# Patient Record
Sex: Female | Born: 1945 | Race: White | Hispanic: No | Marital: Married | Smoking: Former smoker
Health system: Southern US, Community
[De-identification: ages and names within clinical notes are randomized; demographics above are authoritative.]

## PROBLEM LIST (undated history)

## (undated) DIAGNOSIS — Z8711 Personal history of peptic ulcer disease: Secondary | ICD-10-CM

## (undated) DIAGNOSIS — I499 Cardiac arrhythmia, unspecified: Secondary | ICD-10-CM

## (undated) DIAGNOSIS — M1990 Unspecified osteoarthritis, unspecified site: Secondary | ICD-10-CM

## (undated) DIAGNOSIS — C210 Malignant neoplasm of anus, unspecified: Secondary | ICD-10-CM

## (undated) DIAGNOSIS — M8580 Other specified disorders of bone density and structure, unspecified site: Secondary | ICD-10-CM

## (undated) DIAGNOSIS — F419 Anxiety disorder, unspecified: Secondary | ICD-10-CM

## (undated) DIAGNOSIS — Z8719 Personal history of other diseases of the digestive system: Secondary | ICD-10-CM

## (undated) DIAGNOSIS — S82899A Other fracture of unspecified lower leg, initial encounter for closed fracture: Secondary | ICD-10-CM

## (undated) DIAGNOSIS — J449 Chronic obstructive pulmonary disease, unspecified: Secondary | ICD-10-CM

## (undated) DIAGNOSIS — W19XXXA Unspecified fall, initial encounter: Secondary | ICD-10-CM

## (undated) DIAGNOSIS — W5503XA Scratched by cat, initial encounter: Secondary | ICD-10-CM

## (undated) DIAGNOSIS — E7800 Pure hypercholesterolemia, unspecified: Secondary | ICD-10-CM

## (undated) DIAGNOSIS — R0600 Dyspnea, unspecified: Secondary | ICD-10-CM

## (undated) DIAGNOSIS — I4891 Unspecified atrial fibrillation: Secondary | ICD-10-CM

## (undated) DIAGNOSIS — S60511A Abrasion of right hand, initial encounter: Secondary | ICD-10-CM

## (undated) HISTORY — PX: VARICOSE VEIN SURGERY: SHX832

## (undated) HISTORY — DX: Malignant neoplasm of anus, unspecified: C21.0

## (undated) HISTORY — DX: Unspecified fall, initial encounter: W19.XXXA

## (undated) HISTORY — DX: Other specified disorders of bone density and structure, unspecified site: M85.80

## (undated) HISTORY — PX: DILATION AND CURETTAGE OF UTERUS: SHX78

---
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
## 1964-11-14 HISTORY — PX: BREAST CYST EXCISION: SHX579

## 1998-10-19 ENCOUNTER — Other Ambulatory Visit: Admission: RE | Admit: 1998-10-19 | Discharge: 1998-10-19 | Payer: Self-pay | Admitting: *Deleted

## 1999-10-06 ENCOUNTER — Encounter: Payer: Self-pay | Admitting: *Deleted

## 1999-10-06 ENCOUNTER — Encounter: Admission: RE | Admit: 1999-10-06 | Discharge: 1999-10-06 | Payer: Self-pay | Admitting: *Deleted

## 1999-11-24 ENCOUNTER — Other Ambulatory Visit: Admission: RE | Admit: 1999-11-24 | Discharge: 1999-11-24 | Payer: Self-pay | Admitting: *Deleted

## 2000-10-09 ENCOUNTER — Encounter: Payer: Self-pay | Admitting: *Deleted

## 2000-10-09 ENCOUNTER — Encounter: Admission: RE | Admit: 2000-10-09 | Discharge: 2000-10-09 | Payer: Self-pay | Admitting: *Deleted

## 2000-12-06 ENCOUNTER — Other Ambulatory Visit: Admission: RE | Admit: 2000-12-06 | Discharge: 2000-12-06 | Payer: Self-pay | Admitting: *Deleted

## 2001-08-08 ENCOUNTER — Ambulatory Visit (HOSPITAL_COMMUNITY): Admission: RE | Admit: 2001-08-08 | Discharge: 2001-08-08 | Payer: Self-pay | Admitting: Gastroenterology

## 2001-08-08 ENCOUNTER — Encounter (INDEPENDENT_AMBULATORY_CARE_PROVIDER_SITE_OTHER): Payer: Self-pay | Admitting: *Deleted

## 2001-11-21 ENCOUNTER — Encounter: Admission: RE | Admit: 2001-11-21 | Discharge: 2001-11-21 | Payer: Self-pay | Admitting: *Deleted

## 2001-11-21 ENCOUNTER — Encounter: Payer: Self-pay | Admitting: *Deleted

## 2001-12-26 ENCOUNTER — Other Ambulatory Visit: Admission: RE | Admit: 2001-12-26 | Discharge: 2001-12-26 | Payer: Self-pay | Admitting: *Deleted

## 2002-12-04 ENCOUNTER — Encounter: Payer: Self-pay | Admitting: *Deleted

## 2002-12-04 ENCOUNTER — Encounter: Admission: RE | Admit: 2002-12-04 | Discharge: 2002-12-04 | Payer: Self-pay | Admitting: *Deleted

## 2003-01-01 ENCOUNTER — Other Ambulatory Visit: Admission: RE | Admit: 2003-01-01 | Discharge: 2003-01-01 | Payer: Self-pay | Admitting: *Deleted

## 2003-11-15 DIAGNOSIS — C210 Malignant neoplasm of anus, unspecified: Secondary | ICD-10-CM

## 2003-11-15 HISTORY — DX: Malignant neoplasm of anus, unspecified: C21.0

## 2003-11-15 HISTORY — PX: ANUS SURGERY: SHX302

## 2004-02-07 ENCOUNTER — Ambulatory Visit (HOSPITAL_COMMUNITY): Admission: RE | Admit: 2004-02-07 | Discharge: 2004-02-07 | Payer: Self-pay | Admitting: Surgery

## 2004-02-08 ENCOUNTER — Encounter (INDEPENDENT_AMBULATORY_CARE_PROVIDER_SITE_OTHER): Payer: Self-pay | Admitting: Specialist

## 2004-02-09 ENCOUNTER — Ambulatory Visit: Admission: RE | Admit: 2004-02-09 | Discharge: 2004-04-16 | Payer: Self-pay | Admitting: Radiation Oncology

## 2004-02-09 ENCOUNTER — Encounter: Admission: RE | Admit: 2004-02-09 | Discharge: 2004-02-09 | Payer: Self-pay | Admitting: Surgery

## 2004-02-17 ENCOUNTER — Ambulatory Visit (HOSPITAL_COMMUNITY): Admission: RE | Admit: 2004-02-17 | Discharge: 2004-02-17 | Payer: Self-pay | Admitting: Oncology

## 2004-02-21 ENCOUNTER — Emergency Department (HOSPITAL_COMMUNITY): Admission: EM | Admit: 2004-02-21 | Discharge: 2004-02-21 | Payer: Self-pay | Admitting: Emergency Medicine

## 2004-02-23 ENCOUNTER — Ambulatory Visit (HOSPITAL_COMMUNITY): Admission: RE | Admit: 2004-02-23 | Discharge: 2004-02-23 | Payer: Self-pay | Admitting: Oncology

## 2004-03-15 ENCOUNTER — Ambulatory Visit (HOSPITAL_COMMUNITY): Admission: RE | Admit: 2004-03-15 | Discharge: 2004-03-15 | Payer: Self-pay | Admitting: Oncology

## 2004-03-16 ENCOUNTER — Inpatient Hospital Stay (HOSPITAL_COMMUNITY): Admission: EM | Admit: 2004-03-16 | Discharge: 2004-03-25 | Payer: Self-pay | Admitting: Oncology

## 2004-03-23 ENCOUNTER — Encounter (INDEPENDENT_AMBULATORY_CARE_PROVIDER_SITE_OTHER): Payer: Self-pay | Admitting: *Deleted

## 2004-05-11 ENCOUNTER — Ambulatory Visit: Admission: RE | Admit: 2004-05-11 | Discharge: 2004-05-11 | Payer: Self-pay | Admitting: Radiation Oncology

## 2004-05-21 ENCOUNTER — Encounter: Admission: RE | Admit: 2004-05-21 | Discharge: 2004-05-21 | Payer: Self-pay | Admitting: *Deleted

## 2004-06-22 ENCOUNTER — Ambulatory Visit (HOSPITAL_COMMUNITY): Admission: RE | Admit: 2004-06-22 | Discharge: 2004-06-22 | Payer: Self-pay | Admitting: Oncology

## 2004-06-25 ENCOUNTER — Ambulatory Visit (HOSPITAL_COMMUNITY): Admission: RE | Admit: 2004-06-25 | Discharge: 2004-06-25 | Payer: Self-pay | Admitting: Oncology

## 2004-07-07 ENCOUNTER — Other Ambulatory Visit: Admission: RE | Admit: 2004-07-07 | Discharge: 2004-07-07 | Payer: Self-pay | Admitting: *Deleted

## 2004-07-13 ENCOUNTER — Ambulatory Visit: Admission: RE | Admit: 2004-07-13 | Discharge: 2004-07-13 | Payer: Self-pay | Admitting: Radiation Oncology

## 2004-09-20 ENCOUNTER — Ambulatory Visit (HOSPITAL_COMMUNITY): Admission: RE | Admit: 2004-09-20 | Discharge: 2004-09-20 | Payer: Self-pay | Admitting: Oncology

## 2004-10-17 ENCOUNTER — Ambulatory Visit: Payer: Self-pay | Admitting: Oncology

## 2004-11-17 ENCOUNTER — Ambulatory Visit: Admission: RE | Admit: 2004-11-17 | Discharge: 2004-11-17 | Payer: Self-pay | Admitting: *Deleted

## 2005-01-17 ENCOUNTER — Ambulatory Visit: Payer: Self-pay | Admitting: Oncology

## 2005-03-23 ENCOUNTER — Ambulatory Visit: Admission: RE | Admit: 2005-03-23 | Discharge: 2005-03-23 | Payer: Self-pay | Admitting: *Deleted

## 2005-04-04 ENCOUNTER — Ambulatory Visit: Payer: Self-pay | Admitting: Oncology

## 2005-04-07 ENCOUNTER — Ambulatory Visit (HOSPITAL_COMMUNITY): Admission: RE | Admit: 2005-04-07 | Discharge: 2005-04-07 | Payer: Self-pay | Admitting: Oncology

## 2005-06-08 ENCOUNTER — Encounter: Admission: RE | Admit: 2005-06-08 | Discharge: 2005-06-08 | Payer: Self-pay | Admitting: Emergency Medicine

## 2005-07-05 ENCOUNTER — Ambulatory Visit: Payer: Self-pay | Admitting: Oncology

## 2005-07-08 ENCOUNTER — Other Ambulatory Visit: Admission: RE | Admit: 2005-07-08 | Discharge: 2005-07-08 | Payer: Self-pay | Admitting: *Deleted

## 2006-03-07 ENCOUNTER — Ambulatory Visit: Payer: Self-pay | Admitting: Oncology

## 2006-03-07 LAB — COMPREHENSIVE METABOLIC PANEL
ALT: 11 U/L (ref 0–40)
AST: 20 U/L (ref 0–37)
Albumin: 4.2 g/dL (ref 3.5–5.2)
Alkaline Phosphatase: 37 U/L — ABNORMAL LOW (ref 39–117)
BUN: 15 mg/dL (ref 6–23)
CO2: 25 mEq/L (ref 19–32)
Calcium: 8.9 mg/dL (ref 8.4–10.5)
Chloride: 106 mEq/L (ref 96–112)
Creatinine, Ser: 0.7 mg/dL (ref 0.4–1.2)
Glucose, Bld: 100 mg/dL — ABNORMAL HIGH (ref 70–99)
Potassium: 4.1 mEq/L (ref 3.5–5.3)
Sodium: 139 mEq/L (ref 135–145)
Total Bilirubin: 0.4 mg/dL (ref 0.3–1.2)
Total Protein: 6.6 g/dL (ref 6.0–8.3)

## 2006-03-07 LAB — CBC WITH DIFFERENTIAL/PLATELET
BASO%: 0.4 % (ref 0.0–2.0)
Basophils Absolute: 0 10*3/uL (ref 0.0–0.1)
EOS%: 2.3 % (ref 0.0–7.0)
Eosinophils Absolute: 0.2 10*3/uL (ref 0.0–0.5)
HCT: 39.3 % (ref 34.8–46.6)
HGB: 12.9 g/dL (ref 11.6–15.9)
LYMPH%: 11.5 % — ABNORMAL LOW (ref 14.0–48.0)
MCH: 27.7 pg (ref 26.0–34.0)
MCHC: 32.9 g/dL (ref 32.0–36.0)
MCV: 84.1 fL (ref 81.0–101.0)
MONO#: 0.4 10*3/uL (ref 0.1–0.9)
MONO%: 6.3 % (ref 0.0–13.0)
NEUT#: 5.3 10*3/uL (ref 1.5–6.5)
NEUT%: 79.5 % — ABNORMAL HIGH (ref 39.6–76.8)
Platelets: 366 10*3/uL (ref 145–400)
RBC: 4.68 10*6/uL (ref 3.70–5.32)
RDW: 15.4 % — ABNORMAL HIGH (ref 11.3–14.5)
WBC: 6.7 10*3/uL (ref 3.9–10.0)
lymph#: 0.8 10*3/uL — ABNORMAL LOW (ref 0.9–3.3)

## 2006-03-09 ENCOUNTER — Ambulatory Visit (HOSPITAL_COMMUNITY): Admission: RE | Admit: 2006-03-09 | Discharge: 2006-03-09 | Payer: Self-pay | Admitting: Oncology

## 2006-06-30 ENCOUNTER — Encounter: Admission: RE | Admit: 2006-06-30 | Discharge: 2006-06-30 | Payer: Self-pay | Admitting: Obstetrics & Gynecology

## 2006-07-12 ENCOUNTER — Other Ambulatory Visit: Admission: RE | Admit: 2006-07-12 | Discharge: 2006-07-12 | Payer: Self-pay | Admitting: Obstetrics & Gynecology

## 2006-09-22 ENCOUNTER — Ambulatory Visit: Payer: Self-pay | Admitting: Oncology

## 2006-09-26 LAB — CBC WITH DIFFERENTIAL/PLATELET
BASO%: 0.5 % (ref 0.0–2.0)
Basophils Absolute: 0 10*3/uL (ref 0.0–0.1)
EOS%: 0.8 % (ref 0.0–7.0)
Eosinophils Absolute: 0.1 10*3/uL (ref 0.0–0.5)
HCT: 37 % (ref 34.8–46.6)
HGB: 12.4 g/dL (ref 11.6–15.9)
LYMPH%: 16.2 % (ref 14.0–48.0)
MCH: 28.1 pg (ref 26.0–34.0)
MCHC: 33.4 g/dL (ref 32.0–36.0)
MCV: 84.1 fL (ref 81.0–101.0)
MONO#: 0.4 10*3/uL (ref 0.1–0.9)
MONO%: 5.8 % (ref 0.0–13.0)
NEUT#: 5.5 10*3/uL (ref 1.5–6.5)
NEUT%: 76.7 % (ref 39.6–76.8)
Platelets: 356 10*3/uL (ref 145–400)
RBC: 4.4 10*6/uL (ref 3.70–5.32)
RDW: 14.4 % (ref 11.3–14.5)
WBC: 7.2 10*3/uL (ref 3.9–10.0)
lymph#: 1.2 10*3/uL (ref 0.9–3.3)

## 2006-09-26 LAB — COMPREHENSIVE METABOLIC PANEL
ALT: 9 U/L (ref 0–35)
AST: 17 U/L (ref 0–37)
Albumin: 4.1 g/dL (ref 3.5–5.2)
Alkaline Phosphatase: 38 U/L — ABNORMAL LOW (ref 39–117)
BUN: 12 mg/dL (ref 6–23)
CO2: 26 mEq/L (ref 19–32)
Calcium: 9.2 mg/dL (ref 8.4–10.5)
Chloride: 107 mEq/L (ref 96–112)
Creatinine, Ser: 0.73 mg/dL (ref 0.40–1.20)
Glucose, Bld: 100 mg/dL — ABNORMAL HIGH (ref 70–99)
Potassium: 3.8 mEq/L (ref 3.5–5.3)
Sodium: 143 mEq/L (ref 135–145)
Total Bilirubin: 0.4 mg/dL (ref 0.3–1.2)
Total Protein: 6.7 g/dL (ref 6.0–8.3)

## 2007-03-23 ENCOUNTER — Ambulatory Visit: Payer: Self-pay | Admitting: Oncology

## 2007-03-27 LAB — CBC WITH DIFFERENTIAL/PLATELET
BASO%: 0.4 % (ref 0.0–2.0)
Basophils Absolute: 0 10*3/uL (ref 0.0–0.1)
EOS%: 1.9 % (ref 0.0–7.0)
Eosinophils Absolute: 0.1 10*3/uL (ref 0.0–0.5)
HCT: 40 % (ref 34.8–46.6)
HGB: 13.5 g/dL (ref 11.6–15.9)
LYMPH%: 16.1 % (ref 14.0–48.0)
MCH: 27.8 pg (ref 26.0–34.0)
MCHC: 33.8 g/dL (ref 32.0–36.0)
MCV: 82.1 fL (ref 81.0–101.0)
MONO#: 0.4 10*3/uL (ref 0.1–0.9)
MONO%: 6.4 % (ref 0.0–13.0)
NEUT#: 4.4 10*3/uL (ref 1.5–6.5)
NEUT%: 75.2 % (ref 39.6–76.8)
Platelets: 326 10*3/uL (ref 145–400)
RBC: 4.87 10*6/uL (ref 3.70–5.32)
RDW: 13.9 % (ref 11.3–14.5)
WBC: 5.8 10*3/uL (ref 3.9–10.0)
lymph#: 0.9 10*3/uL (ref 0.9–3.3)

## 2007-03-27 LAB — COMPREHENSIVE METABOLIC PANEL
ALT: 12 U/L (ref 0–35)
AST: 18 U/L (ref 0–37)
Albumin: 4.4 g/dL (ref 3.5–5.2)
Alkaline Phosphatase: 41 U/L (ref 39–117)
BUN: 13 mg/dL (ref 6–23)
CO2: 26 mEq/L (ref 19–32)
Calcium: 9.6 mg/dL (ref 8.4–10.5)
Chloride: 104 mEq/L (ref 96–112)
Creatinine, Ser: 0.78 mg/dL (ref 0.40–1.20)
Glucose, Bld: 76 mg/dL (ref 70–99)
Potassium: 4.8 mEq/L (ref 3.5–5.3)
Sodium: 140 mEq/L (ref 135–145)
Total Bilirubin: 0.5 mg/dL (ref 0.3–1.2)
Total Protein: 7 g/dL (ref 6.0–8.3)

## 2007-07-25 ENCOUNTER — Encounter: Admission: RE | Admit: 2007-07-25 | Discharge: 2007-07-25 | Payer: Self-pay | Admitting: Gastroenterology

## 2007-09-19 ENCOUNTER — Encounter: Admission: RE | Admit: 2007-09-19 | Discharge: 2007-09-19 | Payer: Self-pay | Admitting: Emergency Medicine

## 2007-09-21 ENCOUNTER — Ambulatory Visit: Payer: Self-pay | Admitting: Oncology

## 2007-09-25 LAB — COMPREHENSIVE METABOLIC PANEL
ALT: 15 U/L (ref 0–35)
AST: 24 U/L (ref 0–37)
Albumin: 3.9 g/dL (ref 3.5–5.2)
Alkaline Phosphatase: 46 U/L (ref 39–117)
BUN: 12 mg/dL (ref 6–23)
CO2: 27 mEq/L (ref 19–32)
Calcium: 9.3 mg/dL (ref 8.4–10.5)
Chloride: 105 mEq/L (ref 96–112)
Creatinine, Ser: 0.71 mg/dL (ref 0.40–1.20)
Glucose, Bld: 148 mg/dL — ABNORMAL HIGH (ref 70–99)
Potassium: 3.6 mEq/L (ref 3.5–5.3)
Sodium: 140 mEq/L (ref 135–145)
Total Bilirubin: 0.8 mg/dL (ref 0.3–1.2)
Total Protein: 6.8 g/dL (ref 6.0–8.3)

## 2007-09-25 LAB — CBC WITH DIFFERENTIAL/PLATELET
BASO%: 0.3 % (ref 0.0–2.0)
Basophils Absolute: 0 10*3/uL (ref 0.0–0.1)
EOS%: 0.7 % (ref 0.0–7.0)
Eosinophils Absolute: 0.1 10*3/uL (ref 0.0–0.5)
HCT: 37.4 % (ref 34.8–46.6)
HGB: 12.8 g/dL (ref 11.6–15.9)
LYMPH%: 16.6 % (ref 14.0–48.0)
MCH: 28.1 pg (ref 26.0–34.0)
MCHC: 34.3 g/dL (ref 32.0–36.0)
MCV: 81.8 fL (ref 81.0–101.0)
MONO#: 0.3 10*3/uL (ref 0.1–0.9)
MONO%: 4.1 % (ref 0.0–13.0)
NEUT#: 5.6 10*3/uL (ref 1.5–6.5)
NEUT%: 78.3 % — ABNORMAL HIGH (ref 39.6–76.8)
Platelets: 341 10*3/uL (ref 145–400)
RBC: 4.57 10*6/uL (ref 3.70–5.32)
RDW: 14.2 % (ref 11.3–14.5)
WBC: 7.1 10*3/uL (ref 3.9–10.0)
lymph#: 1.2 10*3/uL (ref 0.9–3.3)

## 2007-10-09 ENCOUNTER — Other Ambulatory Visit: Admission: RE | Admit: 2007-10-09 | Discharge: 2007-10-09 | Payer: Self-pay | Admitting: Obstetrics & Gynecology

## 2008-03-20 ENCOUNTER — Ambulatory Visit: Payer: Self-pay | Admitting: Oncology

## 2008-03-24 LAB — CBC WITH DIFFERENTIAL/PLATELET
BASO%: 0.1 % (ref 0.0–2.0)
Basophils Absolute: 0 10*3/uL (ref 0.0–0.1)
EOS%: 1.1 % (ref 0.0–7.0)
Eosinophils Absolute: 0.1 10*3/uL (ref 0.0–0.5)
HCT: 38.8 % (ref 34.8–46.6)
HGB: 13.1 g/dL (ref 11.6–15.9)
LYMPH%: 13.3 % — ABNORMAL LOW (ref 14.0–48.0)
MCH: 27.7 pg (ref 26.0–34.0)
MCHC: 33.8 g/dL (ref 32.0–36.0)
MCV: 82 fL (ref 81.0–101.0)
MONO#: 0.4 10*3/uL (ref 0.1–0.9)
MONO%: 6 % (ref 0.0–13.0)
NEUT#: 5.5 10*3/uL (ref 1.5–6.5)
NEUT%: 79.5 % — ABNORMAL HIGH (ref 39.6–76.8)
Platelets: 337 10*3/uL (ref 145–400)
RBC: 4.73 10*6/uL (ref 3.70–5.32)
RDW: 14 % (ref 11.3–14.5)
WBC: 6.9 10*3/uL (ref 3.9–10.0)
lymph#: 0.9 10*3/uL (ref 0.9–3.3)

## 2008-03-24 LAB — COMPREHENSIVE METABOLIC PANEL
ALT: 9 U/L (ref 0–35)
AST: 14 U/L (ref 0–37)
Albumin: 4.2 g/dL (ref 3.5–5.2)
Alkaline Phosphatase: 40 U/L (ref 39–117)
BUN: 18 mg/dL (ref 6–23)
CO2: 24 mEq/L (ref 19–32)
Calcium: 9.2 mg/dL (ref 8.4–10.5)
Chloride: 105 mEq/L (ref 96–112)
Creatinine, Ser: 0.91 mg/dL (ref 0.40–1.20)
Glucose, Bld: 86 mg/dL (ref 70–99)
Potassium: 4.2 mEq/L (ref 3.5–5.3)
Sodium: 140 mEq/L (ref 135–145)
Total Bilirubin: 0.5 mg/dL (ref 0.3–1.2)
Total Protein: 6.9 g/dL (ref 6.0–8.3)

## 2008-08-22 ENCOUNTER — Encounter: Admission: RE | Admit: 2008-08-22 | Discharge: 2008-08-22 | Payer: Self-pay | Admitting: Oncology

## 2008-09-22 ENCOUNTER — Ambulatory Visit: Payer: Self-pay | Admitting: Oncology

## 2008-09-24 LAB — COMPREHENSIVE METABOLIC PANEL
ALT: 13 U/L (ref 0–35)
AST: 20 U/L (ref 0–37)
Albumin: 3.9 g/dL (ref 3.5–5.2)
Alkaline Phosphatase: 41 U/L (ref 39–117)
BUN: 17 mg/dL (ref 6–23)
CO2: 27 mEq/L (ref 19–32)
Calcium: 9.4 mg/dL (ref 8.4–10.5)
Chloride: 103 mEq/L (ref 96–112)
Creatinine, Ser: 0.85 mg/dL (ref 0.40–1.20)
Glucose, Bld: 100 mg/dL — ABNORMAL HIGH (ref 70–99)
Potassium: 3.9 mEq/L (ref 3.5–5.3)
Sodium: 137 mEq/L (ref 135–145)
Total Bilirubin: 0.8 mg/dL (ref 0.3–1.2)
Total Protein: 6.9 g/dL (ref 6.0–8.3)

## 2008-09-24 LAB — CBC WITH DIFFERENTIAL/PLATELET
BASO%: 0.2 % (ref 0.0–2.0)
Basophils Absolute: 0 10*3/uL (ref 0.0–0.1)
EOS%: 0.8 % (ref 0.0–7.0)
Eosinophils Absolute: 0.1 10*3/uL (ref 0.0–0.5)
HCT: 40.3 % (ref 34.8–46.6)
HGB: 13.6 g/dL (ref 11.6–15.9)
LYMPH%: 12.9 % — ABNORMAL LOW (ref 14.0–48.0)
MCH: 27.9 pg (ref 26.0–34.0)
MCHC: 33.7 g/dL (ref 32.0–36.0)
MCV: 82.9 fL (ref 81.0–101.0)
MONO#: 0.4 10*3/uL (ref 0.1–0.9)
MONO%: 4.4 % (ref 0.0–13.0)
NEUT#: 6.6 10*3/uL — ABNORMAL HIGH (ref 1.5–6.5)
NEUT%: 81.7 % — ABNORMAL HIGH (ref 39.6–76.8)
Platelets: 381 10*3/uL (ref 145–400)
RBC: 4.86 10*6/uL (ref 3.70–5.32)
RDW: 14 % (ref 11.3–14.5)
WBC: 8.1 10*3/uL (ref 3.9–10.0)
lymph#: 1 10*3/uL (ref 0.9–3.3)

## 2008-10-15 ENCOUNTER — Other Ambulatory Visit: Admission: RE | Admit: 2008-10-15 | Discharge: 2008-10-15 | Payer: Self-pay | Admitting: Obstetrics & Gynecology

## 2009-03-20 ENCOUNTER — Ambulatory Visit: Payer: Self-pay | Admitting: Oncology

## 2009-03-24 LAB — CBC WITH DIFFERENTIAL/PLATELET
BASO%: 0.3 % (ref 0.0–2.0)
Basophils Absolute: 0 10*3/uL (ref 0.0–0.1)
EOS%: 1.1 % (ref 0.0–7.0)
Eosinophils Absolute: 0.1 10*3/uL (ref 0.0–0.5)
HCT: 38.8 % (ref 34.8–46.6)
HGB: 13 g/dL (ref 11.6–15.9)
LYMPH%: 17.3 % (ref 14.0–49.7)
MCH: 27.6 pg (ref 25.1–34.0)
MCHC: 33.5 g/dL (ref 31.5–36.0)
MCV: 82.4 fL (ref 79.5–101.0)
MONO#: 0.3 10*3/uL (ref 0.1–0.9)
MONO%: 5.1 % (ref 0.0–14.0)
NEUT#: 4.4 10*3/uL (ref 1.5–6.5)
NEUT%: 76.2 % (ref 38.4–76.8)
Platelets: 317 10*3/uL (ref 145–400)
RBC: 4.7 10*6/uL (ref 3.70–5.45)
RDW: 13.8 % (ref 11.2–14.5)
WBC: 5.8 10*3/uL (ref 3.9–10.3)
lymph#: 1 10*3/uL (ref 0.9–3.3)

## 2009-03-24 LAB — COMPREHENSIVE METABOLIC PANEL
ALT: 8 U/L (ref 0–35)
AST: 17 U/L (ref 0–37)
Albumin: 4.1 g/dL (ref 3.5–5.2)
Alkaline Phosphatase: 41 U/L (ref 39–117)
BUN: 16 mg/dL (ref 6–23)
CO2: 26 mEq/L (ref 19–32)
Calcium: 9.4 mg/dL (ref 8.4–10.5)
Chloride: 105 mEq/L (ref 96–112)
Creatinine, Ser: 0.89 mg/dL (ref 0.40–1.20)
Glucose, Bld: 77 mg/dL (ref 70–99)
Potassium: 4.1 mEq/L (ref 3.5–5.3)
Sodium: 140 mEq/L (ref 135–145)
Total Bilirubin: 0.5 mg/dL (ref 0.3–1.2)
Total Protein: 6.6 g/dL (ref 6.0–8.3)

## 2009-03-26 ENCOUNTER — Ambulatory Visit (HOSPITAL_COMMUNITY): Admission: RE | Admit: 2009-03-26 | Discharge: 2009-03-26 | Payer: Self-pay | Admitting: Oncology

## 2009-08-24 ENCOUNTER — Encounter: Admission: RE | Admit: 2009-08-24 | Discharge: 2009-08-24 | Payer: Self-pay | Admitting: Emergency Medicine

## 2010-09-17 ENCOUNTER — Encounter: Admission: RE | Admit: 2010-09-17 | Discharge: 2010-09-17 | Payer: Self-pay | Admitting: Oncology

## 2010-12-05 ENCOUNTER — Encounter: Payer: Self-pay | Admitting: Surgery

## 2011-01-03 ENCOUNTER — Other Ambulatory Visit: Payer: Self-pay | Admitting: Obstetrics and Gynecology

## 2011-01-03 ENCOUNTER — Other Ambulatory Visit: Payer: Self-pay | Admitting: Obstetrics & Gynecology

## 2011-01-03 DIAGNOSIS — N6459 Other signs and symptoms in breast: Secondary | ICD-10-CM

## 2011-01-10 ENCOUNTER — Ambulatory Visit
Admission: RE | Admit: 2011-01-10 | Discharge: 2011-01-10 | Disposition: A | Discharge status: Home / Self Care | Payer: Medicare Other | Source: Ambulatory Visit | Attending: Obstetrics & Gynecology | Admitting: Obstetrics & Gynecology

## 2011-01-10 ENCOUNTER — Other Ambulatory Visit: Payer: Self-pay | Admitting: Obstetrics & Gynecology

## 2011-01-10 DIAGNOSIS — N6459 Other signs and symptoms in breast: Secondary | ICD-10-CM

## 2011-04-01 NOTE — Procedures (Signed)
[place]  Patient:    Julie Mann, Julie Mann Visit Number: [id number] MRN: [id number]          Service Type: END Location: ENDO Attending Physician:  Rich Brave Dictated by:   Florencia Reasons, M.D. Proc. Date: 08/08/01 Admit Date:  08/08/2001   CC:         Oley Balm. Georgina Pillion, M.D.  Gloris Manchester. Lazarus Salines, M.D.  Andres Ege, M.D.   Procedure Report  PROCEDURE:  Upper endoscopy with biopsies.  INDICATIONS FOR PROCEDURE:  The patient is a 65 year old with long-standing reflux type symptoms which are somewhat nonspecific in character and have partially, but not fully, responding to the use of Prevacid after trying various other PPI agents.  The patient was taken off her Fosamax on a trial basis for several weeks to see if this would be associated with resolution of her residual GI tract symptomatology, but it did not seem to help at all.  The purpose of this procedure is to help exclude adverse sequela of reflux, and to look for any alternative sources of her dyspeptic symptoms.  FINDINGS:  A small amount of bile reflux.  DESCRIPTION OF PROCEDURE:  The nature, purpose, and risks of the procedure had been discussed with the patient who provided written consent.  Sedation was phentanyl 50 mcg and Versed 7.5 mg IV without arrhythmias or desaturations. The Olympus video endoscope was passed under direct vision.  The vocal cords and larynx looked normal.  At this time, I did not see any evidence of definite reflux laryngitis.  The esophagus was entered without difficulty and had entirely normal mucosa without evidence of reflux esophagitis, Barretts esophagus, varices, infection, or neoplasia.  No ring, stricture, or hiatal hernia was appreciated.  The stomach was entered.  It contained a small amount of bile. However, the gastric mucosa was not erythematous.  There was no evidence of gastritis, erosions, polyps, ulcers, or masses,  including retroflex view of the proximal stomach, which again failed to show any significant hiatal hernia from the inferior perspective.  The pylorus, duodenal bulb, and second duodenum looked normal.  The scope was removed from the patient, but then I decided to go back down and take antral biopsies to look for evidence of H. pylori infection and chronic gastritis, since the patient gives a remote history of a gastric ulcer, for which there was no evident scarring or other abnormality on todays exam. After obtaining several antral biopsies which were sent for routine pathology, the scope was removed from the patient again.  She tolerated the procedure well, and there were no apparent complications.  IMPRESSION:  Bile reflux, otherwise normal exam.  PLAN:  Await pathology on todays biopsies, in particular looking for history of H. pylori infection.  Initiate sucralfate 1 g b.i.d. to q.i.d. on an empty stomach on a trial basis to see if this helps her dyspeptic symptoms, which might be due to bile reflux gastritis. Dictated by:   Florencia Reasons, M.D. Attending Physician:  Rich Brave DD:  08/08/01 TD:  08/08/01 Job: [id number]

## 2011-04-01 NOTE — Discharge Summary (Signed)
NAME:  Julie Mann, CORLE.:  [id number]   MEDICAL RECORD NO.:  [id number]                   PATIENT TYPE:  INP   LOCATION:  0280                                 FACILITY:  [facility]   PHYSICIAN:  Valentino Hue. Magrinat, M.D.            DATE OF BIRTH:  10-22-1946   DATE OF ADMISSION:  03/16/2004  DATE OF DISCHARGE:  03/25/2004                                 DISCHARGE SUMMARY   DISCHARGE DIAGNOSES:  1. Uncontrolled diarrhea.  2. Fever and leukopenia.  3. Anal cancer.  4. Gastroesophageal reflux disease.  5. Osteoarthritis.  6. Edema secondary to low albumin.  7. A history of remote tobacco abuse with emphysema per film.  8. Status post varicose vein repair.  9. History of removal of basal cell skin cancer.  10.      History of removal of benign breast cyst.  11.      Multiple ALLERGIES and INTOLERANCES including ACYCLOVIR,     MACROLIDES, HORMONE REPLACEMENT THERAPY, IODINE, and SEPTRA.   PROCEDURES:  A flexible sigmoidoscopy, intravenous antibiotics, intravenous  hydration.   HOSPITAL COURSE:  The patient was admitted with uncontrolled diarrhea up to  15 to 20 times a day, all liquid.  She was initially dehydrated and she was  aggressively hydrated.  The diarrhea was treated maximally with octreotide,  Questran, Lomotil, and initially NPO diet and then clear liquid diet.  Nevertheless the diarrhea persisted until the day of discharge by which time  it is much diminished.   The origin of the diarrhea was felt to be most likely the patient's  radiation therapy and chemotherapy for anal cancer.  She had just completed  her second cycle of 5-FU and mitomycin C on March 12, 2004 and received her  final dose of radiation on March 11, 2004. However, the period of diarrhea  persisted beyond what was expected.  Multiple cultures were obtained  including Cryptosporidium and Giardia both of which are negative, C.  difficile x2 which was negative.  When the  diarrhea had not cleared by Mar 23, 2004 the patient was evaluated by GI and had an unprepped flexible  sigmoidoscopy under [facility].  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  Retroflexion was not performed.   The patient also had significant fevers in excess of 102 with absolute  leukopenia.  She was treated with Neupogen until the day before discharge.  At the time of discharge, the patient had been afebrile for approximately 36  hours and her neutrophil count had risen to 7.1.  Other labs at the time of  admission include a hemoglobin of 8.9, a platelet count of 98,000, a  potassium of 4.1 after a vigorous replacement the day before for  hypokalemia.  Creatinine was 0.7, calcium 7.7.   CONDITION ON DISCHARGE:  Greatly improved.   DISCHARGE MEDICATIONS:  1. She will be on Lomotil one to two  tablets after each diarrheal bowel     movement (she had no diarrhea overnight but she did have 2 small liquid     bowel movements this morning).  2. Potassium 10 mEq b.i.d. for 15 days (potassium level at discharge was     4.1).  3. She will take Ativan 0.5 mg at bedtime as needed.  4. She will take acidophilus either as a liquid, yogurt, or pills to     replenish her normal flora.  5. Pain management will be Tylenol or Aleve.   ACTIVITY:  Unrestricted.   DIET:  Unrestricted.   WOUND CARE:  Not applicable.   SPECIAL INSTRUCTIONS:  She will call for nausea, diarrhea, pain, or any  other problem.   FOLLOW UP:  She will call Dr. Dan Humphreys today for an appointment either May  13th or May 16th.  She will see me on the same day on an unscheduled basis  just to apprise me of the date when she will start her radiation treatment.  She will likely see me again a week from the time of this dictation.                                               Valentino Hue. Magrinat, M.D.    Ronna Polio  D:  03/25/2004  T:  03/25/2004  Job:  [id number]

## 2011-04-01 NOTE — Op Note (Signed)
NAME:  Julie Mann, Julie Mann NO.:  [id number]   MEDICAL RECORD NO.:  [id number]                   PATIENT TYPE:  INP   LOCATION:  0280                                 FACILITY:  [facility]   PHYSICIAN:  Petra Kuba, M.D.                 DATE OF BIRTH:  1946/08/25   DATE OF PROCEDURE:  03/23/2004  DATE OF DISCHARGE:                                 OPERATIVE REPORT   PROCEDURE:  Flexible sigmoidoscopy with biopsy.   INDICATION:  Diarrhea.  Consent was signed after risks, benefits, methods,  options thoroughly discussed.   MEDICINES USED:  1. Fentanyl 50 mcg.  2. Versed 6 mg.   DESCRIPTION OF PROCEDURE:  Rectal inspection is pertinent for the obvious  radiation changes.  Digital exam is slightly tender.  The upper video  endoscope was inserted by direct vision, easily advanced to 35 cm.  No  obvious sigmoid abnormality was seen.  The mucosa appeared normal.  We  elected not to advance any further due to looping at that junction.  No  stool was seen.  A few scattered sigmoid biopsies were obtained.  The scope  was withdrawn back to the rectum.  There was some very distal probable  radiation proctitis.  There was some very distal exudate which could be  washed and suctioned.  We elected not to retroflex.  Air was suctioned,  scope removed.  The patient tolerated the procedure well.  There was no  obvious immediate complication.   ENDOSCOPIC DIAGNOSES:  1. Very distal rectal radiation proctitis.  2. Otherwise, normal flexible sigmoidoscopy to 35 cm, status post sigmoid     biopsy.   PLAN:  1. Await pathology as well as cultures.  2. We will go ahead and try Carafate.  3. Consider Librax next possibly or maybe something like Rowasa     suppositories.  4. We will follow with you.                                               Petra Kuba, M.D.    MEM/MEDQ  D:  03/23/2004  T:  03/23/2004  Job:  [id number]   cc:   Valentino Hue. Magrinat, M.D.  [address] [facility]  [place]  [place] [address]  Fax: [phone]   Bernette Redbird, M.D.  [address] [place]  [place], [place] [address]  Fax: [phone]

## 2011-04-01 NOTE — Op Note (Signed)
NAME:  Julie Mann, Julie Mann.:  [id number]   MEDICAL RECORD NO.:  [id number]                   PATIENT TYPE:  AMB   LOCATION:  DAY                                  FACILITY:  [facility]   PHYSICIAN:  Sandria Bales. Ezzard Standing, M.D.               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   PROCEDURE:  Examination under anesthesia, rectal/anal ultrasound, biopsy of  ulcer.   SURGEON:  Sandria Bales. Ezzard Standing, M.D.   INDICATIONS FOR PROCEDURE:  Ms. Estelle is a 65 year old white female who  sees Dr. Lajean Manes as her primary care physician, Dr. Katherine Mantle  from a GYN standpoint, had a colonoscopy by Dr. Nadine Counts Buccini.  Dr. Matthias Hughs  removed some benign colonic polyps but on exiting the rectum/anus biopsied  an area which came back with poorly differentiated carcinoma ready by Dr.  Nicky Pugh.   In speaking with Dr. Laureen Ochs, the biopsies were small and he suggested further  biopsies or tissue for definitive diagnosis.   The patient now comes for examination under anesthesia with rectal/anal  biopsy.  The indications and potential complications were explained to the  patient.  The potential complications including bleeding and infection.   The patient placed in a lithotomy position after general LMA anesthesia.  I  first did a rectal ultrasound with the balloon tip and then I did an anal  ultrasound. I could see loss of planes between the internal and external  anal sphincters, but I really could not measure a depth of this tumor. There  was no real rectal component to the tumor.   On examination, the patient has an approximately 2-2 1/2 cm ulcerated nodule  at her left lateral rectum which runs from about the 3 to 4 o'clock position  with her in lithotomy position.  This by gross exam does appear to be more  based on the anal side than the rectal side.  She had a skin tag which I cut  off below this. She had actually mentioned this to me preoperatively and  said this had been bothering some.  I then did two biopsies of about 1 cm  each of the base of the rectal ulcer.   This was sent for permanent pathology.   The wound was then bovied. I washed for five minutes to make sure the  bleeding stopped.  I injected about 10 mL of 0.25% Marcaine plain around the  ulcer. I used a piece of Gelfoam covered with __________ ointment.  She will  be discharged home today. She has appointments with Dr. Dan Humphreys and Dr.  Magrinot to discuss further treatment.                                               Sandria Bales. Ezzard Standing, M.D.    DHN/MEDQ  D:  02/07/2004  T:  02/07/2004  Job:  [id number]   cc:   Oley Balm. Georgina Pillion, M.D.  [address]  [place]  [place] [address]  Fax: [phone]   Wynn Banker, M.D.  [address] [facility]  [place]  [place] [address]  Fax: [phone]   Valentino Hue. Magrinat, M.D.  [address] [facility]  [place]  [place] [address]  Fax: [phone]   Andres Ege, M.D.  [address]  [place]  [place] [address]  Fax: [phone]   Bernette Redbird, M.D.  [address] [place]  [place], [place] [address]  Fax: [phone]

## 2011-04-01 NOTE — H&P (Signed)
NAME:  CRISTELLA, STIVER.:  [id number]   MEDICAL RECORD NO.:  [id number]                   PATIENT TYPE:  AMB   LOCATION:  DAY                                  FACILITY:  [facility]   PHYSICIAN:  Sandria Bales. Ezzard Standing, M.D.               DATE OF BIRTH:  Feb 12, 1946   DATE OF ADMISSION:  DATE OF DISCHARGE:                                HISTORY & PHYSICAL   HISTORY OF PRESENT ILLNESS:  Ms. Julie Mann is a 65 year old white female who  has been in otherwise good health without really any specific complaints.  I  think she had a little bit of a tag in her rectum and maybe some blood in  her stool, and underwent a colonoscopy by Dr. Nadine Counts Buccini.   Dr. Matthias Hughs found several benign polyps in the colon.  However, on removing  the scope through the rectum/anus, biopsied an area.   The biopsies on this showed a poorly differentiated carcinoma  _________smear.  He felt the specimens were too small to characterize  whether this was a carcinoma of the rectum, i.e., adenocarcinoma or  carcinoma of the anus, i.e., squamous cell, but he had favored a squamous  cell carcinoma.   The patient had an anal pack down around the rectum.  There was occasional  blood in her stool, but she has had no change in bowel habits.  She denies  any history of peptic ulcer disease, liver disease, pancreatic disease, or  colon disease.   Neurologic:  She has occasional headaches, but no significant past medical  history.   ALLERGIES:  1. ERYTHROMYCIN which causes disorientation.  2. Z-PACK which causes disorientation.  3. IV CONTRAST.   MEDICATIONS:  1. Flomax.  2. Multivitamins.   PAST SURGICAL HISTORY:  1. Varicose vein surgery about 1998.  2. Right breast cyst removed some 30 years ago.   REVIEW OF SYSTEMS:  NEUROLOGIC:  No seizure or loss of consciousness,  occasional headache.  PULMONARY:  She smoked, I think has quit now.  She  smoked for about 30+ years.  CARDIAC:  She has no  chest pain or angina on  cardiac evaluation.  GASTROINTESTINAL:  See history of present illness.  She  does have occasional constipation and had a colonoscopy in March 2005, by  Dr. Matthias Hughs.  UROLOGIC:  There is no kidney stones or kidney infections.  GYNECOLOGIC:  She is post-menopausal.  She has one son.   PHYSICAL EXAMINATION:  VITAL SIGNS:  Her height is 66 inches, her weight is  120 pounds, her pulse is 78.  GENERAL:  She is a well-nourished, thin, white female, alert and cooperative  with physical examination.  HEENT:  Unremarkable.  NECK:  Supple, no mass or thyromegaly.  LUNGS:  Clear to auscultation with symmetric breath sounds.  HEART:  Regular rate and rhythm.  There is no murmur or rub.  ABDOMEN:  Benign.  She has no organomegaly, no hernia, no mass.  RECTAL:  Will be described in my operative note when I examine her under  anesthesia, please refer to this.  EXTREMITIES:  She has __________all four extremities.  NEUROLOGIC:  Grossly intact.   LABORATORY DATA:  Hemoglobin is 13, hematocrit 41, white blood cell count  7800.  Sodium 140, potassium 3.7, chloride 106, CO2 27, glucose of 97.  Her  liver functions were all within normal limits.  Chest x-ray showed some COPD  changes.   IMPRESSION AND PLAN:  1. 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  The plan is examination     under anesthesia with a rectal/anal ultrasound and repeat her biopsies of     the rectal mass.  2. History of smoking.  She quit about five years ago.                                                Sandria Bales. Ezzard Standing, M.D.    DHN/MEDQ  D:  02/07/2004  T:  02/07/2004  Job:  [id number]   cc:   Oley Balm. Georgina Pillion, M.D.  [address]  [place]  [place] [address]  Fax: [phone]   Bernette Redbird, M.D.  [address] [place]  [place], [place] [address]  Fax: [phone]   Andres Ege, M.D.  [address]  [place]  [place] [address]  Fax: [phone]   Valentino Hue. Magrinat,  M.D.  [address] [facility]  [place]  [place] [address]  Fax: [phone]   Wynn Banker, M.D.  [address] - [facility]  [place]  [place] [address]  Fax: [phone]

## 2011-04-01 NOTE — H&P (Signed)
NAME:  Julie Mann, Julie Mann.:  [id number]   MEDICAL RECORD NO.:  [id number]                   PATIENT TYPE:  INP   LOCATION:  0280                                 FACILITY:  [facility]   PHYSICIAN:  Valentino Hue. Magrinat, M.D.            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  Her first cycle of 5-FU and  mitomycin-C was started on February 17, 2004, and her second cycle was started  on March 08, 2004.  She completed that on March 12, 2004.   She has been having progressive problems with diarrhea, and finally for the  last 24 hours has had copious amounts of liquid stools, mixed with some  blood.  She has been unable to keep herself hydrated orally, and even though  we have given her some intravenous fluids here, she is going to require  admission for hydration, control of the diarrhea, and further evaluation.   PAST MEDICAL HISTORY:  1. GERD.  2. Osteoarthritis.  3. Osteopenia.  4. Remote history of tobacco abuse.  5. Status post removal of a basal cell skin cancer.  6. History of varicose vein repair.  7. Status post removal of a benign breast cyst.   ALLERGIES:  1. ACYCLOVIR.  2. MACROLIDE.  3. HORMONE REPLACEMENT THERAPY.  4. IODINE, gets hives.   MEDICATIONS:  1. Fosamax.  2. Cod liver oil one a day.  3. Multivitamins.  4. Nicorette.  5. Lorazepam.  6. Compazine.   REVIEW OF SYSTEMS:  In addition to the diarrhea which is described above,  she has been having some shaking chills.  She is not sure whether or not  these are accompanied by fever because she has not been taking her  temperature.  There have been no unusual headaches, visual changes, taste  alteration, nausea, or vomiting.  She has been tired, but not outside of the  range of what is expected given her treatment.  There has been no cough,  phlegm production, pleurisy, or shortness of breath.  No palpitations, chest  pain, or pressure.  No problems with her urine.   PHYSICAL EXAMINATION:  GENERAL:  She is a middle-aged white female.  VITAL SIGNS:  Temperature of 97.2, pulse 76, respiratory rate 20, blood  pressure 107/75.  HEENT:  The sclerae are nonicteric.  The oropharynx is clear.  NECK:  Supple.  I do not palpate any peripheral adenopathy.  LUNGS:  No crackles or wheezes.  HEART:  Regular rate and rhythm.  No murmur appreciated.  ABDOMEN:  Soft, nontender, positive bowel sounds.  MUSCULOSKELETAL:  No focal spinal tenderness, no peripheral edema.  NEUROLOGIC:  Nonfocal.   LABORATORY DATA:  Shows a white blood cell count of 2, absolute neutrophil  count of 1.5, hemoglobin 11.4, platelets 169,000.  Albumin 2.7, alkaline  phosphatase 31, total bilirubin 0.4, normal transaminases, normal creatinine  at 0.7.  Potassium is 3.6, sodium 133.   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   The plan is to hold the radiation until the current problems resolve.  She  will be admitted for hydration, diarrhea control, and further evaluation of  the rigors.                                               Valentino Hue. Magrinat, M.D.    Ronna Polio  D:  03/16/2004  T:  03/16/2004  Job:  [id number]   cc:   Sandria Bales. Ezzard Standing, M.D.  [address]  [place]  [place] [address]  Fax: [phone]   Oley Balm. Georgina Pillion, M.D.  [address]  [place]  [place] [address]  Fax: [phone]   Wynn Banker, M.D.  [address] - [facility]  [place]  [place] [address]   Fax: [phone]   Andres Ege, M.D.  [address]  [place]  [place] [address]  Fax: [phone]   Bernette Redbird, M.D.  [address] [place]  [place], [place] [address]  Fax: [phone]

## 2011-10-31 ENCOUNTER — Other Ambulatory Visit: Payer: Self-pay | Admitting: Obstetrics & Gynecology

## 2011-10-31 DIAGNOSIS — Z1231 Encounter for screening mammogram for malignant neoplasm of breast: Secondary | ICD-10-CM

## 2011-11-30 ENCOUNTER — Ambulatory Visit
Admission: RE | Admit: 2011-11-30 | Discharge: 2011-11-30 | Disposition: A | Discharge status: Home / Self Care | Payer: Medicare Other | Source: Ambulatory Visit | Attending: Obstetrics & Gynecology | Admitting: Obstetrics & Gynecology

## 2011-11-30 DIAGNOSIS — Z1231 Encounter for screening mammogram for malignant neoplasm of breast: Secondary | ICD-10-CM

## 2012-05-29 ENCOUNTER — Other Ambulatory Visit: Payer: Self-pay | Admitting: Rehabilitation

## 2012-05-29 DIAGNOSIS — M549 Dorsalgia, unspecified: Secondary | ICD-10-CM

## 2012-05-30 ENCOUNTER — Ambulatory Visit
Admission: RE | Admit: 2012-05-30 | Discharge: 2012-05-30 | Disposition: A | Discharge status: Home / Self Care | Payer: Medicare Other | Source: Ambulatory Visit | Attending: Rehabilitation | Admitting: Rehabilitation

## 2012-05-30 DIAGNOSIS — M549 Dorsalgia, unspecified: Secondary | ICD-10-CM

## 2012-06-04 ENCOUNTER — Other Ambulatory Visit: Payer: Medicare Other

## 2012-12-10 ENCOUNTER — Other Ambulatory Visit: Payer: Self-pay | Admitting: Geriatric Medicine

## 2012-12-10 DIAGNOSIS — Z1231 Encounter for screening mammogram for malignant neoplasm of breast: Secondary | ICD-10-CM

## 2012-12-12 ENCOUNTER — Other Ambulatory Visit: Payer: Self-pay | Admitting: Obstetrics & Gynecology

## 2012-12-12 ENCOUNTER — Other Ambulatory Visit: Payer: Self-pay | Admitting: Geriatric Medicine

## 2012-12-12 DIAGNOSIS — M8580 Other specified disorders of bone density and structure, unspecified site: Secondary | ICD-10-CM

## 2013-01-08 ENCOUNTER — Ambulatory Visit
Admission: RE | Admit: 2013-01-08 | Discharge: 2013-01-08 | Disposition: A | Discharge status: Home / Self Care | Payer: Medicare Other | Source: Ambulatory Visit | Attending: Geriatric Medicine | Admitting: Geriatric Medicine

## 2013-01-08 ENCOUNTER — Ambulatory Visit
Admission: RE | Admit: 2013-01-08 | Discharge: 2013-01-08 | Disposition: A | Discharge status: Home / Self Care | Payer: Medicare Other | Source: Ambulatory Visit | Attending: Obstetrics & Gynecology | Admitting: Obstetrics & Gynecology

## 2013-01-08 DIAGNOSIS — Z1231 Encounter for screening mammogram for malignant neoplasm of breast: Secondary | ICD-10-CM

## 2013-01-08 DIAGNOSIS — M8580 Other specified disorders of bone density and structure, unspecified site: Secondary | ICD-10-CM

## 2013-02-08 ENCOUNTER — Other Ambulatory Visit: Payer: Self-pay | Admitting: Gastroenterology

## 2013-02-19 ENCOUNTER — Encounter: Payer: Self-pay | Admitting: Obstetrics & Gynecology

## 2013-02-19 ENCOUNTER — Ambulatory Visit (INDEPENDENT_AMBULATORY_CARE_PROVIDER_SITE_OTHER): Payer: Medicare Other | Admitting: Obstetrics & Gynecology

## 2013-02-19 VITALS — BP 130/78 | Ht 67.75 in | Wt 136.4 lb

## 2013-02-19 DIAGNOSIS — Z01419 Encounter for gynecological examination (general) (routine) without abnormal findings: Secondary | ICD-10-CM

## 2013-02-19 MED ORDER — LORAZEPAM 0.5 MG PO TABS: 0.5000 mg | ORAL_TABLET | Freq: Three times a day (TID) | ORAL | Status: DC

## 2013-02-19 MED ORDER — IBUPROFEN 600 MG PO TABS: 600.0000 mg | ORAL_TABLET | Freq: Three times a day (TID) | ORAL | Status: DC | PRN

## 2013-02-19 NOTE — Progress Notes (Addendum)
67 y.o. [id number] MarriedCaucasianF here for annual exam.  No vaginal bleeding.  Doing well.    Patient's last menstrual period was 11/14/1992.          Sexually active: yes  The current method of family planning is post menopausal status.    Exercising: yes  walking, horse riding, yoga, and pilates Smoker:  no  Health Maintenance: Pap:  01/24/12 WNL MMG:  01/08/13 normal Colonoscopy:  3/14 repeat in 3 years (Dr. Blenda Nicely results, repeat 2 yrs. (03/14/13 [facility]) BMD:   01/08/13 worst t score -1.4 hip TDaP:  Up to date per patient Labs: with Dr. Pete Glatter 10/13.  Has follow up this year.   reports that she has quit smoking. She does not have any smokeless tobacco history on file. She reports that she does not drink alcohol or use illicit drugs.  Past Medical History  Diagnosis Date  . Osteopenia   . 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 Leg vein stripping      left leg    No current outpatient prescriptions on file.   No current facility-administered medications for this visit.    Family History  Problem Relation Age of Onset  . Breast cancer Maternal Aunt   . Breast cancer Maternal Grandmother     ROS:  Pertinent items are noted in HPI.  Otherwise, a comprehensive ROS was negative.  Exam:   LMP 11/14/1992  BP 130/78  Ht 5' 7 3/4" Ht Readings from Last 3 Encounters:  No data found for Ht    General appearance: alert, cooperative and appears stated age Head: Normocephalic, without obvious abnormality, atraumatic Neck: no adenopathy, supple, symmetrical, trachea midline and thyroid normal to inspection and palpation Lungs: clear to auscultation bilaterally Breasts: normal appearance, no masses or tenderness Heart: regular rate and rhythm Abdomen: soft, non-tender; bowel sounds normal; no masses,  no organomegaly Extremities: extremities normal, atraumatic, no cyanosis or edema Skin: Skin color,  texture, turgor normal. No rashes or lesions Lymph nodes: Cervical, supraclavicular, and axillary nodes normal. No abnormal inguinal nodes palpated Neurologic: Grossly normal   Pelvic: External genitalia:  no lesions              Urethra:  normal appearing urethra with no masses, tenderness or lesions              Bartholins and Skenes: normal                 Vagina: normal appearing vagina with normal color and discharge, no lesions, atrophic              Cervix: no lesions              Pap taken: yes Bimanual Exam:  Uterus:  normal size, contour, position, consistency, mobility, non-tender              Adnexa: normal adnexa               Rectovaginal: Confirms               Anus:  Skin with radiation changes, no change in last year, decreased tone  A:  Well Woman with normal exam H/O SCC of anus 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

## 2013-02-19 NOTE — Patient Instructions (Signed)

## 2013-02-21 LAB — IPS PAP SMEAR ONLY

## 2013-09-23 ENCOUNTER — Other Ambulatory Visit: Payer: Self-pay | Admitting: Obstetrics & Gynecology

## 2013-09-23 NOTE — Telephone Encounter (Signed)
eScribe request for refill on Ibuprofen 600mg #60 , Lorzepam 0.5mg #30 Last filled - 06-10-13 Last AEX - 02-19-13 Next AEX -  Patient requesting increase on strength of Lorazepam to 1mg 

## 2013-09-23 NOTE — Telephone Encounter (Signed)
Tresa Endo, Can you call in the Ativan for Mrs. Mcquilkin?  1mg  po q day prn.  #30/2 RF.  Thanks.  I signed the ibuprofen order.  Amanda sent this to me Monday and I don't want her to have to wait for this if possible.  Thanks.

## 2013-09-24 NOTE — Telephone Encounter (Signed)
RX for ativan with increased dose to 1mg  called to [facility] pharmacy.

## 2014-01-13 ENCOUNTER — Other Ambulatory Visit: Payer: Self-pay

## 2014-01-13 DIAGNOSIS — Z1231 Encounter for screening mammogram for malignant neoplasm of breast: Secondary | ICD-10-CM

## 2014-01-23 ENCOUNTER — Ambulatory Visit: Payer: Medicare Other

## 2014-01-30 ENCOUNTER — Ambulatory Visit
Admission: RE | Admit: 2014-01-30 | Discharge: 2014-01-30 | Disposition: A | Discharge status: Home / Self Care | Payer: Medicare Other | Source: Ambulatory Visit

## 2014-01-30 DIAGNOSIS — Z1231 Encounter for screening mammogram for malignant neoplasm of breast: Secondary | ICD-10-CM

## 2014-02-24 DIAGNOSIS — W5503XA Scratched by cat, initial encounter: Secondary | ICD-10-CM

## 2014-02-24 DIAGNOSIS — S60511A Abrasion of right hand, initial encounter: Secondary | ICD-10-CM

## 2014-02-24 HISTORY — DX: Abrasion of right hand, initial encounter: S60.511A

## 2014-02-24 HISTORY — DX: Scratched by cat, initial encounter: W55.03XA

## 2014-02-27 ENCOUNTER — Observation Stay (HOSPITAL_COMMUNITY)
Admission: EM | Admit: 2014-02-27 | Discharge: 2014-02-28 | Disposition: A | Discharge status: Home / Self Care | Payer: Medicare Other | Attending: Orthopedic Surgery | Admitting: Orthopedic Surgery

## 2014-02-27 ENCOUNTER — Encounter (HOSPITAL_COMMUNITY): Payer: Self-pay | Admitting: Emergency Medicine

## 2014-02-27 DIAGNOSIS — M949 Disorder of cartilage, unspecified: Secondary | ICD-10-CM

## 2014-02-27 DIAGNOSIS — W5501XA Bitten by cat, initial encounter: Secondary | ICD-10-CM

## 2014-02-27 DIAGNOSIS — L03113 Cellulitis of right upper limb: Secondary | ICD-10-CM

## 2014-02-27 DIAGNOSIS — M899 Disorder of bone, unspecified: Secondary | ICD-10-CM | POA: Insufficient documentation

## 2014-02-27 DIAGNOSIS — L089 Local infection of the skin and subcutaneous tissue, unspecified: Secondary | ICD-10-CM

## 2014-02-27 DIAGNOSIS — S61409A Unspecified open wound of unspecified hand, initial encounter: Secondary | ICD-10-CM | POA: Insufficient documentation

## 2014-02-27 DIAGNOSIS — Z9221 Personal history of antineoplastic chemotherapy: Secondary | ICD-10-CM | POA: Insufficient documentation

## 2014-02-27 DIAGNOSIS — Z923 Personal history of irradiation: Secondary | ICD-10-CM | POA: Insufficient documentation

## 2014-02-27 DIAGNOSIS — Z87891 Personal history of nicotine dependence: Secondary | ICD-10-CM | POA: Insufficient documentation

## 2014-02-27 DIAGNOSIS — IMO0001 Reserved for inherently not codable concepts without codable children: Secondary | ICD-10-CM | POA: Insufficient documentation

## 2014-02-27 DIAGNOSIS — L02519 Cutaneous abscess of unspecified hand: Principal | ICD-10-CM | POA: Insufficient documentation

## 2014-02-27 DIAGNOSIS — S61451A Open bite of right hand, initial encounter: Secondary | ICD-10-CM

## 2014-02-27 DIAGNOSIS — Z85048 Personal history of other malignant neoplasm of rectum, rectosigmoid junction, and anus: Secondary | ICD-10-CM | POA: Insufficient documentation

## 2014-02-27 DIAGNOSIS — S61459A Open bite of unspecified hand, initial encounter: Secondary | ICD-10-CM | POA: Diagnosis present

## 2014-02-27 DIAGNOSIS — L03119 Cellulitis of unspecified part of limb: Principal | ICD-10-CM

## 2014-02-27 HISTORY — DX: Personal history of peptic ulcer disease: Z87.11

## 2014-02-27 HISTORY — DX: Unspecified osteoarthritis, unspecified site: M19.90

## 2014-02-27 HISTORY — DX: Pure hypercholesterolemia, unspecified: E78.00

## 2014-02-27 HISTORY — DX: Abrasion of right hand, initial encounter: S60.511A

## 2014-02-27 HISTORY — DX: Personal history of other diseases of the digestive system: Z87.19

## 2014-02-27 HISTORY — DX: Scratched by cat, initial encounter: W55.03XA

## 2014-02-27 LAB — CBC WITH DIFFERENTIAL/PLATELET
Basophils Absolute: 0 10*3/uL (ref 0.0–0.1)
Basophils Relative: 0 % (ref 0–1)
Eosinophils Absolute: 0 10*3/uL (ref 0.0–0.7)
Eosinophils Relative: 0 % (ref 0–5)
HCT: 41.9 % (ref 36.0–46.0)
Hemoglobin: 14 g/dL (ref 12.0–15.0)
Lymphocytes Relative: 12 % (ref 12–46)
Lymphs Abs: 1.6 10*3/uL (ref 0.7–4.0)
MCH: 27.7 pg (ref 26.0–34.0)
MCHC: 33.4 g/dL (ref 30.0–36.0)
MCV: 82.8 fL (ref 78.0–100.0)
Monocytes Absolute: 0.7 10*3/uL (ref 0.1–1.0)
Monocytes Relative: 6 % (ref 3–12)
Neutro Abs: 10.5 10*3/uL — ABNORMAL HIGH (ref 1.7–7.7)
Neutrophils Relative %: 82 % — ABNORMAL HIGH (ref 43–77)
Platelets: 321 10*3/uL (ref 150–400)
RBC: 5.06 MIL/uL (ref 3.87–5.11)
RDW: 14.5 % (ref 11.5–15.5)
WBC: 12.9 10*3/uL — ABNORMAL HIGH (ref 4.0–10.5)

## 2014-02-27 MED ORDER — METHOCARBAMOL 500 MG PO TABS: 500.0000 mg | ORAL_TABLET | Freq: Four times a day (QID) | ORAL | Status: DC | PRN

## 2014-02-27 MED ORDER — VITAMIN C 500 MG PO TABS
1000.0000 mg | ORAL_TABLET | Freq: Every day | ORAL | Status: DC
  Administered 2014-02-28: 1000 mg via ORAL
  Filled 2014-02-27 (×2): qty 2

## 2014-02-27 MED ORDER — DOCUSATE SODIUM 100 MG PO CAPS
100.0000 mg | ORAL_CAPSULE | Freq: Two times a day (BID) | ORAL | Status: DC
  Administered 2014-02-28: 100 mg via ORAL
  Filled 2014-02-27 (×3): qty 1

## 2014-02-27 MED ORDER — MORPHINE SULFATE 2 MG/ML IJ SOLN: 1.0000 mg | INTRAMUSCULAR | Status: DC | PRN

## 2014-02-27 MED ORDER — KCL IN DEXTROSE-NACL 20-5-0.45 MEQ/L-%-% IV SOLN
INTRAVENOUS | Status: DC
  Administered 2014-02-27: 10 mL/h via INTRAVENOUS
  Filled 2014-02-27: qty 1000

## 2014-02-27 MED ORDER — ONDANSETRON HCL 4 MG/2ML IJ SOLN: 4.0000 mg | Freq: Four times a day (QID) | INTRAMUSCULAR | Status: DC | PRN

## 2014-02-27 MED ORDER — HYDROCODONE-ACETAMINOPHEN 5-325 MG PO TABS: 1.0000 | ORAL_TABLET | ORAL | Status: DC | PRN

## 2014-02-27 MED ORDER — LORAZEPAM 1 MG PO TABS
0.5000 mg | ORAL_TABLET | Freq: Three times a day (TID) | ORAL | Status: DC | PRN
  Administered 2014-02-27: 1 mg via ORAL
  Filled 2014-02-27: qty 1

## 2014-02-27 MED ORDER — ONDANSETRON HCL 4 MG PO TABS: 4.0000 mg | ORAL_TABLET | Freq: Four times a day (QID) | ORAL | Status: DC | PRN

## 2014-02-27 MED ORDER — OXYCODONE-ACETAMINOPHEN 5-325 MG PO TABS: 1.0000 | ORAL_TABLET | ORAL | Status: DC | PRN

## 2014-02-27 MED ORDER — SODIUM CHLORIDE 0.9 % IV SOLN
3.0000 g | Freq: Four times a day (QID) | INTRAVENOUS | Status: DC
  Administered 2014-02-27 – 2014-02-28 (×5): 3 g via INTRAVENOUS
  Filled 2014-02-27 (×7): qty 3

## 2014-02-27 MED ORDER — ADULT MULTIVITAMIN W/MINERALS CH
1.0000 | ORAL_TABLET | Freq: Every day | ORAL | Status: DC
  Administered 2014-02-28: 1 via ORAL
  Filled 2014-02-27: qty 1

## 2014-02-27 MED ORDER — DIPHENHYDRAMINE HCL 25 MG PO CAPS: 25.0000 mg | ORAL_CAPSULE | Freq: Four times a day (QID) | ORAL | Status: DC | PRN

## 2014-02-27 MED ORDER — METHOCARBAMOL 100 MG/ML IJ SOLN
500.0000 mg | Freq: Four times a day (QID) | INTRAVENOUS | Status: DC | PRN
  Filled 2014-02-27: qty 5

## 2014-02-27 NOTE — ED Notes (Signed)
Pharmacy called for Patient UNASYN not in pyxis down here.

## 2014-02-27 NOTE — ED Notes (Signed)
Pt sent from Dr. Felipa Eth for cellulitis of right hand from cat bite. He has spoken with Dr. Marga Hoots who has provided his phone number and is to be called upon placement in room. Pt is a x 4. In NAD

## 2014-02-27 NOTE — H&P (Signed)
Julie Mann is an 68 y.o. female.   Chief Complaint: right hand swelling and redness HPI: Pt sustained cat bite three days ago.  Pt has been followed by PCP. Pt with worsening redness and swelling and sent to ed for evaluation Pt without systemic signs of infection No other complaints Minimal pain. Only bite on right hand No other complaints  Past Medical History  Diagnosis Date  . Osteopenia   . 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 Leg vein stripping      left leg    Family History  Problem Relation Age of Onset  . Breast cancer Maternal Aunt   . Breast cancer Maternal Grandmother    Social History:  reports that she has quit smoking. Her smoking use included Cigarettes. She has a 30 pack-year smoking history. She quit smokeless tobacco use about 11 years ago. She reports that she does not drink alcohol or use illicit drugs.  Allergies:  Allergies  Allergen Reactions  . Doxycycline Other (See Comments)    Dizziness and tingling  . Erythromycin Other (See Comments)    hallucinations  . Ivp Dye [Iodinated Diagnostic Agents]   . Keflex [Cephalexin] Other (See Comments)    Hallucinations.  . Premarin [Conjugated Estrogens]   . Provera [Medroxyprogesterone Acetate]   . Zithromax [Azithromycin] Other (See Comments)    hallucinations  . Zoloft [Sertraline Hcl] Other (See Comments)    sedation     (Not in a hospital admission)  No results found for this or any previous visit (from the past 48 hour(s)). No results found.  ROS no recent illnesses or hospitalizations  Blood pressure 153/73, pulse 80, temperature 97.5 F (36.4 C), temperature source Oral, resp. rate 18, height 5\' 7"  (1.702 m), weight 59.421 kg (131 lb), last menstrual period 11/14/1992, SpO2 100.00%. Physical Exam  General Appearance:  Alert, cooperative, no distress, appears stated age  Head:  Normocephalic, without obvious  abnormality, atraumatic  Eyes:  Pupils equal, conjunctiva/corneas clear,         Throat: Lips, mucosa, and tongue normal; teeth and gums normal  Neck: No visible masses     Lungs:   respirations unlabored  Chest Wall:  No tenderness or deformity  Heart:  Regular rate and rhythm,  Abdomen:   Soft, non-tender,         Extremities: Right hand: small puncture wound over dorsum of hand. Mild redness over distal forearm and more redness over dorsum of hand. Good digital mobility but not full No streaking lymphyangitis. No epitrochlear nodes. Good radial pulse.  Pulses: 2+ and symmetric  Skin: Skin color, texture, turgor normal, no rashes or lesions     Neurologic: Normal   Assessment/Plan Right hand cellulitis and cat bite  Have discussed at length with patient about options. After going over them with her we decided to admit patient for iv antibiotics and observation. Immobilize hand.  Will check hand in 24 hours and if it worsens may require incision and drainage. Pt voiced understanding of plan and reason for admission   Linna Hoff 02/27/2014, 4:51 PM

## 2014-02-27 NOTE — ED Notes (Signed)
Spoke with Dr Caralyn Guile; to see pt to hold in POD C until comes; no orders at present

## 2014-02-27 NOTE — Progress Notes (Signed)
Orthopedic Tech Progress Note Patient Details:  Julie Mann [facility] Mar 14, 1946 [id number]  Ortho Devices Type of Ortho Device: Ace wrap;Volar splint Ortho Device/Splint Interventions: Ordered   Braulio Bosch 02/27/2014, 5:28 PM

## 2014-02-27 NOTE — Progress Notes (Signed)
Orthopedic Tech Progress Note Patient Details:  Julie Mann 1946-05-14 [id number] Verbal order from Dr. Caralyn Guile Patient ID: [name], female   DOB: 11-19-1945, 68 y.o.   MRN: [id number]   Braulio Bosch 02/27/2014, 5:28 PM

## 2014-02-27 NOTE — ED Provider Notes (Signed)
Patient sent to the ER to be seen by Dr. Apolonio Schneiders for hand cellulitis after a cat bite. Medical screening exam performed by me shows right hand cellulitis tracking up her forearm. Her vital signs are stable, no fevers, chills, or toxic appearance. Dr. Apolonio Schneiders will see the patient in the ED and assume care.  Ephraim Hamburger, MD 02/27/14 [phone]

## 2014-02-28 MED ORDER — AMOXICILLIN-POT CLAVULANATE 875-125 MG PO TABS: 1.0000 | ORAL_TABLET | Freq: Two times a day (BID) | ORAL | Status: DC

## 2014-02-28 NOTE — Discharge Summary (Signed)
Physician Discharge Summary  Patient ID: Julie Mann MRN: [id number] DOB/AGE: 1946-11-09 68 y.o.  Admit date: 02/27/2014 Discharge date: 02/28/2014  Admission Diagnoses: * No surgery found * Past Medical History  Diagnosis Date  . Osteopenia   . High cholesterol   . History of stomach ulcers ~ 1966  . Arthritis     "hands, fingers, back" (02/27/2014)  . 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 Cat scratch of right hand 02/24/2014    Discharge Diagnoses:  Active Problems:   Cat bite of hand   Surgeries:  on * No surgery found *    Consultants:    Discharged Condition: Improved  Hospital Course: Julie Mann is an 68 y.o. female who was admitted 02/27/2014 with a chief complaint of  Chief Complaint  Patient presents with  . Cellulitis  , and found to have a diagnosis of * No surgery found *.  They were brought to the operating room on * No surgery found * and underwent .    They were given perioperative antibiotics: Anti-infectives   Start     Dose/Rate Route Frequency Ordered Stop   02/28/14 0000  amoxicillin-clavulanate (AUGMENTIN) 875-125 MG per tablet     1 tablet Oral 2 times daily 02/28/14 1732     02/27/14 1700  Ampicillin-Sulbactam (UNASYN) 3 g in sodium chloride 0.9 % 100 mL IVPB     3 g 100 mL/hr over 60 Minutes Intravenous Every 6 hours 02/27/14 1658      .  They were given sequential compression devices, early ambulation, and Other (comment)ambulation for DVT prophylaxis.  Recent vital signs: Patient Vitals for the past 24 hrs:  BP Temp Temp src Pulse Resp SpO2  02/28/14 1410 124/62 mmHg 98 F (36.7 C) - 100 18 99 %  02/28/14 0539 133/65 mmHg 97.7 F (36.5 C) Oral 72 18 98 %  02/27/14 1955 137/61 mmHg 97.7 F (36.5 C) Oral 82 18 100 %  02/27/14 1755 157/75 mmHg 98.5 F (36.9 C) Oral 81 18 98 %  .  Recent laboratory studies: No results found.  Discharge Medications:     Medication List    STOP taking these medications      ciprofloxacin 500 MG tablet  Commonly known as:  CIPRO      TAKE these medications       amoxicillin-clavulanate 875-125 MG per tablet  Commonly known as:  AUGMENTIN  Take 1 tablet by mouth 2 (two) times daily.     Cod Liver Oil 10 MINIM Caps  Take 1 capsule by mouth daily.     ibuprofen 600 MG tablet  Commonly known as:  ADVIL,MOTRIN  Take 1 tablet (600 mg total) by mouth as needed for moderate pain.     LORazepam 1 MG tablet  Commonly known as:  ATIVAN  Take 0.5-1 mg by mouth every 8 (eight) hours as needed for anxiety.      ASK your doctor about these medications       multivitamin with minerals Tabs tablet  Take 1 tablet by mouth daily.        Diagnostic Studies: Mm Digital Screening Bilateral  01/31/2014   CLINICAL DATA:  Screening.  EXAM: DIGITAL SCREENING BILATERAL MAMMOGRAM WITH CAD  COMPARISON:  Previous exam(s).  ACR Breast Density Category b: There are scattered areas of fibroglandular density.  FINDINGS: There are no findings suspicious for malignancy. Images were processed with CAD.  IMPRESSION: No mammographic evidence of  malignancy. A result letter of this screening mammogram will be mailed directly to the patient.  RECOMMENDATION: Screening mammogram in one year. (Code:SM-B-01Y)  BI-RADS CATEGORY  1: Negative.   Electronically Signed   By: Skipper Cliche M.D.   On: 01/31/2014 10:29    They benefited maximally from their hospital stay and there were no complications.     Disposition:       Future Appointments Provider Department Dept Phone   06/06/2014 9:15 AM Lyman Speller, MD [facility] [phone]     Follow-up Information   Follow up with Linna Hoff, MD. Schedule an appointment as soon as possible for a visit in 5 days.   Specialty:  Orthopedic Surgery   Contact information:   [address] [place] [address] [phone]      Pt looks good, hand much better ok to go home Wound redressed  today   Signed: Linna Hoff 02/28/2014, 5:33 PM

## 2014-02-28 NOTE — Progress Notes (Signed)
Patient discharged to home accompanied by husband. Discharge instructions given explained and patient stated understanding. IV was removed and patient left unit in a stable condition with all personal belongings.

## 2014-02-28 NOTE — Discharge Instructions (Signed)
[facility]EEP [facility] CALL OFFICE FOR F/U APPT [phone] on Tuesday 4/21 Dr Caralyn Guile [phone] KEEP HAND ELEVATED ABOVE HEART OK TO APPLY ICE TO OPERATIVE AREA CONTACT OFFICE IF ANY WORSENING PAIN OR CONCERNS.

## 2014-03-19 ENCOUNTER — Other Ambulatory Visit: Payer: Self-pay | Admitting: Obstetrics & Gynecology

## 2014-03-19 NOTE — Telephone Encounter (Signed)
Last AEX 02/19/13 Last refill 09/23/13 and 03/29/14 Next appt 06/11/2014   Please approve or deny Rx.

## 2014-03-21 NOTE — Telephone Encounter (Signed)
RX faxed

## 2014-03-26 ENCOUNTER — Encounter: Payer: Self-pay | Admitting: Obstetrics & Gynecology

## 2014-06-06 ENCOUNTER — Ambulatory Visit: Payer: Medicare Other | Admitting: Obstetrics & Gynecology

## 2014-06-26 ENCOUNTER — Encounter: Payer: Self-pay | Admitting: Obstetrics & Gynecology

## 2014-06-26 ENCOUNTER — Ambulatory Visit (INDEPENDENT_AMBULATORY_CARE_PROVIDER_SITE_OTHER): Payer: Medicare Other | Admitting: Obstetrics & Gynecology

## 2014-06-26 VITALS — BP 122/66 | HR 72 | Resp 18 | Ht 66.5 in | Wt 126.0 lb

## 2014-06-26 DIAGNOSIS — Z124 Encounter for screening for malignant neoplasm of cervix: Secondary | ICD-10-CM

## 2014-06-26 DIAGNOSIS — C20 Malignant neoplasm of rectum: Secondary | ICD-10-CM

## 2014-06-26 DIAGNOSIS — Z0000 Encounter for general adult medical examination without abnormal findings: Secondary | ICD-10-CM

## 2014-06-26 DIAGNOSIS — Z01419 Encounter for gynecological examination (general) (routine) without abnormal findings: Secondary | ICD-10-CM

## 2014-06-26 LAB — POCT URINALYSIS DIPSTICK
Bilirubin, UA: NEGATIVE
Blood, UA: NEGATIVE
Glucose, UA: NEGATIVE
Ketones, UA: NEGATIVE
Nitrite, UA: NEGATIVE
Protein, UA: NEGATIVE
Urobilinogen, UA: NEGATIVE
pH, UA: 5

## 2014-06-26 MED ORDER — LORAZEPAM 1 MG PO TABS: ORAL_TABLET | ORAL | Status: DC

## 2014-06-26 MED ORDER — ESTROGENS, CONJUGATED 0.625 MG/GM VA CREA: 1.0000 | TOPICAL_CREAM | Freq: Every day | VAGINAL | Status: DC

## 2014-06-26 MED ORDER — IBUPROFEN 600 MG PO TABS: 600.0000 mg | ORAL_TABLET | ORAL | Status: DC | PRN

## 2014-06-26 NOTE — Progress Notes (Signed)
68 y.o. [id number] MarriedCaucasianF here for annual exam.  Having a lot of issues with her GI infections.  Had a skin neck infections.  Was on doxycycline but this caused her a lot of GI issues.  Later had cat scratch fever and was on Augmentin IV.  Since, she has been having GI issues, especially diarrhea.  Dr. Cristina Gong put her on Align.  Been on it about 8 days.  This has helped.   Sees Dr. Felipa Eth for yearly labs.  Has seen him more this year due to other issues.   Patient's last menstrual period was 11/14/1992.          Sexually active: Yes.    The current method of family planning is post menopausal status.    Exercising: Yes.    Walking, fit for [age], horseback riding  Smoker:  no  Health Maintenance: Pap: 02/2013 Neg History of abnormal Pap:  no MMG: 01/2014 BIRADS1 Colonoscopy:  01/2013 - Polyps BMD:   12/2012, -1.0/-1.4 TDaP: up to date - Per pt  Screening Labs: PCP, Hb today: PCP, Urine today: [id number]   reports that she has quit smoking. Her smoking use included Cigarettes. She has a 30 pack-year smoking history. She has never used smokeless tobacco. She reports that she does not drink alcohol or use illicit drugs.  Past Medical History  Diagnosis Date  . Osteopenia   . High cholesterol   . History of stomach ulcers ~ 1966  . Arthritis     "hands, fingers, back" (02/27/2014)  . 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 Cat scratch of right hand 02/24/2014    Past Surgical History  Procedure Laterality Date  . Varicose vein surgery Left     left leg  . Dilation and curettage of uterus  1980's    S/P miscarriage  . Breast cyst excision  1966  . Anus surgery  2005    "biopsy"    Current Outpatient Prescriptions  Medication Sig Dispense Refill  . bifidobacterium infantis (ALIGN) capsule Take 1 capsule by mouth daily.      Marland Kitchen Cod Liver Oil 10 MINIM CAPS Take 1 capsule by mouth daily.      Marland Kitchen ibuprofen (ADVIL,MOTRIN) 600 MG tablet Take 1 tablet (600 mg  total) by mouth as needed for moderate pain.  60 tablet  3  . LORazepam (ATIVAN) 1 MG tablet TAKE ONE-HALF (1/2) TO ONE (1) TABLET BYMOUTH A DAY AS NEEDED FOR ANXIETY  30 tablet  0  . Multiple Vitamin (MULTIVITAMIN WITH MINERALS) TABS tablet Take 1 tablet by mouth daily.       No current facility-administered medications for this visit.    Family History  Problem Relation Age of Onset  . Breast cancer Maternal Aunt   . Breast cancer Maternal Grandmother     ROS:  Pertinent items are noted in HPI.  Otherwise, a comprehensive ROS was negative.  Exam:   BP 122/66  Pulse 72  Resp 18  Ht 5' 6.5" (1.689 m)  Wt 126 lb (57.153 kg)  BMI 20.03 kg/m2  LMP 11/14/1992  Weight change: -10#  Height: 5' 6.5" (168.9 cm)  Ht Readings from Last 3 Encounters:  06/26/14 5' 6.5" (1.689 m)  02/27/14 5\' 7"  (1.702 m)  02/19/13 5' 7.75" (1.721 m)    General appearance: alert, cooperative and appears stated age Head: Normocephalic, without obvious abnormality, atraumatic Neck: no adenopathy, supple, symmetrical, trachea midline and thyroid normal to inspection and palpation  Lungs: clear to auscultation bilaterally Breasts: normal appearance, no masses or tenderness Heart: regular rate and rhythm Abdomen: soft, non-tender; bowel sounds normal; no masses,  no organomegaly Extremities: extremities normal, atraumatic, no cyanosis or edema Skin: Skin color, texture, turgor normal. No rashes or lesions Lymph nodes: Cervical, supraclavicular, and axillary nodes normal. No abnormal inguinal nodes palpated Neurologic: Grossly normal   Pelvic: External genitalia:  no lesions              Urethra:  normal appearing urethra with no masses, tenderness or lesions              Bartholins and Skenes: normal                 Vagina: normal appearing vagina with normal color and discharge, no lesions              Cervix: no lesions              Pap taken: Yes.   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  Followed by Dr. Cristina Gong  P: Mammogram yearly  pap smear yearly  Ibuprofen and Lorazepam rxs given  Rx for Premarin cream to be used externally.  Recommended Boudreax's Butt paste for skin changes due to radiation and recent diarrhea. return annually or prn  An After Visit Summary was printed and given to the patient.

## 2014-06-26 NOTE — Patient Instructions (Signed)
Boudreaux's butt paste

## 2014-06-26 NOTE — Addendum Note (Signed)
Addended by: Megan Salon on: 06/26/2014 10:46 AM   Modules accepted: Orders

## 2014-06-30 LAB — IPS PAP SMEAR ONLY

## 2014-09-15 ENCOUNTER — Encounter: Payer: Self-pay | Admitting: Obstetrics & Gynecology

## 2015-02-09 ENCOUNTER — Other Ambulatory Visit: Payer: Self-pay

## 2015-02-09 DIAGNOSIS — Z1231 Encounter for screening mammogram for malignant neoplasm of breast: Secondary | ICD-10-CM

## 2015-02-16 ENCOUNTER — Ambulatory Visit
Admission: RE | Admit: 2015-02-16 | Discharge: 2015-02-16 | Disposition: A | Discharge status: Home / Self Care | Payer: Medicare Other | Source: Ambulatory Visit

## 2015-02-16 DIAGNOSIS — Z1231 Encounter for screening mammogram for malignant neoplasm of breast: Secondary | ICD-10-CM

## 2015-04-16 ENCOUNTER — Other Ambulatory Visit: Payer: Self-pay | Admitting: Obstetrics & Gynecology

## 2015-04-16 NOTE — Telephone Encounter (Signed)
RX faxed to [facility].

## 2015-04-16 NOTE — Telephone Encounter (Signed)
Medication refill request: Ativan 1 mg  Last AEX:  02/19/13 with SM  Next AEX: 07/30/15 with SM Last MMG (if hormonal medication request): n/a Refill authorized: #30/2 rfs, please advise.

## 2015-06-22 ENCOUNTER — Other Ambulatory Visit: Payer: Self-pay | Admitting: Gastroenterology

## 2015-07-30 ENCOUNTER — Encounter: Payer: Self-pay | Admitting: Obstetrics & Gynecology

## 2015-07-30 ENCOUNTER — Ambulatory Visit (INDEPENDENT_AMBULATORY_CARE_PROVIDER_SITE_OTHER): Payer: Medicare Other | Admitting: Obstetrics & Gynecology

## 2015-07-30 VITALS — BP 126/68 | HR 72 | Resp 16 | Ht 67.0 in | Wt 132.0 lb

## 2015-07-30 DIAGNOSIS — Z01419 Encounter for gynecological examination (general) (routine) without abnormal findings: Secondary | ICD-10-CM

## 2015-07-30 DIAGNOSIS — Z124 Encounter for screening for malignant neoplasm of cervix: Secondary | ICD-10-CM | POA: Diagnosis not present

## 2015-07-30 DIAGNOSIS — C20 Malignant neoplasm of rectum: Secondary | ICD-10-CM | POA: Diagnosis not present

## 2015-07-30 NOTE — Progress Notes (Signed)
69 y.o. [id number] MarriedCaucasianF here for annual exam.  Doing well.  Pt reports new diagnosis of cataracts.  Reports has been seeing an optometrist  for years.  Had glasses redone.  Saw ophthalmologist and has cataracts.  Planning removal of one cataract in early October.  She will have the second one done possibly in October or January.  This fall is really busy for her.   Denies vaginal bleeding.     Doing back exercises and she is doing specific ones daily.  Using her motrin much less frequently.  She used just about one RF of this last year.  PCP:  Dr. Felipa Eth  Patient's last menstrual period was 11/14/1992.          Sexually active: Yes.    The current method of family planning is post menopausal status.    Exercising: Yes.    walking, horseback riding, gardening, and fit for [age] Smoker:  Former smoker  Health Maintenance: Pap:  06/26/14 WNL  H/O rectal cancer History of abnormal Pap:  no MMG:  02/16/15 BiRads 1-negative Colonoscopy:  8/16-repeat in 5 years, h/o rectal cancer BMD:   2/14 TDaP:  UTD with PCP Screening Labs: PCP, Hb today: PCP, Urine today: PCP   reports that she has quit smoking. Her smoking use included Cigarettes. She has a 30 pack-year smoking history. She has never used smokeless tobacco. She reports that she does not drink alcohol or use illicit drugs.  Past Medical History  Diagnosis Date  . Osteopenia   . High cholesterol   . History of stomach ulcers ~ 1966  . Arthritis     "hands, fingers, back" (02/27/2014)  . 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 Cat scratch of right hand 02/24/2014    Past Surgical History  Procedure Laterality Date  . Varicose vein surgery Left     left leg  . Dilation and curettage of uterus  1980's    S/P miscarriage  . Breast cyst excision  1966  . Anus surgery  2005    "biopsy"    Current Outpatient Prescriptions  Medication Sig Dispense Refill  . Cod Liver Oil 10 MINIM CAPS Take 1 capsule by  mouth daily.    Marland Kitchen conjugated estrogens (PREMARIN) vaginal cream Place 1 Applicatorful vaginally daily. 30 g 1  . ibuprofen (ADVIL,MOTRIN) 600 MG tablet Take 1 tablet (600 mg total) by mouth as needed for moderate pain. 60 tablet 3  . LORazepam (ATIVAN) 1 MG tablet TAKE ONE-HALF (1/2) TO ONE (1) TABLET BYMOUTH ONCE A DAY AS NEEDED FOR ANXIETY 30 tablet 2   No current facility-administered medications for this visit.    Family History  Problem Relation Age of Onset  . Breast cancer Maternal Aunt   . Breast cancer Maternal Grandmother     ROS:  Pertinent items are noted in HPI.  Otherwise, a comprehensive ROS was negative.  Exam:   BP 126/68 mmHg  Pulse 72  Resp 16  Ht 5\' 7"  (1.702 m)  Wt 132 lb (59.875 kg)  BMI 20.67 kg/m2  LMP 11/14/1992  Weight change: +6#   Height: 5\' 7"  (170.2 cm) (hair in a bun)  Ht Readings from Last 3 Encounters:  07/30/15 5\' 7"  (1.702 m)  06/26/14 5' 6.5" (1.689 m)  02/27/14 5\' 7"  (5.035 m)    General appearance: alert, cooperative and appears stated age Head: Normocephalic, without obvious abnormality, atraumatic Neck: no adenopathy, supple, symmetrical, trachea midline and thyroid normal  to inspection and palpation Lungs: clear to auscultation bilaterally Breasts: normal appearance, no masses or tenderness Heart: regular rate and rhythm Abdomen: soft, non-tender; bowel sounds normal; no masses,  no organomegaly Extremities: extremities normal, atraumatic, no cyanosis or edema Skin: Skin color, texture, turgor normal. No rashes or lesions Lymph nodes: Cervical, supraclavicular, and axillary nodes normal. No abnormal inguinal nodes palpated Neurologic: Grossly normal   Pelvic: External genitalia:  no lesions              Urethra:  normal appearing urethra with no masses, tenderness or lesions              Bartholins and Skenes: normal                 Vagina: normal appearing vagina with normal color and discharge, no lesions              Cervix:  no lesions              Pap taken: Yes.   Bimanual Exam:  Uterus:  normal size, contour, position, consistency, mobility, non-tender              Adnexa: normal adnexa and no mass, fullness, tenderness               Rectovaginal: Confirms               Anus:  normal sphincter tone, no lesions, radiation changes  Chaperone was present for exam.  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  Uses Lorazepam infrequently. Chronic diarrhea. Followed by Dr. Cristina Gong  P: Mammogram yearly  pap smear yearly  Ibuprofen and Lorazepam not needed right now.  She will call when this is needed as well.   Doesn't need rx for Premarin cream at this time.  She will call if she does need it.   return annually or prn

## 2015-07-31 LAB — IPS PAP SMEAR ONLY

## 2015-11-02 ENCOUNTER — Other Ambulatory Visit: Payer: Self-pay | Admitting: Obstetrics & Gynecology

## 2015-11-02 NOTE — Telephone Encounter (Signed)
Medication refill request: Ibuprofen 800mg  Last AEX:  07-30-15 Next AEX: 11-25-16  Last MMG (if hormonal medication request): 02-16-15 WNL  Refill authorized: please advise

## 2016-01-18 ENCOUNTER — Other Ambulatory Visit: Payer: Self-pay | Admitting: Obstetrics & Gynecology

## 2016-01-18 NOTE — Telephone Encounter (Signed)
Medication refill request: ativan  Last AEX:  07/30/15 SM Next AEX: 11/25/16 SM Last MMG (if hormonal medication request): 02/16/15 BIRADS1:Neg Refill authorized: 04/16/15 #30tabs/2R. Today please advise.   Routed to Dr. Quincy Simmonds

## 2016-01-19 NOTE — Telephone Encounter (Signed)
Rx faxed today to [facility] pharmacy

## 2016-02-02 ENCOUNTER — Other Ambulatory Visit: Payer: Self-pay

## 2016-02-02 DIAGNOSIS — Z1231 Encounter for screening mammogram for malignant neoplasm of breast: Secondary | ICD-10-CM

## 2016-02-18 ENCOUNTER — Ambulatory Visit
Admission: RE | Admit: 2016-02-18 | Discharge: 2016-02-18 | Disposition: A | Discharge status: Home / Self Care | Payer: Medicare Other | Source: Ambulatory Visit

## 2016-02-18 DIAGNOSIS — Z1231 Encounter for screening mammogram for malignant neoplasm of breast: Secondary | ICD-10-CM

## 2016-09-16 ENCOUNTER — Telehealth: Payer: Self-pay | Admitting: Obstetrics & Gynecology

## 2016-09-16 MED ORDER — LORAZEPAM 1 MG PO TABS: ORAL_TABLET | ORAL | 0 refills | Status: DC

## 2016-09-16 NOTE — Telephone Encounter (Signed)
Prescription faxed to [phone]. Patient notified prescription sent and verbalizes understanding.

## 2016-09-16 NOTE — Telephone Encounter (Signed)
Patient called and request a refill on her Lorazapam.

## 2016-09-16 NOTE — Telephone Encounter (Signed)
Med refill request: Lorazepam 1mg  Last AEX: 07/30/15 Next AEX: 11/25/16 Last MMG (if hormonal med) Refill authorized: Please Advise? Last filled 01/18/16 #30/0RF

## 2016-09-16 NOTE — Telephone Encounter (Signed)
RF done for #30/0RF.  Ok to fax to pharmacy on file and close encounter.

## 2016-10-24 ENCOUNTER — Emergency Department (HOSPITAL_COMMUNITY)
Admission: EM | Admit: 2016-10-24 | Discharge: 2016-10-24 | Disposition: A | Discharge status: Home / Self Care | Payer: Medicare Other | Attending: Emergency Medicine | Admitting: Emergency Medicine

## 2016-10-24 ENCOUNTER — Encounter (HOSPITAL_COMMUNITY): Payer: Self-pay

## 2016-10-24 DIAGNOSIS — Z87891 Personal history of nicotine dependence: Secondary | ICD-10-CM | POA: Insufficient documentation

## 2016-10-24 DIAGNOSIS — R0602 Shortness of breath: Secondary | ICD-10-CM | POA: Diagnosis present

## 2016-10-24 DIAGNOSIS — I480 Paroxysmal atrial fibrillation: Secondary | ICD-10-CM | POA: Diagnosis not present

## 2016-10-24 DIAGNOSIS — E039 Hypothyroidism, unspecified: Secondary | ICD-10-CM

## 2016-10-24 DIAGNOSIS — Z85048 Personal history of other malignant neoplasm of rectum, rectosigmoid junction, and anus: Secondary | ICD-10-CM | POA: Insufficient documentation

## 2016-10-24 LAB — CBC WITH DIFFERENTIAL/PLATELET
Basophils Absolute: 0 10*3/uL (ref 0.0–0.1)
Basophils Relative: 0 %
Eosinophils Absolute: 0.1 10*3/uL (ref 0.0–0.7)
Eosinophils Relative: 1 %
HCT: 42.4 % (ref 36.0–46.0)
Hemoglobin: 14 g/dL (ref 12.0–15.0)
Lymphocytes Relative: 14 %
Lymphs Abs: 1.2 10*3/uL (ref 0.7–4.0)
MCH: 26.8 pg (ref 26.0–34.0)
MCHC: 33 g/dL (ref 30.0–36.0)
MCV: 81.1 fL (ref 78.0–100.0)
Monocytes Absolute: 0.7 10*3/uL (ref 0.1–1.0)
Monocytes Relative: 8 %
Neutro Abs: 6.9 10*3/uL (ref 1.7–7.7)
Neutrophils Relative %: 77 %
Platelets: 322 10*3/uL (ref 150–400)
RBC: 5.23 MIL/uL — ABNORMAL HIGH (ref 3.87–5.11)
RDW: 14.1 % (ref 11.5–15.5)
WBC: 8.9 10*3/uL (ref 4.0–10.5)

## 2016-10-24 LAB — BASIC METABOLIC PANEL
Anion gap: 10 (ref 5–15)
BUN: 17 mg/dL (ref 6–20)
CO2: 20 mmol/L — ABNORMAL LOW (ref 22–32)
Calcium: 9.1 mg/dL (ref 8.9–10.3)
Chloride: 107 mmol/L (ref 101–111)
Creatinine, Ser: 0.87 mg/dL (ref 0.44–1.00)
GFR calc Af Amer: >60 mL/min (ref >60)
GFR calc non Af Amer: >60 mL/min (ref >60)
Glucose, Bld: 122 mg/dL — ABNORMAL HIGH (ref 65–99)
Potassium: 3.9 mmol/L (ref 3.5–5.1)
Sodium: 137 mmol/L (ref 135–145)

## 2016-10-24 LAB — TSH: TSH: 7.475 u[IU]/mL — ABNORMAL HIGH (ref 0.350–4.500)

## 2016-10-24 LAB — T4, FREE: Free T4: 0.83 ng/dL (ref 0.61–1.12)

## 2016-10-24 LAB — TROPONIN I: Troponin I: <0.03 ng/mL (ref <0.03)

## 2016-10-24 MED ORDER — LEVOTHYROXINE SODIUM 50 MCG PO TABS: 50.0000 ug | ORAL_TABLET | Freq: Every day | ORAL | 0 refills | Status: DC

## 2016-10-24 MED ORDER — APIXABAN 5 MG PO TABS: 5.0000 mg | ORAL_TABLET | Freq: Two times a day (BID) | ORAL | 0 refills | Status: DC

## 2016-10-24 MED ORDER — FLECAINIDE ACETATE 100 MG PO TABS
200.0000 mg | ORAL_TABLET | Freq: Once | ORAL | Status: AC
  Administered 2016-10-24: 200 mg via ORAL
  Filled 2016-10-24: qty 2

## 2016-10-24 MED ORDER — APIXABAN 5 MG PO TABS
5.0000 mg | ORAL_TABLET | Freq: Once | ORAL | Status: AC
  Administered 2016-10-24: 5 mg via ORAL
  Filled 2016-10-24: qty 1

## 2016-10-24 MED ORDER — DILTIAZEM LOAD VIA INFUSION
15.0000 mg | Freq: Once | INTRAVENOUS | Status: AC
  Administered 2016-10-24: 15 mg via INTRAVENOUS
  Filled 2016-10-24: qty 15

## 2016-10-24 MED ORDER — DILTIAZEM HCL 100 MG IV SOLR
5.0000 mg/h | INTRAVENOUS | Status: DC
  Administered 2016-10-24: 5 mg/h via INTRAVENOUS
  Filled 2016-10-24: qty 100

## 2016-10-24 NOTE — ED Notes (Signed)
Pt understood dc material. NAD noted. Scripts given at dc 

## 2016-10-24 NOTE — ED Triage Notes (Signed)
Pt states took Flonase ~ 7pm this evening. Pt states new onset SOB and tongue swelling. Pt with no obvious rash or hives at triage. Pt states hx of allergy to "many meds." Pt with clear lung fields throughout.

## 2016-10-24 NOTE — ED Provider Notes (Signed)
[facility] DEPT Provider Note   CSN: [id number] Arrival date & time: 10/24/16  0018     History   Chief Complaint Chief Complaint  Patient presents with  . Shortness of Breath  . Allergic Reaction    HPI Julie Mann is a 70 y.o. female.  She used Flonase this evening and thinks she developed an allergic reaction. She felt generally bad and felt her heart racing. She felt like she was having some difficulty breathing and thought her tongue might be swelling. There is no nausea or vomiting. There is no actual chest pressure or heaviness or tightness. She denies fever or chills. She took a dose of children's Benadryl without any benefit. She is allergic to multiple medications and thinks that she reacted to the Flonase.   The history is provided by the patient.    Past Medical History:  Diagnosis Date  . 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 Arthritis    "hands, fingers, back" (02/27/2014)  . Cat scratch of right hand 02/24/2014  . High cholesterol   . History of stomach ulcers ~ 1966  . Osteopenia     Patient Active Problem List   Diagnosis Date Noted  . Rectal cancer ([place]) 07/30/2015    Past Surgical History:  Procedure Laterality Date  . ANUS SURGERY  2005   "biopsy"  . BREAST CYST EXCISION  1966  . DILATION AND CURETTAGE OF UTERUS  1980's   S/P miscarriage  . VARICOSE VEIN SURGERY Left    left leg    OB History    Gravida Para Term Preterm AB Living   5 1     4 1    SAB TAB Ectopic Multiple Live Births                   Home Medications    Prior to Admission medications   Medication Sig Start Date End Date Taking? Authorizing Provider  Cod Liver Oil 10 MINIM CAPS Take 1 capsule by mouth daily.    Historical Provider, MD  conjugated estrogens (PREMARIN) vaginal cream Place 1 Applicatorful vaginally daily. 06/26/14   Megan Salon, MD  ibuprofen (ADVIL,MOTRIN) 600 MG tablet TAKE ONE TABLET BY MOUTH AS NEEDED FOR MODERATE  PAIN 11/02/15   Megan Salon, MD  LORazepam (ATIVAN) 1 MG tablet TAKE ONE-HALF (1/2) TO ONE (1) TABLET BYMOUTH ONCE A DAY AS NEEDED FOR ANXIETY 09/16/16   Megan Salon, MD    Family History Family History  Problem Relation Age of Onset  . Breast cancer Maternal Aunt   . Breast cancer Maternal Grandmother     Social History Social History  Substance Use Topics  . Smoking status: Former Smoker    Packs/day: 1.00    Years: 30.00    Types: Cigarettes  . Smokeless tobacco: Never Used  . Alcohol use No     Allergies   Doxycycline; Erythromycin; Ivp dye [iodinated diagnostic agents]; Keflex [cephalexin]; Premarin [conjugated estrogens]; Provera [medroxyprogesterone acetate]; Zithromax [azithromycin]; and Zoloft [sertraline hcl]   Review of Systems Review of Systems  All other systems reviewed and are negative.    Physical Exam Updated Vital Signs BP 116/72   Pulse 72   Resp 19   LMP 11/14/1992   SpO2 97%   Physical Exam  Nursing note and vitals reviewed.  70 year old female, resting comfortably and in no acute distress. Vital signs are significant for tachycardia. Oxygen saturation is 97%, which  is normal. Head is normocephalic and atraumatic. PERRLA, EOMI. Oropharynx is clear.No swelling of tongue or sublingual tissues. No swelling of lips. No swelling of uvula. No difficulty with secretions. Phonation is normal. Neck is nontender and supple without adenopathy or JVD. Back is nontender and there is no CVA tenderness. Lungs are clear without rales, wheezes, or rhonchi. Chest is nontender. Heart Is tachycardic and irregular without murmur. Abdomen is soft, flat, nontender without masses or hepatosplenomegaly and peristalsis is normoactive. Extremities have no cyanosis or edema, full range of motion is present. Skin is warm and dry without rash. Neurologic: Mental status is normal, cranial nerves are intact, there are no motor or sensory deficits.  ED Treatments /  Results  Labs (all labs ordered are listed, but only abnormal results are displayed) Labs Reviewed  BASIC METABOLIC PANEL - Abnormal; Notable for the following:       Result Value   CO2 20 (*)    Glucose, Bld 122 (*)    All other components within normal limits  CBC WITH DIFFERENTIAL/PLATELET - Abnormal; Notable for the following:    RBC 5.23 (*)    All other components within normal limits  TSH - Abnormal; Notable for the following:    TSH 7.475 (*)    All other components within normal limits  TROPONIN I  T4, FREE    EKG  EKG Interpretation  Date/Time:  Monday October 24 2016 00:22:49 EST Ventricular Rate:  135 PR Interval:  116 QRS Duration: 72 QT Interval:  300 QTC Calculation: 450 R Axis:   91 Text Interpretation:  ** Suspect arm lead reversal, interpretation assumes no reversal Atrial flutter Rightward axis Cannot rule out Anterior infarct , age undetermined ST & T wave abnormality, consider inferolateral ischemia Abnormal ECG When compared with ECG of 02/07/2004, Atrial flutter has replaced Sinus rhythm ST-t abnormality is now Present , porobably rate-related Confirmed by [facility]  MD, Raniah Karan ([id number]) on 10/24/2016 1:18:07 AM       Procedures Procedures (including critical care time) CRITICAL CARE Performed by: [id number] Total critical care time: 50 minutes Critical care time was exclusive of separately billable procedures and treating other patients. Critical care was necessary to treat or prevent imminent or life-threatening deterioration. Critical care was time spent personally by me on the following activities: development of treatment plan with patient and/or surrogate as well as nursing, discussions with consultants, evaluation of patient's response to treatment, examination of patient, obtaining history from patient or surrogate, ordering and performing treatments and interventions, ordering and review of laboratory studies, ordering and review of radiographic  studies, pulse oximetry and re-evaluation of patient's condition.  Medications Ordered in ED Medications  diltiazem (CARDIZEM) 1 mg/mL load via infusion 15 mg (15 mg Intravenous Bolus from Bag 10/24/16 0154)    And  diltiazem (CARDIZEM) 100 mg in dextrose 5 % 100 mL (1 mg/mL) infusion (0 mg/hr Intravenous Stopped 10/24/16 0258)  flecainide (TAMBOCOR) tablet 200 mg (200 mg Oral Given 10/24/16 0216)  apixaban (ELIQUIS) tablet 5 mg (5 mg Oral Given 10/24/16 0216)     Initial Impression / Assessment and Plan / ED Course  I have reviewed the triage vital signs and the nursing notes.  Pertinent lab results that were available during my care of the patient were reviewed by me and considered in my medical decision making (see chart for details).  Clinical Course    New-onset atrial fib/atrial flutter. ECG seems most consistent with atrial flutter, but monitor shows atrial fibrillation. CHADS-VASC Score =  2, correlating to a stroke risk of 2.2% per year, and 2.9% risk of stroke or TIA or systemic embolization per year. She is started on apixaban and is given diltiazem for rate control and is given a dose of flecainide to attempt chemical cardioversion. Old records are reviewed, and she has no relevant past visits.  She had successful conversion to sinus rhythm with above noted treatment. Laboratory workup does show significant hypothyroidism. She is discharged with prescription for apixaban and levothyroxin. She is referred to cardiology for outpatient evaluation, and is referred back to PCP for titration of her levothyroxine dose.  Final Clinical Impressions(s) / ED Diagnoses   Final diagnoses:  Paroxysmal atrial fibrillation (HCC)  Acquired hypothyroidism    New Prescriptions New Prescriptions   APIXABAN (ELIQUIS) 5 MG TABS TABLET    Take 1 tablet (5 mg total) by mouth 2 (two) times daily.   LEVOTHYROXINE (SYNTHROID, LEVOTHROID) 50 MCG TABLET    Take 1 tablet (50 mcg total) by mouth daily  before breakfast.     Delora Fuel, MD [id number] [phone]

## 2016-10-24 NOTE — Discharge Instructions (Signed)
Do not take nonsteroidal anti-inflammatory drugs (ibuprofen, naproxen) while taking apixaban.

## 2016-10-25 ENCOUNTER — Emergency Department (HOSPITAL_COMMUNITY): Payer: Medicare Other

## 2016-10-25 ENCOUNTER — Encounter (HOSPITAL_COMMUNITY): Payer: Self-pay | Admitting: *Deleted

## 2016-10-25 ENCOUNTER — Emergency Department (HOSPITAL_COMMUNITY)
Admission: EM | Admit: 2016-10-25 | Discharge: 2016-10-25 | Disposition: A | Discharge status: Home / Self Care | Payer: Medicare Other | Attending: Emergency Medicine | Admitting: Emergency Medicine

## 2016-10-25 DIAGNOSIS — R519 Headache, unspecified: Secondary | ICD-10-CM

## 2016-10-25 DIAGNOSIS — Z87891 Personal history of nicotine dependence: Secondary | ICD-10-CM | POA: Insufficient documentation

## 2016-10-25 DIAGNOSIS — Z7901 Long term (current) use of anticoagulants: Secondary | ICD-10-CM | POA: Insufficient documentation

## 2016-10-25 DIAGNOSIS — R0789 Other chest pain: Secondary | ICD-10-CM | POA: Insufficient documentation

## 2016-10-25 DIAGNOSIS — R51 Headache: Secondary | ICD-10-CM | POA: Insufficient documentation

## 2016-10-25 DIAGNOSIS — Z85048 Personal history of other malignant neoplasm of rectum, rectosigmoid junction, and anus: Secondary | ICD-10-CM | POA: Insufficient documentation

## 2016-10-25 HISTORY — DX: Unspecified atrial fibrillation: I48.91

## 2016-10-25 LAB — CBC
HCT: 42.7 % (ref 36.0–46.0)
Hemoglobin: 14 g/dL (ref 12.0–15.0)
MCH: 26.9 pg (ref 26.0–34.0)
MCHC: 32.8 g/dL (ref 30.0–36.0)
MCV: 82.1 fL (ref 78.0–100.0)
Platelets: 311 10*3/uL (ref 150–400)
RBC: 5.2 MIL/uL — ABNORMAL HIGH (ref 3.87–5.11)
RDW: 14.2 % (ref 11.5–15.5)
WBC: 6.4 10*3/uL (ref 4.0–10.5)

## 2016-10-25 LAB — BASIC METABOLIC PANEL
Anion gap: 10 (ref 5–15)
BUN: 10 mg/dL (ref 6–20)
CO2: 23 mmol/L (ref 22–32)
Calcium: 9.4 mg/dL (ref 8.9–10.3)
Chloride: 105 mmol/L (ref 101–111)
Creatinine, Ser: 0.93 mg/dL (ref 0.44–1.00)
GFR calc Af Amer: >60 mL/min (ref >60)
GFR calc non Af Amer: 60 mL/min — ABNORMAL LOW (ref >60)
Glucose, Bld: 139 mg/dL — ABNORMAL HIGH (ref 65–99)
Potassium: 3.9 mmol/L (ref 3.5–5.1)
Sodium: 138 mmol/L (ref 135–145)

## 2016-10-25 LAB — I-STAT TROPONIN, ED: Troponin i, poc: 0 ng/mL (ref 0.00–0.08)

## 2016-10-25 MED ORDER — AMOXICILLIN 500 MG PO CAPS: 500.0000 mg | ORAL_CAPSULE | Freq: Three times a day (TID) | ORAL | 0 refills | Status: DC

## 2016-10-25 MED ORDER — GUAIFENESIN 100 MG/5ML PO LIQD: 100.0000 mg | Freq: Three times a day (TID) | ORAL | 0 refills | Status: DC | PRN

## 2016-10-25 MED ORDER — LORAZEPAM 2 MG/ML IJ SOLN
1.0000 mg | Freq: Once | INTRAMUSCULAR | Status: AC
  Administered 2016-10-25: 1 mg via INTRAMUSCULAR
  Filled 2016-10-25: qty 1

## 2016-10-25 MED ORDER — AMOXICILLIN 500 MG PO CAPS
500.0000 mg | ORAL_CAPSULE | Freq: Once | ORAL | Status: AC
  Administered 2016-10-25: 500 mg via ORAL
  Filled 2016-10-25: qty 1

## 2016-10-25 NOTE — ED Notes (Signed)
Pt back from MRI appears in no distress

## 2016-10-25 NOTE — ED Notes (Signed)
Patient transported to MRI 

## 2016-10-25 NOTE — ED Notes (Signed)
Spoke with MRI pt is anxious in MRI holding regarding exam PA made aware orders received and carried out

## 2016-10-25 NOTE — ED Provider Notes (Signed)
[facility] DEPT Provider Note   CSN: [id number] Arrival date & time: 10/25/16  [id number]     History   Chief Complaint Chief Complaint  Patient presents with  . Atrial Fibrillation  . Weakness    HPI Julie Mann is a 70 y.o. female.   Headache   This is a new problem. The current episode started more than 1 week ago. The problem occurs constantly. The problem has not changed since onset.The headache is associated with nothing. The quality of the pain is described as dull. The pain is mild. The pain does not radiate. Pertinent negatives include no anorexia, no fever, no malaise/fatigue, no orthopnea and no palpitations. She has tried NSAIDs for the symptoms. The treatment provided mild relief.    Past Medical History:  Diagnosis Date  . 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 Arthritis    "hands, fingers, back" (02/27/2014)  . Atrial fibrillation ([place])   . Cat scratch of right hand 02/24/2014  . High cholesterol   . History of stomach ulcers ~ 1966  . Osteopenia     Patient Active Problem List   Diagnosis Date Noted  . Rectal cancer ([place]) 07/30/2015    Past Surgical History:  Procedure Laterality Date  . ANUS SURGERY  2005   "biopsy"  . BREAST CYST EXCISION  1966  . DILATION AND CURETTAGE OF UTERUS  1980's   S/P miscarriage  . VARICOSE VEIN SURGERY Left    left leg    OB History    Gravida Para Term Preterm AB Living   5 1     4 1    SAB TAB Ectopic Multiple Live Births                   Home Medications    Prior to Admission medications   Medication Sig Start Date End Date Taking? Authorizing Provider  amoxicillin (AMOXIL) 500 MG capsule Take 1 capsule (500 mg total) by mouth 3 (three) times daily. 10/25/16   Merrily Pew, MD  apixaban (ELIQUIS) 5 MG TABS tablet Take 1 tablet (5 mg total) by mouth 2 (two) times daily. [id number]   Delora Fuel, MD  [facility] Liver Oil 10 MINIM CAPS Take 1 capsule by mouth daily.    Historical  Provider, MD  conjugated estrogens (PREMARIN) vaginal cream Place 1 Applicatorful vaginally daily. 06/26/14   Megan Salon, MD  guaiFENesin (ROBITUSSIN) 100 MG/5ML liquid Take 5-10 mLs (100-200 mg total) by mouth 3 (three) times daily as needed for congestion. 10/25/16   Merrily Pew, MD  levothyroxine (SYNTHROID, LEVOTHROID) 50 MCG tablet Take 1 tablet (50 mcg total) by mouth daily before breakfast. [id number]   Delora Fuel, MD  LORazepam (ATIVAN) 1 MG tablet TAKE ONE-HALF (1/2) TO ONE (1) TABLET BYMOUTH ONCE A DAY AS NEEDED FOR ANXIETY 09/16/16   Megan Salon, MD    Family History Family History  Problem Relation Age of Onset  . Breast cancer Maternal Aunt   . Breast cancer Maternal Grandmother     Social History Social History  Substance Use Topics  . Smoking status: Former Smoker    Packs/day: 1.00    Years: 30.00    Types: Cigarettes  . Smokeless tobacco: Never Used  . Alcohol use No     Allergies   Doxycycline; Erythromycin; Ivp dye [iodinated diagnostic agents]; Keflex [cephalexin]; Premarin [conjugated estrogens]; Provera [medroxyprogesterone acetate]; Zithromax [azithromycin]; and Zoloft [sertraline hcl]   Review of Systems Review  of Systems  Constitutional: Negative for fever and malaise/fatigue.  HENT: Positive for postnasal drip, sinus pain and sinus pressure.   Cardiovascular: Negative for palpitations and orthopnea.  Gastrointestinal: Negative for anorexia.  Neurological: Positive for headaches.  All other systems reviewed and are negative.    Physical Exam Updated Vital Signs BP 154/75 (BP Location: Right Arm)   Pulse 69   Temp 97.5 F (36.4 C) (Oral)   Resp 18   LMP 11/14/1992   SpO2 100%   Physical Exam  Constitutional: She is oriented to person, place, and time. She appears well-developed and well-nourished.  HENT:  Head: Normocephalic and atraumatic.  Eyes: Conjunctivae and EOM are normal.  Neck: Normal range of motion.  Cardiovascular: Normal  rate and regular rhythm.   Pulmonary/Chest: Effort normal. No stridor. No respiratory distress. She has no wheezes. She has no rales.  Abdominal: Soft. She exhibits no distension.  Neurological: She is alert and oriented to person, place, and time.  No altered mental status, able to give full seemingly accurate history.  Face is assymmetric with slight facial droop (pt and husband think it is not abnormal), EOM's intact, pupils equal and reactive, vision intact, tongue and uvula midline without deviation Upper and Lower extremity motor 5/5, intact pain perception in distal extremities, 2+ reflexes in biceps, patella and achilles tendons. Finger to nose normal, heel to shin normal. Walks without assistance or evident ataxia.    Skin: Skin is warm and dry.  Nursing note and vitals reviewed.    ED Treatments / Results  Labs (all labs ordered are listed, but only abnormal results are displayed) Labs Reviewed  BASIC METABOLIC PANEL - Abnormal; Notable for the following:       Result Value   Glucose, Bld 139 (*)    GFR calc non Af Amer 60 (*)    All other components within normal limits  CBC - Abnormal; Notable for the following:    RBC 5.20 (*)    All other components within normal limits  I-STAT TROPOININ, ED    EKG  EKG Interpretation  Date/Time:  Tuesday October 25 2016 09:37:30 EST Ventricular Rate:  79 PR Interval:  166 QRS Duration: 84 QT Interval:  404 QTC Calculation: 463 R Axis:   95 Text Interpretation:  Normal sinus rhythm Rightward axis Borderline ECG No significant change since last tracing Confirmed by [facility] MD, Epifanio Labrador [phone]) on 10/25/2016 11:06:18 AM       Radiology Dg Chest 2 View  Result Date: 10/25/2016 CLINICAL DATA:  Dizziness and tachycardia EXAM: CHEST  2 VIEW COMPARISON:  January 19, 2012 FINDINGS: There is mild scarring in the right upper lobe. There is no edema or consolidation. The heart size and pulmonary vascularity are normal. No adenopathy. There  is atherosclerotic calcification in the aorta. No bone lesions. IMPRESSION: Mild scarring right upper lobe. No edema or consolidation. Aortic atherosclerosis. Electronically Signed   By: Lowella Grip III M.D.   On: 10/25/2016 10:31   Ct Head Wo Contrast  Result Date: 10/25/2016 CLINICAL DATA:  Headache, lightheadedness and dizziness for 3 days. No known injury. EXAM: CT HEAD WITHOUT CONTRAST TECHNIQUE: Contiguous axial images were obtained from the base of the skull through the vertex without intravenous contrast. COMPARISON:  None. FINDINGS: Brain: No acute abnormality including hemorrhage, infarct, mass lesion, mass effect, midline shift or abnormal extra-axial fluid collection. No hydrocephalus or pneumocephalus. Vascular: Mild atherosclerosis noted. Skull: Intact and normal appearance. Sinuses/Orbits: Negative. Other: None. IMPRESSION: No acute abnormality.  Mild atherosclerosis. Electronically Signed   By: Inge Rise M.D.   On: 10/25/2016 12:54   Mr Brain Wo Contrast  Result Date: 10/25/2016 CLINICAL DATA:  Initial evaluation for acute headache, slurred speech. EXAM: MRI HEAD WITHOUT CONTRAST TECHNIQUE: Multiplanar, multiecho pulse sequences of the brain and surrounding structures were obtained without intravenous contrast. COMPARISON:  Prior CT from earlier the same day. FINDINGS: Brain: Age appropriate cerebral atrophy present. Mild patchy T2/FLAIR hyperintensity within the periventricular and deep white matter both cerebral hemispheres most compatible chronic small vessel ischemic disease, felt to be within normal limits for age. No abnormal foci of restricted diffusion to suggest acute or subacute ischemia. Gray-white matter differentiation maintained. No evidence for acute or chronic intracranial hemorrhage. No encephalomalacia to suggest chronic infarction. No mass lesion, midline shift, or mass effect. Ventricles normal in size without evidence for hydrocephalus. No extra-axial fluid  collection. Major dural sinuses are grossly patent. Pituitary gland within normal limits. Vascular: Major intracranial vascular flow voids are maintained. Skull and upper cervical spine: Craniocervical junction normal. Bone marrow signal intensity within normal limits. No scalp soft tissue abnormality. Sinuses/Orbits: Globes and orbital soft tissues within normal limits. Patient is status post cataract extraction bilaterally. Paranasal sinuses are largely clear. No mastoid effusion. Inner ear structures grossly normal. IMPRESSION: Normal brain MRI for patient age. No acute intracranial infarct or other process identified. Electronically Signed   By: Jeannine Boga M.D.   On: 10/25/2016 15:32    Procedures Procedures (including critical care time)  Medications Ordered in ED Medications  LORazepam (ATIVAN) injection 1 mg (1 mg Intramuscular Given 10/25/16 1428)  amoxicillin (AMOXIL) capsule 500 mg (500 mg Oral Given 10/25/16 1618)     Initial Impression / Assessment and Plan / ED Course  I have reviewed the triage vital signs and the nursing notes.  Pertinent labs & imaging results that were available during my care of the patient were reviewed by me and considered in my medical decision making (see chart for details).  Clinical Course    70 year old female here with headache secondary to a sinus infection. No evidence of stroke or metastatic cancer on the MRI. Has some chest discomfort with this to but this is more likely related to postnasal drip and coughing. We'll treat with antibiotics as it has been going on for 3 weeks and is more likely to be bacterial at this point. Heart has appointment with her primary doctor in 2 days. We'll defer to them for further workup if still symptomatic at that time.  Final Clinical Impressions(s) / ED Diagnoses   Final diagnoses:  Nonintractable headache, unspecified chronicity pattern, unspecified headache type    New Prescriptions Discharge  Medication List as of 10/25/2016  4:38 PM    START taking these medications   Details  amoxicillin (AMOXIL) 500 MG capsule Take 1 capsule (500 mg total) by mouth 3 (three) times daily., Starting Tue 10/25/2016, Print    guaiFENesin (ROBITUSSIN) 100 MG/5ML liquid Take 5-10 mLs (100-200 mg total) by mouth 3 (three) times daily as needed for congestion., Starting Tue 10/25/2016, Print         Merrily Pew, MD 10/25/16 [phone]

## 2016-10-25 NOTE — ED Triage Notes (Signed)
Pt reports being seen on 12/11 and diagnosed with new onset atrial fib. Pt was started on eliquis. Pt reports having headache, dizziness, fatigue and "feels foggy headed". ekg done at triage. No resp distress noted.

## 2016-10-28 ENCOUNTER — Other Ambulatory Visit: Payer: Self-pay | Admitting: Geriatric Medicine

## 2016-10-28 DIAGNOSIS — I480 Paroxysmal atrial fibrillation: Secondary | ICD-10-CM

## 2016-11-01 ENCOUNTER — Telehealth (HOSPITAL_COMMUNITY): Payer: Self-pay | Admitting: *Deleted

## 2016-11-01 NOTE — Telephone Encounter (Signed)
Pt cld back and stated that she is not taking the blood thinner because she feels she was misdiagnosed and has decided that she is only going to take her lorazepam.  I explained that the eliquis is a blood thinner. Pt will still schedule.

## 2016-11-01 NOTE — Telephone Encounter (Signed)
LMOM for pt to [facility] and sched f/u appt with afib clinic

## 2016-11-04 ENCOUNTER — Encounter (HOSPITAL_COMMUNITY): Payer: Self-pay | Admitting: Nurse Practitioner

## 2016-11-04 ENCOUNTER — Ambulatory Visit (HOSPITAL_COMMUNITY)
Admission: RE | Admit: 2016-11-04 | Discharge: 2016-11-04 | Disposition: A | Discharge status: Home / Self Care | Payer: Medicare Other | Source: Ambulatory Visit | Attending: Nurse Practitioner | Admitting: Nurse Practitioner

## 2016-11-04 VITALS — BP 132/78 | HR 74 | Ht 67.0 in | Wt 132.4 lb

## 2016-11-04 DIAGNOSIS — Z87891 Personal history of nicotine dependence: Secondary | ICD-10-CM | POA: Diagnosis not present

## 2016-11-04 DIAGNOSIS — E7800 Pure hypercholesterolemia, unspecified: Secondary | ICD-10-CM | POA: Insufficient documentation

## 2016-11-04 DIAGNOSIS — I480 Paroxysmal atrial fibrillation: Secondary | ICD-10-CM | POA: Insufficient documentation

## 2016-11-04 DIAGNOSIS — Z85048 Personal history of other malignant neoplasm of rectum, rectosigmoid junction, and anus: Secondary | ICD-10-CM | POA: Insufficient documentation

## 2016-11-04 DIAGNOSIS — M8580 Other specified disorders of bone density and structure, unspecified site: Secondary | ICD-10-CM | POA: Insufficient documentation

## 2016-11-04 NOTE — Progress Notes (Signed)
Primary Care Physician: Mathews Argyle, MD Referring Physician: [facility] ER f/u    Julie Mann is a 70 y.o. female in the afib clinic for f/u of ER visit for new onset atrial fibrillation which pt believes was caused by use of flonase. 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 She was also given eliquis and has not started for CHA2DS2VASc score of at least 2. She states she has reactions to many drugs. Emphasized her risk of stroke/ need for drug and she indicated that she would start. She also has had H/A's recently and was seen in the ER with normal studies.   Today, she denies symptoms of palpitations, chest pain, shortness of breath, orthopnea, PND, lower extremity edema, dizziness, presyncope, syncope, or neurologic sequela. The patient is tolerating medications without difficulties and is otherwise without complaint today.   Past Medical History:  Diagnosis Date  . 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 Arthritis    "hands, fingers, back" (02/27/2014)  . Atrial fibrillation ([place])   . Cat scratch of right hand 02/24/2014  . High cholesterol   . History of stomach ulcers ~ 1966  . Osteopenia    Past Surgical History:  Procedure Laterality Date  . ANUS SURGERY  2005   "biopsy"  . BREAST CYST EXCISION  1966  . DILATION AND CURETTAGE OF UTERUS  1980's   S/P miscarriage  . VARICOSE VEIN SURGERY Left    left leg    Current Outpatient Prescriptions  Medication Sig Dispense Refill  . conjugated estrogens (PREMARIN) vaginal cream Place 1 Applicatorful vaginally daily. 30 g 1  . LORazepam (ATIVAN) 1 MG tablet TAKE ONE-HALF (1/2) TO ONE (1) TABLET BYMOUTH ONCE A DAY AS NEEDED FOR ANXIETY 30 tablet 0  . apixaban (ELIQUIS) 5 MG TABS tablet Take 1 tablet (5 mg total) by mouth 2 (two) times daily. (Patient not taking:  Reported on 11/04/2016) 60 tablet 0  . levothyroxine (SYNTHROID, LEVOTHROID) 50 MCG tablet Take 1 tablet (50 mcg total) by mouth daily before breakfast. (Patient not taking: Reported on 11/04/2016) 30 tablet 0   No current facility-administered medications for this encounter.     Allergies  Allergen Reactions  . Doxycycline Other (See Comments)    Dizziness and tingling  . Erythromycin Other (See Comments)    hallucinations  . Flonase [Fluticasone Propionate]   . Ivp Dye [Iodinated Diagnostic Agents]   . Keflex [Cephalexin] Other (See Comments)    Hallucinations.  . Premarin [Conjugated Estrogens]     Felt weird  . Provera [Medroxyprogesterone Acetate]   . Zithromax [Azithromycin] Other (See Comments)    hallucinations  . Zoloft [Sertraline Hcl] Other (See Comments)    sedation    Social History   Social History  . Marital status: Married    Spouse name: N/A  . Number of children: N/A  . Years of education: N/A   Occupational History  . Not on file.   Social History Main Topics  . Smoking status: Former Smoker    Packs/day: 1.00    Years: 30.00    Types: Cigarettes  . Smokeless tobacco: Never Used  . Alcohol use No  . Drug use: No  . Sexual activity: Yes    Partners: Male    Birth control/ protection: Post-menopausal   Other Topics  Concern  . Not on file   Social History Narrative  . No narrative on file    Family History  Problem Relation Age of Onset  . Breast cancer Maternal Aunt   . Breast cancer Maternal Grandmother     ROS- All systems are reviewed and negative except as per the HPI above  Physical Exam: Vitals:   11/04/16 1018  BP: 132/78  Pulse: 74  Weight: 132 lb 6.4 oz (60.1 kg)  Height: 5\' 7"  (1.702 m)   Wt Readings from Last 3 Encounters:  11/04/16 132 lb 6.4 oz (60.1 kg)  07/30/15 132 lb (59.9 kg)  06/26/14 126 lb (57.2 kg)    Labs: Lab Results  Component Value Date   NA 138 10/25/2016   K 3.9 10/25/2016   CL 105  10/25/2016   CO2 23 10/25/2016   GLUCOSE 139 (H) 10/25/2016   BUN 10 10/25/2016   CREATININE 0.93 10/25/2016   CALCIUM 9.4 10/25/2016   No results found for: INR No results found for: CHOL, HDL, LDLCALC, TRIG   GEN- The patient is well appearing, alert and oriented x 3 today.   Head- normocephalic, atraumatic Eyes-  Sclera clear, conjunctiva pink Ears- hearing intact Oropharynx- clear Neck- supple, no JVP Lymph- no cervical lymphadenopathy Lungs- Clear to ausculation bilaterally, normal work of breathing Heart- Regular rate and rhythm, no murmurs, rubs or gallops, PMI not laterally displaced GI- soft, NT, ND, + BS Extremities- no clubbing, cyanosis, or edema MS- no significant deformity or atrophy Skin- no rash or lesion Psych- euthymic mood, full affect Neuro- strength and sensation are intact  EKG- NSR, 74 bpm, pr int 152 ms, qrs int 82 ms, qtc 441 ms Epic records reviewed   Assessment and Plan: 1. Paroxysmal afib General education re afib discussed Encouraged to start eliquis for risk reduction for stroke Bleeding precautions discussed Echo pending Denies any alcohol, tobacco, caffeine, snoring Admits  increased stress  Will f/u in 3-4 weeks  Butch Penny C. Ulysee Fyock, [facility] [address] [place], [place] [address] [phone]

## 2016-11-17 ENCOUNTER — Other Ambulatory Visit: Payer: Self-pay

## 2016-11-17 ENCOUNTER — Ambulatory Visit (HOSPITAL_COMMUNITY): Payer: Medicare Other | Attending: Cardiovascular Disease

## 2016-11-17 DIAGNOSIS — Z923 Personal history of irradiation: Secondary | ICD-10-CM | POA: Insufficient documentation

## 2016-11-17 DIAGNOSIS — Z9221 Personal history of antineoplastic chemotherapy: Secondary | ICD-10-CM | POA: Insufficient documentation

## 2016-11-17 DIAGNOSIS — Z87891 Personal history of nicotine dependence: Secondary | ICD-10-CM | POA: Insufficient documentation

## 2016-11-17 DIAGNOSIS — I480 Paroxysmal atrial fibrillation: Secondary | ICD-10-CM

## 2016-11-17 DIAGNOSIS — C210 Malignant neoplasm of anus, unspecified: Secondary | ICD-10-CM | POA: Diagnosis not present

## 2016-11-17 DIAGNOSIS — E785 Hyperlipidemia, unspecified: Secondary | ICD-10-CM | POA: Diagnosis not present

## 2016-11-22 ENCOUNTER — Encounter (HOSPITAL_COMMUNITY): Payer: Self-pay | Admitting: Nurse Practitioner

## 2016-11-22 ENCOUNTER — Other Ambulatory Visit: Payer: Self-pay

## 2016-11-22 ENCOUNTER — Ambulatory Visit (HOSPITAL_COMMUNITY)
Admission: RE | Admit: 2016-11-22 | Discharge: 2016-11-22 | Disposition: A | Discharge status: Home / Self Care | Payer: Medicare Other | Source: Ambulatory Visit | Attending: Nurse Practitioner | Admitting: Nurse Practitioner

## 2016-11-22 VITALS — BP 146/78 | HR 94 | Ht 67.0 in | Wt 134.0 lb

## 2016-11-22 DIAGNOSIS — Z888 Allergy status to other drugs, medicaments and biological substances status: Secondary | ICD-10-CM | POA: Diagnosis not present

## 2016-11-22 DIAGNOSIS — Z87891 Personal history of nicotine dependence: Secondary | ICD-10-CM | POA: Insufficient documentation

## 2016-11-22 DIAGNOSIS — Z91041 Radiographic dye allergy status: Secondary | ICD-10-CM | POA: Diagnosis not present

## 2016-11-22 DIAGNOSIS — Z85048 Personal history of other malignant neoplasm of rectum, rectosigmoid junction, and anus: Secondary | ICD-10-CM | POA: Diagnosis not present

## 2016-11-22 DIAGNOSIS — I480 Paroxysmal atrial fibrillation: Secondary | ICD-10-CM | POA: Insufficient documentation

## 2016-11-22 DIAGNOSIS — Z881 Allergy status to other antibiotic agents status: Secondary | ICD-10-CM | POA: Diagnosis not present

## 2016-11-22 DIAGNOSIS — M1990 Unspecified osteoarthritis, unspecified site: Secondary | ICD-10-CM | POA: Diagnosis not present

## 2016-11-22 NOTE — Progress Notes (Signed)
Primary Care Physician: Mathews Argyle, MD Referring Physician: [facility] ER f/u    Julie Mann is a 71 y.o. female in the afib clinic for f/u of ER visit for new onset atrial fibrillation which pt believes was caused by use of flonase. 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 She was also given eliquis and has not started for CHA2DS2VASc score of at least 2. She states she has reactions to many drugs. Emphasized her risk of stroke/ need for drug and she indicated that she would start. She also has had H/A's recently and was seen in the ER with normal studies.   She returns to afib clinic 1/9 and had an echo which was unremarkable. She has not had any further afib. She decided not to start anticoagulation despite being informed of risk of stroke. She states that she has had an H/A x 6 weeks and did not want to start a new drug in the setting of chronic h/a which is unusual for her.. She has had a MRI and CT which were unremarkable. The Flonase that pt claims gave her afib was prescribed initially for sinus h/a. She also took amoxicillin x 5 days until she started itching and stopped drugs. She is allergic to many meds. She thinks  is going to see an ENT as next step as she thinks the H/a's are sinus related.  Today, she denies symptoms of palpitations, chest pain, shortness of breath, orthopnea, PND, lower extremity edema, dizziness, presyncope, syncope, or neurologic sequela. The patient is tolerating medications without difficulties and is otherwise without complaint today.   Past Medical History:  Diagnosis Date  . 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 Arthritis    "hands, fingers, back" (02/27/2014)  . Atrial fibrillation ([place])   . Cat scratch of right hand 02/24/2014  . High cholesterol   . History of stomach ulcers ~ 1966   . Osteopenia    Past Surgical History:  Procedure Laterality Date  . ANUS SURGERY  2005   "biopsy"  . BREAST CYST EXCISION  1966  . DILATION AND CURETTAGE OF UTERUS  1980's   S/P miscarriage  . VARICOSE VEIN SURGERY Left    left leg    Current Outpatient Prescriptions  Medication Sig Dispense Refill  . conjugated estrogens (PREMARIN) vaginal cream Place 1 Applicatorful vaginally daily. 30 g 1  . LORazepam (ATIVAN) 1 MG tablet TAKE ONE-HALF (1/2) TO ONE (1) TABLET BYMOUTH ONCE A DAY AS NEEDED FOR ANXIETY 30 tablet 0   No current facility-administered medications for this encounter.     Allergies  Allergen Reactions  . Doxycycline Other (See Comments)    Dizziness and tingling  . Erythromycin Other (See Comments)    hallucinations  . Flonase [Fluticasone Propionate]   . Ivp Dye [Iodinated Diagnostic Agents]   . Keflex [Cephalexin] Other (See Comments)    Hallucinations.  . Premarin [Conjugated Estrogens]     Felt weird  . Provera [Medroxyprogesterone Acetate]   . Zithromax [Azithromycin] Other (See Comments)    hallucinations  . Zoloft [Sertraline Hcl] Other (See Comments)    sedation    Social History   Social History  . Marital status: Married    Spouse name: N/A  . Number of children: N/A  . Years of education: N/A  Occupational History  . Not on file.   Social History Main Topics  . Smoking status: Former Smoker    Packs/day: 1.00    Years: 30.00    Types: Cigarettes  . Smokeless tobacco: Never Used  . Alcohol use No  . Drug use: No  . Sexual activity: Yes    Partners: Male    Birth control/ protection: Post-menopausal   Other Topics Concern  . Not on file   Social History Narrative  . No narrative on file    Family History  Problem Relation Age of Onset  . Breast cancer Maternal Aunt   . Breast cancer Maternal Grandmother     ROS- All systems are reviewed and negative except as per the HPI above  Physical Exam: Vitals:   11/22/16  0916  BP: (!) 146/78  Pulse: 94  Weight: 134 lb (60.8 kg)  Height: 5\' 7"  (1.702 m)   Wt Readings from Last 3 Encounters:  11/22/16 134 lb (60.8 kg)  11/04/16 132 lb 6.4 oz (60.1 kg)  07/30/15 132 lb (59.9 kg)    Labs: Lab Results  Component Value Date   NA 138 10/25/2016   K 3.9 10/25/2016   CL 105 10/25/2016   CO2 23 10/25/2016   GLUCOSE 139 (H) 10/25/2016   BUN 10 10/25/2016   CREATININE 0.93 10/25/2016   CALCIUM 9.4 10/25/2016   No results found for: INR No results found for: CHOL, HDL, LDLCALC, TRIG   GEN- The patient is well appearing, alert and oriented x 3 today.   Head- normocephalic, atraumatic Eyes-  Sclera clear, conjunctiva pink Ears- hearing intact Oropharynx- clear Neck- supple, no JVP Lymph- no cervical lymphadenopathy Lungs- Clear to ausculation bilaterally, normal work of breathing Heart- Regular rate and rhythm, soft S1 systolic murmur no  rubs or gallops, PMI not laterally displaced GI- soft, NT, ND, + BS Extremities- no clubbing, cyanosis, or edema MS- no significant deformity or atrophy Skin- no rash or lesion Psych- euthymic mood, full affect Neuro- strength and sensation are intact  EKG- NSR, 94 bpm, pr int 156 ms, qrs int 82 ms, qtc 445 ms Epic records reviewed Echo- Study Conclusions  - Left ventricle: The cavity size was normal. Systolic function was   vigorous. The estimated ejection fraction was in the range of 65%   to 70%. Wall motion was normal; there were no regional wall   motion abnormalities. Features are consistent with a pseudonormal   left ventricular filling pattern, with concomitant abnormal   relaxation and increased filling pressure (grade 2 diastolic   dysfunction). Doppler parameters are consistent with   indeterminate ventricular filling pressure. - Aortic valve: Transvalvular velocity was within the normal range.   There was no stenosis. There was no regurgitation. - Mitral valve: Transvalvular velocity was  within the normal range.   There was no evidence for stenosis. There was trivial   regurgitation. - Right ventricle: The cavity size was normal. Wall thickness was   normal. Systolic function was normal. - Tricuspid valve: There was trivial regurgitation. - Pulmonary arteries: Systolic pressure was within the normal   range. PA peak pressure: 27 mm Hg (S).     Assessment and Plan: 1. Paroxysmal afib No further reoccurrence Pt thinks triggered by flonase General education re afib discussed Encouraged to start eliquis for risk reduction for stroke, pt declines, but has drug and will start if further episodes of afib  afib clinic as needed  Butch Penny C. Kushal Saunders, [place]  [facility] [address] [place], [place] [address] [phone]

## 2016-11-25 ENCOUNTER — Encounter: Payer: Self-pay | Admitting: Obstetrics & Gynecology

## 2016-11-25 ENCOUNTER — Ambulatory Visit (INDEPENDENT_AMBULATORY_CARE_PROVIDER_SITE_OTHER): Payer: Medicare Other | Admitting: Obstetrics & Gynecology

## 2016-11-25 VITALS — BP 126/68 | HR 80 | Resp 16 | Ht 67.0 in | Wt 132.0 lb

## 2016-11-25 DIAGNOSIS — C210 Malignant neoplasm of anus, unspecified: Secondary | ICD-10-CM

## 2016-11-25 DIAGNOSIS — Z01419 Encounter for gynecological examination (general) (routine) without abnormal findings: Secondary | ICD-10-CM | POA: Diagnosis not present

## 2016-11-25 DIAGNOSIS — Z205 Contact with and (suspected) exposure to viral hepatitis: Secondary | ICD-10-CM | POA: Diagnosis not present

## 2016-11-25 MED ORDER — DIAZEPAM 2 MG PO TABS: 2.0000 mg | ORAL_TABLET | Freq: Two times a day (BID) | ORAL | 0 refills | Status: DC | PRN

## 2016-11-25 NOTE — Progress Notes (Signed)
71 y.o. [id number] Married Caucasian F here for annual exam.  Pt reports she had a headache in the beginning of December.  She was given claritin.  This didn't work and was given flonase.  This put her into afib.  She went to the ER.  She was treated and she came out of afib in about 20 minutes.  She was started on Eliquis.  She felt "terrible" on this and took it only for two days.    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  she still continues to have a headache.  Reports this is like a band around her head.  She is also feeling like her ears are "stopped up" and she does have a runny nose now.  Reports she does have an appt with Dr. Radene Journey this afternoon.    Now having some abdominal pain that she feels is stress related.     Patient's last menstrual period was 11/14/1992.          Sexually active: Yes.    The current method of family planning is post menopausal status.    Exercising: Yes.    walking, exercise routine, horseback riding Smoker:  Former smoker  Health Maintenance: Pap:  07/30/15 negative  History of abnormal Pap:  no MMG:  02/18/16 BIRADS 1 negative  Colonoscopy:  8/16- repeat 5 years BMD:   01/08/13 osteopenia  TDaP:  2016 Pneumovax:  2011 Prevnar 13 : 2015 Zostavax:   2007 Hep C testing: today Screening Labs: PCP, Hb today: PCP   reports that she has quit smoking. Her smoking use included Cigarettes. She has a 30.00 pack-year smoking history. She has never used smokeless tobacco. She reports that she does not drink alcohol or use drugs.  Past Medical History:  Diagnosis Date  . 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 Arthritis    "hands, fingers, back" (02/27/2014)  . Atrial fibrillation ([place])   . Cat scratch of right hand 02/24/2014  . High cholesterol   . History of stomach ulcers ~ 1966  . Osteopenia     Past Surgical History:  Procedure Laterality Date  . ANUS SURGERY  2005   "biopsy"  . BREAST CYST EXCISION  1966   . DILATION AND CURETTAGE OF UTERUS  1980's   S/P miscarriage  . VARICOSE VEIN SURGERY Left    left leg    Current Outpatient Prescriptions  Medication Sig Dispense Refill  . conjugated estrogens (PREMARIN) vaginal cream Place 1 Applicatorful vaginally daily. 30 g 1  . LORazepam (ATIVAN) 1 MG tablet TAKE ONE-HALF (1/2) TO ONE (1) TABLET BYMOUTH ONCE A DAY AS NEEDED FOR ANXIETY 30 tablet 0   No current facility-administered medications for this visit.     Family History  Problem Relation Age of Onset  . Breast cancer Maternal Aunt   . Breast cancer Maternal Grandmother     ROS:  Pertinent items are noted in HPI.  Otherwise, a comprehensive ROS was negative.  Exam:   Vitals:   11/25/16 1027  BP: 126/68  Pulse: 80  Resp: 16   General appearance: alert, cooperative and appears stated age Head: Normocephalic, without obvious abnormality, atraumatic Neck: no adenopathy, supple, symmetrical, trachea midline and thyroid normal to inspection and palpation Lungs: clear to auscultation bilaterally Breasts: normal appearance, no masses or tenderness Heart: regular rate and rhythm Abdomen: soft, non-tender; bowel sounds normal; no masses,  no organomegaly Extremities: extremities normal, atraumatic, no  cyanosis or edema Skin: Skin color, texture, turgor normal. No rashes or lesions Lymph nodes: Cervical, supraclavicular, and axillary nodes normal. No abnormal inguinal nodes palpated Neurologic: Grossly normal   Pelvic: External genitalia:  no lesions              Urethra:  normal appearing urethra with no masses, tenderness or lesions              Bartholins and Skenes: normal                 Vagina: normal appearing vagina with normal color and discharge, no lesions              Cervix: no lesions              Pap taken: Yes.   Bimanual Exam:  Uterus:  normal size, contour, position, consistency, mobility, non-tender              Adnexa: normal adnexa and no mass, fullness,  tenderness               Rectovaginal: Confirms               Anus:  normal sphincter tone, no lesions  Chaperone was present for exam.  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  Uses Lorazepam infrequently. Chronic diarrhea. Followed by Dr. Cristina Gong Headache, sinus vs stressor related  P:  Mammogram yearly  pap smear yearly  Seeing Dr. Lucia Gaskins this afternoon.  Will start Valium 2mg  bid.  #60/0RF.  She may need to see headache specialists Hep C antibody Return annually or prn

## 2016-11-26 LAB — HEPATITIS C ANTIBODY: HCV Ab: NEGATIVE

## 2016-11-28 ENCOUNTER — Telehealth: Payer: Self-pay | Admitting: *Deleted

## 2016-11-28 NOTE — Telephone Encounter (Signed)
-----   Message from Megan Salon, MD sent at 11/26/2016  8:25 AM EST ----- Inform pt her Hep C testing was negative

## 2016-11-28 NOTE — Telephone Encounter (Signed)
Call to patient. Results reviewed with patient and she verbalized understanding. Patient states she was going to call our office today, but didn't know if we were open. Patient states that she was seen in our office on Friday, and Friday night she took a dose of the valium Dr. Sabra Heck prescribed. Patient states she feels it had the opposite effect on her. Patient states it kept her up all night. She states she just feels "like a lump, and I don't even know how to describe. I just feel lethargic." Patient states she doesn't plan on taking anymore of the valium. RN advised this message would be sent to Dr. Sabra Heck for review and our office would call with any additional recommendations.    Routing to provider for review.

## 2016-11-29 NOTE — Telephone Encounter (Signed)
She was going to see Dr Lucia Gaskins.  Did he give her any new recommendations.  Does she want me to refer her to a neurologist regarding her persistent headache.  That was our plan at her visit if Dr. Lucia Gaskins did not make any new recommendations to her.  Thanks.

## 2016-11-30 LAB — IPS PAP SMEAR ONLY

## 2016-12-02 ENCOUNTER — Other Ambulatory Visit: Payer: Self-pay | Admitting: Obstetrics & Gynecology

## 2016-12-02 MED ORDER — LORAZEPAM 1 MG PO TABS: ORAL_TABLET | ORAL | 0 refills | Status: DC

## 2016-12-02 NOTE — Telephone Encounter (Signed)
Rx completed and Kaitlyn faxed to pharmacy and notified pt.

## 2016-12-02 NOTE — Telephone Encounter (Signed)
Patient reports only having a few tablets of Lorazepam left in her rx. Okay to refill #30 0RF?

## 2016-12-02 NOTE — Telephone Encounter (Signed)
Spoke with patient. Patient states that she was seen with Dr.Newman and no new recommendations were made. Patient states since talking with Raquel Sarna she was seen with her PCP and started on Paxil and melatonin. States she had diarrhea with Paxil and was still unable to sleep with melatonin. Has discontinued both of these medications. States she took Lorazepam .5 mg on Wednesday night and slept well through the night. Took Lorazepam .5 mg today and feels well without anxiety. States her headaches have gotten better and does not feel she needs a referral to a neurologist. "I feel this is all due to anxiety and not getting enough sleep." Again declines referral for headaches. Asking for recommendations from [place]. Advised I will review with Dr.Miller and return call. Patient is agreeable.

## 2016-12-02 NOTE — Telephone Encounter (Signed)
OK to continue the lorazepam as she is taking it.  BID as needed.  I do not have any additional recommendations at this time.

## 2016-12-02 NOTE — Telephone Encounter (Signed)
Incoming fax requesting lorazepam 1mg  tab  Medication refill request: ativan  Last AEX:  11/25/16  Next AEX: 02/23/18  Last MMG (if hormonal medication request): 02/18/16 BIRADS1:Neg  Refill authorized: 09/16/16 #[id number]. Today please advise.

## 2016-12-02 NOTE — Telephone Encounter (Signed)
Routed to triage 

## 2016-12-02 NOTE — Telephone Encounter (Signed)
Patient would like to speak to you about the message she left earlier this week regarding the medication.

## 2016-12-02 NOTE — Telephone Encounter (Signed)
Spoke with patient. Advised of message as seen below from [place]. Patient is agreeable. Rx for Lorazepam 1 mg #0 0RF faxed to [facility] on file with cover sheet and confirmation.  Routing to provider for final review. Patient agreeable to disposition. Will close encounter.

## 2016-12-14 ENCOUNTER — Telehealth: Payer: Self-pay | Admitting: Obstetrics & Gynecology

## 2016-12-14 NOTE — Telephone Encounter (Signed)
Spoke with patient. Patient states that she is doing well with taking Lorazepam and feels much better. Patient has medication questions that she would like to discuss with Dr.Miller personally. Declines any active problems or side effects at this time. Appointment scheduled for tomorrow 12/15/2016 at 4:15 pm with Dr.Miller. Patient is agreeable to date and time.  Routing to provider for final review. Patient agreeable to disposition. Will close encounter.

## 2016-12-14 NOTE — Telephone Encounter (Signed)
Patient is calling to discuss how her medication is working. She thinks she may need an appointment.

## 2016-12-14 NOTE — Telephone Encounter (Signed)
Left message to call Cephus Tupy at [phone]. 

## 2016-12-15 ENCOUNTER — Ambulatory Visit (INDEPENDENT_AMBULATORY_CARE_PROVIDER_SITE_OTHER): Payer: Medicare Other | Admitting: Obstetrics & Gynecology

## 2016-12-15 VITALS — BP 140/84 | HR 80 | Resp 20 | Ht 67.0 in | Wt 132.2 lb

## 2016-12-15 DIAGNOSIS — F4322 Adjustment disorder with anxiety: Secondary | ICD-10-CM | POA: Diagnosis not present

## 2016-12-15 DIAGNOSIS — G44229 Chronic tension-type headache, not intractable: Secondary | ICD-10-CM | POA: Diagnosis not present

## 2016-12-15 MED ORDER — LORAZEPAM 1 MG PO TABS: ORAL_TABLET | ORAL | 0 refills | Status: DC

## 2016-12-15 MED ORDER — IBUPROFEN 600 MG PO TABS: 600.0000 mg | ORAL_TABLET | Freq: Three times a day (TID) | ORAL | 1 refills | Status: DC | PRN

## 2016-12-15 NOTE — Progress Notes (Signed)
GYNECOLOGY  VISIT   HPI: 71 y.o. [id number] Married Caucasian female with anxiety, recent sinus issues, and headache here for follow up.  Pt was seen on 11/25/16 and reported issues with headache since December.  She was treated with claritin and then flonase.  She went into paroxysmal afib.  Ended up being treated in the ER and came out of a fib after being in this for 20 minutes.  She has followed up with Roderic Palau, NP, in cardiology.  Eliquis was recommended but pt is not going to take it at this time.  Is very sensitive to medication and just does not feel she needs it.  She is clearly aware if she went back into afib she has risk for stroke.  States she carries the eliquis in her purse with her at all times and would start it if she went back into afib.  Reports seeing Dr. Wende Bushy 11/29/16 and he was displeased that she had not seen an MD.  She will be seeing her sister's cardiologist, Dr. Daneen Schick, on 12/21/16.  States she thinks this is completely overkill but she will go.  When I saw her 11/25/16, she was having a lot of anxiety about her persistent headache and recent cardiac issues as well as not feeling like she was "being heard" by her PCP.  She was started on Valium but did not feel this helped and so stopped it.  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  She thinks some of her headache was attributed to that because she is feeling much, much better.  She is now taking 1/2 tab of 1mg  Lorazepam at night and 1/4 of a tablet in the day if needed.  For the past week or so, she is only taking it at night.  Feels her anxiety has improved as well.  She thinks just starting to feel better from a sinus and headache standpoint has really helped.  She is sleeping and this has helped as well.  Wants to discuss changing PCPs.  Has an idea who she would like to see but this person is in same practice.  Advised pt to call office administrator and just have a conversation with this person to see  if changing is possible.  Advised I will help with referral if she needs.  She knows to call.  Declines referral to psychiatrist for anxiety.  Feels it is improving and she does not need another specialist.  GYNECOLOGIC HISTORY: Patient's last menstrual period was 11/14/1992. Contraception: PMP  Patient Active Problem List   Diagnosis Date Noted  . Rectal cancer ([place]) 07/30/2015    Past Medical History:  Diagnosis Date  . 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 Arthritis    "hands, fingers, back" (02/27/2014)  . Atrial fibrillation ([place])   . Cat scratch of right hand 02/24/2014  . High cholesterol   . History of stomach ulcers ~ 1966  . Osteopenia     Past Surgical History:  Procedure Laterality Date  . ANUS SURGERY  2005   "biopsy"  . BREAST CYST EXCISION  1966  . DILATION AND CURETTAGE OF UTERUS  1980's   S/P miscarriage  . VARICOSE VEIN SURGERY Left    left leg    MEDS:  Reviewed in EPIC and UTD  ALLERGIES: Doxycycline; Erythromycin; Flonase [fluticasone propionate]; Ivp dye [iodinated diagnostic agents]; Keflex [cephalexin]; Paxil [paroxetine hcl]; Premarin [conjugated estrogens]; Provera [medroxyprogesterone acetate]; Zithromax [azithromycin]; and Zoloft [  sertraline hcl]  Family History  Problem Relation Age of Onset  . Breast cancer Maternal Aunt   . Breast cancer Maternal Grandmother     SH:  Married, non smoker  Review of Systems  Constitutional: Negative.   Respiratory: Negative.   Cardiovascular: Negative.   Neurological: Positive for headaches (but improving). Negative for dizziness and seizures.  Psychiatric/Behavioral: The patient is nervous/anxious.    PHYSICAL EXAMINATION:    BP 140/84 (BP Location: Right Arm, Patient Position: Sitting, Cuff Size: Normal)   Pulse 80   Resp 20   Ht 5\' 7"  (1.702 m)   Wt 132 lb 3.2 oz (60 kg)   LMP 11/14/1992   BMI 20.71 kg/m     Physical Exam  Constitutional: She is oriented to  person, place, and time. She appears well-developed and well-nourished.  Cardiovascular: Normal rate and regular rhythm.   Pulmonary/Chest: Effort normal.  Neurological: She is alert and oriented to person, place, and time.  Skin: Skin is warm and dry.  Psychiatric: She has a normal mood and affect. Her behavior is normal. Judgment and thought content normal.    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 #30/0RF.  Pt will need appt for RF if within six months as this would be increased use for her compared to past years.  Pt comfortable with this plan. Information for new PCP given.   ~30 minutes spent with patient >50% of time was in face to face discussion of above.

## 2016-12-17 ENCOUNTER — Encounter: Payer: Self-pay | Admitting: Obstetrics & Gynecology

## 2016-12-17 DIAGNOSIS — F419 Anxiety disorder, unspecified: Secondary | ICD-10-CM | POA: Insufficient documentation

## 2016-12-17 DIAGNOSIS — F325 Major depressive disorder, single episode, in full remission: Secondary | ICD-10-CM | POA: Insufficient documentation

## 2016-12-17 DIAGNOSIS — E7800 Pure hypercholesterolemia, unspecified: Secondary | ICD-10-CM | POA: Insufficient documentation

## 2016-12-17 DIAGNOSIS — M8580 Other specified disorders of bone density and structure, unspecified site: Secondary | ICD-10-CM | POA: Insufficient documentation

## 2016-12-17 DIAGNOSIS — I480 Paroxysmal atrial fibrillation: Secondary | ICD-10-CM | POA: Insufficient documentation

## 2016-12-17 DIAGNOSIS — K219 Gastro-esophageal reflux disease without esophagitis: Secondary | ICD-10-CM | POA: Insufficient documentation

## 2016-12-21 ENCOUNTER — Encounter: Payer: Self-pay | Admitting: Interventional Cardiology

## 2016-12-21 ENCOUNTER — Ambulatory Visit (INDEPENDENT_AMBULATORY_CARE_PROVIDER_SITE_OTHER): Payer: Medicare Other | Admitting: Interventional Cardiology

## 2016-12-21 VITALS — BP 132/70 | HR 82 | Ht 67.0 in | Wt 131.4 lb

## 2016-12-21 DIAGNOSIS — I480 Paroxysmal atrial fibrillation: Secondary | ICD-10-CM | POA: Diagnosis not present

## 2016-12-21 DIAGNOSIS — C20 Malignant neoplasm of rectum: Secondary | ICD-10-CM | POA: Diagnosis not present

## 2016-12-21 MED ORDER — ASPIRIN EC 81 MG PO TBEC: 81.0000 mg | DELAYED_RELEASE_TABLET | Freq: Every day | ORAL | 3 refills | Status: DC

## 2016-12-21 NOTE — Progress Notes (Signed)
Cardiology Office Note    Date:  12/21/2016   ID:  Julie Mann [birth year], MRN [id number]  PCP:  Julie Argyle, MD  Cardiologist: Julie Grooms, MD   Chief Complaint  Patient presents with  . Atrial Fibrillation    History of Present Illness:  Julie Mann is a 71 y.o. female paroxysmal atrial fibrillation that required diltiazem and flecainide for conversion during an emergency room visit in late 2017. CHADS VASC score is 2  The patient developed atrial fibrillation on December 10 and was treated in an ambulatory fashion in the emergency room with diltiazem and flecainide. She had conversion and was discharged home. Total duration of atrial fibrillation was approximately 2 hours. She has been seen in the atrial fibrillation clinic and also by her primary physician, Dr. Lajean Mann. She has refused anticoagulation therapy with Eliquis.  She has no cardiac complaints. Atrial fibrillation cause her to feel extremely weak, she noted palpitations, and there was chest pressure. She has never before or since had similar complaints.  She is very anxious.  We discussed untreated atrial fibrillation and the risk of stroke. I quoted a 4% yearly risk of stroke given her CHADS VASC score of 2.  Past Medical History:  Diagnosis Date  . 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 Arthritis    "hands, fingers, back" (02/27/2014)  . Atrial fibrillation ([place])   . Cat scratch of right hand 02/24/2014  . High cholesterol   . History of stomach ulcers ~ 1966  . Osteopenia     Past Surgical History:  Procedure Laterality Date  . ANUS SURGERY  2005   "biopsy"  . BREAST CYST EXCISION  1966  . DILATION AND CURETTAGE OF UTERUS  1980's   S/P miscarriage  . VARICOSE VEIN SURGERY Left    left leg    Current Medications: Outpatient Medications Prior to Visit  Medication Sig Dispense Refill  . ibuprofen (ADVIL,MOTRIN) 600 MG tablet Take 600 mg by mouth  every 8 (eight) hours as needed for headache or mild pain.     Marland Kitchen LORazepam (ATIVAN) 1 MG tablet TAKE ONE-HALF (1/2) TO ONE (1) TABLET BYMOUTH ONCE A DAY AS NEEDED FOR ANXIETY 30 tablet 0  . conjugated estrogens (PREMARIN) vaginal cream Place 1 Applicatorful vaginally daily. (Patient not taking: Reported on 12/21/2016) 30 g 1  . ibuprofen (ADVIL,MOTRIN) 600 MG tablet Take 1 tablet (600 mg total) by mouth every 8 (eight) hours as needed. (Patient not taking: Reported on 12/21/2016) 60 tablet 1   No facility-administered medications prior to visit.      Allergies:   Doxycycline; Erythromycin; Flonase [fluticasone propionate]; Ivp dye [iodinated diagnostic agents]; Keflex [cephalexin]; Paxil [paroxetine hcl]; Premarin [conjugated estrogens]; Provera [medroxyprogesterone acetate]; Zithromax [azithromycin]; and Zoloft [sertraline hcl]   Social History   Social History  . Marital status: Married    Spouse name: N/A  . Number of children: N/A  . Years of education: N/A   Social History Main Topics  . Smoking status: Former Smoker    Packs/day: 1.00    Years: 30.00    Types: Cigarettes  . Smokeless tobacco: Never Used  . Alcohol use No  . Drug use: No  . Sexual activity: Yes    Partners: Male    Birth control/ protection: Post-menopausal   Other Topics Concern  . None   Social History Narrative  . None     Family History:  The patient's  family history includes Breast cancer in her maternal aunt and maternal grandmother; Heart disease in her mother; Rectal cancer in her paternal grandmother; Stroke in her father.   ROS:   Please see the history of present illness.    Anxiety, headaches, depression, vision disturbance.  All other systems reviewed and are negative.   PHYSICAL EXAM:   VS:  BP 132/70 (BP Location: Right Arm)   Pulse 82   Ht 5\' 7"  (1.702 m)   Wt 131 lb 6.4 oz (59.6 kg)   LMP 11/14/1992   BMI 20.58 kg/m    GEN: Well nourished, well developed, in no acute distress    HEENT: normal  Neck: no JVD, carotid bruits, or masses Cardiac: RRR; no murmurs, rubs, or gallops,no edema  Respiratory:  clear to auscultation bilaterally, normal work of breathing GI: soft, nontender, nondistended, + BS MS: no deformity or atrophy  Skin: warm and dry, no rash Neuro:  Alert and Oriented x 3, Strength and sensation are intact Psych: euthymic mood, full affect  Wt Readings from Last 3 Encounters:  12/21/16 131 lb 6.4 oz (59.6 kg)  12/15/16 132 lb 3.2 oz (60 kg)  11/25/16 132 lb (59.9 kg)      Studies/Labs Reviewed:   EKG:  EKG  Nonspecific ST depression diffusely on the EKG done recently at the atrial fibrillation clinic.  Recent Labs: 10/24/2016: TSH 7.475 10/25/2016: BUN 10; Creatinine, Ser 0.93; Hemoglobin 14.0; Platelets 311; Potassium 3.9; Sodium 138   Lipid Panel No results found for: CHOL, TRIG, HDL, CHOLHDL, VLDL, LDLCALC, LDLDIRECT  Additional studies/ records that were reviewed today include:  Echocardiogram done 11/17/2016: Study Conclusions  - Left ventricle: The cavity size was normal. Systolic function was   vigorous. The estimated ejection fraction was in the range of 65%   to 70%. Wall motion was normal; there were no regional wall   motion abnormalities. Features are consistent with a pseudonormal   left ventricular filling pattern, with concomitant abnormal   relaxation and increased filling pressure (grade 2 diastolic   dysfunction). Doppler parameters are consistent with   indeterminate ventricular filling pressure. - Aortic valve: Transvalvular velocity was within the normal range.   There was no stenosis. There was no regurgitation. - Mitral valve: Transvalvular velocity was within the normal range.   There was no evidence for stenosis. There was trivial   regurgitation. - Right ventricle: The cavity size was normal. Wall thickness was   normal. Systolic function was normal. - Tricuspid valve: There was trivial regurgitation. -  Pulmonary arteries: Systolic pressure was within the normal   range. PA peak pressure: 27 mm Hg (S).   ASSESSMENT:    1. Paroxysmal atrial fibrillation (HCC)   2. Rectal cancer ([place])      PLAN:  In order of problems listed above:  1. One episode of atrial fibrillation. Structurally normal heart on echo. Relatively low CHADS VASC score of 2 based on female sex and age. She refuses anticoagulation therapy. We will perform a thirty-day continuous monitor to determine if she is having asymptomatic episodes. If there is a high burden of asymptomatic atrial fibrillation we will rediscuss anticoagulation. For the time being I have advised her to take aspirin 81 mg per day.  Local follow-up in 4-6 weeks after completion of 30 day monitor.    Medication Adjustments/Labs and Tests Ordered: Current medicines are reviewed at length with the patient today.  Concerns regarding medicines are outlined above.  Medication changes, Labs and Tests ordered  today are listed in the Patient Instructions below. There are no Patient Instructions on file for this visit.   Signed, Julie Grooms, MD  12/21/2016 9:41 AM    [facility] [place], [place],   [address] Phone: [phone]; Fax: [phone]

## 2016-12-21 NOTE — Patient Instructions (Addendum)
Medication Instructions:  1) START Aspirin 81mg  once daily  Labwork: None  Testing/Procedures: Your physician has recommended that you wear an event monitor. Event monitors are medical devices that record the heart's electrical activity. Doctors most often [place] these monitors to diagnose arrhythmias. Arrhythmias are problems with the speed or rhythm of the heartbeat. The monitor is a small, portable device. You can wear one while you do your normal daily activities. This is usually used to diagnose what is causing palpitations/syncope (passing out).   Follow-Up: Your physician recommends that you schedule a follow-up appointment in: 6-8 weeks with Dr. Tamala Julian. (Can have 3/26 at 10:45A or 3/27 at 10A)   Any Other Special Instructions Will Be Listed Below (If Applicable).     If you need a refill on your cardiac medications before your next appointment, please call your pharmacy.

## 2016-12-22 ENCOUNTER — Ambulatory Visit (INDEPENDENT_AMBULATORY_CARE_PROVIDER_SITE_OTHER): Payer: Medicare Other

## 2016-12-22 DIAGNOSIS — I480 Paroxysmal atrial fibrillation: Secondary | ICD-10-CM | POA: Diagnosis not present

## 2017-01-16 ENCOUNTER — Telehealth: Payer: Self-pay | Admitting: Obstetrics & Gynecology

## 2017-01-16 NOTE — Telephone Encounter (Signed)
Patient called and requested a referral to a neurologist for worsening continued headaches.  She said she has spoken with Dr. Sabra Heck about this before.

## 2017-01-18 ENCOUNTER — Other Ambulatory Visit: Payer: Self-pay | Admitting: Obstetrics & Gynecology

## 2017-01-18 DIAGNOSIS — R51 Headache: Principal | ICD-10-CM

## 2017-01-18 DIAGNOSIS — R519 Headache, unspecified: Secondary | ICD-10-CM

## 2017-01-18 NOTE — Telephone Encounter (Signed)
Referral has been placed to Dr. Carles Collet.  Please let pt know if she has not heard about referral by end of the week, to please call us back.  Thanks.

## 2017-01-18 NOTE — Telephone Encounter (Signed)
I called and spoke with Julie Mann this morning.  She is aware to call our office by the end of the week if she does not hear from Dr. Doristine Devoid office.  Patient wants Dr. Sabra Heck to know she has also scheduled an appointment with Dr. Ike Bene at the [facility] next week. 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  She said she probably will see both doctors to get to the bottom of the headache she's "had for three months."

## 2017-01-26 ENCOUNTER — Encounter: Payer: Self-pay | Admitting: Interventional Cardiology

## 2017-01-27 ENCOUNTER — Other Ambulatory Visit: Payer: Self-pay | Admitting: Rehabilitation

## 2017-01-27 DIAGNOSIS — M5412 Radiculopathy, cervical region: Secondary | ICD-10-CM

## 2017-02-01 ENCOUNTER — Other Ambulatory Visit: Payer: Self-pay | Admitting: Obstetrics & Gynecology

## 2017-02-01 NOTE — Telephone Encounter (Signed)
Medication refill request: LORazepam 1mg  Last AEX:  11/25/16 SM Next AEX: 02/23/18 Last MMG (if hormonal medication request): 02/18/16 BIRADS 1 negative Refill authorized: 12/15/16 #30 w/0 refills; today please advise

## 2017-02-01 NOTE — Telephone Encounter (Signed)
Refill request to Dr. Sabra Heck for review.  Cc- Dr. Sabra Heck

## 2017-02-05 NOTE — Progress Notes (Signed)
Cardiology Office Note    Date:  02/06/2017   ID:  Julie, Mann October 13, 1946, MRN [id number]  PCP:  Mathews Argyle, MD  Cardiologist: Sinclair Grooms, MD   Chief Complaint  Patient presents with  . Atrial Fibrillation    Follow-up clinical evaluation.    History of Present Illness:  Julie Mann is a 71 y.o. female paroxysmal atrial fibrillation that required diltiazem and flecainide for conversion during an emergency room visit in late 2017. CHADS VASC score is 2 .  This patients CHA2DS2-VASc Score and unadjusted Ischemic Stroke Rate (% per year) is equal to 2.2 % stroke rate/year from a score of 2  Anticoagulation was recommended both at the atrial fibrillation clinic and also by those caring for her during the emergency room visit in December when she presented with atrial fib, dyspnea, palpitations, and dizziness. I saw her in February. Echo demonstrated a structurally normal heart without evidence of left ventricular hypertrophy but signs of diastolic dysfunction. Thirty-day continuous monitor demonstrated greater than 4 hours of atrial fibrillation. No specific symptoms were noted.  She has multiple questions and concerns. She is concerned about bleeding risk on anticoagulation. She still enjoys horseback riding. She worries about the possibility of unforeseen head trauma if she were to lose her balance or be thrown. She is currently wearing the bleeding risk against the anticipated benefit of stroke prevention with an estimated yearly risk of 2.2% versus less than 1%.  Past Medical History:  Diagnosis Date  . 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 Arthritis    "hands, fingers, back" (02/27/2014)  . Atrial fibrillation ([place])   . Cat scratch of right hand 02/24/2014  . High cholesterol   . History of stomach ulcers ~ 1966  . Osteopenia     Past Surgical History:  Procedure Laterality Date  . ANUS SURGERY  2005   "biopsy"  . BREAST  CYST EXCISION  1966  . DILATION AND CURETTAGE OF UTERUS  1980's   S/P miscarriage  . VARICOSE VEIN SURGERY Left    left leg    Current Medications: Outpatient Medications Prior to Visit  Medication Sig Dispense Refill  . aspirin EC 81 MG tablet Take 1 tablet (81 mg total) by mouth daily. 90 tablet 3  . Estrogens, Conjugated (PREMARIN VA) Place 1 application vaginally daily as needed (irritation).    Marland Kitchen ibuprofen (ADVIL,MOTRIN) 600 MG tablet Take 600 mg by mouth every 8 (eight) hours as needed for headache or mild pain.     Marland Kitchen LORazepam (ATIVAN) 1 MG tablet TAKE 1/2 TO 1 TABLET BY MOUTH ONCE A DAYAS NEEDED FOR ANXIETY 30 tablet 0  . Multiple Vitamins-Minerals (MULTIVITAMIN PO) Take 1 tablet by mouth daily.     No facility-administered medications prior to visit.      Allergies:   Doxycycline; Erythromycin; Flonase [fluticasone propionate]; Ivp dye [iodinated diagnostic agents]; Keflex [cephalexin]; Paxil [paroxetine hcl]; Premarin [conjugated estrogens]; Provera [medroxyprogesterone acetate]; Zithromax [azithromycin]; and Zoloft [sertraline hcl]   Social History   Social History  . Marital status: Married    Spouse name: N/A  . Number of children: N/A  . Years of education: N/A   Social History Main Topics  . Smoking status: Former Smoker    Packs/day: 1.00    Years: 30.00    Types: Cigarettes  . Smokeless tobacco: Never Used  . Alcohol use No  . Drug use: No  . Sexual activity: Yes  Partners: Male    Birth control/ protection: Post-menopausal   Other Topics Concern  . None   Social History Narrative  . None     Family History:  The patient's family history includes Breast cancer in her maternal aunt and maternal grandmother; Heart disease in her mother; Rectal cancer in her paternal grandmother; Stroke in her father.   ROS:   Please see the history of present illness.    Multiple medication intolerances. Occasional small amounts of blood in her stool related to  prior diagnosis of rectal cancer. She has never had frank GI bleeding that required transfusion. No history of hypertension, CVA, or known vascular disease. All other systems reviewed and are negative.   PHYSICAL EXAM:   VS:  BP (!) 146/72 (BP Location: Left Arm)   Pulse 72   Ht 5\' 7"  (1.702 m)   Wt 133 lb 9.6 oz (60.6 kg)   LMP 11/14/1992   BMI 20.92 kg/m    GEN: Well nourished, well developed, in no acute distress  HEENT: normal  Neck: no JVD, carotid bruits, or masses Cardiac: RRR; no murmurs, rubs, or gallops,no edema  Respiratory:  clear to auscultation bilaterally, normal work of breathing GI: soft, nontender, nondistended, + BS MS: no deformity or atrophy  Skin: warm and dry, no rash Neuro:  Alert and Oriented x 3, Strength and sensation are intact Psych: euthymic mood, full affect  Wt Readings from Last 3 Encounters:  02/06/17 133 lb 9.6 oz (60.6 kg)  12/21/16 131 lb 6.4 oz (59.6 kg)  12/15/16 132 lb 3.2 oz (60 kg)      Studies/Labs Reviewed:   EKG:  EKG  Not repeated  Recent Labs: 10/24/2016: TSH 7.475 10/25/2016: BUN 10; Creatinine, Ser 0.93; Hemoglobin 14.0; Platelets 311; Potassium 3.9; Sodium 138   Lipid Panel No results found for: CHOL, TRIG, HDL, CHOLHDL, VLDL, LDLCALC, LDLDIRECT  Additional studies/ records that were reviewed today include:  Thirty-day continuous monitor: Completed 12/22/16  Basic underlying rhythm is NSR  Occasional AF with RVR is noted.   NSR and one episode of AF lasting > 4 hours with some RVR as well as controlled rate.  Echocardiogram 11/17/2016: Study Conclusions  - Left ventricle: The cavity size was normal. Systolic function was   vigorous. The estimated ejection fraction was in the range of 65%   to 70%. Wall motion was normal; there were no regional wall   motion abnormalities. Features are consistent with a pseudonormal   left ventricular filling pattern, with concomitant abnormal   relaxation and increased filling  pressure (grade 2 diastolic   dysfunction). Doppler parameters are consistent with   indeterminate ventricular filling pressure. - Aortic valve: Transvalvular velocity was within the normal range.   There was no stenosis. There was no regurgitation. - Mitral valve: Transvalvular velocity was within the normal range.   There was no evidence for stenosis. There was trivial   regurgitation. - Right ventricle: The cavity size was normal. Wall thickness was   normal. Systolic function was normal. - Tricuspid valve: There was trivial regurgitation. - Pulmonary arteries: Systolic pressure was within the normal   range. PA peak pressure: 27 mm Hg (S).   ASSESSMENT:    1. Paroxysmal atrial fibrillation (HCC)   2. Pure hypercholesterolemia   3. Rectal cancer ([place])      PLAN:  In order of problems listed above:  1. Paroxysmal atrial fibrillation now documented more than once. She had the emergency room visit in December when  flecainide and diltiazem were used to convert the patient to normal sinus rhythm. Additionally, the 30 day monitor has demonstrated up to 4 hours of atrial fibrillation. Minimal symptoms were recognized by the patient. The CHADS VASC is 2. She has Eliquis but has decided for the time being not to use the medication. She will let us know if she has a change of heart. 2. Not addressed 3. Prior history of rectal cancer with successful surgery but occasional blood-tinged bowel movements. She is concerned that anticoagulation will cause excessive bleeding. She will let us know if she decides to start  We had a long discussion concerning the pros and cons of anticoagulation. A 20% ten year risk of stroke if no anticoagulation.  Today's visit was prolonged with greater than 50% of the time spent in counseling. She has decided to hold on using anticoagulation for the time being. We will plan to see her back in 6 months.    Medication Adjustments/Labs and Tests Ordered: Current  medicines are reviewed at length with the patient today.  Concerns regarding medicines are outlined above.  Medication changes, Labs and Tests ordered today are listed in the Patient Instructions below. Patient Instructions  Medication Instructions:  Your physician has recommended you make the following change in your medication Start taking Eliquis 5 mg twice. Please call [phone] to inform Dr. Tamala Julian when started.  Labwork: None ordered  Testing/Procedures: None ordered  Follow-Up: Your physician wants you to follow-up in 6 months with Dr. Tamala Julian.You will receive a reminder letter in the mail two months in advance. If you don't receive a letter, please call our office to schedule the follow-up appointment.   Any Other Special Instructions Will Be Listed Below (If Applicable).     If you need a refill on your cardiac medications before your next appointment, please call your pharmacy.      Signed, Sinclair Grooms, MD  02/06/2017 1:10 PM    [facility] [place], [place], [place]  [address] Phone: [phone]; Fax: [phone]

## 2017-02-06 ENCOUNTER — Encounter: Payer: Self-pay | Admitting: Interventional Cardiology

## 2017-02-06 ENCOUNTER — Ambulatory Visit (INDEPENDENT_AMBULATORY_CARE_PROVIDER_SITE_OTHER): Payer: Medicare Other | Admitting: Interventional Cardiology

## 2017-02-06 ENCOUNTER — Encounter (INDEPENDENT_AMBULATORY_CARE_PROVIDER_SITE_OTHER): Payer: Self-pay

## 2017-02-06 VITALS — BP 146/72 | HR 72 | Ht 67.0 in | Wt 133.6 lb

## 2017-02-06 DIAGNOSIS — E7800 Pure hypercholesterolemia, unspecified: Secondary | ICD-10-CM | POA: Diagnosis not present

## 2017-02-06 DIAGNOSIS — I480 Paroxysmal atrial fibrillation: Secondary | ICD-10-CM | POA: Diagnosis not present

## 2017-02-06 DIAGNOSIS — C20 Malignant neoplasm of rectum: Secondary | ICD-10-CM | POA: Diagnosis not present

## 2017-02-06 NOTE — Telephone Encounter (Signed)
Prescription faxed to pharmacy on file today.

## 2017-02-06 NOTE — Patient Instructions (Signed)
Medication Instructions:  Your physician has recommended you make the following change in your medication Start taking Eliquis 5 mg twice. Please call [phone] to inform Dr. Tamala Julian when started.  Labwork: None ordered  Testing/Procedures: None ordered  Follow-Up: Your physician wants you to follow-up in 6 months with Dr. Tamala Julian.You will receive a reminder letter in the mail two months in advance. If you don't receive a letter, please call our office to schedule the follow-up appointment.   Any Other Special Instructions Will Be Listed Below (If Applicable).     If you need a refill on your cardiac medications before your next appointment, please call your pharmacy.

## 2017-02-09 ENCOUNTER — Ambulatory Visit
Admission: RE | Admit: 2017-02-09 | Discharge: 2017-02-09 | Disposition: A | Discharge status: Home / Self Care | Payer: Medicare Other | Source: Ambulatory Visit | Attending: Rehabilitation | Admitting: Rehabilitation

## 2017-02-09 DIAGNOSIS — M5412 Radiculopathy, cervical region: Secondary | ICD-10-CM

## 2017-02-20 ENCOUNTER — Telehealth: Payer: Self-pay | Admitting: Interventional Cardiology

## 2017-02-20 MED ORDER — APIXABAN 5 MG PO TABS
5.0000 mg | ORAL_TABLET | Freq: Two times a day (BID) | ORAL | 3 refills | Status: DC
Start: 2017-02-20 — End: 2017-02-21

## 2017-02-20 NOTE — Telephone Encounter (Signed)
Made Dr. Tamala Julian aware.  Spoke with pt and reminded her to stop ASA. Pt verbalized understanding and was in agreement with this plan.

## 2017-02-20 NOTE — Telephone Encounter (Signed)
Julie Mann is calling to let Dr. Tamala Julian know that she has started the medication Eliquis on today. Please call if you have any questions . Thanks

## 2017-02-21 ENCOUNTER — Telehealth: Payer: Self-pay | Admitting: Interventional Cardiology

## 2017-02-21 MED ORDER — RIVAROXABAN 20 MG PO TABS: 20.0000 mg | ORAL_TABLET | Freq: Every day | ORAL | 3 refills | Status: DC

## 2017-02-21 NOTE — Addendum Note (Signed)
Addended by: Loren Racer on: 02/21/2017 02:31 PM   Modules accepted: Orders

## 2017-02-21 NOTE — Telephone Encounter (Signed)
Pt took Eliquis yesterday at 9:30A and by 12pm pt's stomach was hurting, fatigued, dizzy, HA and chest tightness.  Pt drank lots of water trying to flush her system.  States sx worsened throughout the day and finally around 5pm started to feel a little better and went to work last night.  Pt woke up this morning feeling bad today, feels foggy and weak. Had pt check HR and it was 80 and regular as far as she can tell.  Pt states HR yesterday during sx was aound 80 as well.  Pt refuses to take Eliquis anymore.  Advised I would send message to Dr. Tamala Julian for review and advisement.

## 2017-02-21 NOTE — Telephone Encounter (Signed)
With an abundance of respect, it is difficult to determine if this is a medication effect or anxiety. Find out if she is comfortable with trying other agents. If so, switch to Xarelto 20 mg per day versus coming to Coumadin clinic and starting warfarin.  If unwilling to be treated, resume aspirin daily.

## 2017-02-21 NOTE — Telephone Encounter (Signed)
New Message    Pt c/o medication issue:  1. Name of Medication:  apixaban (ELIQUIS) 5 MG TABS tablet 2. How are you currently taking this medication (dosage and times per day)? Took one yesterday  3. Are you having a reaction (difficulty breathing--STAT)? Started off sleepy, then she got dizzy, and then chest got tight  4. What is your medication issue? Per pt had reaction to medication. Per pt still feels bad, and felt like she was having a afib attack

## 2017-02-21 NOTE — Telephone Encounter (Signed)
Spoke with pt and went over recommendations per Dr. Tamala Julian.  Pt is agreeable to try Xarelto.  Advised I would place samples at the front desk for pt to pick up.  Pt appreciative for assistance.

## 2017-02-22 ENCOUNTER — Telehealth: Payer: Self-pay | Admitting: Interventional Cardiology

## 2017-02-22 NOTE — Telephone Encounter (Signed)
New Message  Pt c/o medication issue:  1. Name of Medication: Xarelto 20 mg tab once with daily supper  2. How are you currently taking this medication (dosage and times per day)? See above  3. Are you having a reaction (difficulty breathing--STAT)? N/A  4. What is your medication issue? 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

## 2017-02-22 NOTE — Telephone Encounter (Signed)
Spoke with pt and advised her to take medication daily around the same time with food.  Pt verbalized understanding and was in agreement with this plan.

## 2017-02-24 ENCOUNTER — Telehealth: Payer: Self-pay | Admitting: Interventional Cardiology

## 2017-02-24 NOTE — Telephone Encounter (Signed)
Pt started Xarelto yesterday and had slight nausea, HA, fatigue and slight chest tightness.  States sx were not nearly as bad as when she took the Eliquis.  Pt states she had an "Afib attack" Wednesday night that lasted about 2 hrs.  Advised pt fatigue could have came from this vs the medication.  Pt states sx were not bad enough to stop her from trying Xarelto another day.  Advised her if sx occur again and she does not wish to continue Xarelto to switch back to ASA and call me on Monday and let me know.  Pt will think about Coumadin over weekend in the event she can not tolerate the Xarelto.  Pt verbalized understanding and was appreciative for call.

## 2017-02-24 NOTE — Telephone Encounter (Signed)
New Message:    Please call,concerning her Atrial Fib medicine.

## 2017-02-27 ENCOUNTER — Telehealth: Payer: Self-pay | Admitting: Interventional Cardiology

## 2017-02-27 NOTE — Telephone Encounter (Signed)
Patient calling, states that she would like to discuss her medications again. Thanks.

## 2017-02-27 NOTE — Telephone Encounter (Signed)
Pt unable to tolerate Xarelto.  Caused chest discomfort, not as bad as Eliquis.  Pt states both medications causing stomach issues.  Pt states she becomes bloated and then has pain right under her breasts.  She feels like her stomach issues are preventing her from being able to handle these medications.  She wants to speak with her GI doctor about something she can take to help her stomach and then possibly retry the anticoags.  Pt does not wish to try Pradaxa or Warfarin at this time.  Advised pt to continue ASA.  Pt also inquired about possibly using a pillin a pocket?  She has been reading online and this was mentioned often.  Pt also wanted to know about trying the lower dose of Eliquis if she is able to try the medication again after seeing GI?  Will route to Dr. Tamala Julian for review and advisement.

## 2017-02-27 NOTE — Telephone Encounter (Signed)
Ask her to stick with aspirin. Lower dose therapy has not been proven to have any efficacy.

## 2017-02-27 NOTE — Telephone Encounter (Signed)
Spoke with pt and advised her of recommendations per Dr. Tamala Julian.  Pt verbalized understanding and was in agreement with this plan.

## 2017-03-13 ENCOUNTER — Other Ambulatory Visit: Payer: Self-pay | Admitting: Obstetrics & Gynecology

## 2017-03-13 DIAGNOSIS — Z1231 Encounter for screening mammogram for malignant neoplasm of breast: Secondary | ICD-10-CM

## 2017-03-21 ENCOUNTER — Ambulatory Visit: Payer: Medicare Other | Admitting: Neurology

## 2017-03-31 ENCOUNTER — Ambulatory Visit
Admission: RE | Admit: 2017-03-31 | Discharge: 2017-03-31 | Disposition: A | Discharge status: Home / Self Care | Payer: Medicare Other | Source: Ambulatory Visit | Attending: Obstetrics & Gynecology | Admitting: Obstetrics & Gynecology

## 2017-03-31 DIAGNOSIS — Z1231 Encounter for screening mammogram for malignant neoplasm of breast: Secondary | ICD-10-CM

## 2017-04-13 ENCOUNTER — Other Ambulatory Visit: Payer: Self-pay | Admitting: Obstetrics & Gynecology

## 2017-04-13 NOTE — Telephone Encounter (Signed)
Medication refill request: Ativan  Last AEX:  11-25-16  Next AEX: 02-23-18  Last MMG (if hormonal medication request): 03-31-17 WNL  Refill authorized: please advise

## 2017-04-14 NOTE — Telephone Encounter (Signed)
Prescription faxed to [facility] on file.

## 2017-08-01 ENCOUNTER — Other Ambulatory Visit: Payer: Self-pay | Admitting: Obstetrics & Gynecology

## 2017-08-01 NOTE — Telephone Encounter (Signed)
Medication refill request: lorazepam  Last AEX:  11-25-16  Next AEX: 02-23-18  Last MMG (if hormonal medication request): 04-03-17 WNL  Refill authorized: please advise

## 2017-08-03 ENCOUNTER — Encounter (INDEPENDENT_AMBULATORY_CARE_PROVIDER_SITE_OTHER): Payer: Self-pay

## 2017-08-03 ENCOUNTER — Ambulatory Visit (INDEPENDENT_AMBULATORY_CARE_PROVIDER_SITE_OTHER): Payer: Medicare Other | Admitting: Interventional Cardiology

## 2017-08-03 VITALS — BP 134/68 | HR 73 | Ht 67.0 in | Wt 129.0 lb

## 2017-08-03 DIAGNOSIS — F419 Anxiety disorder, unspecified: Secondary | ICD-10-CM

## 2017-08-03 DIAGNOSIS — E7800 Pure hypercholesterolemia, unspecified: Secondary | ICD-10-CM

## 2017-08-03 DIAGNOSIS — C20 Malignant neoplasm of rectum: Secondary | ICD-10-CM | POA: Diagnosis not present

## 2017-08-03 DIAGNOSIS — I480 Paroxysmal atrial fibrillation: Secondary | ICD-10-CM

## 2017-08-03 MED ORDER — ASPIRIN EC 81 MG PO TBEC: 81.0000 mg | DELAYED_RELEASE_TABLET | Freq: Every day | ORAL | 3 refills | Status: DC

## 2017-08-03 NOTE — Patient Instructions (Addendum)
Medication Instructions:  1) Make sure you are taking Aspirin 81mg  once daily  Labwork: None  Testing/Procedures: None  Follow-Up: Your physician wants you to follow-up in: 1 year with Dr. Tamala Julian.  You will receive a reminder letter in the mail two months in advance. If you don't receive a letter, please call our office to schedule the follow-up appointment.   Any Other Special Instructions Will Be Listed Below (If Applicable).  Please report to ER if you have atrial fibrillation, chest pain, shortness of breath or dizziness.     If you need a refill on your cardiac medications before your next appointment, please call your pharmacy.

## 2017-08-03 NOTE — Progress Notes (Signed)
Cardiology Office Note    Date:  08/03/2017   ID:  Julie, Mann 1946-03-29, MRN [id number]  PCP:  Lajean Manes, MD  Cardiologist: Sinclair Grooms, MD   Chief Complaint  Patient presents with  . Atrial Fibrillation    History of Present Illness:  Julie Mann is a 71 y.o. female paroxysmal atrial fibrillation that required diltiazem and flecainide for conversion during an emergency room visit in late 2017. CHADS VASC score is 2 .  We have attempted to initiate chronic anticoagulation therapy but medication intolerance, mostly gastrointestinal, has caused her to discontinue Xarelto and Eliquis. She has documented 3 episodes of prolonged atrial fibrillation since last being seen. These occurred in April, August, in September. She has otherwise done well. She denies neurological complaints. She has resumed taking aspirin 81 mg per day. She is not interested in trying additional anti-coagulant therapy. She doesn't also lot of electronic research and is instituting her on Methasone for rhythm control including taking magnesium. We she has atrial fibrillation she lies on the floor and elevates her legs.   Past Medical History:  Diagnosis Date  . 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 Arthritis    "hands, fingers, back" (02/27/2014)  . Atrial fibrillation ([place])   . Cat scratch of right hand 02/24/2014  . High cholesterol   . History of stomach ulcers ~ 1966  . Osteopenia     Past Surgical History:  Procedure Laterality Date  . ANUS SURGERY  2005   "biopsy"  . BREAST CYST EXCISION  1966  . DILATION AND CURETTAGE OF UTERUS  1980's   S/P miscarriage  . VARICOSE VEIN SURGERY Left    left leg    Current Medications: Outpatient Medications Prior to Visit  Medication Sig Dispense Refill  . Estrogens, Conjugated (PREMARIN VA) Place 1 application vaginally daily as needed (irritation).    Marland Kitchen ibuprofen (ADVIL,MOTRIN) 600 MG tablet Take 600 mg by  mouth every 8 (eight) hours as needed for headache or mild pain.     Marland Kitchen LORazepam (ATIVAN) 1 MG tablet TAKE 1/2 TO 1 TABLET BY MOUTH ONCE A DAYAS NEEDED FOR ANXIETY 30 tablet 1  . Multiple Vitamins-Minerals (MULTIVITAMIN PO) Take 1 tablet by mouth daily.    . rivaroxaban (XARELTO) 20 MG TABS tablet Take 1 tablet (20 mg total) by mouth daily with supper. (Patient not taking: Reported on 08/03/2017) 90 tablet 3   No facility-administered medications prior to visit.      Allergies:   Doxycycline; Erythromycin; Flonase [fluticasone propionate]; Ivp dye [iodinated diagnostic agents]; Keflex [cephalexin]; Paxil [paroxetine hcl]; Premarin [conjugated estrogens]; Provera [medroxyprogesterone acetate]; Zithromax [azithromycin]; and Zoloft [sertraline hcl]   Social History   Social History  . Marital status: Married    Spouse name: N/A  . Number of children: N/A  . Years of education: N/A   Social History Main Topics  . Smoking status: Former Smoker    Packs/day: 1.00    Years: 30.00    Types: Cigarettes  . Smokeless tobacco: Never Used  . Alcohol use No  . Drug use: No  . Sexual activity: Yes    Partners: Male    Birth control/ protection: Post-menopausal   Other Topics Concern  . Not on file   Social History Narrative  . No narrative on file     Family History:  The patient's family history includes Breast cancer in her maternal aunt and maternal grandmother; Breast  cancer (age of onset: 49) in her sister; Breast cancer (age of onset: 43) in her sister; Heart disease in her mother; Rectal cancer in her paternal grandmother; Stroke in her father.   ROS:   Please see the history of present illness.    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  All other systems reviewed and are negative.   PHYSICAL EXAM:   VS:  BP 134/68   Pulse 73   Ht 5\' 7"  (1.702 m)   Wt 129 lb (58.5 kg)    LMP 11/14/1992   BMI 20.20 kg/m    GEN: Well nourished, well developed, in no acute distress  HEENT: normal  Neck: no JVD, carotid bruits, or masses Cardiac: RRR; no murmurs, rubs, or gallops,no edema  Respiratory:  clear to auscultation bilaterally, normal work of breathing GI: soft, nontender, nondistended, + BS MS: no deformity or atrophy  Skin: warm and dry, no rash Neuro:  Alert and Oriented x 3, Strength and sensation are intact Psych: euthymic mood, full affect  Wt Readings from Last 3 Encounters:  08/03/17 129 lb (58.5 kg)  02/06/17 133 lb 9.6 oz (60.6 kg)  12/21/16 131 lb 6.4 oz (59.6 kg)      Studies/Labs Reviewed:   EKG:  EKG  Not repeated  Recent Labs: 10/24/2016: TSH 7.475 10/25/2016: BUN 10; Creatinine, Ser 0.93; Hemoglobin 14.0; Platelets 311; Potassium 3.9; Sodium 138   Lipid Panel No results found for: CHOL, TRIG, HDL, CHOLHDL, VLDL, LDLCALC, LDLDIRECT  Additional studies/ records that were reviewed today include:  No new data    ASSESSMENT:    1. Paroxysmal atrial fibrillation (HCC)   2. Pure hypercholesterolemia   3. Anxiety   4. Rectal cancer ([place])      PLAN:  In order of problems listed above:  1. Continues to have at times prolonged episodes of atrial fibrillation that she is aware of. Not sure how much could be going on that the patient is not aware of. Her stroke risk is greater than enough to require long-term anticoagulation but she is unable to tolerate medications and frankly does not want to be on anticoagulant therapy. 2. Not discussed. 3. I encouraged her to be careful with accelerating benzodiazepine therapy.  Encouraged aspirin 81 mg per day. Clinical follow-up in one year. Prolonged episodes of palpitation that are associated with shortness of breath, dizziness, or chest pain should prompt medical attention.  Medication Adjustments/Labs and Tests Ordered: Current medicines are reviewed at length with the patient today.   Concerns regarding medicines are outlined above.  Medication changes, Labs and Tests ordered today are listed in the Patient Instructions below. Patient Instructions  Medication Instructions:  1) Make sure you are taking Aspirin 81mg  once daily  Labwork: None  Testing/Procedures: None  Follow-Up: Your physician wants you to follow-up in: 1 year with Dr. Tamala Julian.  You will receive a reminder letter in the mail two months in advance. If you don't receive a letter, please call our office to schedule the follow-up appointment.   Any Other Special Instructions Will Be Listed Below (If Applicable).  Please report to ER if you have atrial fibrillation, chest pain, shortness of breath or dizziness.     If you need a refill on your cardiac medications before your next appointment, please call your pharmacy.      Signed, Sinclair Grooms, MD  08/03/2017 9:10 AM    [facility]  [address], [place], [place]  [address] Phone: [phone]; Fax: [phone]

## 2017-08-07 NOTE — Telephone Encounter (Signed)
Patient checking the status of refill request.

## 2017-08-07 NOTE — Telephone Encounter (Signed)
This has been done. Thanks.

## 2017-10-10 ENCOUNTER — Other Ambulatory Visit: Payer: Self-pay | Admitting: Obstetrics & Gynecology

## 2017-10-10 NOTE — Telephone Encounter (Signed)
Medication refill request: ativan  Last AEX:  11/25/16 SM Next AEX: 02/23/18 SM Last MMG (if hormonal medication request): 04/03/17 [id number]  Refill authorized: 08/07/17 [email or web address]. Today please advise.

## 2017-12-06 ENCOUNTER — Other Ambulatory Visit: Payer: Self-pay | Admitting: Obstetrics & Gynecology

## 2017-12-06 NOTE — Telephone Encounter (Signed)
Medication refill request: Ativan  Last AEX:  11-25-16  Next AEX: 02-23-18  Last MMG (if hormonal medication request): 04-03-17 WNL Refill authorized: please advise

## 2018-01-12 DIAGNOSIS — W19XXXA Unspecified fall, initial encounter: Secondary | ICD-10-CM

## 2018-01-12 HISTORY — DX: Unspecified fall, initial encounter: W19.XXXA

## 2018-02-05 ENCOUNTER — Emergency Department (HOSPITAL_COMMUNITY): Payer: Medicare Other

## 2018-02-05 ENCOUNTER — Emergency Department (HOSPITAL_COMMUNITY)
Admission: EM | Admit: 2018-02-05 | Discharge: 2018-02-05 | Disposition: A | Discharge status: Home / Self Care | Payer: Medicare Other | Attending: Emergency Medicine | Admitting: Emergency Medicine

## 2018-02-05 ENCOUNTER — Encounter (HOSPITAL_COMMUNITY): Payer: Self-pay | Admitting: Emergency Medicine

## 2018-02-05 ENCOUNTER — Other Ambulatory Visit: Payer: Self-pay

## 2018-02-05 DIAGNOSIS — W19XXXA Unspecified fall, initial encounter: Secondary | ICD-10-CM

## 2018-02-05 DIAGNOSIS — R1010 Upper abdominal pain, unspecified: Secondary | ICD-10-CM | POA: Insufficient documentation

## 2018-02-05 DIAGNOSIS — I480 Paroxysmal atrial fibrillation: Secondary | ICD-10-CM

## 2018-02-05 DIAGNOSIS — Y939 Activity, unspecified: Secondary | ICD-10-CM | POA: Diagnosis not present

## 2018-02-05 DIAGNOSIS — Y929 Unspecified place or not applicable: Secondary | ICD-10-CM | POA: Insufficient documentation

## 2018-02-05 DIAGNOSIS — R0789 Other chest pain: Secondary | ICD-10-CM | POA: Insufficient documentation

## 2018-02-05 DIAGNOSIS — Z87891 Personal history of nicotine dependence: Secondary | ICD-10-CM | POA: Insufficient documentation

## 2018-02-05 DIAGNOSIS — Y999 Unspecified external cause status: Secondary | ICD-10-CM | POA: Diagnosis not present

## 2018-02-05 DIAGNOSIS — Z85048 Personal history of other malignant neoplasm of rectum, rectosigmoid junction, and anus: Secondary | ICD-10-CM | POA: Diagnosis not present

## 2018-02-05 DIAGNOSIS — S0990XA Unspecified injury of head, initial encounter: Secondary | ICD-10-CM

## 2018-02-05 DIAGNOSIS — R55 Syncope and collapse: Secondary | ICD-10-CM | POA: Insufficient documentation

## 2018-02-05 LAB — BASIC METABOLIC PANEL
Anion gap: 12 (ref 5–15)
BUN: 11 mg/dL (ref 6–20)
CO2: 22 mmol/L (ref 22–32)
Calcium: 9.3 mg/dL (ref 8.9–10.3)
Chloride: 102 mmol/L (ref 101–111)
Creatinine, Ser: 0.84 mg/dL (ref 0.44–1.00)
GFR calc Af Amer: >60 mL/min (ref >60)
GFR calc non Af Amer: >60 mL/min (ref >60)
Glucose, Bld: 106 mg/dL — ABNORMAL HIGH (ref 65–99)
Potassium: 3.9 mmol/L (ref 3.5–5.1)
Sodium: 136 mmol/L (ref 135–145)

## 2018-02-05 LAB — CBC WITH DIFFERENTIAL/PLATELET
Basophils Absolute: 0 10*3/uL (ref 0.0–0.1)
Basophils Relative: 0 %
Eosinophils Absolute: 0.1 10*3/uL (ref 0.0–0.7)
Eosinophils Relative: 1 %
HCT: 43.5 % (ref 36.0–46.0)
Hemoglobin: 14.2 g/dL (ref 12.0–15.0)
Lymphocytes Relative: 6 %
Lymphs Abs: 1 10*3/uL (ref 0.7–4.0)
MCH: 27.5 pg (ref 26.0–34.0)
MCHC: 32.6 g/dL (ref 30.0–36.0)
MCV: 84.1 fL (ref 78.0–100.0)
Monocytes Absolute: 0.7 10*3/uL (ref 0.1–1.0)
Monocytes Relative: 4 %
Neutro Abs: 13.9 10*3/uL — ABNORMAL HIGH (ref 1.7–7.7)
Neutrophils Relative %: 89 %
Platelets: 372 10*3/uL (ref 150–400)
RBC: 5.17 MIL/uL — ABNORMAL HIGH (ref 3.87–5.11)
RDW: 14.2 % (ref 11.5–15.5)
WBC: 15.7 10*3/uL — ABNORMAL HIGH (ref 4.0–10.5)

## 2018-02-05 LAB — PROTIME-INR
INR: 0.91
Prothrombin Time: 12.1 seconds (ref 11.4–15.2)

## 2018-02-05 MED ORDER — LORAZEPAM 2 MG/ML IJ SOLN
1.0000 mg | Freq: Once | INTRAMUSCULAR | Status: AC
  Administered 2018-02-05: 1 mg via INTRAVENOUS
  Filled 2018-02-05: qty 1

## 2018-02-05 MED ORDER — METOPROLOL TARTRATE 5 MG/5ML IV SOLN
5.0000 mg | Freq: Once | INTRAVENOUS | Status: AC
  Administered 2018-02-05: 5 mg via INTRAVENOUS
  Filled 2018-02-05: qty 5

## 2018-02-05 MED ORDER — ACETAMINOPHEN 500 MG PO TABS
1000.0000 mg | ORAL_TABLET | Freq: Once | ORAL | Status: AC
  Administered 2018-02-05: 1000 mg via ORAL
  Filled 2018-02-05: qty 2

## 2018-02-05 NOTE — ED Notes (Signed)
Patient transported to CT 

## 2018-02-05 NOTE — ED Provider Notes (Signed)
[facility] EMERGENCY DEPARTMENT Provider Note   CSN: [id number] Arrival date & time: 02/05/18  1703     History   Chief Complaint No chief complaint on file.   HPI Julie Mann is a 72 y.o. female.  Patient arrives via [facility] after a fall from a horse. Patient suffered two falls today. Initial fall after horse bucked, but patient did not suffer any injury. Was able to get up, and was riding the horse with the assistance of an instructor, when the horse turned one way and the patient fell off again, landing on her back and right shoulder. She did not lose consciousness, but was dazed. Patient does not remember the second fall. No mid-line c-spine tenderness. Normal neurologic exam except for loss of recall of last fall. Patient with history of atrial fibrillation. Treats with homeopathic medication for rate control and anti-coagulation.  The history is provided by the patient, a friend and the EMS personnel. No language interpreter was used.  Fall  This is a new problem. The current episode started less than 1 hour ago. Pertinent negatives include no abdominal pain, no headaches and no shortness of breath.    Past Medical History:  Diagnosis Date  . 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 Arthritis    "hands, fingers, back" (02/27/2014)  . Atrial fibrillation ([place])   . Cat scratch of right hand 02/24/2014  . High cholesterol   . History of stomach ulcers ~ 1966  . Osteopenia     Patient Active Problem List   Diagnosis Date Noted  . Anxiety 12/17/2016  . 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 Osteopenia 12/17/2016  . Paroxysmal atrial fibrillation ([place]) 12/17/2016  . Pure hypercholesterolemia 12/17/2016  . Rectal cancer ([place]) 07/30/2015    Past Surgical History:  Procedure Laterality Date  . ANUS SURGERY  2005   "biopsy"  . BREAST CYST EXCISION  1966  . DILATION AND CURETTAGE OF UTERUS  1980's   S/P miscarriage   . VARICOSE VEIN SURGERY Left    left leg     OB History    Gravida  5   Para  1   Term      Preterm      AB  4   Living  1     SAB      TAB      Ectopic      Multiple      Live Births               Home Medications    Prior to Admission medications   Medication Sig Start Date End Date Taking? Authorizing Provider  aspirin EC 81 MG tablet Take 1 tablet (81 mg total) by mouth daily. 08/03/17   Belva Crome, MD  Estrogens, Conjugated [facility]) Place 1 application vaginally daily as needed (irritation).    [provider]  ibuprofen (ADVIL,MOTRIN) 600 MG tablet Take 600 mg by mouth every 8 (eight) hours as needed for headache or mild pain.     [provider]  LORazepam (ATIVAN) 1 MG tablet TAKE ONE-HALF TO ONE TABLET BY MOUTH ONCE DAILY AS NEEDED FOR ANXIETY 12/06/17   Megan Salon, MD  Multiple Vitamins-Minerals (MULTIVITAMIN PO) Take 1 tablet by mouth daily.    [provider]    Family History Family History  Problem Relation Age of Onset  . Breast cancer Maternal Aunt   . Breast  cancer Maternal Grandmother   . Heart disease Mother   . Stroke Father   . Rectal cancer Paternal Grandmother   . Breast cancer Sister 72  . Breast cancer Sister 61    Social History Social History   Tobacco Use  . Smoking status: Former Smoker    Packs/day: 1.00    Years: 30.00    Pack years: 30.00    Types: Cigarettes  . Smokeless tobacco: Never Used  Substance Use Topics  . Alcohol use: No  . Drug use: No     Allergies   Doxycycline; Erythromycin; Flonase [fluticasone propionate]; Ivp dye [iodinated diagnostic agents]; Keflex [cephalexin]; Paxil [paroxetine hcl]; Premarin [conjugated estrogens]; Provera [medroxyprogesterone acetate]; Zithromax [azithromycin]; and Zoloft [sertraline hcl]   Review of Systems Review of Systems  Respiratory: Negative for shortness of breath.   Gastrointestinal: Negative for abdominal pain.   Musculoskeletal: Positive for arthralgias.  Neurological: Negative for headaches.  All other systems reviewed and are negative.    Physical Exam Updated Vital Signs LMP 11/14/1992   Physical Exam  Constitutional: She appears well-developed and well-nourished.  HENT:  Head: Normocephalic.  Eyes: Pupils are equal, round, and reactive to light. EOM are normal.  Neck: Neck supple.  Cardiovascular: An irregularly irregular rhythm present.  Pulmonary/Chest: Effort normal and breath sounds normal. She exhibits tenderness.  Right lateral rib margin tenderness without crepitus  Abdominal: Soft. Bowel sounds are normal. She exhibits no distension. There is no tenderness.  Musculoskeletal: Normal range of motion. She exhibits tenderness. She exhibits no edema or deformity.  Neurological: She is alert. No cranial nerve deficit or sensory deficit.  Skin: Skin is warm and dry.  Psychiatric: She has a normal mood and affect.  Nursing note and vitals reviewed.    ED Treatments / Results  Labs (all labs ordered are listed, but only abnormal results are displayed) Labs Reviewed  CBC WITH DIFFERENTIAL/PLATELET - Abnormal; Notable for the following components:      Result Value   WBC 15.7 (*)    RBC 5.17 (*)    Neutro Abs 13.9 (*)    All other components within normal limits  BASIC METABOLIC PANEL - Abnormal; Notable for the following components:   Glucose, Bld 106 (*)    All other components within normal limits  PROTIME-INR    EKG EKG Interpretation  Date/Time:  Monday February 05 2018 17:46:56 EDT Ventricular Rate:  152 PR Interval:    QRS Duration: 69 QT Interval:  306 QTC Calculation: 487 R Axis:   87 Text Interpretation:  Atrial fibrillation with rapid V-rate Borderline right axis deviation Repolarization abnormality, prob rate related agree. afib new as compared to previous Confirmed by Charlesetta Shanks [phone]) on 02/05/2018 6:18:35 PM   Radiology Ct Abdomen Pelvis Wo  Contrast  Result Date: 02/05/2018 CLINICAL DATA:  Ct c/a/p wo, pt allergic to iv dye, pt fell from horse x2, 15 minutes apart. Pt ws wearing a helmet that was still intact. Pt's first fall was a soft landing, pt got back on, was thrown off a second time and does not remember the circumstances of the second fall. EXAM: CT CHEST, ABDOMEN AND PELVIS WITHOUT CONTRAST TECHNIQUE: Multidetector CT imaging of the chest, abdomen and pelvis was performed following the standard protocol without IV contrast. COMPARISON:  Current chest and rib radiographs. Prior chest, abdomen and pelvis CT, 03/26/2009. FINDINGS: CT CHEST FINDINGS Cardiovascular: Heart is normal in size and configuration. There are three-vessel coronary artery calcifications. No pericardial effusion. Great vessels are  normal in caliber. Aortic atherosclerotic calcifications are noted. No evidence of a vascular injury. Mediastinum/Nodes: No mediastinal hematoma. No neck base, axillary, mediastinal or hilar masses or enlarged lymph nodes. Trachea is normal in caliber and patent. Esophagus is unremarkable. Lungs/Pleura: No lung contusion or laceration. Spiculated nodule in the right upper lobe on image 44, series 5 measuring 5 mm. 6 mm nodule in the left lower lobe, smooth and round, image 133, series 5. Both of these nodules are stable from the 2010 exam. No new lung nodules. No evidence of pneumonia or pulmonary edema. No pleural effusion or pneumothorax. Mild centrilobular emphysema. Musculoskeletal: No fracture or acute finding. No osteoblastic or osteolytic lesions. CT ABDOMEN PELVIS FINDINGS Hepatobiliary: Small calcified lesion in the lateral segment of the left lobe. Calcification has increased from the prior study. No change in overall size. No other liver lesion. No contusion or laceration. Normal gallbladder. No bile duct dilation. Pancreas: No pancreatic contusion or laceration. No mass or inflammation. Spleen: Normal in size. No contusion or  laceration. No mass or focal lesion. Adrenals/Urinary Tract: No adrenal mass or hemorrhage. No evidence of a renal contusion or laceration. No renal masses, stones or hydronephrosis. Normal ureters. Normal bladder. Stomach/Bowel: No evidence of a bowel injury. No mesenteric hematoma. Stomach is unremarkable. Small bowel and colon are normal in caliber. No wall thickening or inflammatory changes. Normal appendix visualized. Vascular/Lymphatic: No evidence of a vascular injury. Atherosclerotic changes noted throughout the abdominal aorta extending into the iliac vessels. No enlarged lymph nodes. Reproductive: Uterus and bilateral adnexa are unremarkable. Other: No abdominal wall contusion or hernia. No ascites or hemoperitoneum. Musculoskeletal: No fracture or acute finding. No osteoblastic or osteolytic lesions. IMPRESSION: CHEST CT 1. No acute injury to the chest. 2. Coronary artery calcifications and aortic atherosclerosis. 3. Two stable pulmonary nodules, stable for almost 9 years, benign. 4. Emphysema. ABDOMEN AND PELVIS CT 1. No acute injury to the abdomen or pelvis. 2. Aortic atherosclerosis. Electronically Signed   By: Lajean Manes M.D.   On: 02/05/2018 22:06   Dg Ribs Unilateral W/chest Right  Result Date: 02/05/2018 CLINICAL DATA:  Fall from horse.  Right rib pain. EXAM: RIGHT RIBS AND CHEST - 3+ VIEW COMPARISON:  10/25/2016 FINDINGS: Heart and mediastinal contours are within normal limits. No focal opacities or effusions. No acute bony abnormality. No visible rib fracture. No pneumothorax. IMPRESSION: No active cardiopulmonary disease. Electronically Signed   By: Rolm Baptise M.D.   On: 02/05/2018 19:20   Ct Head Wo Contrast  Result Date: 02/05/2018 CLINICAL DATA:  72 year old female status post fall, thrown from horse x2 today. Wearing helmet. Brief loss of consciousness and disoriented. EXAM: CT HEAD WITHOUT CONTRAST TECHNIQUE: Contiguous axial images were obtained from the base of the skull  through the vertex without intravenous contrast. COMPARISON:  Cervical spine MRI 02/09/2017. Brain MRI and noncontrast head CT 10/25/2016 FINDINGS: Brain: Cerebral volume remains normal for age. Small perivascular space along the inferior right lentiform is unchanged (normal variant). No midline shift, ventriculomegaly, mass effect, evidence of mass lesion, intracranial hemorrhage or evidence of cortically based acute infarction. Gray-white matter differentiation is within normal limits throughout the brain. Vascular: Calcified atherosclerosis at the skull base. No suspicious intracranial vascular hyperdensity. Skull: Stable and intact. Sinuses/Orbits: Clear, along with bilateral tympanic cavities and mastoid air cells. Other: No acute scalp or orbits soft tissue findings. Negative visible noncontrast deep soft tissue spaces of the face. IMPRESSION: Stable and normal for age non contrast appearance of the brain. No  acute traumatic injury identified. Electronically Signed   By: Genevie Ann M.D.   On: 02/05/2018 18:56   Ct Chest Wo Contrast  Result Date: 02/05/2018 CLINICAL DATA:  Ct c/a/p wo, pt allergic to iv dye, pt fell from horse x2, 15 minutes apart. Pt ws wearing a helmet that was still intact. Pt's first fall was a soft landing, pt got back on, was thrown off a second time and does not remember the circumstances of the second fall. EXAM: CT CHEST, ABDOMEN AND PELVIS WITHOUT CONTRAST TECHNIQUE: Multidetector CT imaging of the chest, abdomen and pelvis was performed following the standard protocol without IV contrast. COMPARISON:  Current chest and rib radiographs. Prior chest, abdomen and pelvis CT, 03/26/2009. FINDINGS: CT CHEST FINDINGS Cardiovascular: Heart is normal in size and configuration. There are three-vessel coronary artery calcifications. No pericardial effusion. Great vessels are normal in caliber. Aortic atherosclerotic calcifications are noted. No evidence of a vascular injury. Mediastinum/Nodes:  No mediastinal hematoma. No neck base, axillary, mediastinal or hilar masses or enlarged lymph nodes. Trachea is normal in caliber and patent. Esophagus is unremarkable. Lungs/Pleura: No lung contusion or laceration. Spiculated nodule in the right upper lobe on image 44, series 5 measuring 5 mm. 6 mm nodule in the left lower lobe, smooth and round, image 133, series 5. Both of these nodules are stable from the 2010 exam. No new lung nodules. No evidence of pneumonia or pulmonary edema. No pleural effusion or pneumothorax. Mild centrilobular emphysema. Musculoskeletal: No fracture or acute finding. No osteoblastic or osteolytic lesions. CT ABDOMEN PELVIS FINDINGS Hepatobiliary: Small calcified lesion in the lateral segment of the left lobe. Calcification has increased from the prior study. No change in overall size. No other liver lesion. No contusion or laceration. Normal gallbladder. No bile duct dilation. Pancreas: No pancreatic contusion or laceration. No mass or inflammation. Spleen: Normal in size. No contusion or laceration. No mass or focal lesion. Adrenals/Urinary Tract: No adrenal mass or hemorrhage. No evidence of a renal contusion or laceration. No renal masses, stones or hydronephrosis. Normal ureters. Normal bladder. Stomach/Bowel: No evidence of a bowel injury. No mesenteric hematoma. Stomach is unremarkable. Small bowel and colon are normal in caliber. No wall thickening or inflammatory changes. Normal appendix visualized. Vascular/Lymphatic: No evidence of a vascular injury. Atherosclerotic changes noted throughout the abdominal aorta extending into the iliac vessels. No enlarged lymph nodes. Reproductive: Uterus and bilateral adnexa are unremarkable. Other: No abdominal wall contusion or hernia. No ascites or hemoperitoneum. Musculoskeletal: No fracture or acute finding. No osteoblastic or osteolytic lesions. IMPRESSION: CHEST CT 1. No acute injury to the chest. 2. Coronary artery calcifications and  aortic atherosclerosis. 3. Two stable pulmonary nodules, stable for almost 9 years, benign. 4. Emphysema. ABDOMEN AND PELVIS CT 1. No acute injury to the abdomen or pelvis. 2. Aortic atherosclerosis. Electronically Signed   By: Lajean Manes M.D.   On: 02/05/2018 22:06    Procedures Procedures (including critical care time)  Medications Ordered in ED Medications - No data to display   Initial Impression / Assessment and Plan / ED Course  I have reviewed the triage vital signs and the nursing notes.  Pertinent labs & imaging results that were available during my care of the patient were reviewed by me and considered in my medical decision making (see chart for details).     Patient discussed with and seen by Dr. Johnney Killian.  Patient symptoms consistent with concussion after falling from a horse. No vomiting. No focal neurological deficits  on physical exam, but patient is unable to recall the fall. Patient also with right rib pain. Radiology results reviewed (CXR,CT head, chest, abdomen), no acute findings. Patient with atrial fib with RVR (history of same). Not currently on rate control medication. Takes a daily aspirin. Responded to metoprolol with conversion to NSR.  Pt observed in the ED.   Discussed symptoms of post concussive syndrome and reasons to return to the emergency department including any new  severe headaches, disequilibrium, vomiting, double vision, extremity weakness, difficulty ambulating, or any other concerning symptoms. Patient will be discharged with information pertaining to diagnosis. Patient to follow up with her PCP and cardiologist. Pt is safe for discharge at this time. Final Clinical Impressions(s) / ED Diagnoses   Final diagnoses:  Fall, initial encounter  Minor head injury, initial encounter  Paroxysmal atrial fibrillation [facility])    ED Discharge Orders    None       Etta Quill, NP 02/06/18 [place]    Charlesetta Shanks, MD 02/18/18 1535

## 2018-02-05 NOTE — ED Triage Notes (Signed)
Per EMS: pt fell from horse x2, 15 minutes apart.  Pt ws wearing a helmet that was still intact.  Pt's first fall was a soft landing, pt got back on, was thrown off a second time and does not remember the circumstances of the second fall.  Pt able to move all extremities equally.  Pt alert and oriented.

## 2018-02-05 NOTE — Discharge Instructions (Signed)
Please continue with your daily aspirin. Follow-up with your PCP and cardiologist.

## 2018-02-05 NOTE — ED Notes (Signed)
ED Provider at bedside. 

## 2018-02-20 ENCOUNTER — Other Ambulatory Visit: Payer: Self-pay | Admitting: Obstetrics & Gynecology

## 2018-02-20 NOTE — Telephone Encounter (Signed)
Medication refill request: Ativan  Last AEX:  11-25-16  Next AEX: 02-23-18  Last MMG (if hormonal medication request): 04-03-17 WNl  Refill authorized: please advise

## 2018-02-23 ENCOUNTER — Other Ambulatory Visit: Payer: Self-pay

## 2018-02-23 ENCOUNTER — Encounter: Payer: Self-pay | Admitting: Obstetrics & Gynecology

## 2018-02-23 ENCOUNTER — Ambulatory Visit (INDEPENDENT_AMBULATORY_CARE_PROVIDER_SITE_OTHER): Payer: Medicare Other | Admitting: Obstetrics & Gynecology

## 2018-02-23 VITALS — BP 136/60 | HR 88 | Resp 16 | Ht 66.5 in | Wt 132.8 lb

## 2018-02-23 DIAGNOSIS — Z01419 Encounter for gynecological examination (general) (routine) without abnormal findings: Secondary | ICD-10-CM

## 2018-02-23 NOTE — Progress Notes (Addendum)
72 y.o. [id number] MarriedCaucasianF here for annual exam.  Has a fib.  Could not tolerate anticoagulation.  Is taking a baby ASA.  Has seen Dr. Tamala Julian.  Feels these appts are useless.  Using some OTC (holistic) medication.  Still seeing Dr. Felipa Eth.  She saw him last week.  This was follow-up after falling off horse and having head injury with helmet.  Still sore but better.    Finally figured out her headaches were related to how she was playing her violin.    Patient's last menstrual period was 11/14/1992.          Sexually active: No.  The current method of family planning is post menopausal status.    Exercising: Yes.    walking, exercise routine  Smoker:  no  Health Maintenance: Pap:  11/25/16 Neg   07/30/15 Neg  History of abnormal Pap:  no MMG:  04/03/17 BIRADS1:neg   Colonoscopy: 2016 f/u 5 years  BMD:   2014 Osteopenia.  Did BMD with Dr. Felipa Eth this year.  Pt reports this was stable.  (addendum--report was received with lowest T score of -1.4 in right femoral neck) TDaP:  2016 Pneumonia vaccine(s):  2015 Shingrix: Zostavax done  Hep C testing: 11/25/16 neg  Screening Labs: PCP   reports that she has quit smoking. Her smoking use included cigarettes. She has a 30.00 pack-year smoking history. She has never used smokeless tobacco. She reports that she does not drink alcohol or use drugs.  Past Medical History:  Diagnosis Date  . 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 Arthritis    "hands, fingers, back" (02/27/2014)  . Atrial fibrillation ([place])   . Cat scratch of right hand 02/24/2014  . Fall 01/2018  . Fall from horse 01/2018  . High cholesterol   . History of stomach ulcers ~ 1966  . Osteopenia     Past Surgical History:  Procedure Laterality Date  . ANUS SURGERY  2005   "biopsy"  . BREAST CYST EXCISION  1966  . DILATION AND CURETTAGE OF UTERUS  1980's   S/P miscarriage  . VARICOSE VEIN SURGERY Left    left leg    Current Outpatient  Medications  Medication Sig Dispense Refill  . aspirin EC 81 MG tablet Take 1 tablet (81 mg total) by mouth daily. 90 tablet 3  . ibuprofen (ADVIL,MOTRIN) 600 MG tablet Take 600 mg by mouth every 8 (eight) hours as needed for headache or mild pain.     Marland Kitchen LORazepam (ATIVAN) 1 MG tablet TAKE ONE-HALF TO ONE TABLET BY MOUTH ONCE DAILY AS NEEDED FOR ANXIETY 30 tablet 0  . Multiple Vitamins-Minerals (MULTIVITAMIN PO) Take 1 tablet by mouth daily.    Marland Kitchen OVER THE COUNTER MEDICATION Take 5 mLs by mouth daily. 1 teaspoon of vinegar    . OVER THE COUNTER MEDICATION daily. Magnesium oil  Rub on top of the body    . TURMERIC PO Take 5 mLs by mouth daily.    . metoprolol tartrate (LOPRESSOR) 25 MG tablet Take 1 tablet by mouth daily as needed.      No current facility-administered medications for this visit.     Family History  Problem Relation Age of Onset  . Breast cancer Maternal Aunt   . Breast cancer Maternal Grandmother   . Heart disease Mother   . Stroke Father   . Rectal cancer Paternal Grandmother   . Breast cancer Sister 66  . Breast cancer Sister  73    Review of Systems  Genitourinary:       Night urination Loss of sexual interest  Pain with intercourse   All other systems reviewed and are negative.   Exam:   BP 136/60 (BP Location: Right Arm, Patient Position: Sitting, Cuff Size: Normal)   Pulse 88   Resp 16   Ht 5' 6.5" (1.689 m)   Wt 132 lb 12.8 oz (60.2 kg)   LMP 11/14/1992   BMI 21.11 kg/m    Height: 5' 6.5" (168.9 cm)  Ht Readings from Last 3 Encounters:  02/23/18 5' 6.5" (1.689 m)  02/05/18 5' 6.5" (1.689 m)  08/03/17 5\' 7"  (1.702 m)    General appearance: alert, cooperative and appears stated age Head: Normocephalic, without obvious abnormality, atraumatic Neck: no adenopathy, supple, symmetrical, trachea midline and thyroid normal to inspection and palpation Lungs: clear to auscultation bilaterally Breasts: normal appearance, no masses or tenderness Heart:  regular rate and rhythm Abdomen: soft, non-tender; bowel sounds normal; no masses,  no organomegaly Extremities: extremities normal, atraumatic, no cyanosis or edema Skin: Skin color, texture, turgor normal. No rashes or lesions Lymph nodes: Cervical, supraclavicular, and axillary nodes normal. No abnormal inguinal nodes palpated Neurologic: Grossly normal   Pelvic: External genitalia:  no lesions, radiation changes--stable              Urethra:  normal appearing urethra with no masses, tenderness or lesions              Bartholins and Skenes: normal                 Vagina: normal appearing vagina with normal color and discharge, no lesions              Cervix: no lesions              Pap taken: No. Bimanual Exam:  Uterus:  normal size, contour, position, consistency, mobility, non-tender              Adnexa: normal adnexa and no mass, fullness, tenderness               Rectovaginal: Confirms               Anus:  normal sphincter tone, no lesions  Chaperone was present for exam.  A:  Well Woman with normal exam H/O SCC of anus, 2005 Joint and neck pain from long term violin playing Insomnia Elevated lipids  P:   Mammogram guidelines reviewed.  Doing yearly. Release of BMD will be signed today pap smear not indicated today Doesn't need RF of Lorazepam today Lab work done with Dr. Felipa Eth return annually or prn

## 2018-04-10 ENCOUNTER — Other Ambulatory Visit: Payer: Self-pay | Admitting: Obstetrics & Gynecology

## 2018-04-10 DIAGNOSIS — Z1231 Encounter for screening mammogram for malignant neoplasm of breast: Secondary | ICD-10-CM

## 2018-04-30 ENCOUNTER — Ambulatory Visit
Admission: RE | Admit: 2018-04-30 | Discharge: 2018-04-30 | Disposition: A | Discharge status: Home / Self Care | Payer: Medicare Other | Source: Ambulatory Visit | Attending: Obstetrics & Gynecology | Admitting: Obstetrics & Gynecology

## 2018-04-30 DIAGNOSIS — Z1231 Encounter for screening mammogram for malignant neoplasm of breast: Secondary | ICD-10-CM

## 2018-05-11 ENCOUNTER — Other Ambulatory Visit: Payer: Self-pay | Admitting: Obstetrics & Gynecology

## 2018-05-11 NOTE — Telephone Encounter (Signed)
Medication refill request: Ibuprofen 600mg   Last AEX:  02/23/18 Next AEX: 04/05/19 Last MMG: 05/01/18  Bi-Rads Category 1: Neg Refill authorized: Please refill if appropriate.

## 2018-05-30 ENCOUNTER — Other Ambulatory Visit: Payer: Self-pay | Admitting: Obstetrics & Gynecology

## 2018-05-30 NOTE — Telephone Encounter (Signed)
Medication refill request: ativan 1mg   Last AEX:  02/23/18 Next AEX: 04/05/19 Last MMG (if hormonal medication request): 05/01/18 Bi rads category 1 neg.  Refill authorized: please advise

## 2018-08-23 ENCOUNTER — Encounter: Payer: Self-pay | Admitting: Interventional Cardiology

## 2018-08-23 ENCOUNTER — Ambulatory Visit: Payer: Medicare Other | Admitting: Interventional Cardiology

## 2018-08-23 VITALS — BP 146/76 | HR 76 | Ht 67.5 in | Wt 129.1 lb

## 2018-08-23 DIAGNOSIS — Z7901 Long term (current) use of anticoagulants: Secondary | ICD-10-CM

## 2018-08-23 DIAGNOSIS — E7800 Pure hypercholesterolemia, unspecified: Secondary | ICD-10-CM

## 2018-08-23 DIAGNOSIS — I480 Paroxysmal atrial fibrillation: Secondary | ICD-10-CM | POA: Diagnosis not present

## 2018-08-23 DIAGNOSIS — F419 Anxiety disorder, unspecified: Secondary | ICD-10-CM

## 2018-08-23 NOTE — Progress Notes (Signed)
Cardiology Office Note:    Date:  08/23/2018   ID:  Julie, Mann [birth year], MRN [id number]  PCP:  Lajean Manes, MD  Cardiologist:  No primary care provider on file.   Referring MD: Lajean Manes, MD   Chief Complaint  Patient presents with  . Atrial Fibrillation    History of Present Illness:    Julie Mann is a 72 y.o. female with a hx of paroxysmal atrial fibrillation that required diltiazem and flecainide for conversion during an emergency room visit in late 2017. CHADS VASC score is 2 .  She is doing well currently.  She rides horses.  She was thrown from her horse in March, had a concussion, and also developed atrial fibrillation with rapid ventricular response.  IV metoprolol resolve the arrhythmia.  She was given Prelone-metoprolol to use if she develops atrial fibrillation.  She feels that she is aware of every episode that she has.  We have previously discussed anticoagulation therapy which she refuses after we attempted to use both Xarelto and Eliquis.  She had side effects on both medications that was somewhat questionable and occurred after 1 dose of each therapy.  She did not have any particular bleeding episodes.  She denies chest pain.  She has had no neurological events.   Past Medical History:  Diagnosis Date  . 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 Arthritis    "hands, fingers, back" (02/27/2014)  . Atrial fibrillation ([place])   . Cat scratch of right hand 02/24/2014  . Fall 01/2018  . Fall from horse 01/2018  . High cholesterol   . History of stomach ulcers ~ 1966  . Osteopenia     Past Surgical History:  Procedure Laterality Date  . ANUS SURGERY  2005   "biopsy"  . BREAST CYST EXCISION  1966  . DILATION AND CURETTAGE OF UTERUS  1980's   S/P miscarriage  . VARICOSE VEIN SURGERY Left    left leg    Current Medications: Current Meds  Medication Sig  . aspirin EC 81 MG tablet Take 1 tablet (81 mg total) by mouth  daily.  Marland Kitchen atorvastatin (LIPITOR) 10 MG tablet Take 10 mg by mouth 2 (two) times a week.  Marland Kitchen ibuprofen (ADVIL,MOTRIN) 600 MG tablet Take 600 mg by mouth every 8 (eight) hours as needed for headache or mild pain.   Marland Kitchen LORazepam (ATIVAN) 1 MG tablet TAKE 1/2-1 TABLET BY MOUTH ONCE DAILY ASNEEDED FOR ANXIETY  . metoprolol tartrate (LOPRESSOR) 25 MG tablet Take 1 tablet by mouth daily as needed (palpitations).   . Multiple Vitamins-Minerals (MULTIVITAMIN PO) Take 1 tablet by mouth daily.  Marland Kitchen OVER THE COUNTER MEDICATION Take 5 mLs by mouth daily. 1 teaspoon of vinegar  . OVER THE COUNTER MEDICATION daily. Magnesium oil  Rub on top of the body  . OVER THE COUNTER MEDICATION Liquid Acidophilus - Take one (1) tablespoon by mouth two (2) to three (3) times daily after meals.     Allergies:   Eliquis [apixaban]; Ivp dye [iodinated diagnostic agents]; Doxycycline; Erythromycin; Flonase [fluticasone propionate]; Keflex [cephalexin]; Paxil [paroxetine hcl]; Premarin [conjugated estrogens]; Provera [medroxyprogesterone acetate]; Xarelto [rivaroxaban]; Zithromax [azithromycin]; and Zoloft [sertraline hcl]   Social History   Socioeconomic History  . Marital status: Married    Spouse name: Not on file  . Number of children: Not on file  . Years of education: Not on file  . Highest education level: Not on file  Occupational  History  . Not on file  Social Needs  . Financial resource strain: Not on file  . Food insecurity:    Worry: Not on file    Inability: Not on file  . Transportation needs:    Medical: Not on file    Non-medical: Not on file  Tobacco Use  . Smoking status: Former Smoker    Packs/day: 1.00    Years: 30.00    Pack years: 30.00    Types: Cigarettes  . Smokeless tobacco: Never Used  Substance and Sexual Activity  . Alcohol use: No  . Drug use: No  . Sexual activity: Yes    Partners: Male    Birth control/protection: Post-menopausal  Lifestyle  . Physical activity:    Days per  week: Not on file    Minutes per session: Not on file  . Stress: Not on file  Relationships  . Social connections:    Talks on phone: Not on file    Gets together: Not on file    Attends religious service: Not on file    Active member of club or organization: Not on file    Attends meetings of clubs or organizations: Not on file    Relationship status: Not on file  Other Topics Concern  . Not on file  Social History Narrative  . Not on file     Family History: The patient's family history includes Breast cancer in her maternal aunt and maternal grandmother; Breast cancer (age of onset: 55) in her sister; Breast cancer (age of onset: 71) in her sister; Heart disease in her mother; Rectal cancer in her paternal grandmother; Stroke in her father.  ROS:   Please see the history of present illness.    Horseback riding accident with concussion.  She believes she has had at least 5 episodes of atrial fibrillation over the past year.  She does not believe an episode is lasting longer than 30 minutes.  All other systems reviewed and are negative.  EKGs/Labs/Other Studies Reviewed:    The following studies were reviewed today: Reviewed EKGs from the hospital stay which revealed A. fib with RVR and ST-T wave abnormality.  EKG:  EKG is not ordered today.   Recent Labs: 02/05/2018: BUN 11; Creatinine, Ser 0.84; Hemoglobin 14.2; Platelets 372; Potassium 3.9; Sodium 136  Recent Lipid Panel No results found for: CHOL, TRIG, HDL, CHOLHDL, VLDL, LDLCALC, LDLDIRECT  Physical Exam:    VS:  BP (!) 146/76   Pulse 76   Ht 5' 7.5" (1.715 m)   Wt 129 lb 1.9 oz (58.6 kg)   LMP 11/14/1992   BMI 19.92 kg/m     Wt Readings from Last 3 Encounters:  08/23/18 129 lb 1.9 oz (58.6 kg)  02/23/18 132 lb 12.8 oz (60.2 kg)  02/05/18 130 lb (59 kg)     GEN:  Well nourished, well developed in no acute distress HEENT: Normal NECK: No JVD. LYMPHATICS: No lymphadenopathy CARDIAC: RRR, no murmur, S4  gallop, no edema. VASCULAR: 2+ pulses.  No bruits. RESPIRATORY:  Clear to auscultation without rales, wheezing or rhonchi  ABDOMEN: Soft, non-tender, non-distended, No pulsatile mass, MUSCULOSKELETAL: No deformity  SKIN: Warm and dry NEUROLOGIC:  Alert and oriented x 3 PSYCHIATRIC:  Normal affect   ASSESSMENT:    1. Paroxysmal atrial fibrillation (HCC)   2. Pure hypercholesterolemia   3. Anxiety   4. Chronic anticoagulation    PLAN:    In order of problems listed above:  1. Continues  to have episodes.  Chads VASC is greater than 2.  She is not willing to take anticoagulation therapy although it is indicated.  She remains on aspirin.  She continues to ride horses and is worried about head trauma and bleeding if on anticoagulation therapy.  She did try Eliquis and Xarelto but had funny side effects which led her to discontinue the medications after 1 dose. 2. She is now on low-dose statin therapy treating high risk hyperlipidemia with LDL cholesterol greater than 170.  This is being managed by Dr. Mertha Finders.  Total cholesterol was 280. 3. Not addressed 4. Not on anticoagulation therapy though PAF continues and her risk profile also suggests that anticoagulation would be best for her to prevent stroke.  Clinical follow-up in 1 year.  Call us if she changes her mind about anticoagulation.  Call us if persistent atrial fibrillation.  Call if any neurological complaints.   Medication Adjustments/Labs and Tests Ordered: Current medicines are reviewed at length with the patient today.  Concerns regarding medicines are outlined above.  No orders of the defined types were placed in this encounter.  No orders of the defined types were placed in this encounter.   There are no Patient Instructions on file for this visit.   Signed, Sinclair Grooms, MD  08/23/2018 1:59 PM    [facility]

## 2018-08-23 NOTE — Patient Instructions (Signed)

## 2018-09-25 ENCOUNTER — Other Ambulatory Visit: Payer: Self-pay

## 2018-09-25 NOTE — Telephone Encounter (Signed)
Medication refill request: Ativan 1mg  Last AEX:  02/23/18 Next AEX: 04/05/19 Last MMG (if hormonal medication request):05/01/18    Bi-rads 1 Neg  Refill authorized: #30 with 0RF

## 2018-09-26 MED ORDER — LORAZEPAM 1 MG PO TABS: ORAL_TABLET | ORAL | 0 refills | Status: DC

## 2018-12-05 ENCOUNTER — Other Ambulatory Visit: Payer: Self-pay | Admitting: Obstetrics & Gynecology

## 2018-12-05 NOTE — Telephone Encounter (Signed)
Medication refill request: LORazepam Last AEX:  02/23/18 SM Next AEX: 04/05/19 Last MMG (if hormonal medication request): 04/30/18 BIRADS 1 negative/density b Refill authorized: 09/26/18 #30 w/0 refills; today please advise order has been pended for #30 w/0 refills

## 2018-12-24 ENCOUNTER — Other Ambulatory Visit: Payer: Self-pay | Admitting: Obstetrics & Gynecology

## 2018-12-24 NOTE — Telephone Encounter (Signed)
Patient is calling for a refill on her ibuprofen 600MG . Patient confirmed pharmacy as [facility] [address].

## 2018-12-24 NOTE — Telephone Encounter (Signed)
Medication refill request: Ibuprofen 600mg  Last AEX:  02-23-18 SM  Next AEX: 04-05-2019 Last MMG (if hormonal medication request): n/a Refill authorized: Please advise.   Medication pended for #30, 0RF. Please refill if appropriate.

## 2018-12-25 MED ORDER — IBUPROFEN 600 MG PO TABS: 600.0000 mg | ORAL_TABLET | Freq: Three times a day (TID) | ORAL | 0 refills | Status: DC | PRN

## 2019-02-13 DIAGNOSIS — S82899A Other fracture of unspecified lower leg, initial encounter for closed fracture: Secondary | ICD-10-CM

## 2019-02-13 HISTORY — DX: Other fracture of unspecified lower leg, initial encounter for closed fracture: S82.899A

## 2019-02-19 ENCOUNTER — Other Ambulatory Visit: Payer: Self-pay

## 2019-02-19 ENCOUNTER — Ambulatory Visit (INDEPENDENT_AMBULATORY_CARE_PROVIDER_SITE_OTHER): Payer: Medicare Other | Admitting: Orthopaedic Surgery

## 2019-02-19 ENCOUNTER — Ambulatory Visit (INDEPENDENT_AMBULATORY_CARE_PROVIDER_SITE_OTHER): Payer: Medicare Other

## 2019-02-19 ENCOUNTER — Encounter (INDEPENDENT_AMBULATORY_CARE_PROVIDER_SITE_OTHER): Payer: Self-pay | Admitting: Orthopaedic Surgery

## 2019-02-19 VITALS — BP 149/65 | HR 71 | Ht 66.5 in | Wt 130.0 lb

## 2019-02-19 DIAGNOSIS — G8929 Other chronic pain: Secondary | ICD-10-CM | POA: Insufficient documentation

## 2019-02-19 DIAGNOSIS — M25512 Pain in left shoulder: Secondary | ICD-10-CM

## 2019-02-19 MED ORDER — BUPIVACAINE HCL 0.5 % IJ SOLN
2.0000 mL | INTRAMUSCULAR | Status: AC | PRN
  Administered 2019-02-19: 2 mL via INTRA_ARTICULAR

## 2019-02-19 MED ORDER — LIDOCAINE HCL 1 % IJ SOLN
2.0000 mL | INTRAMUSCULAR | Status: AC | PRN
  Administered 2019-02-19: 2 mL

## 2019-02-19 MED ORDER — METHYLPREDNISOLONE ACETATE 40 MG/ML IJ SUSP
80.0000 mg | INTRAMUSCULAR | Status: AC | PRN
  Administered 2019-02-19: 80 mg via INTRA_ARTICULAR
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
## 2019-03-08 ENCOUNTER — Ambulatory Visit
Admission: RE | Admit: 2019-03-08 | Discharge: 2019-03-08 | Disposition: A | Discharge status: Home / Self Care | Payer: Medicare Other | Source: Ambulatory Visit | Attending: Sports Medicine | Admitting: Sports Medicine

## 2019-03-08 ENCOUNTER — Other Ambulatory Visit: Payer: Self-pay | Admitting: Sports Medicine

## 2019-03-08 ENCOUNTER — Other Ambulatory Visit: Payer: Self-pay

## 2019-03-08 DIAGNOSIS — M25571 Pain in right ankle and joints of right foot: Secondary | ICD-10-CM

## 2019-03-09 ENCOUNTER — Encounter (INDEPENDENT_AMBULATORY_CARE_PROVIDER_SITE_OTHER): Payer: Self-pay | Admitting: Orthopaedic Surgery

## 2019-03-10 ENCOUNTER — Other Ambulatory Visit: Payer: Self-pay

## 2019-03-10 ENCOUNTER — Encounter (HOSPITAL_COMMUNITY)
Admission: RE | Disposition: A | Discharge status: Home Health | Payer: Self-pay | Source: Home / Self Care | Attending: Orthopedic Surgery

## 2019-03-10 ENCOUNTER — Encounter (HOSPITAL_COMMUNITY): Payer: Self-pay | Admitting: Surgery

## 2019-03-10 ENCOUNTER — Inpatient Hospital Stay (HOSPITAL_COMMUNITY): Payer: Medicare Other | Admitting: Certified Registered Nurse Anesthetist

## 2019-03-10 ENCOUNTER — Inpatient Hospital Stay (HOSPITAL_COMMUNITY)
Admission: RE | Admit: 2019-03-10 | Discharge: 2019-03-12 | DRG: 494 | Disposition: A | Discharge status: Home Health | Payer: Medicare Other | Attending: Orthopedic Surgery | Admitting: Orthopedic Surgery

## 2019-03-10 DIAGNOSIS — X58XXXA Exposure to other specified factors, initial encounter: Secondary | ICD-10-CM | POA: Diagnosis present

## 2019-03-10 DIAGNOSIS — Z888 Allergy status to other drugs, medicaments and biological substances status: Secondary | ICD-10-CM

## 2019-03-10 DIAGNOSIS — Z881 Allergy status to other antibiotic agents status: Secondary | ICD-10-CM | POA: Diagnosis not present

## 2019-03-10 DIAGNOSIS — S9304XA Dislocation of right ankle joint, initial encounter: Secondary | ICD-10-CM | POA: Diagnosis present

## 2019-03-10 DIAGNOSIS — Z7982 Long term (current) use of aspirin: Secondary | ICD-10-CM | POA: Diagnosis not present

## 2019-03-10 DIAGNOSIS — Z85048 Personal history of other malignant neoplasm of rectum, rectosigmoid junction, and anus: Secondary | ICD-10-CM | POA: Diagnosis not present

## 2019-03-10 DIAGNOSIS — Z79899 Other long term (current) drug therapy: Secondary | ICD-10-CM | POA: Diagnosis not present

## 2019-03-10 DIAGNOSIS — F17210 Nicotine dependence, cigarettes, uncomplicated: Secondary | ICD-10-CM | POA: Diagnosis present

## 2019-03-10 DIAGNOSIS — S82851A Displaced trimalleolar fracture of right lower leg, initial encounter for closed fracture: Principal | ICD-10-CM | POA: Diagnosis present

## 2019-03-10 DIAGNOSIS — Z79891 Long term (current) use of opiate analgesic: Secondary | ICD-10-CM

## 2019-03-10 DIAGNOSIS — M810 Age-related osteoporosis without current pathological fracture: Secondary | ICD-10-CM | POA: Diagnosis present

## 2019-03-10 DIAGNOSIS — Z91041 Radiographic dye allergy status: Secondary | ICD-10-CM | POA: Diagnosis not present

## 2019-03-10 DIAGNOSIS — Z9221 Personal history of antineoplastic chemotherapy: Secondary | ICD-10-CM

## 2019-03-10 DIAGNOSIS — Z923 Personal history of irradiation: Secondary | ICD-10-CM | POA: Diagnosis not present

## 2019-03-10 HISTORY — DX: Cardiac arrhythmia, unspecified: I49.9

## 2019-03-10 HISTORY — PX: EXTERNAL FIXATION LEG: SHX1549

## 2019-03-10 LAB — BASIC METABOLIC PANEL
Anion gap: 11 (ref 5–15)
BUN: 17 mg/dL (ref 8–23)
CO2: 22 mmol/L (ref 22–32)
Calcium: 9.1 mg/dL (ref 8.9–10.3)
Chloride: 106 mmol/L (ref 98–111)
Creatinine, Ser: 0.87 mg/dL (ref 0.44–1.00)
GFR calc Af Amer: >60 mL/min (ref >60)
GFR calc non Af Amer: >60 mL/min (ref >60)
Glucose, Bld: 117 mg/dL — ABNORMAL HIGH (ref 70–99)
Potassium: 3.8 mmol/L (ref 3.5–5.1)
Sodium: 139 mmol/L (ref 135–145)

## 2019-03-10 LAB — CBC
HCT: 40.5 % (ref 36.0–46.0)
Hemoglobin: 12.8 g/dL (ref 12.0–15.0)
MCH: 27.2 pg (ref 26.0–34.0)
MCHC: 31.6 g/dL (ref 30.0–36.0)
MCV: 86.2 fL (ref 80.0–100.0)
Platelets: 287 10*3/uL (ref 150–400)
RBC: 4.7 MIL/uL (ref 3.87–5.11)
RDW: 14.1 % (ref 11.5–15.5)
WBC: 13.1 10*3/uL — ABNORMAL HIGH (ref 4.0–10.5)
nRBC: 0 % (ref 0.0–0.2)

## 2019-03-10 SURGERY — EXTERNAL FIXATION, LOWER EXTREMITY
Anesthesia: General | Site: Ankle | Laterality: Right

## 2019-03-10 MED ORDER — EPHEDRINE 5 MG/ML INJ
INTRAVENOUS | Status: AC
  Filled 2019-03-10: qty 10

## 2019-03-10 MED ORDER — PROPOFOL 10 MG/ML IV BOLUS
INTRAVENOUS | Status: AC
  Filled 2019-03-10: qty 20

## 2019-03-10 MED ORDER — METOPROLOL TARTRATE 25 MG PO TABS
25.0000 mg | ORAL_TABLET | Freq: Every day | ORAL | Status: DC | PRN
  Administered 2019-03-11: 25 mg via ORAL
  Filled 2019-03-10: qty 1

## 2019-03-10 MED ORDER — KETOROLAC TROMETHAMINE 15 MG/ML IJ SOLN
15.0000 mg | Freq: Four times a day (QID) | INTRAMUSCULAR | Status: AC
  Administered 2019-03-10 – 2019-03-11 (×5): 15 mg via INTRAVENOUS
  Filled 2019-03-10 (×5): qty 1

## 2019-03-10 MED ORDER — LIDOCAINE-EPINEPHRINE (PF) 1.5 %-1:200000 IJ SOLN
INTRAMUSCULAR | Status: DC | PRN
  Administered 2019-03-10: 15 mL via PERINEURAL

## 2019-03-10 MED ORDER — CLINDAMYCIN PHOSPHATE 900 MG/50ML IV SOLN
900.0000 mg | INTRAVENOUS | Status: DC
  Filled 2019-03-10: qty 50

## 2019-03-10 MED ORDER — CEFAZOLIN SODIUM-DEXTROSE 2-4 GM/100ML-% IV SOLN
2.0000 g | INTRAVENOUS | Status: AC
  Administered 2019-03-10: 2 g via INTRAVENOUS

## 2019-03-10 MED ORDER — ASPIRIN EC 81 MG PO TBEC
81.0000 mg | DELAYED_RELEASE_TABLET | Freq: Every day | ORAL | Status: DC
  Administered 2019-03-11: 81 mg via ORAL
  Filled 2019-03-10 (×2): qty 1

## 2019-03-10 MED ORDER — LIDOCAINE 2% (20 MG/ML) 5 ML SYRINGE
INTRAMUSCULAR | Status: DC | PRN
  Administered 2019-03-10: 80 mg via INTRAVENOUS

## 2019-03-10 MED ORDER — POTASSIUM CHLORIDE IN NACL 20-0.9 MEQ/L-% IV SOLN
INTRAVENOUS | Status: DC
  Administered 2019-03-10 – 2019-03-12 (×3): via INTRAVENOUS
  Filled 2019-03-10 (×4): qty 1000

## 2019-03-10 MED ORDER — FLEET ENEMA 7-19 GM/118ML RE ENEM: 1.0000 | ENEMA | Freq: Once | RECTAL | Status: DC | PRN

## 2019-03-10 MED ORDER — SODIUM CHLORIDE 0.9 % IV SOLN
INTRAVENOUS | Status: DC | PRN
  Administered 2019-03-10: 25 ug/min via INTRAVENOUS

## 2019-03-10 MED ORDER — METHOCARBAMOL 500 MG PO TABS: 500.0000 mg | ORAL_TABLET | Freq: Four times a day (QID) | ORAL | Status: DC | PRN

## 2019-03-10 MED ORDER — MORPHINE SULFATE (PF) 2 MG/ML IV SOLN: 1.0000 mg | INTRAVENOUS | Status: DC | PRN

## 2019-03-10 MED ORDER — ATORVASTATIN CALCIUM 10 MG PO TABS
10.0000 mg | ORAL_TABLET | ORAL | Status: DC
  Administered 2019-03-11: 10 mg via ORAL
  Filled 2019-03-10: qty 1

## 2019-03-10 MED ORDER — ACETAMINOPHEN 500 MG PO TABS
1000.0000 mg | ORAL_TABLET | Freq: Once | ORAL | Status: AC
  Administered 2019-03-10: 1000 mg via ORAL
  Filled 2019-03-10: qty 2

## 2019-03-10 MED ORDER — PROPOFOL 10 MG/ML IV BOLUS
INTRAVENOUS | Status: DC | PRN
  Administered 2019-03-10: 60 mg via INTRAVENOUS

## 2019-03-10 MED ORDER — SENNA 8.6 MG PO TABS
1.0000 | ORAL_TABLET | Freq: Two times a day (BID) | ORAL | Status: DC
  Administered 2019-03-10 – 2019-03-11 (×2): 8.6 mg via ORAL
  Filled 2019-03-10 (×3): qty 1

## 2019-03-10 MED ORDER — SUCCINYLCHOLINE CHLORIDE 200 MG/10ML IV SOSY
PREFILLED_SYRINGE | INTRAVENOUS | Status: DC | PRN
  Administered 2019-03-10: 80 mg via INTRAVENOUS

## 2019-03-10 MED ORDER — ONDANSETRON HCL 4 MG/2ML IJ SOLN: 4.0000 mg | Freq: Four times a day (QID) | INTRAMUSCULAR | Status: DC | PRN

## 2019-03-10 MED ORDER — ZOLPIDEM TARTRATE 5 MG PO TABS: 5.0000 mg | ORAL_TABLET | Freq: Every evening | ORAL | Status: DC | PRN

## 2019-03-10 MED ORDER — SUGAMMADEX SODIUM 200 MG/2ML IV SOLN
INTRAVENOUS | Status: DC | PRN
  Administered 2019-03-10: 126 mg via INTRAVENOUS

## 2019-03-10 MED ORDER — DIPHENHYDRAMINE HCL 12.5 MG/5ML PO ELIX: 12.5000 mg | ORAL_SOLUTION | ORAL | Status: DC | PRN

## 2019-03-10 MED ORDER — DOCUSATE SODIUM 100 MG PO CAPS
100.0000 mg | ORAL_CAPSULE | Freq: Two times a day (BID) | ORAL | Status: DC
  Administered 2019-03-10 – 2019-03-11 (×2): 100 mg via ORAL
  Filled 2019-03-10 (×3): qty 1

## 2019-03-10 MED ORDER — MIDAZOLAM HCL 2 MG/2ML IJ SOLN
INTRAMUSCULAR | Status: AC
  Filled 2019-03-10: qty 2

## 2019-03-10 MED ORDER — MIDAZOLAM HCL 2 MG/2ML IJ SOLN
INTRAMUSCULAR | Status: AC
  Administered 2019-03-10: 1 mg
  Filled 2019-03-10: qty 2

## 2019-03-10 MED ORDER — ONDANSETRON HCL 4 MG/2ML IJ SOLN
INTRAMUSCULAR | Status: DC | PRN
  Administered 2019-03-10: 4 mg via INTRAVENOUS

## 2019-03-10 MED ORDER — ROCURONIUM BROMIDE 50 MG/5ML IV SOSY
PREFILLED_SYRINGE | INTRAVENOUS | Status: AC
  Filled 2019-03-10: qty 5

## 2019-03-10 MED ORDER — GLYCOPYRROLATE 0.2 MG/ML IJ SOLN
INTRAMUSCULAR | Status: DC | PRN
  Administered 2019-03-10: 0.1 mg via INTRAVENOUS

## 2019-03-10 MED ORDER — ACETAMINOPHEN 500 MG PO TABS
500.0000 mg | ORAL_TABLET | Freq: Four times a day (QID) | ORAL | Status: AC
  Administered 2019-03-10 – 2019-03-11 (×4): 500 mg via ORAL
  Filled 2019-03-10 (×4): qty 1

## 2019-03-10 MED ORDER — METHOCARBAMOL 1000 MG/10ML IJ SOLN
500.0000 mg | Freq: Four times a day (QID) | INTRAVENOUS | Status: DC | PRN
  Filled 2019-03-10: qty 5

## 2019-03-10 MED ORDER — MIDAZOLAM HCL 2 MG/2ML IJ SOLN
INTRAMUSCULAR | Status: DC | PRN
  Administered 2019-03-10: 2 mg via INTRAVENOUS

## 2019-03-10 MED ORDER — FENTANYL CITRATE (PF) 100 MCG/2ML IJ SOLN
INTRAMUSCULAR | Status: AC
  Administered 2019-03-10: 50 ug
  Filled 2019-03-10: qty 2

## 2019-03-10 MED ORDER — HYDROCODONE-ACETAMINOPHEN 5-325 MG PO TABS
1.0000 | ORAL_TABLET | ORAL | Status: DC | PRN
  Administered 2019-03-11: 2 via ORAL
  Filled 2019-03-10: qty 2

## 2019-03-10 MED ORDER — EPHEDRINE SULFATE 50 MG/ML IJ SOLN
INTRAMUSCULAR | Status: DC | PRN
  Administered 2019-03-10: 10 mg via INTRAVENOUS

## 2019-03-10 MED ORDER — LACTATED RINGERS IV SOLN
INTRAVENOUS | Status: DC | PRN
  Administered 2019-03-10 (×2): via INTRAVENOUS

## 2019-03-10 MED ORDER — LACTATED RINGERS IV SOLN: INTRAVENOUS | Status: DC

## 2019-03-10 MED ORDER — BUPIVACAINE HCL (PF) 0.25 % IJ SOLN
INTRAMUSCULAR | Status: AC
  Filled 2019-03-10: qty 30

## 2019-03-10 MED ORDER — FENTANYL CITRATE (PF) 250 MCG/5ML IJ SOLN
INTRAMUSCULAR | Status: AC
  Filled 2019-03-10: qty 5

## 2019-03-10 MED ORDER — CHLORHEXIDINE GLUCONATE 4 % EX LIQD: 60.0000 mL | Freq: Once | CUTANEOUS | Status: DC

## 2019-03-10 MED ORDER — ONDANSETRON HCL 4 MG PO TABS: 4.0000 mg | ORAL_TABLET | Freq: Four times a day (QID) | ORAL | Status: DC | PRN

## 2019-03-10 MED ORDER — GLYCOPYRROLATE PF 0.2 MG/ML IJ SOSY
PREFILLED_SYRINGE | INTRAMUSCULAR | Status: AC
  Filled 2019-03-10: qty 1

## 2019-03-10 MED ORDER — 0.9 % SODIUM CHLORIDE (POUR BTL) OPTIME
TOPICAL | Status: DC | PRN
  Administered 2019-03-10: 1000 mL

## 2019-03-10 MED ORDER — PHENYLEPHRINE 40 MCG/ML (10ML) SYRINGE FOR IV PUSH (FOR BLOOD PRESSURE SUPPORT)
PREFILLED_SYRINGE | INTRAVENOUS | Status: AC
  Filled 2019-03-10: qty 10

## 2019-03-10 MED ORDER — MORPHINE SULFATE (PF) 2 MG/ML IV SOLN: 0.5000 mg | INTRAVENOUS | Status: DC | PRN

## 2019-03-10 MED ORDER — ADULT MULTIVITAMIN W/MINERALS CH
ORAL_TABLET | Freq: Every day | ORAL | Status: DC
  Administered 2019-03-10 – 2019-03-11 (×2): 1 via ORAL
  Filled 2019-03-10 (×2): qty 1

## 2019-03-10 MED ORDER — ACETAMINOPHEN 325 MG PO TABS: 325.0000 mg | ORAL_TABLET | Freq: Four times a day (QID) | ORAL | Status: DC | PRN

## 2019-03-10 MED ORDER — BUPIVACAINE HCL (PF) 0.25 % IJ SOLN
INTRAMUSCULAR | Status: DC | PRN
  Administered 2019-03-10: 1 mL

## 2019-03-10 MED ORDER — PROMETHAZINE HCL 25 MG/ML IJ SOLN: 6.2500 mg | INTRAMUSCULAR | Status: DC | PRN

## 2019-03-10 MED ORDER — PHENYLEPHRINE HCL (PRESSORS) 10 MG/ML IV SOLN
INTRAVENOUS | Status: DC | PRN
  Administered 2019-03-10 (×2): 120 ug via INTRAVENOUS

## 2019-03-10 MED ORDER — SUCCINYLCHOLINE CHLORIDE 200 MG/10ML IV SOSY
PREFILLED_SYRINGE | INTRAVENOUS | Status: AC
  Filled 2019-03-10: qty 10

## 2019-03-10 MED ORDER — SENNOSIDES-DOCUSATE SODIUM 8.6-50 MG PO TABS: 1.0000 | ORAL_TABLET | Freq: Every evening | ORAL | Status: DC | PRN

## 2019-03-10 MED ORDER — HYDROMORPHONE HCL 1 MG/ML IJ SOLN: 0.2500 mg | INTRAMUSCULAR | Status: DC | PRN

## 2019-03-10 MED ORDER — CEFAZOLIN SODIUM-DEXTROSE 2-4 GM/100ML-% IV SOLN
INTRAVENOUS | Status: AC
  Filled 2019-03-10: qty 100

## 2019-03-10 MED ORDER — ARTIFICIAL TEARS OPHTHALMIC OINT
TOPICAL_OINTMENT | OPHTHALMIC | Status: AC
  Filled 2019-03-10: qty 3.5

## 2019-03-10 MED ORDER — ROCURONIUM BROMIDE 50 MG/5ML IV SOSY
PREFILLED_SYRINGE | INTRAVENOUS | Status: DC | PRN
  Administered 2019-03-10: 50 mg via INTRAVENOUS

## 2019-03-10 MED ORDER — FENTANYL CITRATE (PF) 250 MCG/5ML IJ SOLN
INTRAMUSCULAR | Status: DC | PRN
  Administered 2019-03-10: 50 ug via INTRAVENOUS

## 2019-03-10 MED ORDER — LORAZEPAM 0.5 MG PO TABS
0.5000 mg | ORAL_TABLET | Freq: Two times a day (BID) | ORAL | Status: DC | PRN
  Administered 2019-03-11: 0.5 mg via ORAL
  Filled 2019-03-10: qty 1

## 2019-03-10 MED ORDER — ONDANSETRON HCL 4 MG/2ML IJ SOLN
INTRAMUSCULAR | Status: AC
  Filled 2019-03-10: qty 2

## 2019-03-10 MED ORDER — BISACODYL 10 MG RE SUPP: 10.0000 mg | Freq: Every day | RECTAL | Status: DC | PRN

## 2019-03-10 MED ORDER — ROPIVACAINE HCL 5 MG/ML IJ SOLN
INTRAMUSCULAR | Status: DC | PRN
  Administered 2019-03-10: 25 mL via PERINEURAL

## 2019-03-10 SURGICAL SUPPLY — 64 items
BANDAGE ESMARK 6X9 LF (GAUZE/BANDAGES/DRESSINGS) IMPLANT
BIT DRILL 110X2.5XQCK CNCT (BIT) ×1 IMPLANT
BIT DRILL 2.5 (BIT) ×2
BIT DRILL 2.7 (BIT) ×1
BIT DRILL 2.7MM (BIT) ×1 IMPLANT
BIT DRL 110X2.5XQCK CNCT (BIT) ×1
BNDG CMPR 9X6 STRL LF SNTH (GAUZE/BANDAGES/DRESSINGS) ×1
BNDG COHESIVE 4X5 TAN STRL (GAUZE/BANDAGES/DRESSINGS) ×1 IMPLANT
BNDG ESMARK 6X9 LF (GAUZE/BANDAGES/DRESSINGS) ×2
BOOTCOVER CLEANROOM LRG (PROTECTIVE WEAR) ×4 IMPLANT
CANISTER SUCT 3000ML PPV (MISCELLANEOUS) ×2 IMPLANT
CANISTER WOUND CARE 500ML ATS (WOUND CARE) ×1 IMPLANT
CLSR STERI-STRIP ANTIMIC 1/2X4 (GAUZE/BANDAGES/DRESSINGS) ×2 IMPLANT
COVER SURGICAL LIGHT HANDLE (MISCELLANEOUS) ×2 IMPLANT
COVER WAND RF STERILE (DRAPES) ×2 IMPLANT
DRAPE C-ARM 42X72 X-RAY (DRAPES) ×2 IMPLANT
DRAPE C-ARMOR (DRAPES) ×1 IMPLANT
DRAPE INCISE IOBAN 66X45 STRL (DRAPES) ×2 IMPLANT
DRAPE U-SHAPE 47X51 STRL (DRAPES) ×1 IMPLANT
DRESSING PREVENA PLUS CUSTOM (GAUZE/BANDAGES/DRESSINGS) IMPLANT
DRILL BIT 2.7MM (BIT) ×2
DRSG PREVENA PLUS CUSTOM (GAUZE/BANDAGES/DRESSINGS) ×2
ELECT CAUTERY BLADE 6.4 (BLADE) ×1 IMPLANT
ELECT REM PT RETURN 9FT ADLT (ELECTROSURGICAL) ×2
ELECTRODE REM PT RTRN 9FT ADLT (ELECTROSURGICAL) ×1 IMPLANT
GLOVE BIOGEL PI ORTHO PRO SZ8 (GLOVE) ×2
GLOVE ORTHO TXT STRL SZ7.5 (GLOVE) ×2 IMPLANT
GLOVE PI ORTHO PRO STRL SZ8 (GLOVE) ×2 IMPLANT
GOWN STRL REUS W/ TWL LRG LVL3 (GOWN DISPOSABLE) ×1 IMPLANT
GOWN STRL REUS W/ TWL XL LVL3 (GOWN DISPOSABLE) ×2 IMPLANT
GOWN STRL REUS W/TWL LRG LVL3 (GOWN DISPOSABLE) ×2
GOWN STRL REUS W/TWL XL LVL3 (GOWN DISPOSABLE) ×4
K-WIRE ACE 1.6X6 (WIRE) ×4
KIT BASIN OR (CUSTOM PROCEDURE TRAY) ×2 IMPLANT
KIT DRSG PREVENA PLUS 7DAY 125 (MISCELLANEOUS) ×1 IMPLANT
KIT TURNOVER KIT B (KITS) ×2 IMPLANT
KWIRE ACE 1.6X6 (WIRE) IMPLANT
MANIFOLD NEPTUNE II (INSTRUMENTS) ×2 IMPLANT
NS IRRIG 1000ML POUR BTL (IV SOLUTION) ×2 IMPLANT
PACK ORTHO EXTREMITY (CUSTOM PROCEDURE TRAY) ×2 IMPLANT
PAD ARMBOARD 7.5X6 YLW CONV (MISCELLANEOUS) ×4 IMPLANT
PLATE FIBULA DISTAL 6H RIGHT (Plate) ×1 IMPLANT
SCREW 3.5X12 (Screw) ×6 IMPLANT
SCREW BN 2.7X12X3.5XST NS (Screw) IMPLANT
SCREW CANN 1/3 THRD RVRS CT (Screw) IMPLANT
SCREW CANN 1/3THD 46X4.0 (Screw) ×1 IMPLANT
SCREW CANN THRD 5/4X36 (Screw) ×1 IMPLANT
SCREW CANNULATED 4.0X40 (Screw) ×4 IMPLANT
SCREW CORTICAL 3.5 18MM (Screw) ×1 IMPLANT
SCREW LOCK 16X3.5X2.7X NS (Screw) IMPLANT
SCREW LOCKING 3.5X16MM (Screw) ×4 IMPLANT
SCREW PERI 3.5X2.7X14 (Screw) ×2 IMPLANT
SUCTION FRAZIER HANDLE 10FR (MISCELLANEOUS) ×1
SUCTION TUBE FRAZIER 10FR DISP (MISCELLANEOUS) ×1 IMPLANT
SUT ETHILON 2 0 PSLX (SUTURE) ×1 IMPLANT
SUT VIC AB 0 CT1 27 (SUTURE) ×4
SUT VIC AB 0 CT1 27XBRD ANBCTR (SUTURE) ×1 IMPLANT
SUT VIC AB 2-0 CT1 27 (SUTURE) ×4
SUT VIC AB 2-0 CT1 TAPERPNT 27 (SUTURE) ×2 IMPLANT
SYR CONTROL 10ML LL (SYRINGE) ×1 IMPLANT
TUBE CONNECTING 12X1/4 (SUCTIONS) ×3 IMPLANT
UNDERPAD 30X30 (UNDERPADS AND DIAPERS) ×2 IMPLANT
WATER STERILE IRR 1000ML POUR (IV SOLUTION) ×2 IMPLANT
YANKAUER SUCT BULB TIP NO VENT (SUCTIONS) ×1 IMPLANT

## 2019-03-10 NOTE — H&P (Signed)
PREOPERATIVE H&P  Chief Complaint: Right ankle pain  HPI: Julie Mann is a 73 y.o. female who presents for preoperative history and physical with a diagnosis of right ankle fracture dislocation.  She was seen in the clinic approximately 2 days ago by Dr. French Ana, attempted closed reduction, postreduction CT scan demonstrated persistent posterior subluxation of the talus with significant displacement.  She has had moderate to severe pain, worsening particularly over the anterior aspect, pain medicines have been unable to control her pain, she does not tolerate anything more than Tylenol, movement makes it worse, rest and elevation is made it better.   Past Medical History:  Diagnosis Date  . 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 Arthritis    "hands, fingers, back" (02/27/2014)  . Atrial fibrillation ([place])   . Cat scratch of right hand 02/24/2014  . Dysrhythmia    a fib  . Fall 01/2018  . Fall from horse 01/2018  . High cholesterol   . History of stomach ulcers ~ 1966  . Osteopenia    Past Surgical History:  Procedure Laterality Date  . ANUS SURGERY  2005   "biopsy"  . BREAST CYST EXCISION  1966  . DILATION AND CURETTAGE OF UTERUS  1980's   S/P miscarriage  . VARICOSE VEIN SURGERY Left    left leg   Social History   Socioeconomic History  . Marital status: Married    Spouse name: Not on file  . Number of children: Not on file  . Years of education: Not on file  . Highest education level: Not on file  Occupational History  . Not on file  Social Needs  . Financial resource strain: Not on file  . Food insecurity:    Worry: Not on file    Inability: Not on file  . Transportation needs:    Medical: Not on file    Non-medical: Not on file  Tobacco Use  . Smoking status: Former Smoker    Packs/day: 1.00    Years: 30.00    Pack years: 30.00    Types: Cigarettes  . Smokeless tobacco: Never Used  Substance and Sexual Activity  . Alcohol  use: No  . Drug use: No  . Sexual activity: Yes    Partners: Male    Birth control/protection: Post-menopausal  Lifestyle  . Physical activity:    Days per week: Not on file    Minutes per session: Not on file  . Stress: Not on file  Relationships  . Social connections:    Talks on phone: Not on file    Gets together: Not on file    Attends religious service: Not on file    Active member of club or organization: Not on file    Attends meetings of clubs or organizations: Not on file    Relationship status: Not on file  Other Topics Concern  . Not on file  Social History Narrative  . Not on file   Family History  Problem Relation Age of Onset  . Breast cancer Maternal Aunt   . Breast cancer Maternal Grandmother   . Heart disease Mother   . Stroke Father   . Rectal cancer Paternal Grandmother   . Breast cancer Sister 35  . Breast cancer Sister 35   Allergies  Allergen Reactions  . Eliquis [Apixaban] Anaphylaxis    Made her feel sick  . Ivp Dye [Iodinated Diagnostic Agents] Swelling  . Doxycycline Other (See Comments)  Dizziness and tingling  . Erythromycin Other (See Comments)    Hallucinations   . Flonase [Fluticasone Propionate]   . Keflex [Cephalexin] Other (See Comments)    Hallucinations.  . Paxil [Paroxetine Hcl] Diarrhea  . Premarin [Conjugated Estrogens]     Felt weird  . Provera [Medroxyprogesterone Acetate]     Patient do not remember reaction  . Xarelto [Rivaroxaban]     Made her feel sick  . Zithromax [Azithromycin] Other (See Comments)    hallucinations  . Zoloft [Sertraline Hcl] Other (See Comments)    sedation   Prior to Admission medications   Medication Sig Start Date End Date Taking? Authorizing Provider  aspirin EC 81 MG tablet Take 1 tablet (81 mg total) by mouth daily. 08/03/17   Belva Crome, MD  atorvastatin (LIPITOR) 10 MG tablet Take 10 mg by mouth 2 (two) times a week. 07/24/18   [provider]  ibuprofen (ADVIL,MOTRIN)  600 MG tablet Take 1 tablet (600 mg total) by mouth every 8 (eight) hours as needed for headache or mild pain. 12/25/18   Megan Salon, MD  LORazepam (ATIVAN) 1 MG tablet TAKE 1/2-1 TABLET BY MOUTH ONCE DAILY AS NEEDED FOR ANXIETY 12/05/18   Megan Salon, MD  metoprolol tartrate (LOPRESSOR) 25 MG tablet Take 1 tablet by mouth daily as needed (palpitations).  02/14/18   [provider]  Multiple Vitamins-Minerals (MULTIVITAMIN PO) Take 1 tablet by mouth daily.    [provider]  OVER THE COUNTER MEDICATION Take 5 mLs by mouth daily. 1 teaspoon of vinegar    [provider]  OVER THE COUNTER MEDICATION daily. Magnesium oil  Rub on top of the body    [provider]  OVER THE COUNTER MEDICATION Liquid Acidophilus - Take one (1) tablespoon by mouth two (2) to three (3) times daily after meals.    [provider]     Positive ROS: All other systems have been reviewed and were otherwise negative with the exception of those mentioned in the HPI and as above.  Physical Exam: General: Alert, no acute distress Cardiovascular: No pedal edema Respiratory: No cyanosis, no use of accessory musculature GI: No organomegaly, abdomen is soft and non-tender Skin: No lesions in the area of chief complaint Neurologic: Sensation intact distally Psychiatric: Patient is competent for consent with normal mood and affect Lymphatic: No axillary or cervical lymphadenopathy  MUSCULOSKELETAL: Right ankle has significant soft tissue swelling although it does not look like there is any skin tenting.  Sensation is intact throughout the foot.  EHL is intact.  She has good capillary refill.  Assessment: Right ankle distal tibia plafond fracture involving about 50% of the distal tibia, with medial and lateral fractures coexisting.   Plan: Plan for Procedure(s): EXTERNAL FIXATION RIGHT ANKLE VS. OPEN REDUCTION INTERNAL FIXATION RIGHT ANKLE  This is an acute severe injury, and  we will plan for ORIF as long as the soft tissues are amenable intraoperatively.  Dr. Meridee Score is also going to be involved in her care, her primary orthopedist is Dr. Durward Fortes, and additionally she requires foot and ankle subspecialty care.  This is a limb threatening injury, and we are planning to move urgently with surgical intervention.  The risks benefits and alternatives were discussed with the patient including but not limited to the risks of nonoperative treatment, versus surgical intervention including infection, bleeding, nerve injury, malunion, nonunion, the need for revision surgery, hardware prominence, hardware failure, the need for hardware removal,  blood clots, cardiopulmonary complications, morbidity, mortality, among others, and they were willing to proceed.       Julie Bridge, MD Cell [phone]   03/10/2019 9:01 AM

## 2019-03-10 NOTE — Anesthesia Postprocedure Evaluation (Signed)
Anesthesia Post Note  Patient: Jamiah Homeyer [facility]  Procedure(s) Performed: OPEN REDUCTION INTERNAL FIXATION RIGHT ANKLE (Right Ankle)     Patient location during evaluation: PACU Anesthesia Type: General Level of consciousness: awake and alert Pain management: pain level controlled Vital Signs Assessment: post-procedure vital signs reviewed and stable Respiratory status: spontaneous breathing, nonlabored ventilation, respiratory function stable and patient connected to nasal cannula oxygen Cardiovascular status: blood pressure returned to baseline and stable Postop Assessment: no apparent nausea or vomiting Anesthetic complications: no    Last Vitals:  Vitals:   03/10/19 1042 03/10/19 1100  BP: 122/60 135/61  Pulse: 100 90  Resp: 16 15  Temp: (!) 36.1 C   SpO2: 100% 96%    Last Pain:  Vitals:   03/10/19 1100  PainSc: 0-No pain                 Alashia Brownfield S

## 2019-03-10 NOTE — Anesthesia Procedure Notes (Signed)
Anesthesia Procedure Image    

## 2019-03-10 NOTE — Anesthesia Procedure Notes (Signed)
Procedure Name: Intubation Date/Time: 03/10/2019 9:27 AM Performed by: Kathryne Hitch, CRNA Pre-anesthesia Checklist: Patient identified, Emergency Drugs available, Suction available, Patient being monitored and Timeout performed Patient Re-evaluated:Patient Re-evaluated prior to induction Oxygen Delivery Method: Circle system utilized Preoxygenation: Pre-oxygenation with 100% oxygen Induction Type: IV induction Laryngoscope Size: Miller and 2 Grade View: Grade I Tube type: Oral Number of attempts: 1 Airway Equipment and Method: Stylet Placement Confirmation: ETT inserted through vocal cords under direct vision,  positive ETCO2 and breath sounds checked- equal and bilateral Secured at: 22 cm Tube secured with: Tape Dental Injury: Teeth and Oropharynx as per pre-operative assessment

## 2019-03-10 NOTE — Anesthesia Preprocedure Evaluation (Addendum)
Anesthesia Evaluation  Patient identified by MRN, date of birth, ID band Patient awake    Reviewed: Allergy & Precautions, NPO status , Patient's Chart, lab work & pertinent test results  Airway Mallampati: II  TM Distance: >3 FB Neck ROM: Full    Dental no notable dental hx.    Pulmonary neg pulmonary ROS, former smoker,    Pulmonary exam normal breath sounds clear to auscultation       Cardiovascular negative cardio ROS Normal cardiovascular exam Rhythm:Regular Rate:Normal     Neuro/Psych negative neurological ROS  negative psych ROS   GI/Hepatic negative GI ROS, Neg liver ROS,   Endo/Other  negative endocrine ROS  Renal/GU negative Renal ROS  negative genitourinary   Musculoskeletal negative musculoskeletal ROS (+)   Abdominal   Peds negative pediatric ROS (+)  Hematology negative hematology ROS (+)   Anesthesia Other Findings   Reproductive/Obstetrics negative OB ROS                             Anesthesia Physical Anesthesia Plan  ASA: II and emergent  Anesthesia Plan: General   Post-op Pain Management:  Regional for Post-op pain   Induction: Intravenous  PONV Risk Score and Plan: Ondansetron, Dexamethasone and Treatment may vary due to age or medical condition  Airway Management Planned: LMA and Oral ETT  Additional Equipment:   Intra-op Plan:   Post-operative Plan: Extubation in OR  Informed Consent: I have reviewed the patients History and Physical, chart, labs and discussed the procedure including the risks, benefits and alternatives for the proposed anesthesia with the patient or authorized representative who has indicated his/her understanding and acceptance.     Dental advisory given  Plan Discussed with: CRNA and Surgeon  Anesthesia Plan Comments:         Anesthesia Quick Evaluation

## 2019-03-10 NOTE — Op Note (Signed)
03/10/2019  10:40 AM  PATIENT:  Julie Mann    PRE-OPERATIVE DIAGNOSIS: Trimalleolar right ankle fracture  POST-OPERATIVE DIAGNOSIS:  Same  PROCEDURE:  OPEN REDUCTION INTERNAL FIXATION RIGHT ANKLE With internal fixation of the fibula posterior malleolus and medial malleolus. C arm fluoroscopy  Application of Praveena customizable wound VAC.  SURGEON:  Newt Minion, MD  PHYSICIAN ASSISTANT: Dr. Marchia Bond ANESTHESIA:   General  PREOPERATIVE INDICATIONS:  Julie Mann is a  73 y.o. female with a diagnosis of right ankle fracture who failed conservative measures and elected for surgical management.    The risks benefits and alternatives were discussed with the patient preoperatively including but not limited to the risks of infection, bleeding, nerve injury, cardiopulmonary complications, the need for revision surgery, among others, and the patient was willing to proceed.  OPERATIVE IMPLANTS: Closed  @ENCIMAGES @  OPERATIVE FINDINGS: Patient had very thin osteoporotic bone.  Postreduction radiographs shows reconstruction of the mortise with a congruent tibial plafond  OPERATIVE PROCEDURE: Patient was brought the operating room underwent a general anesthetic.  After adequate levels anesthesia were obtained patient's right lower extremity was prepped using ChloraPrep draped into a sterile field a timeout was called.  Patient had already started developing fracture blisters medially and had bruising and early skin ischemic changes dorsally over the ankle as well as over the fibula.  Due to the ischemic changes and blistering medially was elected to proceed with percutaneous screw fixation both medially and posteriorly.  A incision was first made over the fibula this was carried sharply down to bone.  The fracture edges were freshened irrigated with normal saline.  The fibula was pulled out to length and a lag screw was placed 18 mm.  The contoured anatomic fibular plate was then  placed with 4 locking screws distally and the proximal screws were filled with compression screws x3.  C-arm fluoroscopy verified restoration of alignment of the fibula.  Attention was then focused on the medial malleolus.  2 percutaneous incisions were made and a cannulated 4.0 partially-threaded screw was used x2 to stabilize the medial malleolus.  Attention was then focused on the posterior malleolus.  Under visualization with a C arm 2 guidewires were placed posterior to anteriorly just off the medial aspect of the Achilles.  The tissue protector was used and this was held adjacent to bone to prevent prevent any injury to tendon or neurovascular bundle.  2 screws were then placed posterior to anteriorly and this showed good stable alignment.  All wounds were irrigated with normal saline.  Patient's skin was extremely thin and tenuous.  Using a far near near far suture technique with 2-0 nylon the incision was closed.  The Praveena customizable wound VAC was then cut into strips and was used to circumferentially cover the ankle from the medial to lateral aspect to cover all of the injured skin.  This had a good suction fit this was overwrapped with Covan.  Patient was extubated taken the PACU in stable condition.   DISCHARGE PLANNING:  Antibiotic duration: Antibiotics for 24 hours postoperatively.  Weightbearing: Nonweightbearing on the right using a fracture boot for ambulation.  Pain medication: Low-dose pain medication  Dressing care/ Wound VAC: Continue wound VAC for 1 week at discharge with a Praveena portable pump  Ambulatory devices: Walker  Discharge to: Anticipate discharge to home when stable with therapy.  Follow-up: In the office 1 week post operative.

## 2019-03-10 NOTE — Transfer of Care (Signed)
Immediate Anesthesia Transfer of Care Note  Patient: Julie Mann [facility]  Procedure(s) Performed: OPEN REDUCTION INTERNAL FIXATION RIGHT ANKLE (Right Ankle)  Patient Location: PACU  Anesthesia Type:General  Level of Consciousness: awake, alert  and patient cooperative  Airway & Oxygen Therapy: Patient Spontanous Breathing and Patient connected to face mask oxygen  Post-op Assessment: Report given to RN and Post -op Vital signs reviewed and stable  Post vital signs: Reviewed and stable  Last Vitals:  Vitals Value Taken Time  BP 84/73 03/10/2019 10:42 AM  Temp    Pulse 106 03/10/2019 10:43 AM  Resp 22 03/10/2019 10:43 AM  SpO2 100 % 03/10/2019 10:43 AM  Vitals shown include unvalidated device data.  Last Pain: There were no vitals filed for this visit.       Complications: No apparent anesthesia complications

## 2019-03-10 NOTE — Anesthesia Procedure Notes (Signed)
Anesthesia Regional Block: Popliteal block   Pre-Anesthetic Checklist: ,, timeout performed, Correct Patient, Correct Site, Correct Laterality, Correct Procedure, Correct Position, site marked, Risks and benefits discussed,  Surgical consent,  Pre-op evaluation,  At surgeon's request and post-op pain management  Laterality: Right  Prep: chloraprep       Needles:  Injection technique: Single-shot  Needle Type: Echogenic Needle     Needle Length: 9cm      Additional Needles:   Procedures:,,,, ultrasound used (permanent image in chart),,,,  Narrative:  Start time: 03/10/2019 8:30 AM End time: 03/10/2019 8:39 AM Injection made incrementally with aspirations every 5 mL.  Performed by: Personally  Anesthesiologist: Myrtie Soman, MD  Additional Notes: Patient tolerated the procedure well without complications

## 2019-03-10 NOTE — Progress Notes (Signed)
Orthopedic Tech Progress Note Patient Details:  Julie Mann [facility] January 03, 1946 [id number]  Ortho Devices Type of Ortho Device: CAM walker Ortho Device/Splint Location: LRE Ortho Device/Splint Interventions: Adjustment, Application, Ordered   Post Interventions Patient Tolerated: Well Instructions Provided: Care of device, Adjustment of device   Janit Pagan 03/10/2019, 12:44 PM

## 2019-03-11 ENCOUNTER — Encounter (HOSPITAL_COMMUNITY): Payer: Self-pay | Admitting: Orthopedic Surgery

## 2019-03-11 MED ORDER — JUVEN PO PACK
1.0000 | PACK | Freq: Two times a day (BID) | ORAL | Status: DC
  Administered 2019-03-11: 16:00:00 1 via ORAL
  Filled 2019-03-11 (×3): qty 1

## 2019-03-11 MED ORDER — METOPROLOL TARTRATE 25 MG PO TABS: 25.0000 mg | ORAL_TABLET | Freq: Every day | ORAL | Status: DC | PRN

## 2019-03-11 NOTE — Progress Notes (Signed)
Patient ID: Julie Mann, female   DOB: July 27, 1946, 73 y.o.   MRN: [id number] Patient is postoperative day 1 status post open reduction internal fixation right ankle for posterior malleolar fracture medial malleolar fracture and fibular fracture.  Patient has no complaints this morning she can wiggle her toes.  There is 40 cc in the wound VAC canister there is 1 check.  Patient does have a kneeling scooter at home.  She is to be nonweightbearing for the right lower extremity she may discharge when she is safe with therapy.  She is to wear the fracture boot at all times when she is up ambulating nonweightbearing.

## 2019-03-11 NOTE — Progress Notes (Signed)
PT Cancellation Note  Patient Details Name: Julie Mann MRN: [id number] DOB: 03/16/1946   Cancelled Treatment:    Reason Eval/Treat Not Completed: Patient declined, no reason specified - Pt reports sudden symptoms of trembling, feeling hot. Pt states she does not feel well, would like PT to check back.  [name], PT Acute Rehabilitation Services Pager [phone]  Office [phone]    [name] 03/11/2019, 11:44 AM

## 2019-03-11 NOTE — Progress Notes (Signed)
Initial Nutrition Assessment  RD working remotely.  DOCUMENTATION CODES:   Not applicable  INTERVENTION:   -Continue MVI with minerals daily -1 packet Juven BID, each packet provides 90 calories, 2.5 grams of protein, 8 grams of carbohydrate, and 14 grams of amino acids; supplement contains CaHMB, glutamine, and arginine, to promote wound healing  NUTRITION DIAGNOSIS:   Increased nutrient needs related to post-op healing as evidenced by estimated needs.  GOAL:   Patient will meet greater than or equal to 90% of their needs  MONITOR:   PO intake, Supplement acceptance, Labs, Weight trends, Skin, I & O's  REASON FOR ASSESSMENT:   Other (Comment)    ASSESSMENT:   Julie Mann is a 73 y.o. female who presents for preoperative history and physical with a diagnosis of right ankle fracture dislocation.  She was seen in the clinic approximately 2 days ago by Dr. French Ana, attempted closed reduction, postreduction CT scan demonstrated persistent posterior subluxation of the talus with significant displacement.  Pt admitted with rt ankle fracture dislocation.   426- s/p ORIF rt ankle with internal fixation of the fibula posterior malleolus and medical malleolus, C arm fluoroscopy, and wound vac placement  Reviewed I/O's: +1.4 L x 24 hours and +1.4 L since admission  UOP: 1.1 L x 24 hours  Per RN notes, pt complaining of feeling hot with dry mouth and shakiness. RN continuing to monitor BP.  Pt with good appetite; noted meal completion 100%.   Reviewed wt hx; wt has been stable over the past several years. UBW ranges from 128-132#.   Labs reviewed.   NUTRITION - FOCUSED PHYSICAL EXAM:    Most Recent Value  Orbital Region  Unable to assess  Upper Arm Region  Unable to assess  Thoracic and Lumbar Region  Unable to assess  Buccal Region  Unable to assess  Temple Region  Unable to assess  Clavicle Bone Region  Unable to assess  Clavicle and Acromion Bone Region  Unable to  assess  Scapular Bone Region  Unable to assess  Dorsal Hand  Unable to assess  Patellar Region  Unable to assess  Anterior Thigh Region  Unable to assess  Posterior Calf Region  Unable to assess  Edema (RD Assessment)  Unable to assess  Hair  Unable to assess  Eyes  Unable to assess  Mouth  Unable to assess  Skin  Unable to assess  Nails  Unable to assess       Diet Order:   Diet Order            Diet regular Room service appropriate? Yes; Fluid consistency: Thin  Diet effective now              EDUCATION NEEDS:   No education needs have been identified at this time  Skin:  Skin Assessment: Skin Integrity Issues: Skin Integrity Issues:: Wound VAC Wound Vac: rt ankle  Last BM:  03/09/19  Height:   Ht Readings from Last 1 Encounters:  02/19/19 5' 6.5" (1.689 m)    Weight:   Wt Readings from Last 1 Encounters:  03/10/19 63 kg    Ideal Body Weight:  60.2 kg  BMI:  Body mass index is 22.08 kg/m.  Estimated Nutritional Needs:   Kcal:  1800-2000  Protein:  95-110 grams  Fluid:  > 1.8 L    Custer Pimenta A. Jimmye Norman, RD, LDN, [place] Registered Dietitian II Certified Diabetes Care and Education Specialist Pager: [phone] After hours Pager: [phone]

## 2019-03-11 NOTE — Evaluation (Signed)
Physical Therapy Evaluation Patient Details Name: Julie Mann MRN: [id number] DOB: 1946-10-14 Today's Date: 03/11/2019   History of Present Illness  73 yo female admitted for R ankle fracture suffered at home, pt describes torquing her ankle on her bottom step. Closed reduction attempted 4/24, without success. Pt is s/p ORIF R ankle with internal fixation of the fibula posterior malleolus and medial malleolus on 03/10/19. PMH includes anal cancer, OA, afib, osteopenia.   Clinical Impression   Pt presents with mild to moderate R ankle pain, difficulty performing mobility tasks s/p ORIF, decreased knowledge of precautions, and decreased activity tolerance post-surgically. Pt to benefit from acute PT to address deficits. Pt ambulated 5 ft with RW with hop-to gait, and pt maintained NWB status with cam walker boot well. Pt also used knee scooter proficiently, as pt has one at home that she will be using in her house. Pt mainly limited by pain and fatigue at this time. PT recommending HHPT to continue to address pt deficits in the home environment, suspect pt will progress well with PT. PT to progress mobility as tolerated, and will continue to follow acutely.      Follow Up Recommendations Home health PT;Supervision for mobility/OOB    Equipment Recommendations  3in1 (PT);Rolling walker with 5" wheels    Recommendations for Other Services       Precautions / Restrictions Precautions Precautions: Fall Required Braces or Orthoses: Other Brace Other Brace: Cam walker boot on when up and ambulating Restrictions Weight Bearing Restrictions: Yes RLE Weight Bearing: Non weight bearing      Mobility  Bed Mobility Overal bed mobility: Needs Assistance Bed Mobility: Supine to Sit     Supine to sit: Min assist;HOB elevated     General bed mobility comments: Min assist for RLE lifting and translation to EOB, increased time and use of bed rails to come to EOB.   Transfers Overall transfer  level: Needs assistance Equipment used: Rolling walker (2 wheeled) Transfers: Sit to/from Stand Sit to Stand: Min assist;From elevated surface         General transfer comment: Min assist for steadying upon standing, VC to reinforce NWB on RLE and hand placement upon rising.   Ambulation/Gait Ambulation/Gait assistance: Min guard Gait Distance (Feet): 5 Feet Assistive device: Rolling walker (2 wheeled) Gait Pattern/deviations: Step-to pattern;Decreased step length - left;Trunk flexed Gait velocity: decr    General Gait Details: Min guard for safety. Pt performed hop-to gait, maintained NWB well with cam walker boot applied. Pt also used knee scooter for 75 ft with supervision for safety.   Stairs            Wheelchair Mobility    Modified Rankin (Stroke Patients Only)       Balance Overall balance assessment: Needs assistance;History of Falls Sitting-balance support: No upper extremity supported;Feet supported Sitting balance-Leahy Scale: Good     Standing balance support: Bilateral upper extremity supported Standing balance-Leahy Scale: Poor Standing balance comment: reliant on UE support in standing                              Pertinent Vitals/Pain Pain Assessment: 0-10 Pain Score: 4  Pain Location: R ankle Pain Descriptors / Indicators: Sore Pain Intervention(s): Monitored during session;Repositioned;Limited activity within patient's tolerance    Home Living Family/patient expects to be discharged to:: Private residence Living Arrangements: Spouse/significant other Available Help at Discharge: Family Type of Home: House Home Access: Stairs to  enter Entrance Stairs-Rails: None Entrance Stairs-Number of Steps: 1+1 Home Layout: Two level;Able to live on main level with bedroom/bathroom Home Equipment: Other (comment) Additional Comments: knee scooter     Prior Function Level of Independence: Independent;Independent with assistive device(s)          Comments: Prior to ankle injury, pt was completely independent and enjoyed walking daily, horseback riding. After ankle injury, pt has been using knee scooter for mobility.      Hand Dominance   Dominant Hand: Right    Extremity/Trunk Assessment   Upper Extremity Assessment Upper Extremity Assessment: Overall WFL for tasks assessed    Lower Extremity Assessment Lower Extremity Assessment: Overall WFL for tasks assessed;RLE deficits/detail RLE Deficits / Details: able to wiggle toes, perform quad set, and perform assisted SLR during bed mobility.  RLE: Unable to fully assess due to immobilization    Cervical / Trunk Assessment Cervical / Trunk Assessment: Normal  Communication   Communication: No difficulties  Cognition Arousal/Alertness: Awake/alert Behavior During Therapy: WFL for tasks assessed/performed Overall Cognitive Status: Within Functional Limits for tasks assessed                                 General Comments: pt very energetic, eager to mobilize.       General Comments      Exercises     Assessment/Plan    PT Assessment Patient needs continued PT services  PT Problem List Decreased strength;Decreased mobility;Decreased safety awareness;Decreased knowledge of precautions;Decreased activity tolerance;Decreased balance;Decreased knowledge of use of DME;Pain       PT Treatment Interventions DME instruction;Functional mobility training;Balance training;Patient/family education;Gait training;Therapeutic activities;Therapeutic exercise;Stair training    PT Goals (Current goals can be found in the Care Plan section)  Acute Rehab PT Goals Patient Stated Goal: go home with husband PT Goal Formulation: With patient Time For Goal Achievement: 03/25/19 Potential to Achieve Goals: Good    Frequency Min 5X/week   Barriers to discharge        Co-evaluation               AM-PAC PT "6 Clicks" Mobility  Outcome Measure Help  needed turning from your back to your side while in a flat bed without using bedrails?: A Little Help needed moving from lying on your back to sitting on the side of a flat bed without using bedrails?: A Little Help needed moving to and from a bed to a chair (including a wheelchair)?: A Little Help needed standing up from a chair using your arms (e.g., wheelchair or bedside chair)?: A Little Help needed to walk in hospital room?: A Little Help needed climbing 3-5 steps with a railing? : A Lot 6 Click Score: 17    End of Session Equipment Utilized During Treatment: Gait belt;Other (comment)(cam walker boot ) Activity Tolerance: Patient tolerated treatment well;Patient limited by fatigue Patient left: in chair;with chair alarm set;with call bell/phone within reach Nurse Communication: Mobility status PT Visit Diagnosis: Other abnormalities of gait and mobility (R26.89);Unsteadiness on feet (R26.81);Difficulty in walking, not elsewhere classified (R26.2)    Time: 4196-2229 PT Time Calculation (min) (ACUTE ONLY): 40 min   Charges:   PT Evaluation $PT Eval Low Complexity: 1 Low PT Treatments $Gait Training: 23-37 mins       [name], PT Acute Rehabilitation Services Pager [phone]  Office [phone]  Clora Ohmer D Elonda Husky 03/11/2019, 4:27 PM

## 2019-03-11 NOTE — Plan of Care (Signed)
  Problem: Clinical Measurements: Goal: Postoperative complications will be avoided or minimized Outcome: Progressing   Problem: Skin Integrity: Goal: Demonstration of wound healing without infection will improve Outcome: Progressing   Problem: Education: Goal: Knowledge of General Education information will improve Description Including pain rating scale, medication(s)/side effects and non-pharmacologic comfort measures Outcome: Progressing   Problem: Health Behavior/Discharge Planning: Goal: Ability to manage health-related needs will improve Outcome: Progressing   Problem: Clinical Measurements: Goal: Ability to maintain clinical measurements within normal limits will improve Outcome: Progressing Goal: Will remain free from infection Outcome: Progressing   Problem: Activity: Goal: Risk for activity intolerance will decrease Outcome: Progressing   Problem: Nutrition: Goal: Adequate nutrition will be maintained Outcome: Progressing   Problem: Coping: Goal: Level of anxiety will decrease Outcome: Progressing   Problem: Elimination: Goal: Will not experience complications related to urinary retention Outcome: Progressing   Problem: Pain Managment: Goal: General experience of comfort will improve Outcome: Progressing   Problem: Safety: Goal: Ability to remain free from injury will improve Outcome: Progressing   Problem: Skin Integrity: Goal: Risk for impaired skin integrity will decrease Outcome: Progressing

## 2019-03-11 NOTE — Progress Notes (Signed)
At approximately 11:25, pt called this RN into room stating she was very hot, had a dry mouth and was shaky.  Pt also stated her foot was beginning to hurt.  VS stable. Pain meds given.  MD notified and ordered to give pt's PRN dose of Metoprolol and PRN for SBP > 160.  Metoprolol given and will continue to monitor.  Julie Mann

## 2019-03-12 MED ORDER — HYDROCODONE-ACETAMINOPHEN 5-325 MG PO TABS: 1.0000 | ORAL_TABLET | Freq: Four times a day (QID) | ORAL | 0 refills | Status: DC | PRN

## 2019-03-12 NOTE — Discharge Instructions (Signed)
Non weight bearing on the right leg. Elevate the right leg higher than the level of your heart as much as possible Keep the [facility] machine plugged into a wall outlet to keep the machine charged.  Follow-up in the office next week.

## 2019-03-12 NOTE — TOC Transition Note (Signed)
Transition of Care [facility]) - CM/SW Discharge Note   Patient Details  Name: Julie Mann MRN: [id number] Date of Birth: 1946/06/29  [facility] CM/SW Contact:  Marilu Favre, RN Phone Number: 03/12/2019, 8:43 AM   Clinical Narrative:     Patient from home with family, Has a knee scooter. PT recommended 3 in 1 and rolling walker patient agreed to both . Malachy Mood with [facility] accepted referral. Ordered walker and 3 in 1 with Zack with [place]   Final next level of care: [place] Barriers to Discharge: No Barriers Identified   Patient Goals and CMS Choice Patient states their goals for this hospitalization and ongoing recovery are:: to go home  CMS Medicare.gov Compare Post Acute Care list provided to:: Patient Choice offered to / list presented to : Patient  Discharge Placement                       Discharge Plan and Services   Discharge Planning Services: CM Consult Post Acute Care Choice: Home Health, Durable Medical Equipment          DME Arranged: 3-N-1, Walker rolling DME Agency: [employer] Date DME Agency Contacted: 03/12/19 Time DME Agency Contacted: [phone] Representative spoke with at DME Agency: [place]: PT [place]: [place] Date [place]: 03/12/19 Time [place]: [phone] Representative spoke with at [place]: [place] ([place]) Interventions     Readmission Risk Interventions No flowsheet data found.

## 2019-03-12 NOTE — Progress Notes (Signed)
Discharge instruction packet was provided and verbal discharge instructions provided.  The patient verbalizes understanding those instructions.  The patient's wound vac was replaced with portable prevenna.  The instructions were given to the patient along with information booklet.  The patient had her needed equipment delivered to the room.  The patient was discharged to home via wheel chair with her husband.

## 2019-03-12 NOTE — Plan of Care (Signed)
  Problem: Education: Goal: Required Educational Video(s) Outcome: Progressing   Problem: Clinical Measurements: Goal: Postoperative complications will be avoided or minimized Outcome: Progressing   Problem: Skin Integrity: Goal: Demonstration of wound healing without infection will improve Outcome: Progressing   Problem: Education: Goal: Knowledge of General Education information will improve Description Including pain rating scale, medication(s)/side effects and non-pharmacologic comfort measures Outcome: Progressing   Problem: Health Behavior/Discharge Planning: Goal: Ability to manage health-related needs will improve Outcome: Progressing   Problem: Clinical Measurements: Goal: Ability to maintain clinical measurements within normal limits will improve Outcome: Progressing Goal: Will remain free from infection Outcome: Progressing Goal: Diagnostic test results will improve Outcome: Progressing   Problem: Activity: Goal: Risk for activity intolerance will decrease Outcome: Progressing   Problem: Nutrition: Goal: Adequate nutrition will be maintained Outcome: Progressing   Problem: Coping: Goal: Level of anxiety will decrease Outcome: Progressing   Problem: Elimination: Goal: Will not experience complications related to bowel motility Outcome: Progressing Goal: Will not experience complications related to urinary retention Outcome: Progressing   Problem: Pain Managment: Goal: General experience of comfort will improve Outcome: Progressing   Problem: Safety: Goal: Ability to remain free from injury will improve Outcome: Progressing   Problem: Skin Integrity: Goal: Risk for impaired skin integrity will decrease Outcome: Progressing

## 2019-03-12 NOTE — Progress Notes (Signed)
Subjective: 2 Days Post-Op Procedure(s) (LRB): OPEN REDUCTION INTERNAL FIXATION RIGHT ANKLE (Right) Patient reports pain as 0 on 0-10 scale.    Objective: Vital signs in last 24 hours: Temp:  [98 F (36.7 C)-98.6 F (37 C)] 98.2 F (36.8 C) (04/28 0355) Pulse Rate:  [66-71] 67 (04/28 0355) Resp:  [16-18] 16 (04/28 0355) BP: (130-173)/(60-78) 152/75 (04/28 0355) SpO2:  [99 %-100 %] 99 % (04/28 0355)  Intake/Output from previous day: 04/27 0701 - 04/28 0700 In: 720 [P.O.:720] Out: 1900 [Urine:1800; Drains:100] Intake/Output this shift: No intake/output data recorded.  Recent Labs    03/10/19 0823  HGB 12.8   Recent Labs    03/10/19 0823  WBC 13.1*  RBC 4.70  HCT 40.5  PLT 287   Recent Labs    03/10/19 0823  NA 139  K 3.8  CL 106  CO2 22  BUN 17  CREATININE 0.87  GLUCOSE 117*  CALCIUM 9.1   No results for input(s): LABPT, INR in the last 72 hours.  Right lower leg with VAC dressing in place and functioning well. VAC drainage ~ 50 cc total.   Assessment/Plan: 2 Days Post-Op Procedure(s) (LRB): OPEN REDUCTION INTERNAL FIXATION RIGHT ANKLE (Right) Plan DC home today with [place] PT/OT Plan DC on Prevena VAC. Follow up in the office next week.    Erlinda Hong, PA-C 03/12/2019, 7:10 AM  [facility] [phone]

## 2019-03-12 NOTE — Discharge Summary (Signed)
Discharge Diagnoses:  Active Problems:   Ankle dislocation, right, initial encounter   Trimalleolar fracture of ankle, closed, right, initial encounter   Surgeries: Procedure(s): OPEN REDUCTION INTERNAL FIXATION RIGHT ANKLE on 03/10/2019    Consultants:   Discharged Condition: Improved  Hospital Course: Julie Mann is an 73 y.o. female who was admitted 03/10/2019 with a chief complaint of right ankle fracture, with a final diagnosis of right ankle fracture.  Patient was brought to the operating room on 03/10/2019 and underwent Procedure(s): OPEN REDUCTION INTERNAL FIXATION RIGHT ANKLE.    Patient was given perioperative antibiotics:  Anti-infectives (From admission, onward)   Start     Dose/Rate Route Frequency Ordered Stop   03/10/19 0857  ceFAZolin (ANCEF) IVPB 2g/100 mL premix     2 g 200 mL/hr over 30 Minutes Intravenous 30 min pre-op 03/10/19 0859 03/10/19 0943   03/10/19 0851  ceFAZolin (ANCEF) 2-4 GM/100ML-% IVPB    Note to Pharmacy:  Elmer Sow   : cabinet override      03/10/19 0851 03/10/19 0913   03/10/19 0815  clindamycin (CLEOCIN) IVPB 900 mg  Status:  Discontinued     900 mg 100 mL/hr over 30 Minutes Intravenous On call to O.R. 03/10/19 7782 03/10/19 0901    .  Patient was given sequential compression devices, early ambulation, and aspirin for DVT prophylaxis.  Recent vital signs:  Patient Vitals for the past 24 hrs:  BP Temp Temp src Pulse Resp SpO2  03/12/19 0355 (!) 152/75 98.2 F (36.8 C) Oral 67 16 99 %  03/11/19 1927 (!) 146/65 98.6 F (37 C) Oral 67 16 100 %  03/11/19 1354 130/60 98.4 F (36.9 C) Oral 66 18 100 %  03/11/19 1145 (!) 169/78 - - 71 - 100 %  03/11/19 1133 (!) 173/63 98 F (36.7 C) Oral 71 16 100 %  .  Recent laboratory studies: No results found.  Discharge Medications:   Allergies as of 03/12/2019      Reactions   Eliquis [apixaban] Anaphylaxis   Made her feel sick   Ivp Dye [iodinated Diagnostic Agents] Swelling   Doxycycline  Other (See Comments)   Dizziness and tingling   Erythromycin Other (See Comments)   Hallucinations   Flonase [fluticasone Propionate]    Keflex [cephalexin] Other (See Comments)   Hallucinations.   Paxil [paroxetine Hcl] Diarrhea   Premarin [conjugated Estrogens]    Felt weird   Provera [medroxyprogesterone Acetate]    Patient do not remember reaction   Xarelto [rivaroxaban]    Made her feel sick   Zithromax [azithromycin] Other (See Comments)   hallucinations   Zoloft [sertraline Hcl] Other (See Comments)   sedation      Medication List    TAKE these medications   aspirin EC 81 MG tablet Take 1 tablet (81 mg total) by mouth daily.   atorvastatin 10 MG tablet Commonly known as:  LIPITOR Take 10 mg by mouth 2 (two) times a week. Tuesday and Friday   HYDROcodone-acetaminophen 5-325 MG tablet Commonly known as:  NORCO/VICODIN Take 1 tablet by mouth every 6 (six) hours as needed for moderate pain or severe pain (pain score 4-6).   ibuprofen 600 MG tablet Commonly known as:  ADVIL Take 1 tablet (600 mg total) by mouth every 8 (eight) hours as needed for headache or mild pain.   LORazepam 1 MG tablet Commonly known as:  ATIVAN TAKE 1/2-1 TABLET BY MOUTH ONCE DAILY AS NEEDED FOR ANXIETY What changed:  how much to take  how to take this  when to take this  reasons to take this  additional instructions   metoprolol tartrate 25 MG tablet Commonly known as:  LOPRESSOR Take 25 mg by mouth daily as needed (palpitations).   MULTIVITAMIN PO Take 1 tablet by mouth daily.   OVER THE COUNTER MEDICATION Take 5 mLs by mouth daily. 1 teaspoon of vinegar   OVER THE COUNTER MEDICATION Apply 1 application topically daily. Magnesium oil   OVER THE COUNTER MEDICATION Take 15 mLs by mouth 3 (three) times daily with meals. Liquid Acidophilus   Soothe 0.6-0.6 % Soln Generic drug:  Propylene Glycol-Glycerin Place 1 drop into both eyes daily as needed (pollen allergies).    traMADol 50 MG tablet Commonly known as:  ULTRAM Take 50 mg by mouth every 6 (six) hours as needed for pain.       Diagnostic Studies: Ct Ankle Right Wo Contrast  Result Date: 03/11/2019 CLINICAL DATA:  Right ankle pain and swelling after fall. EXAM: CT OF THE RIGHT ANKLE WITHOUT CONTRAST TECHNIQUE: Multidetector CT imaging of the right ankle was performed according to the standard protocol. Multiplanar CT image reconstructions were also generated. 3-dimensional CT images were rendered by post-processing of the original CT data on an acquisition workstation. The 3-dimensional CT images were interpreted and findings were reported in the accompanying complete CT report for this study. COMPARISON:  None. FINDINGS: Bones/Joint/Cartilage Acute, comminuted fracture of the medial malleolus with slight posterior displacement. Comminuted, distracted longitudinal fracture of the posterior malleolus with posterior dislocation of the talus with respect to the anterior two thirds of the tibial plafond. The distracted articular surface measures up to 1.5 cm. Acute, comminuted, oblique fracture of the distal fibular metaphysis with 1.6 cm posterior displacement and 1.3 cm of overriding fragments. The talar dome is intact. Comminuted, essentially nondisplaced, intra-articular fracture involving the base of the second metatarsal. Tiny avulsion fracture of the dorsal base of the third metatarsal. Nondisplaced fracture involving the medial base of the fourth metatarsal. Lisfranc alignment appears maintained. Joint spaces are preserved.  Osteopenia. Ligaments Suboptimally assessed by CT. Muscles and Tendons Intact. Soft tissues Prominent soft tissue swelling of the hindfoot. No soft tissue mass or fluid collection. IMPRESSION: 1. Acute, displaced trimalleolar fracture as described above. Posterior dislocation of the talus with respect to the anterior two thirds of the tibial plafond. 2. Essentially nondisplaced fractures  involving the bases of the second, third, and fourth metatarsals. Lisfranc alignment appears maintained. Consider further evaluation of the Lisfranc ligament with MRI as clinically indicated. These results will be called to the ordering clinician or representative by the Radiologist Assistant, and communication documented in the PACS or zVision Dashboard. Electronically Signed   By: Titus Dubin M.D.   On: 03/11/2019 08:32   Ct 3d Independent Darreld Mclean  Result Date: 03/11/2019 CLINICAL DATA:  Right ankle pain and swelling after fall. EXAM: CT OF THE RIGHT ANKLE WITHOUT CONTRAST TECHNIQUE: Multidetector CT imaging of the right ankle was performed according to the standard protocol. Multiplanar CT image reconstructions were also generated. 3-dimensional CT images were rendered by post-processing of the original CT data on an acquisition workstation. The 3-dimensional CT images were interpreted and findings were reported in the accompanying complete CT report for this study. COMPARISON:  None. FINDINGS: Bones/Joint/Cartilage Acute, comminuted fracture of the medial malleolus with slight posterior displacement. Comminuted, distracted longitudinal fracture of the posterior malleolus with posterior dislocation of the talus with respect to the anterior two thirds of  the tibial plafond. The distracted articular surface measures up to 1.5 cm. Acute, comminuted, oblique fracture of the distal fibular metaphysis with 1.6 cm posterior displacement and 1.3 cm of overriding fragments. The talar dome is intact. Comminuted, essentially nondisplaced, intra-articular fracture involving the base of the second metatarsal. Tiny avulsion fracture of the dorsal base of the third metatarsal. Nondisplaced fracture involving the medial base of the fourth metatarsal. Lisfranc alignment appears maintained. Joint spaces are preserved.  Osteopenia. Ligaments Suboptimally assessed by CT. Muscles and Tendons Intact. Soft tissues Prominent soft  tissue swelling of the hindfoot. No soft tissue mass or fluid collection. IMPRESSION: 1. Acute, displaced trimalleolar fracture as described above. Posterior dislocation of the talus with respect to the anterior two thirds of the tibial plafond. 2. Essentially nondisplaced fractures involving the bases of the second, third, and fourth metatarsals. Lisfranc alignment appears maintained. Consider further evaluation of the Lisfranc ligament with MRI as clinically indicated. These results will be called to the ordering clinician or representative by the Radiologist Assistant, and communication documented in the PACS or zVision Dashboard. Electronically Signed   By: Titus Dubin M.D.   On: 03/11/2019 08:32   Xr Shoulder Left  Result Date: 02/19/2019 Films of the left shoulder were obtained in several projections.  There is a very small inferior humeral head spur.  Joint space is well-maintained.  Humeral head is centered about the glenoid.  Normal space between the humeral head and the acromion.  No ectopic calcification.  Moderate degenerative changes at the chromic clavicular joint.  No acute changes   Patient benefited maximally from their hospital stay and there were no complications.     Disposition: Discharge disposition: 01-Home or Self Care      Discharge Instructions    Call MD / Call 911   Complete by:  As directed    If you experience chest pain or shortness of breath, CALL 911 and be transported to the hospital emergency room.  If you develope a fever above 101 F, pus (white drainage) or increased drainage or redness at the wound, or calf pain, call your surgeon's office.   Constipation Prevention   Complete by:  As directed    Drink plenty of fluids.  Prune juice may be helpful.  You may use a stool softener, such as Colace (over the counter) 100 mg twice a day.  Use MiraLax (over the counter) for constipation as needed.   Diet - low sodium heart healthy   Complete by:  As directed     Discharge instructions   Complete by:  As directed    Non weight bearing on right foot. Elevate the right foot higher than the level of your heart several times daily.  Keep Prevena VAC machine plugged into wall outlet as much as possible to keep charged.   Increase activity slowly as tolerated   Complete by:  As directed      Follow-up Information    Newt Minion, MD. Schedule an appointment as soon as possible for a visit on 03/19/2019.   Specialty:  Orthopedic Surgery Why:  For 1 week from the day of discharge Contact information: [address] [place] [address] [phone]            Signed: Erlinda Hong, PA-C 03/12/2019, 7:21 AM  [facility] orthopedic [phone]

## 2019-03-14 ENCOUNTER — Telehealth (INDEPENDENT_AMBULATORY_CARE_PROVIDER_SITE_OTHER): Payer: Self-pay

## 2019-03-14 ENCOUNTER — Telehealth (INDEPENDENT_AMBULATORY_CARE_PROVIDER_SITE_OTHER): Payer: Self-pay | Admitting: Orthopedic Surgery

## 2019-03-14 ENCOUNTER — Other Ambulatory Visit (INDEPENDENT_AMBULATORY_CARE_PROVIDER_SITE_OTHER): Payer: Self-pay | Admitting: Orthopedic Surgery

## 2019-03-14 MED ORDER — TRAMADOL HCL 50 MG PO TABS: 50.0000 mg | ORAL_TABLET | Freq: Four times a day (QID) | ORAL | 0 refills | Status: DC | PRN

## 2019-03-14 MED ORDER — OXYCODONE-ACETAMINOPHEN 5-325 MG PO TABS: 1.0000 | ORAL_TABLET | Freq: Three times a day (TID) | ORAL | 0 refills | Status: DC | PRN

## 2019-03-14 NOTE — Telephone Encounter (Signed)
Please see below and advise.

## 2019-03-14 NOTE — Telephone Encounter (Signed)
I sent rx for ultram and percocet

## 2019-03-14 NOTE — Telephone Encounter (Signed)
Hoyle Sauer (physical therapist) calling for a verbal order   1x a week for 1 wk   2x a week for 8wks    cb [phone]

## 2019-03-14 NOTE — Telephone Encounter (Signed)
Patient had SU 03/10/2019- ORIF RIGHT ANKLE. States she was given hydrocodone  For pain And it made her sick. She would like to know if there is anything else she can get prescribed. She is currently taking Tylenol/ Ibuprofen.     Uses [facility]  [address]  [phone]

## 2019-03-15 ENCOUNTER — Other Ambulatory Visit: Payer: Self-pay

## 2019-03-15 ENCOUNTER — Encounter (INDEPENDENT_AMBULATORY_CARE_PROVIDER_SITE_OTHER): Payer: Self-pay | Admitting: Physician Assistant

## 2019-03-15 ENCOUNTER — Ambulatory Visit (INDEPENDENT_AMBULATORY_CARE_PROVIDER_SITE_OTHER): Payer: Medicare Other | Admitting: Physician Assistant

## 2019-03-15 VITALS — Ht 66.5 in | Wt 138.9 lb

## 2019-03-15 DIAGNOSIS — S82851D Displaced trimalleolar fracture of right lower leg, subsequent encounter for closed fracture with routine healing: Secondary | ICD-10-CM

## 2019-03-15 NOTE — Telephone Encounter (Signed)
PT was called and lvm for okay for verbal orders per Dr Sharol Given.

## 2019-03-15 NOTE — Progress Notes (Signed)
Office Visit Note   Patient: Julie Mann           Date of Birth: October 13, 1946           MRN: [id number] Visit Date: 03/15/2019              Requested by: Lajean Manes, MD [address] [place], [place] [address] PCP: Lajean Manes, MD  Chief Complaint  Patient presents with  . Right Ankle - Routine Post Op    03/10/2019 ORIF right ankle      HPI: The patient is a 73 yo woman who is seen for post operative follow up following ORIF of a right ankle trimalleolar fracture on 03/10/2019. She is non weight bearing in a fracture boot and uses her knee scooter at home. She has had the Prevena incisional vac on for the past week.  She reports confusion and feeling funny with the hydrocodone and tramadol and Percocet were prescribed yesterday, but she has not picked up the new medications as yet.   Assessment & Plan: Visit Diagnoses:  1. Closed trimalleolar fracture of right ankle with routine healing, subsequent encounter     Plan: Prevena VAC was removed. Blisters over the ankle are much improved. Will have the patient start using a medical compression sock and she was given a prescription for a Vive large sock to wear around the clock except for showering. She may shower and get the right leg wet and use Dial soap and water to the area and then pat dry and use the medical compression sock. Written directions were given to the patient for the above.  Follow up in 1 week.   Follow-Up Instructions: Return in about 1 week (around 03/22/2019).   Ortho Exam  Patient is alert, oriented, no adenopathy, well-dressed, normal affect, normal respiratory effort. Prevena VAC was removed. There is maturing ecchymosis over the ankle. The fracture blisters which was present initially are much improved/resolved. There is scant serous drainage. Incisions with sutures intact and without signs of infection or cellulitis. Good pedal pulses.   Imaging: No results found.    Labs: No  results found for: HGBA1C, ESRSEDRATE, CRP, LABURIC, REPTSTATUS, GRAMSTAIN, CULT, LABORGA   Lab Results  Component Value Date   ALBUMIN 4.1 03/24/2009   ALBUMIN 3.9 09/24/2008   ALBUMIN 4.2 03/24/2008    Body mass index is 22.08 kg/m.  Orders:  No orders of the defined types were placed in this encounter.  No orders of the defined types were placed in this encounter.    Procedures: No procedures performed  Clinical Data: No additional findings.  ROS:  All other systems negative, except as noted in the HPI. Review of Systems  Objective: Vital Signs: Ht 5' 6.5" (1.689 m)   Wt 138 lb 14.2 oz (63 kg)   LMP 11/14/1992   BMI 22.08 kg/m   Specialty Comments:  No specialty comments available.  PMFS History: Patient Active Problem List   Diagnosis Date Noted  . Ankle dislocation, right, initial encounter 03/10/2019  . Trimalleolar fracture of ankle, closed, right, initial encounter   . Chronic left shoulder pain 02/19/2019  . Anxiety 12/17/2016  . 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 Osteopenia 12/17/2016  . Paroxysmal atrial fibrillation ([place]) 12/17/2016  . Pure hypercholesterolemia 12/17/2016  . Rectal cancer ([place]) 07/30/2015   Past Medical History:  Diagnosis Date  . 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 Arthritis    "  hands, fingers, back" (02/27/2014)  . Atrial fibrillation ([place])   . Cat scratch of right hand 02/24/2014  . Dysrhythmia    a fib  . Fall 01/2018  . Fall from horse 01/2018  . High cholesterol   . History of stomach ulcers ~ 1966  . Osteopenia     Family History  Problem Relation Age of Onset  . Breast cancer Maternal Aunt   . Breast cancer Maternal Grandmother   . Heart disease Mother   . Stroke Father   . Rectal cancer Paternal Grandmother   . Breast cancer Sister 14  . Breast cancer Sister 108    Past Surgical History:  Procedure Laterality Date  . ANUS SURGERY  2005   "biopsy"  . BREAST  CYST EXCISION  1966  . DILATION AND CURETTAGE OF UTERUS  1980's   S/P miscarriage  . EXTERNAL FIXATION LEG Right 03/10/2019   Procedure: OPEN REDUCTION INTERNAL FIXATION RIGHT ANKLE;  Surgeon: Marchia Bond, MD;  Location: [facility];  Service: Orthopedics;  Laterality: Right;  Marland Kitchen VARICOSE VEIN SURGERY Left    left leg   Social History   Occupational History  . Not on file  Tobacco Use  . Smoking status: Former Smoker    Packs/day: 1.00    Years: 30.00    Pack years: 30.00    Types: Cigarettes  . Smokeless tobacco: Never Used  Substance and Sexual Activity  . Alcohol use: No  . Drug use: No  . Sexual activity: Yes    Partners: Male    Birth control/protection: Post-menopausal

## 2019-03-15 NOTE — Telephone Encounter (Signed)
Patient was called and lvm to inform her of Rx refill.

## 2019-03-18 ENCOUNTER — Other Ambulatory Visit: Payer: Self-pay

## 2019-03-18 NOTE — Telephone Encounter (Signed)
Medication refill request: ibuprofen 600mg  Last AEX:  02-23-18 Next AEX: 04-05-2019 Last MMG (if hormonal medication request): n/a Refill authorized: please approve if appropriate.   Medication refill request: lorazepam 1mg  Last AEX:  02-23-18 Next AEX: 04-05-2019 Last MMG (if hormonal medication request): n/a Refill authorized: please approve if appropriate

## 2019-03-19 MED ORDER — LORAZEPAM 1 MG PO TABS: ORAL_TABLET | ORAL | 0 refills | Status: DC

## 2019-03-19 MED ORDER — IBUPROFEN 600 MG PO TABS: 600.0000 mg | ORAL_TABLET | Freq: Three times a day (TID) | ORAL | 0 refills | Status: DC | PRN

## 2019-03-19 NOTE — Telephone Encounter (Signed)
Rx's did go through.  Thanks for letting her know that I was having trouble earlier.

## 2019-03-19 NOTE — Telephone Encounter (Signed)
Patient is calling regarding refill request.

## 2019-03-19 NOTE — Telephone Encounter (Signed)
Patient is aware that if they aren't able to go thru later today that someone will call her on Thursday when the rxs are done. Routing to Dr Sabra Heck.

## 2019-03-19 NOTE — Telephone Encounter (Signed)
Please let pt know this is not going through electronically and will have to be done via hand signed rx.  I will be in the office on Thursday and take care of it them.  I'm sorry.  I will try again later today.

## 2019-03-20 ENCOUNTER — Ambulatory Visit (INDEPENDENT_AMBULATORY_CARE_PROVIDER_SITE_OTHER): Payer: Medicare Other | Admitting: Family

## 2019-03-20 ENCOUNTER — Ambulatory Visit: Payer: Self-pay

## 2019-03-20 ENCOUNTER — Other Ambulatory Visit: Payer: Self-pay

## 2019-03-20 ENCOUNTER — Encounter: Payer: Self-pay | Admitting: Family

## 2019-03-20 VITALS — Ht 66.5 in | Wt 138.9 lb

## 2019-03-20 DIAGNOSIS — S82851A Displaced trimalleolar fracture of right lower leg, initial encounter for closed fracture: Secondary | ICD-10-CM

## 2019-03-20 NOTE — Progress Notes (Signed)
Post-Op Visit Note   Patient: Julie Mann           Date of Birth: 1946/04/12           MRN: [id number] Visit Date: 03/20/2019 PCP: Lajean Manes, MD  Chief Complaint:  Chief Complaint  Patient presents with  . Right Ankle - Routine Post Op    03/10/2019 ORIF right ankle    HPI:  HPI Patient is a 73 year old woman seen today 2 weeks status post right trimalleolar fracture repair. Has been nonweight bearing in CAM walker with vive stocking, direct skin contact. Complaining about the compression tightness and pain donning and doffing. Ortho Exam On exam incisions well approximated with sutures. Scant serous drainage medially from portal and laterally. Moderate edema. No erythema. No odor. No sign of infection.  Visit Diagnoses:  1. Trimalleolar fracture of ankle, closed, right, initial encounter     Plan: medial sutures harvested. Continue daily dial soap cleansing. Apply dry dressing and ace. Elevate. Cam walker. nonweight bearing. Follow up in office in 2 weeks with radiographs of ankle with dr. Sharol Given.  Follow-Up Instructions: Return in about 2 weeks (around 04/03/2019).   Imaging: No results found.  Orders:  Orders Placed This Encounter  Procedures  . XR Ankle Complete Right   No orders of the defined types were placed in this encounter.    PMFS History: Patient Active Problem List   Diagnosis Date Noted  . Ankle dislocation, right, initial encounter 03/10/2019  . Trimalleolar fracture of ankle, closed, right, initial encounter   . Chronic left shoulder pain 02/19/2019  . Anxiety 12/17/2016  . 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 Osteopenia 12/17/2016  . Paroxysmal atrial fibrillation ([place]) 12/17/2016  . Pure hypercholesterolemia 12/17/2016  . Rectal cancer ([place]) 07/30/2015   Past Medical History:  Diagnosis Date  . 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 Arthritis    "hands, fingers, back" (02/27/2014)  . Atrial  fibrillation ([place])   . Cat scratch of right hand 02/24/2014  . Dysrhythmia    a fib  . Fall 01/2018  . Fall from horse 01/2018  . High cholesterol   . History of stomach ulcers ~ 1966  . Osteopenia     Family History  Problem Relation Age of Onset  . Breast cancer Maternal Aunt   . Breast cancer Maternal Grandmother   . Heart disease Mother   . Stroke Father   . Rectal cancer Paternal Grandmother   . Breast cancer Sister 40  . Breast cancer Sister 34    Past Surgical History:  Procedure Laterality Date  . ANUS SURGERY  2005   "biopsy"  . BREAST CYST EXCISION  1966  . DILATION AND CURETTAGE OF UTERUS  1980's   S/P miscarriage  . EXTERNAL FIXATION LEG Right 03/10/2019   Procedure: OPEN REDUCTION INTERNAL FIXATION RIGHT ANKLE;  Surgeon: Marchia Bond, MD;  Location: [facility];  Service: Orthopedics;  Laterality: Right;  Marland Kitchen VARICOSE VEIN SURGERY Left    left leg   Social History   Occupational History  . Not on file  Tobacco Use  . Smoking status: Former Smoker    Packs/day: 1.00    Years: 30.00    Pack years: 30.00    Types: Cigarettes  . Smokeless tobacco: Never Used  Substance and Sexual Activity  . Alcohol use: No  . Drug use: No  . Sexual activity: Yes    Partners: Male  Birth control/protection: Post-menopausal

## 2019-03-22 ENCOUNTER — Ambulatory Visit: Payer: Medicare Other | Admitting: Physician Assistant

## 2019-03-28 ENCOUNTER — Other Ambulatory Visit: Payer: Self-pay | Admitting: Physician Assistant

## 2019-03-28 DIAGNOSIS — S82851A Displaced trimalleolar fracture of right lower leg, initial encounter for closed fracture: Secondary | ICD-10-CM

## 2019-03-28 MED ORDER — HYDROCODONE-ACETAMINOPHEN 5-325 MG PO TABS: 1.0000 | ORAL_TABLET | Freq: Four times a day (QID) | ORAL | 0 refills | Status: DC | PRN

## 2019-03-28 NOTE — Telephone Encounter (Signed)
Please advise, patient had ORIF surg on 4/26 and is asking for a refill on Hydro-Acet 5/325. Thank you

## 2019-03-28 NOTE — Telephone Encounter (Signed)
Sent refill for hydrocodone to pharmacy listed.

## 2019-03-28 NOTE — Telephone Encounter (Signed)
Wean off narcotics

## 2019-03-28 NOTE — Telephone Encounter (Signed)
Pt called in wanting a refill for HYDROcodone-acetaminophen (NORCO/VICODIN) 5-325 MG tablet... 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 But states she would still like a refill

## 2019-03-28 NOTE — Telephone Encounter (Signed)
Please advise, thank you.

## 2019-03-28 NOTE — Telephone Encounter (Signed)
Pt was called and informed and also stated that she can not wear the boot that we gave her and she will be in next week with hopes of swelling going down. She also stated that she thought she could just stop taking this medication and wing her way off it but informs me that she can not. I will advise Dr Sharol Given of this situation.

## 2019-04-03 ENCOUNTER — Encounter: Payer: Self-pay | Admitting: Family

## 2019-04-03 ENCOUNTER — Ambulatory Visit (INDEPENDENT_AMBULATORY_CARE_PROVIDER_SITE_OTHER): Payer: Medicare Other | Admitting: Family

## 2019-04-03 ENCOUNTER — Ambulatory Visit (INDEPENDENT_AMBULATORY_CARE_PROVIDER_SITE_OTHER): Payer: Medicare Other

## 2019-04-03 ENCOUNTER — Other Ambulatory Visit: Payer: Self-pay

## 2019-04-03 VITALS — Ht 66.0 in | Wt 138.0 lb

## 2019-04-03 DIAGNOSIS — M25571 Pain in right ankle and joints of right foot: Secondary | ICD-10-CM

## 2019-04-03 DIAGNOSIS — S82851A Displaced trimalleolar fracture of right lower leg, initial encounter for closed fracture: Secondary | ICD-10-CM

## 2019-04-05 ENCOUNTER — Ambulatory Visit: Payer: Medicare Other | Admitting: Obstetrics & Gynecology

## 2019-04-10 ENCOUNTER — Encounter: Payer: Self-pay | Admitting: Family

## 2019-04-10 NOTE — Progress Notes (Signed)
Post-Op Visit Note   Patient: Julie Mann           Date of Birth: 01-15-1946           MRN: [id number] Visit Date: 04/03/2019 PCP: Lajean Manes, MD  Chief Complaint:  Chief Complaint  Patient presents with  . Right Ankle - Routine Post Op    03/10/19 ORIF right ankle     HPI:  HPI Patient is a 73 year old woman seen today status post right trimalleolar fracture repair. Has been nonweight bearing. Not wearing the cam boot, cannot wear as cannot get ankle to neutral. Doing daily dial soap cleansing and dry dressings. Using ace for compression.  Ortho Exam On exam incisions well approximated with sutures. Minimal swelling. No drainage. No erythema. No odor. No sign of infection. Is developing an equinus contracture.  Visit Diagnoses:  1. Pain in right ankle and joints of right foot   2. Trimalleolar fracture of ankle, closed, right, initial encounter     Plan: remaining sutures harvested. Continue daily dial soap cleansing. Apply medical compression stocking with direct skin contact. Elevate. Cam walker, work on heel cord stretching and ankle rom. May begin tdwtb as tolerated. Follow up in office in 2 weeks with radiographs of ankle with dr. Sharol Given.  Follow-Up Instructions: No follow-ups on file.   Imaging: No results found.  Orders:  Orders Placed This Encounter  Procedures  . XR Ankle Complete Right   No orders of the defined types were placed in this encounter.    PMFS History: Patient Active Problem List   Diagnosis Date Noted  . Ankle dislocation, right, initial encounter 03/10/2019  . Trimalleolar fracture of ankle, closed, right, initial encounter   . Chronic left shoulder pain 02/19/2019  . Anxiety 12/17/2016  . 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 Osteopenia 12/17/2016  . Paroxysmal atrial fibrillation ([place]) 12/17/2016  . Pure hypercholesterolemia 12/17/2016  . Rectal cancer ([place]) 07/30/2015   Past Medical History:  Diagnosis Date  .  Anal cancer ([place]) 2005   SCCa anus-stage II chemo, radiation  . Arthritis    "hands, fingers, back" (02/27/2014)  . Atrial fibrillation ([place])   . Cat scratch of right hand 02/24/2014  . Dysrhythmia    a fib  . Fall 01/2018  . Fall from horse 01/2018  . High cholesterol   . History of stomach ulcers ~ 1966  . Osteopenia     Family History  Problem Relation Age of Onset  . Breast cancer Maternal Aunt   . Breast cancer Maternal Grandmother   . Heart disease Mother   . Stroke Father   . Rectal cancer Paternal Grandmother   . Breast cancer Sister 49  . Breast cancer Sister 44    Past Surgical History:  Procedure Laterality Date  . ANUS SURGERY  2005   "biopsy"  . BREAST CYST EXCISION  1966  . DILATION AND CURETTAGE OF UTERUS  1980's   S/P miscarriage  . EXTERNAL FIXATION LEG Right 03/10/2019   Procedure: OPEN REDUCTION INTERNAL FIXATION RIGHT ANKLE;  Surgeon: Marchia Bond, MD;  Location: [facility];  Service: Orthopedics;  Laterality: Right;  Marland Kitchen VARICOSE VEIN SURGERY Left    left leg   Social History   Occupational History  . Not on file  Tobacco Use  . Smoking status: Former Smoker    Packs/day: 1.00    Years: 30.00    Pack years: 30.00    Types: Cigarettes  . Smokeless  tobacco: Never Used  Substance and Sexual Activity  . Alcohol use: No  . Drug use: No  . Sexual activity: Yes    Partners: Male    Birth control/protection: Post-menopausal

## 2019-04-13 ENCOUNTER — Telehealth: Payer: Self-pay | Admitting: Internal Medicine

## 2019-04-13 ENCOUNTER — Other Ambulatory Visit: Payer: Medicare Other

## 2019-04-13 DIAGNOSIS — Z20822 Contact with and (suspected) exposure to covid-19: Secondary | ICD-10-CM

## 2019-04-13 NOTE — Telephone Encounter (Signed)
Appointment scheduled for today, she's already at the testing site.

## 2019-04-13 NOTE — Telephone Encounter (Signed)
Spoke with the patient about possible Covid-19 exposure at 04/03/19 office visit to [facility].  RN explained the free Covid-19 screening offered for this exposure.  Notified that she and her driver should wear a masks and the drive-up screening will take place at the old [facility] on [address].  Patient has a broken ankle and will need to arrange transportation.

## 2019-04-16 LAB — NOVEL CORONAVIRUS, NAA: SARS-CoV-2, NAA: NOT DETECTED

## 2019-04-17 ENCOUNTER — Ambulatory Visit (INDEPENDENT_AMBULATORY_CARE_PROVIDER_SITE_OTHER): Payer: Medicare Other

## 2019-04-17 ENCOUNTER — Ambulatory Visit (INDEPENDENT_AMBULATORY_CARE_PROVIDER_SITE_OTHER): Payer: Medicare Other | Admitting: Family

## 2019-04-17 ENCOUNTER — Other Ambulatory Visit: Payer: Self-pay

## 2019-04-17 ENCOUNTER — Encounter: Payer: Self-pay | Admitting: Orthopedic Surgery

## 2019-04-17 VITALS — Ht 66.0 in | Wt 138.0 lb

## 2019-04-17 DIAGNOSIS — S82851A Displaced trimalleolar fracture of right lower leg, initial encounter for closed fracture: Secondary | ICD-10-CM

## 2019-04-17 DIAGNOSIS — M25571 Pain in right ankle and joints of right foot: Secondary | ICD-10-CM

## 2019-04-18 ENCOUNTER — Encounter: Payer: Self-pay | Admitting: Family

## 2019-04-18 NOTE — Progress Notes (Signed)
   Post-Op Visit Note   Patient: Julie Mann           Date of Birth: [birth year]           MRN: [id number] Visit Date: 04/17/2019 PCP: Lajean Manes, MD  Chief Complaint:  Chief Complaint  Patient presents with  . Right Ankle - Routine Post Op    HPI:  HPI Patient is a 73 year old woman seen today status post right trimalleolar fracture repair. Has been full weight bearing in her CAM. Doing incisional care daily. Working on ankle rom and heel cord stretching.   Ortho Exam On exam incisions well healed. Minimal swelling.  No erythema. No sign of infection. Dorsiflexion just shy of neutral.  Visit Diagnoses:  1. Pain in right ankle and joints of right foot   2. Trimalleolar fracture of ankle, closed, right, initial encounter     Plan: given an aso. Will advance weight bearing as tolerated. Radiographs show anatomic alignment. Follow up in office as needed in 3 weeks.   Follow-Up Instructions: No follow-ups on file.   Imaging: No results found.  Orders:  Orders Placed This Encounter  Procedures  . XR Ankle Complete Right   No orders of the defined types were placed in this encounter.    PMFS History: Patient Active Problem List   Diagnosis Date Noted  . Ankle dislocation, right, initial encounter 03/10/2019  . Trimalleolar fracture of ankle, closed, right, initial encounter   . Chronic left shoulder pain 02/19/2019  . Anxiety 12/17/2016  . 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[place]) 12/17/2016  . Osteopenia 12/17/2016  . Paroxysmal atrial fibrillation ([place]) 12/17/2016  . Pure hypercholesterolemia 12/17/2016  . Rectal cancer ([place]) 07/30/2015   Past Medical History:  Diagnosis Date  . 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 Arthritis    "hands, fingers, back" (02/27/2014)  . Atrial fibrillation ([place])   . Cat scratch of right hand 02/24/2014  . Dysrhythmia    a fib  . Fall 01/2018  . Fall from horse 01/2018  . High cholesterol   .  History of stomach ulcers ~ 1966  . Osteopenia     Family History  Problem Relation Age of Onset  . Breast cancer Maternal Aunt   . Breast cancer Maternal Grandmother   . Heart disease Mother   . Stroke Father   . Rectal cancer Paternal Grandmother   . Breast cancer Sister 64  . Breast cancer Sister 72    Past Surgical History:  Procedure Laterality Date  . ANUS SURGERY  2005   "biopsy"  . BREAST CYST EXCISION  1966  . DILATION AND CURETTAGE OF UTERUS  1980's   S/P miscarriage  . EXTERNAL FIXATION LEG Right 03/10/2019   Procedure: OPEN REDUCTION INTERNAL FIXATION RIGHT ANKLE;  Surgeon: Marchia Bond, MD;  Location: [facility];  Service: Orthopedics;  Laterality: Right;  Marland Kitchen VARICOSE VEIN SURGERY Left    left leg   Social History   Occupational History  . Not on file  Tobacco Use  . Smoking status: Former Smoker    Packs/day: 1.00    Years: 30.00    Pack years: 30.00    Types: Cigarettes  . Smokeless tobacco: Never Used  Substance and Sexual Activity  . Alcohol use: No  . Drug use: No  . Sexual activity: Yes    Partners: Male    Birth control/protection: Post-menopausal

## 2019-04-24 ENCOUNTER — Other Ambulatory Visit: Payer: Self-pay

## 2019-04-24 ENCOUNTER — Ambulatory Visit
Admission: RE | Admit: 2019-04-24 | Discharge: 2019-04-24 | Disposition: A | Discharge status: Home / Self Care | Payer: Medicare Other | Source: Ambulatory Visit | Attending: Orthopaedic Surgery | Admitting: Orthopaedic Surgery

## 2019-04-24 DIAGNOSIS — G8929 Other chronic pain: Secondary | ICD-10-CM

## 2019-04-26 ENCOUNTER — Telehealth: Payer: Self-pay | Admitting: Orthopaedic Surgery

## 2019-04-26 NOTE — Telephone Encounter (Signed)
Patient was returning Dr. Rudene Anda call from 04/25/2019. Patient wants to thank doctor for his call, and information in regards to her shoulder. Patient wants to let doctor know she is doing well with her foot, "it is just a long recovery". Patient also would like to know since diagnosis is arthritis, can she come back for an injection in that Lt shoulder, and when? Please call to inform.

## 2019-04-26 NOTE — Telephone Encounter (Signed)
Please see below.

## 2019-04-30 NOTE — Telephone Encounter (Signed)
Called and discussed MRI results

## 2019-05-14 ENCOUNTER — Ambulatory Visit: Payer: Self-pay

## 2019-05-14 ENCOUNTER — Encounter: Payer: Self-pay | Admitting: Family

## 2019-05-14 ENCOUNTER — Ambulatory Visit (INDEPENDENT_AMBULATORY_CARE_PROVIDER_SITE_OTHER): Payer: Medicare Other | Admitting: Orthopedic Surgery

## 2019-05-14 ENCOUNTER — Other Ambulatory Visit: Payer: Self-pay

## 2019-05-14 VITALS — Ht 66.0 in | Wt 138.0 lb

## 2019-05-14 DIAGNOSIS — S82851A Displaced trimalleolar fracture of right lower leg, initial encounter for closed fracture: Secondary | ICD-10-CM | POA: Diagnosis not present

## 2019-05-14 DIAGNOSIS — M25871 Other specified joint disorders, right ankle and foot: Secondary | ICD-10-CM

## 2019-05-18 DIAGNOSIS — S82851A Displaced trimalleolar fracture of right lower leg, initial encounter for closed fracture: Secondary | ICD-10-CM

## 2019-05-18 MED ORDER — LIDOCAINE HCL 1 % IJ SOLN
2.0000 mL | INTRAMUSCULAR | Status: AC | PRN
  Administered 2019-05-18: 2 mL

## 2019-05-18 MED ORDER — METHYLPREDNISOLONE ACETATE 40 MG/ML IJ SUSP
40.0000 mg | INTRAMUSCULAR | Status: AC | PRN
  Administered 2019-05-18: 09:00:00 40 mg via INTRA_ARTICULAR

## 2019-05-18 NOTE — Progress Notes (Signed)
Office Visit Note   Patient: Julie Mann           Date of Birth: 04/25/1946           MRN: [id number] Visit Date: 05/14/2019              Requested by: Lajean Manes, MD [address] [place],  [place] [address] PCP: Lajean Manes, MD  Chief Complaint  Patient presents with  . Right Ankle - Routine Post Op    03/10/2019 ORIF trimal ankle fx       HPI: Patient is a 73 year old woman who presents 2 months status post open reduction internal fixation trimalleolar right ankle fracture.  Patient is currently weightbearing as tolerated with a cane.  She states she does have some pain and swelling across the ankle.  Assessment & Plan: Visit Diagnoses:  1. Trimalleolar fracture of ankle, closed, right, initial encounter   2. Impingement syndrome of right ankle     Plan: The ankle was injected she tolerated this well continue to increase her activities as tolerated continue with strengthening.  Follow-Up Instructions: Return in about 4 weeks (around 06/11/2019).   Ortho Exam  Patient is alert, oriented, no adenopathy, well-dressed, normal affect, normal respiratory effort. Examination the incisions are well-healed.  She has good range of motion the ankle she does have tenderness to palpation anteriorly consistent with impingement.  There is no redness no cellulitis no signs of infection.  Ankle was injected for impingement symptoms.  Imaging: No results found. No images are attached to the encounter.  Labs: No results found for: HGBA1C, ESRSEDRATE, CRP, LABURIC, REPTSTATUS, GRAMSTAIN, CULT, LABORGA   Lab Results  Component Value Date   ALBUMIN 4.1 03/24/2009   ALBUMIN 3.9 09/24/2008   ALBUMIN 4.2 03/24/2008    No results found for: MG No results found for: VD25OH  No results found for: PREALBUMIN CBC EXTENDED Latest Ref Rng & Units 03/10/2019 02/05/2018 10/25/2016  WBC 4.0 - 10.5 K/uL 13.1(H) 15.7(H) 6.4  RBC 3.87 - 5.11 MIL/uL 4.70 5.17(H) 5.20(H)   HGB 12.0 - 15.0 g/dL 12.8 14.2 14.0  HCT 36.0 - 46.0 % 40.5 43.5 42.7  PLT 150 - 400 K/uL 287 372 311  NEUTROABS 1.7 - 7.7 K/uL - 13.9(H) -  LYMPHSABS 0.7 - 4.0 K/uL - 1.0 -     Body mass index is 22.27 kg/m.  Orders:  Orders Placed This Encounter  Procedures  . Medium Joint Inj  . XR Ankle Complete Right   No orders of the defined types were placed in this encounter.    Procedures: Medium Joint Inj on 05/18/2019 9:25 AM Indications: pain and diagnostic evaluation Details: 22 G 1.5 in needle, anteromedial approach Medications: 2 mL lidocaine 1 %; 40 mg methylPREDNISolone acetate 40 MG/ML Outcome: tolerated well, no immediate complications Procedure, treatment alternatives, risks and benefits explained, specific risks discussed. Consent was given by the patient. Immediately prior to procedure a time out was called to verify the correct patient, procedure, equipment, support staff and site/side marked as required. Patient was prepped and draped in the usual sterile fashion.      Clinical Data: No additional findings.  ROS:  All other systems negative, except as noted in the HPI. Review of Systems  Objective: Vital Signs: Ht 5\' 6"  (1.676 m)   Wt 138 lb (62.6 kg)   LMP 11/14/1992   BMI 22.27 kg/m   Specialty Comments:  No specialty comments available.  PMFS History: Patient Active  Problem List   Diagnosis Date Noted  . Ankle dislocation, right, initial encounter 03/10/2019  . Trimalleolar fracture of ankle, closed, right, initial encounter   . Chronic left shoulder pain 02/19/2019  . Anxiety 12/17/2016  . 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[place]) 12/17/2016  . Osteopenia 12/17/2016  . Paroxysmal atrial fibrillation ([place]) 12/17/2016  . Pure hypercholesterolemia 12/17/2016  . Rectal cancer ([place]) 07/30/2015   Past Medical History:  Diagnosis Date  . 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 Arthritis    "hands, fingers, back" (02/27/2014)  .  Atrial fibrillation ([place])   . Cat scratch of right hand 02/24/2014  . Dysrhythmia    a fib  . Fall 01/2018  . Fall from horse 01/2018  . High cholesterol   . History of stomach ulcers ~ 1966  . Osteopenia     Family History  Problem Relation Age of Onset  . Breast cancer Maternal Aunt   . Breast cancer Maternal Grandmother   . Heart disease Mother   . Stroke Father   . Rectal cancer Paternal Grandmother   . Breast cancer Sister 48  . Breast cancer Sister 37    Past Surgical History:  Procedure Laterality Date  . ANUS SURGERY  2005   "biopsy"  . BREAST CYST EXCISION  1966  . DILATION AND CURETTAGE OF UTERUS  1980's   S/P miscarriage  . EXTERNAL FIXATION LEG Right 03/10/2019   Procedure: OPEN REDUCTION INTERNAL FIXATION RIGHT ANKLE;  Surgeon: Marchia Bond, MD;  Location: [facility];  Service: Orthopedics;  Laterality: Right;  Marland Kitchen VARICOSE VEIN SURGERY Left    left leg   Social History   Occupational History  . Not on file  Tobacco Use  . Smoking status: Former Smoker    Packs/day: 1.00    Years: 30.00    Pack years: 30.00    Types: Cigarettes  . Smokeless tobacco: Never Used  Substance and Sexual Activity  . Alcohol use: No  . Drug use: No  . Sexual activity: Yes    Partners: Male    Birth control/protection: Post-menopausal

## 2019-05-31 ENCOUNTER — Other Ambulatory Visit: Payer: Self-pay | Admitting: Geriatric Medicine

## 2019-05-31 DIAGNOSIS — R911 Solitary pulmonary nodule: Secondary | ICD-10-CM

## 2019-06-19 ENCOUNTER — Ambulatory Visit
Admission: RE | Admit: 2019-06-19 | Discharge: 2019-06-19 | Disposition: A | Discharge status: Home / Self Care | Payer: Medicare Other | Source: Ambulatory Visit | Attending: Geriatric Medicine | Admitting: Geriatric Medicine

## 2019-06-19 DIAGNOSIS — R911 Solitary pulmonary nodule: Secondary | ICD-10-CM

## 2019-06-20 ENCOUNTER — Ambulatory Visit: Payer: Medicare Other | Admitting: Orthopaedic Surgery

## 2019-06-20 ENCOUNTER — Encounter: Payer: Self-pay | Admitting: Orthopaedic Surgery

## 2019-06-20 ENCOUNTER — Other Ambulatory Visit: Payer: Self-pay

## 2019-06-20 VITALS — BP 156/75 | HR 69 | Ht 67.0 in | Wt 128.0 lb

## 2019-06-20 DIAGNOSIS — G8929 Other chronic pain: Secondary | ICD-10-CM | POA: Diagnosis not present

## 2019-06-20 DIAGNOSIS — M25512 Pain in left shoulder: Secondary | ICD-10-CM

## 2019-06-20 MED ORDER — LIDOCAINE HCL 1 % IJ SOLN
2.0000 mL | INTRAMUSCULAR | Status: AC | PRN
  Administered 2019-06-20: 2 mL

## 2019-06-20 MED ORDER — METHYLPREDNISOLONE ACETATE 40 MG/ML IJ SUSP
80.0000 mg | INTRAMUSCULAR | Status: AC | PRN
  Administered 2019-06-20: 80 mg via INTRA_ARTICULAR

## 2019-06-20 MED ORDER — BUPIVACAINE HCL 0.5 % IJ SOLN
2.0000 mL | INTRAMUSCULAR | Status: AC | PRN
  Administered 2019-06-20: 2 mL via INTRA_ARTICULAR
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
## 2019-06-24 ENCOUNTER — Other Ambulatory Visit: Payer: Self-pay | Admitting: Obstetrics & Gynecology

## 2019-06-24 DIAGNOSIS — Z1231 Encounter for screening mammogram for malignant neoplasm of breast: Secondary | ICD-10-CM

## 2019-06-28 ENCOUNTER — Other Ambulatory Visit: Payer: Self-pay | Admitting: Obstetrics & Gynecology

## 2019-06-28 NOTE — Telephone Encounter (Signed)
Medication refill request: ativan  Last AEX:  02/23/18 Next AEX: 07/25/19 Last MMG (if hormonal medication request): NA Refill authorized: 30 with 0 rf

## 2019-07-23 ENCOUNTER — Other Ambulatory Visit: Payer: Self-pay

## 2019-07-25 ENCOUNTER — Ambulatory Visit (INDEPENDENT_AMBULATORY_CARE_PROVIDER_SITE_OTHER): Payer: Medicare Other | Admitting: Obstetrics & Gynecology

## 2019-07-25 ENCOUNTER — Other Ambulatory Visit: Payer: Self-pay

## 2019-07-25 ENCOUNTER — Encounter: Payer: Self-pay | Admitting: Obstetrics & Gynecology

## 2019-07-25 ENCOUNTER — Other Ambulatory Visit (HOSPITAL_COMMUNITY)
Admission: RE | Admit: 2019-07-25 | Discharge: 2019-07-25 | Disposition: A | Discharge status: Home / Self Care | Payer: Medicare Other | Source: Ambulatory Visit | Attending: Obstetrics & Gynecology | Admitting: Obstetrics & Gynecology

## 2019-07-25 VITALS — BP 136/70 | HR 80 | Temp 97.2°F | Ht 66.5 in | Wt 131.8 lb

## 2019-07-25 DIAGNOSIS — Z124 Encounter for screening for malignant neoplasm of cervix: Secondary | ICD-10-CM

## 2019-07-25 DIAGNOSIS — Z01419 Encounter for gynecological examination (general) (routine) without abnormal findings: Secondary | ICD-10-CM

## 2019-07-25 MED ORDER — LORAZEPAM 1 MG PO TABS: ORAL_TABLET | ORAL | 1 refills | Status: DC

## 2019-07-25 MED ORDER — IBUPROFEN 600 MG PO TABS: 600.0000 mg | ORAL_TABLET | Freq: Three times a day (TID) | ORAL | 2 refills | Status: DC | PRN

## 2019-07-25 NOTE — Progress Notes (Signed)
73 y.o. [id number] Married White or Caucasian female here for annual exam.  Broke her ankle in April and had to have surgery.  Dr. Sharol Given did her surgery.  Just finished PT.  Having just some mild swelling in her right ankle.  Husband was home during this time and it worked out all okay.    PCP:  Dr. Felipa Eth.    Patient's last menstrual period was 11/14/1992.          Sexually active: Yes.    The current method of family planning is post menopausal status.    Exercising: Yes.    walking, PT Smoker:  no  Health Maintenance: Pap:  11/25/16 Neg  07/30/15 Neg History of abnormal Pap:  no MMG:  04/30/18 BIRADS1:neg. Has appt 08/07/19  Colonoscopy:  2016 f/u 5 years BMD:  12/18/17 Osteopenia TDaP:  2016 Pneumonia vaccine(s):  2015 Shingrix:   Zostavax 08/18/2006 Hep C testing: 11/25/16 Neg Screening Labs: PCP   reports that she has quit smoking. Her smoking use included cigarettes. She has a 30.00 pack-year smoking history. She has never used smokeless tobacco. She reports that she does not drink alcohol or use drugs.  Past Medical History:  Diagnosis Date  . 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 Arthritis    "hands, fingers, back" (02/27/2014)  . Atrial fibrillation ([facility])   . Cat scratch of right hand 02/24/2014  . Dysrhythmia    a fib  . Fall 01/2018  . Fall from horse 01/2018  . Fracture, ankle 02/2019   Right   . High cholesterol   . History of stomach ulcers ~ 1966  . Osteopenia     Past Surgical History:  Procedure Laterality Date  . ANUS SURGERY  2005   "biopsy"  . BREAST CYST EXCISION  1966  . DILATION AND CURETTAGE OF UTERUS  1980's   S/P miscarriage  . EXTERNAL FIXATION LEG Right 03/10/2019   Procedure: OPEN REDUCTION INTERNAL FIXATION RIGHT ANKLE;  Surgeon: Marchia Bond, MD;  Location: [facility];  Service: Orthopedics;  Laterality: Right;  Marland Kitchen VARICOSE VEIN SURGERY Left    left leg    Current Outpatient Medications  Medication Sig Dispense Refill   . aspirin EC 81 MG tablet Take 1 tablet (81 mg total) by mouth daily. 90 tablet 3  . atorvastatin (LIPITOR) 10 MG tablet Take 10 mg by mouth 2 (two) times a week. Tuesday and Friday  12  . ibuprofen (ADVIL) 600 MG tablet Take 1 tablet (600 mg total) by mouth every 8 (eight) hours as needed for headache or mild pain. 30 tablet 0  . LORazepam (ATIVAN) 1 MG tablet TAKE 1/2 TO 1 TABLET BY MOUTH EVERY DAY AS NEEDED FOR ANXIETY 30 tablet 0  . metoprolol tartrate (LOPRESSOR) 25 MG tablet Take 25 mg by mouth daily as needed (palpitations).     . Multiple Vitamins-Minerals (MULTIVITAMIN PO) Take 1 tablet by mouth daily.    Marland Kitchen omeprazole (PRILOSEC) 20 MG capsule Take 20 mg by mouth daily.    Marland Kitchen OVER THE COUNTER MEDICATION Take 5 mLs by mouth daily. 1 teaspoon of vinegar    . OVER THE COUNTER MEDICATION Take 15 mLs by mouth 3 (three) times daily with meals. Liquid Acidophilus    . Propylene Glycol-Glycerin (SOOTHE) 0.6-0.6 % SOLN Place 1 drop into both eyes daily as needed (pollen allergies).     No current facility-administered medications for this visit.     Family History  Problem Relation Age of Onset  . Breast cancer Maternal Aunt   . Breast cancer Maternal Grandmother   . Heart disease Mother   . Stroke Father   . Rectal cancer Paternal Grandmother   . Breast cancer Sister 64  . Breast cancer Sister 23    Review of Systems  Gastrointestinal: Positive for constipation.  All other systems reviewed and are negative.   Exam:   BP 136/70   Pulse 80   Temp (!) 97.2 F (36.2 C) (Temporal)   Ht 5' 6.5" (1.689 m)   Wt 131 lb 12.8 oz (59.8 kg)   LMP 11/14/1992   BMI 20.95 kg/m   Height:   Height: 5' 6.5" (168.9 cm)  Ht Readings from Last 3 Encounters:  07/25/19 5' 6.5" (1.689 m)  06/20/19 5\' 7"  (1.702 m)  05/14/19 5\' 6"  (1.676 m)    General appearance: alert, cooperative and appears stated age Head: Normocephalic, without obvious abnormality, atraumatic Neck: no adenopathy, supple,  symmetrical, trachea midline and thyroid normal to inspection and palpation Lungs: clear to auscultation bilaterally Breasts: normal appearance, no masses or tenderness Heart: regular rate and rhythm Abdomen: soft, non-tender; bowel sounds normal; no masses,  no organomegaly Extremities: extremities normal, atraumatic, no cyanosis or edema Skin: Skin color, texture, turgor normal. No rashes or lesions Lymph nodes: Cervical, supraclavicular, and axillary nodes normal. No abnormal inguinal nodes palpated Neurologic: Grossly normal   Pelvic: External genitalia:  no lesions              Urethra:  normal appearing urethra with no masses, tenderness or lesions              Bartholins and Skenes: normal                 Vagina: normal appearing vagina with normal color and discharge, no lesions              Cervix: no lesions              Pap taken: Yes.   Bimanual Exam:  Uterus:  normal size, contour, position, consistency, mobility, non-tender              Adnexa: normal adnexa and no mass, fullness, tenderness               Rectovaginal: Confirms               Anus:  normal sphincter tone, no lesions  Chaperone was present for exam.  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  Has MMG scheduled.  Doing yearly. pap smear obtained today Has appt with Dr. Felipa Eth next month with lab work schedule RF for Lorazepam 1mg , 1/2 tab at night prn insomnia.  #30/1RF RF for advil 600mg  every 8 hours prn.  #30/2RF Return annually or prn

## 2019-07-26 LAB — CYTOLOGY - PAP: Diagnosis: NEGATIVE

## 2019-08-07 ENCOUNTER — Ambulatory Visit
Admission: RE | Admit: 2019-08-07 | Discharge: 2019-08-07 | Disposition: A | Discharge status: Home / Self Care | Payer: Medicare Other | Source: Ambulatory Visit | Attending: Obstetrics & Gynecology | Admitting: Obstetrics & Gynecology

## 2019-08-07 ENCOUNTER — Other Ambulatory Visit: Payer: Self-pay

## 2019-08-07 DIAGNOSIS — Z1231 Encounter for screening mammogram for malignant neoplasm of breast: Secondary | ICD-10-CM

## 2019-08-08 ENCOUNTER — Encounter: Payer: Self-pay | Admitting: Orthopaedic Surgery

## 2019-08-08 ENCOUNTER — Ambulatory Visit (INDEPENDENT_AMBULATORY_CARE_PROVIDER_SITE_OTHER): Payer: Medicare Other | Admitting: Orthopaedic Surgery

## 2019-08-08 VITALS — BP 159/79 | HR 77 | Ht 66.5 in | Wt 131.0 lb

## 2019-08-08 DIAGNOSIS — G8929 Other chronic pain: Secondary | ICD-10-CM

## 2019-08-08 DIAGNOSIS — M25512 Pain in left shoulder: Secondary | ICD-10-CM | POA: Diagnosis not present

## 2019-08-08 MED ORDER — LIDOCAINE HCL 2 % IJ SOLN
2.0000 mL | INTRAMUSCULAR | Status: AC | PRN
  Administered 2019-08-08: 2 mL

## 2019-08-08 MED ORDER — BUPIVACAINE HCL 0.5 % IJ SOLN
2.0000 mL | INTRAMUSCULAR | Status: AC | PRN
  Administered 2019-08-08: 2 mL via INTRA_ARTICULAR

## 2019-08-08 MED ORDER — METHYLPREDNISOLONE ACETATE 40 MG/ML IJ SUSP
80.0000 mg | INTRAMUSCULAR | Status: AC | PRN
  Administered 2019-08-08: 80 mg via INTRA_ARTICULAR

## 2019-08-08 NOTE — Progress Notes (Signed)
Office Visit Note   Patient: Julie Mann           Date of Birth: May 12, 1946           MRN: [id number] Visit Date: 08/08/2019              Requested by: Lajean Manes, MD [address] [place],  [place] [address] PCP: Lajean Manes, MD   Assessment & Plan: Visit Diagnoses:  1. Chronic left shoulder pain     Plan: I saw [name] about 2 months ago for evaluation of her left shoulder pain.  She had an MRI scan in June that demonstrated some tendinosis of the rotator cuff but no tearing.  There was some interstitial tearing of the biceps tendon and degenerative changes at the [facility] joint and some areas of high-grade partial-thickness loss of the glenohumeral joint.  I injected the glenohumeral joint with immediate relief of her pain but she notes it only lasted about a week.  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  I think her biggest problem is the biceps tendon and will inject that today.  I have also talked in some detail about arthroscopic surgery to include an SCD DCR and probable tenodesis of the biceps tendon.  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  We will monitor her response to today's injection and try Voltaren gel  Follow-Up Instructions: Return if symptoms worsen or fail to improve.   Orders:  Orders Placed This Encounter  Procedures  . Large Joint Inj: L subacromial bursa   No orders of the defined types were placed in this encounter.     Procedures: Large Joint Inj: L subacromial bursa on 08/08/2019 10:20 AM Indications: pain and diagnostic evaluation Details: 25 G 1.5 in needle, anterolateral approach  Arthrogram: No  Medications: 2 mL lidocaine 2 %; 2 mL bupivacaine 0.5 %; 80 mg methylPREDNISolone acetate 40 MG/ML  I injected the area over the biceps tendon in its groove with immediate relief of her pain Consent was  given by the patient. Immediately prior to procedure a time out was called to verify the correct patient, procedure, equipment, support staff and site/side marked as required. Patient was prepped and draped in the usual sterile fashion.       Clinical Data: No additional findings.   Subjective: Chief Complaint  Patient presents with  . Left Shoulder - Pain  Patient presents today for follow up on her her chronic shoulder pain. She received a cortisone injection on 06/20/2019. Patient states that the last injection did not help. She has been receiving acupuncture as well and states that it has not helped. She takes Advil only as needed, but does not seem like it helps.  Having difficulty playing her violin with pain mostly along the anterior aspect of her shoulder.  MRI scan in June demonstrated moderate tendinosis of the supraspinatus tendon and mild tendinosis of the infraspinatus tendon.  There was partial-thickness cartilage loss with areas of high-grade partial-thickness cartilage loss of the humeral head and to a lesser extent the glenoid.  There was pathology within the biceps tendon.  Several months status post ORIF of a complex ankle fracture by Dr. Sharol Given and doing well  HPI  Review of Systems   Objective: Vital Signs: BP (!) 159/79   Pulse 77   Ht 5' 6.5" (1.689 m)   Wt 131 lb (  59.4 kg)   LMP 11/14/1992   BMI 20.83 kg/m   Physical Exam Constitutional:      Appearance: She is well-developed.  Eyes:     Pupils: Pupils are equal, round, and reactive to light.  Pulmonary:     Effort: Pulmonary effort is normal.  Skin:    General: Skin is warm and dry.  Neurological:     Mental Status: She is alert and oriented to person, place, and time.  Psychiatric:        Behavior: Behavior normal.     Ortho Exam doing well in regards to the ORIF of her ankle fracture not using any ambulatory aid.  Does wear a support stocking for swelling.  Full overhead motion of her left  shoulder with positive impingement on the extreme of external rotation.  Negative empty can testing.  Markedly positive Speed sign.  Biceps intact.  Some pain along the biceps tendon.  No crepitation.  Skin intact good grip and release Specialty Comments:  No specialty comments available.  Imaging: Mm 3d Screen Breast Bilateral  Result Date: 08/07/2019 CLINICAL DATA:  Screening. EXAM: DIGITAL SCREENING BILATERAL MAMMOGRAM WITH TOMO AND CAD COMPARISON:  Previous exam(s). ACR Breast Density Category b: There are scattered areas of fibroglandular density. FINDINGS: There are no findings suspicious for malignancy. Images were processed with CAD. IMPRESSION: No mammographic evidence of malignancy. A result letter of this screening mammogram will be mailed directly to the patient. RECOMMENDATION: Screening mammogram in one year. (Code:SM-B-01Y) BI-RADS CATEGORY  1: Negative. Electronically Signed   By: Dorise Bullion III M.D   On: 08/07/2019 16:24     PMFS History: Patient Active Problem List   Diagnosis Date Noted  . Ankle dislocation, right, initial encounter 03/10/2019  . Trimalleolar fracture of ankle, closed, right, initial encounter   . Chronic left shoulder pain 02/19/2019  . Anxiety 12/17/2016  . 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 Osteopenia 12/17/2016  . Paroxysmal atrial fibrillation ([facility]) 12/17/2016  . Pure hypercholesterolemia 12/17/2016  . Rectal cancer ([facility]) 07/30/2015   Past Medical History:  Diagnosis Date  . 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 Arthritis    "hands, fingers, back" (02/27/2014)  . Atrial fibrillation ([facility])   . Cat scratch of right hand 02/24/2014  . Dysrhythmia    a fib  . Fall 01/2018  . Fall from horse 01/2018  . Fracture, ankle 02/2019   Right   . High cholesterol   . History of stomach ulcers ~ 1966  . Osteopenia     Family History  Problem Relation Age of Onset  . Breast cancer Maternal Aunt   .  Breast cancer Maternal Grandmother   . Heart disease Mother   . Stroke Father   . Rectal cancer Paternal Grandmother   . Breast cancer Sister 60  . Breast cancer Sister 58    Past Surgical History:  Procedure Laterality Date  . ANUS SURGERY  2005   "biopsy"  . BREAST CYST EXCISION  1966  . DILATION AND CURETTAGE OF UTERUS  1980's   S/P miscarriage  . EXTERNAL FIXATION LEG Right 03/10/2019   Procedure: OPEN REDUCTION INTERNAL FIXATION RIGHT ANKLE;  Surgeon: Marchia Bond, MD;  Location: [facility];  Service: Orthopedics;  Laterality: Right;  Marland Kitchen VARICOSE VEIN SURGERY Left    left leg   Social History   Occupational History  . Not on file  Tobacco Use  . Smoking status: Former Smoker  Packs/day: 1.00    Years: 30.00    Pack years: 30.00    Types: Cigarettes  . Smokeless tobacco: Never Used  Substance and Sexual Activity  . Alcohol use: No  . Drug use: No  . Sexual activity: Yes    Partners: Male    Birth control/protection: Post-menopausal

## 2019-08-20 ENCOUNTER — Ambulatory Visit (INDEPENDENT_AMBULATORY_CARE_PROVIDER_SITE_OTHER): Payer: Medicare Other | Admitting: Orthopedic Surgery

## 2019-08-20 ENCOUNTER — Encounter: Payer: Self-pay | Admitting: Orthopedic Surgery

## 2019-08-20 VITALS — Ht 66.5 in | Wt 131.0 lb

## 2019-08-20 DIAGNOSIS — I872 Venous insufficiency (chronic) (peripheral): Secondary | ICD-10-CM | POA: Diagnosis not present

## 2019-08-21 ENCOUNTER — Encounter: Payer: Self-pay | Admitting: Orthopedic Surgery

## 2019-08-21 NOTE — Progress Notes (Signed)
Office Visit Note   Patient: Julie Mann           Date of Birth: 20-Oct-1946           MRN: [id number] Visit Date: 08/20/2019              Requested by: Lajean Manes, MD [address] [place],  [place] [address] PCP: Lajean Manes, MD  Chief Complaint  Patient presents with  . Right Ankle - Follow-up      HPI: Patient is a 73 year old woman who presents complaining of global pain around the right ankle.  She is status post injection in June.  She states she has swelling pain with walking she states the ankle is a lot better than it was she is exercising taking walks is had 2 acupuncture treatments she is looking for improvement of her ankle function and improvement of the swelling.  She states the previous injection did help.  Assessment & Plan: Visit Diagnoses:  1. Venous insufficiency of right lower extremity     Plan: Discussed that she definitely has venous stasis swelling and feel the port and treatment at this point would be to start using 20/30 compression knee-high stockings.  Recommended proprioception exercises for fascial and collagen strengthening.  Follow-Up Instructions: Return if symptoms worsen or fail to improve.   Ortho Exam  Patient is alert, oriented, no adenopathy, well-dressed, normal affect, normal respiratory effort. Examination patient has good range of motion of her ankle there is no pain to palpation anteriorly over the ankle the tendons are nontender to palpation anterior drawer stable no ligamentous laxity.  Patient does have varicose veins with venous insufficiency there are no venous ulcers there is pitting edema.  Imaging: No results found. No images are attached to the encounter.  Labs: No results found for: HGBA1C, ESRSEDRATE, CRP, LABURIC, REPTSTATUS, GRAMSTAIN, CULT, LABORGA   Lab Results  Component Value Date   ALBUMIN 4.1 03/24/2009   ALBUMIN 3.9 09/24/2008   ALBUMIN 4.2 03/24/2008    No results found for: MG  No results found for: VD25OH  No results found for: PREALBUMIN CBC EXTENDED Latest Ref Rng & Units 03/10/2019 02/05/2018 10/25/2016  WBC 4.0 - 10.5 K/uL 13.1(H) 15.7(H) 6.4  RBC 3.87 - 5.11 MIL/uL 4.70 5.17(H) 5.20(H)  HGB 12.0 - 15.0 g/dL 12.8 14.2 14.0  HCT 36.0 - 46.0 % 40.5 43.5 42.7  PLT 150 - 400 K/uL 287 372 311  NEUTROABS 1.7 - 7.7 K/uL - 13.9(H) -  LYMPHSABS 0.7 - 4.0 K/uL - 1.0 -     Body mass index is 20.83 kg/m.  Orders:  No orders of the defined types were placed in this encounter.  No orders of the defined types were placed in this encounter.    Procedures: No procedures performed  Clinical Data: No additional findings.  ROS:  All other systems negative, except as noted in the HPI. Review of Systems  Objective: Vital Signs: Ht 5' 6.5" (1.689 m)   Wt 131 lb (59.4 kg)   LMP 11/14/1992   BMI 20.83 kg/m   Specialty Comments:  No specialty comments available.  PMFS History: Patient Active Problem List   Diagnosis Date Noted  . Ankle dislocation, right, initial encounter 03/10/2019  . Trimalleolar fracture of ankle, closed, right, initial encounter   . Chronic left shoulder pain 02/19/2019  . Anxiety 12/17/2016  . 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 Osteopenia 12/17/2016  . Paroxysmal atrial fibrillation ([facility]) 12/17/2016  .  Pure hypercholesterolemia 12/17/2016  . Rectal cancer ([facility]) 07/30/2015   Past Medical History:  Diagnosis Date  . 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 Arthritis    "hands, fingers, back" (02/27/2014)  . Atrial fibrillation ([facility])   . Cat scratch of right hand 02/24/2014  . Dysrhythmia    a fib  . Fall 01/2018  . Fall from horse 01/2018  . Fracture, ankle 02/2019   Right   . High cholesterol   . History of stomach ulcers ~ 1966  . Osteopenia     Family History  Problem Relation Age of Onset  . Breast cancer Maternal Aunt   . Breast cancer Maternal Grandmother   . Heart  disease Mother   . Stroke Father   . Rectal cancer Paternal Grandmother   . Breast cancer Sister 71  . Breast cancer Sister 39    Past Surgical History:  Procedure Laterality Date  . ANUS SURGERY  2005   "biopsy"  . BREAST CYST EXCISION  1966  . DILATION AND CURETTAGE OF UTERUS  1980's   S/P miscarriage  . EXTERNAL FIXATION LEG Right 03/10/2019   Procedure: OPEN REDUCTION INTERNAL FIXATION RIGHT ANKLE;  Surgeon: Marchia Bond, MD;  Location: [facility];  Service: Orthopedics;  Laterality: Right;  Marland Kitchen VARICOSE VEIN SURGERY Left    left leg   Social History   Occupational History  . Not on file  Tobacco Use  . Smoking status: Former Smoker    Packs/day: 1.00    Years: 30.00    Pack years: 30.00    Types: Cigarettes  . Smokeless tobacco: Never Used  Substance and Sexual Activity  . Alcohol use: No  . Drug use: No  . Sexual activity: Yes    Partners: Male    Birth control/protection: Post-menopausal

## 2019-09-11 ENCOUNTER — Ambulatory Visit: Payer: Medicare Other | Admitting: Orthopaedic Surgery

## 2019-09-13 ENCOUNTER — Other Ambulatory Visit: Payer: Self-pay

## 2019-09-13 ENCOUNTER — Encounter: Payer: Self-pay | Admitting: Orthopaedic Surgery

## 2019-09-13 ENCOUNTER — Ambulatory Visit: Payer: Medicare Other | Admitting: Orthopaedic Surgery

## 2019-09-13 VITALS — Ht 66.5 in | Wt 131.0 lb

## 2019-09-13 DIAGNOSIS — G8929 Other chronic pain: Secondary | ICD-10-CM | POA: Diagnosis not present

## 2019-09-13 DIAGNOSIS — M25512 Pain in left shoulder: Secondary | ICD-10-CM

## 2019-09-13 MED ORDER — BUPIVACAINE HCL 0.5 % IJ SOLN
2.0000 mL | INTRAMUSCULAR | Status: AC | PRN
  Administered 2019-09-13: 2 mL via INTRA_ARTICULAR

## 2019-09-13 MED ORDER — METHYLPREDNISOLONE ACETATE 40 MG/ML IJ SUSP
80.0000 mg | INTRAMUSCULAR | Status: AC | PRN
  Administered 2019-09-13: 80 mg via INTRA_ARTICULAR

## 2019-09-13 MED ORDER — LIDOCAINE HCL 1 % IJ SOLN
2.0000 mL | INTRAMUSCULAR | Status: AC | PRN
  Administered 2019-09-13: 2 mL

## 2019-09-13 NOTE — Progress Notes (Signed)
Office Visit Note   Patient: Julie Mann           Date of Birth: Jun 20, 1946           MRN: [id number] Visit Date: 09/13/2019              Requested by: Lajean Manes, MD [address] [place],  [place] [address] PCP: Lajean Manes, MD   Assessment & Plan: Visit Diagnoses:  1. Chronic left shoulder pain     Plan: Oma is had a problem with her left shoulder for many months.  She had an MRI scan in June this year demonstrating some inflammatory changes of the biceps tendon and rotator cuff without obvious tearing.  She also has degenerative changes at the [facility] joint and to a lesser extent the glenohumeral joint.  Cortisone injection about a month ago made a big difference but she has had some recurrence of her pain.  I think her pain is a combination of all the above factors.  We have had a long discussion regarding treatment options including an arthroscopic debridement including an SCD DCR and possibly a biceps tenodesis or release.  I am not sure that the arthritic changes are significant enough to consider shoulder replacement.  I have encouraged her to work with exercises and stretching as she has a mild adhesive capsulitis.  I will reinject the glenohumeral joint today  Follow-Up Instructions: Return if symptoms worsen or fail to improve.   Orders:  Orders Placed This Encounter  Procedures  . Large Joint Inj: L glenohumeral   No orders of the defined types were placed in this encounter.     Procedures: Large Joint Inj: L glenohumeral on 09/13/2019 10:03 AM Details: 25 G 1.5 in needle, posterior approach  Arthrogram: No  Medications: 2 mL lidocaine 1 %; 2 mL bupivacaine 0.5 %; 80 mg methylPREDNISolone acetate 40 MG/ML Procedure, treatment alternatives, risks and benefits explained, specific risks discussed. Consent was given by the patient. Immediately prior to procedure a time out was called to verify the correct patient, procedure, equipment, support staff  and site/side marked as required. Patient was prepped and draped in the usual sterile fashion.       Clinical Data: No additional findings.   Subjective: Chief Complaint  Patient presents with  . Left Shoulder - Pain  Patient presents today for follow up on her left shoulder pain. She was last evaluated on 08/08/2019 and received a cortisone injection. Patient states that the injection helped some. She wants to talk about surgery again and her options. She is not taking anything for pain.   HPI  Review of Systems   Objective:  Vital Signs: Ht 5' 6.5" (1.689 m)   Wt 131 lb (59.4 kg)   LMP 11/14/1992   BMI 20.83 kg/m   Physical Exam Constitutional:      Appearance: She is well-developed.  Eyes:     Pupils: Pupils are equal, round, and reactive to light.  Pulmonary:     Effort: Pulmonary effort is normal.  Skin:    General: Skin is warm and dry.  Neurological:     Mental Status: She is alert and oriented to person, place, and time.  Psychiatric:        Behavior: Behavior normal.     Ortho Exam awake alert and oriented x3.  Comfortable sitting.  She lacked a few degrees to full overhead motion and external rotation compared to the right shoulder.  There was  no crepitation.  Positive Speed sign.  Mildly positive impingement testing and empty can testing.  Good grip and release.  Good motor strength.  Specialty Comments:  No specialty comments available.  Imaging: No results found.   PMFS History: Patient Active Problem List   Diagnosis Date Noted  . Ankle dislocation, right, initial encounter 03/10/2019  . Trimalleolar fracture of ankle, closed, right, initial encounter   . Chronic left shoulder pain 02/19/2019  . Anxiety 12/17/2016  . 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 Osteopenia 12/17/2016  . Paroxysmal atrial fibrillation ([place]) 12/17/2016  . Pure hypercholesterolemia 12/17/2016  . Rectal cancer ([facility]) 07/30/2015   Past Medical History:   Diagnosis Date  . 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 Arthritis    "hands, fingers, back" (02/27/2014)  . Atrial fibrillation ([facility])   . Cat scratch of right hand 02/24/2014  . Dysrhythmia    a fib  . Fall 01/2018  . Fall from horse 01/2018  . Fracture, ankle 02/2019   Right   . High cholesterol   . History of stomach ulcers ~ 1966  . Osteopenia     Family History  Problem Relation Age of Onset  . Breast cancer Maternal Aunt   . Breast cancer Maternal Grandmother   . Heart disease Mother   . Stroke Father   . Rectal cancer Paternal Grandmother   . Breast cancer Sister 39  . Breast cancer Sister 60    Past Surgical History:  Procedure Laterality Date  . ANUS SURGERY  2005   "biopsy"  . BREAST CYST EXCISION  1966  . DILATION AND CURETTAGE OF UTERUS  1980's   S/P miscarriage  . EXTERNAL FIXATION LEG Right 03/10/2019   Procedure: OPEN REDUCTION INTERNAL FIXATION RIGHT ANKLE;  Surgeon: Marchia Bond, MD;  Location: [facility];  Service: Orthopedics;  Laterality: Right;  Marland Kitchen VARICOSE VEIN SURGERY Left    left leg   Social History   Occupational History  . Not on file  Tobacco Use  . Smoking status: Former Smoker    Packs/day: 1.00    Years: 30.00    Pack years: 30.00    Types: Cigarettes  . Smokeless tobacco: Never Used  Substance and Sexual Activity  . Alcohol use: No  . Drug use: No  . Sexual activity: Yes    Partners: Male    Birth control/protection: Post-menopausal

## 2019-09-24 ENCOUNTER — Ambulatory Visit: Payer: Medicare Other | Admitting: Orthopaedic Surgery

## 2019-10-13 NOTE — Progress Notes (Signed)
Cardiology Office Note:    Date:  10/16/2019   ID:  Julie Mann, Julie Mann 1946/10/29, MRN [id number]  PCP:  Lajean Manes, MD  Cardiologist:  Sinclair Grooms, MD   Referring MD: Lajean Manes, MD   Chief Complaint  Patient presents with  . Atrial Fibrillation    History of Present Illness:    Julie Mann is a 73 y.o. female with a hx of paroxysmal atrial fibrillation and CHADS VASC score  3 (female, age) and refuses anticoagulation (Eliquis and Xarelto previously used).Julie Mann  He is doing well.  She has not had neurological complaints.  She denies excessive fatigue, dyspnea, lower extremity swelling, and syncope.  Past Medical History:  Diagnosis Date  . 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 Arthritis    "hands, fingers, back" (02/27/2014)  . Atrial fibrillation (Julie Mann)   . Cat scratch of right hand 02/24/2014  . Dysrhythmia    a fib  . Fall 01/2018  . Fall from horse 01/2018  . Fracture, ankle 02/2019   Right   . High cholesterol   . History of stomach ulcers ~ 1966  . Osteopenia     Past Surgical History:  Procedure Laterality Date  . ANUS SURGERY  2005   "biopsy"  . BREAST CYST EXCISION  1966  . DILATION AND CURETTAGE OF UTERUS  1980's   S/P miscarriage  . EXTERNAL FIXATION LEG Right 03/10/2019   Procedure: OPEN REDUCTION INTERNAL FIXATION RIGHT ANKLE;  Surgeon: Marchia Bond, MD;  Location: [facility];  Service: Orthopedics;  Laterality: Right;  Julie Mann VARICOSE VEIN SURGERY Left    left leg    Current Medications: Current Meds  Medication Sig  . aspirin EC 81 MG tablet Take 1 tablet (81 mg total) by mouth daily.  Julie Mann atorvastatin (LIPITOR) 10 MG tablet Take 10 mg by mouth 2 (two) times a week. Tuesday and Friday  . ibuprofen (ADVIL) 600 MG tablet Take 1 tablet (600 mg total) by mouth every 8 (eight) hours as needed for headache or mild pain.  Julie Mann LORazepam (ATIVAN) 1 MG tablet TAKE 1/2 TO 1 TABLET BY MOUTH EVERY DAY AS NEEDED FOR ANXIETY  .  metoprolol tartrate (LOPRESSOR) 25 MG tablet Take 25 mg by mouth daily as needed (palpitations).   . Multiple Vitamins-Minerals (MULTIVITAMIN PO) Take 1 tablet by mouth daily.  . Omega-3 Fatty Acids (FISH OIL PO) Take 5 mLs by mouth daily.  Julie Mann omeprazole (PRILOSEC) 20 MG capsule Take 20 mg by mouth daily.  Julie Mann OVER THE COUNTER MEDICATION Take 5 mLs by mouth daily. 1 teaspoon of vinegar  . OVER THE COUNTER MEDICATION Take 15 mLs by mouth 3 (three) times daily with meals. Liquid Acidophilus  . Propylene Glycol-Glycerin (SOOTHE) 0.6-0.6 % SOLN Place 1 drop into both eyes daily as needed (pollen allergies).     Allergies:   Eliquis [apixaban], Ivp dye [iodinated diagnostic agents], Doxycycline, Erythromycin, Flonase [fluticasone propionate], Keflex [cephalexin], Paxil [paroxetine hcl], Premarin [conjugated estrogens], Provera [medroxyprogesterone acetate], Xarelto [rivaroxaban], Zithromax [azithromycin], and Zoloft [sertraline hcl]   Social History   Socioeconomic History  . Marital status: Married    Spouse name: Not on file  . Number of children: Not on file  . Years of education: Not on file  . Highest education level: Not on file  Occupational History  . Not on file  Social Needs  . Financial resource strain: Not on file  . Food insecurity    Worry:  Not on file    Inability: Not on file  . Transportation needs    Medical: Not on file    Non-medical: Not on file  Tobacco Use  . Smoking status: Former Smoker    Packs/day: 1.00    Years: 30.00    Pack years: 30.00    Types: Cigarettes  . Smokeless tobacco: Never Used  Substance and Sexual Activity  . Alcohol use: No  . Drug use: No  . Sexual activity: Yes    Partners: Male    Birth control/protection: Post-menopausal  Lifestyle  . Physical activity    Days per week: Not on file    Minutes per session: Not on file  . Stress: Not on file  Relationships  . Social Herbalist on phone: Not on file    Gets together:  Not on file    Attends religious service: Not on file    Active member of club or organization: Not on file    Attends meetings of clubs or organizations: Not on file    Relationship status: Not on file  Other Topics Concern  . Not on file  Social History Narrative  . Not on file     Family History: The patient's family history includes Breast cancer in her maternal aunt and maternal grandmother; Breast cancer (age of onset: 30) in her sister; Breast cancer (age of onset: 55) in her sister; Heart disease in her mother; Rectal cancer in her paternal grandmother; Stroke in her father.  ROS:   Please see the history of present illness.    Fractured her right ankle and multiple locations May 2020.  She has not changed her mind about refusing anticoagulation therapy.  All other systems reviewed and are negative.  EKGs/Labs/Other Studies Reviewed:    The following studies were reviewed today: No new data  EKG:  EKG normal sinus rhythm but with baseline artifact.  Nonspecific T wave flattening.  The computer reading states PVCs but no PVCs are present only baseline artifact.  Recent Labs: 03/10/2019: BUN 17; Creatinine, Ser 0.87; Hemoglobin 12.8; Platelets 287; Potassium 3.8; Sodium 139  Recent Lipid Panel No results found for: CHOL, TRIG, HDL, CHOLHDL, VLDL, LDLCALC, LDLDIRECT  Physical Exam:    VS:  BP (!) 148/72   Pulse 79   Ht 5' 6.5" (1.689 m)   Wt 137 lb 12.8 oz (62.5 kg)   LMP 11/14/1992   SpO2 99%   BMI 21.91 kg/m     Wt Readings from Last 3 Encounters:  10/16/19 137 lb 12.8 oz (62.5 kg)  09/13/19 131 lb (59.4 kg)  08/20/19 131 lb (59.4 kg)     GEN: Slender. No acute distress HEENT: Normal NECK: No JVD. LYMPHATICS: No lymphadenopathy CARDIAC:  RRR without murmur, gallop, or edema. VASCULAR:  Normal Pulses. No bruits. RESPIRATORY:  Clear to auscultation without rales, wheezing or rhonchi  ABDOMEN: Soft, non-tender, non-distended, No pulsatile mass,  MUSCULOSKELETAL: No deformity  SKIN: Warm and dry NEUROLOGIC:  Alert and oriented x 3 PSYCHIATRIC:  Normal affect   ASSESSMENT:    1. Paroxysmal atrial fibrillation (HCC)   2. Pure hypercholesterolemia   3. Chronic anticoagulation   4. Anxiety   5. Educated about COVID-19 virus infection    PLAN:    In order of problems listed above:  1. Asymptomatic.  Does not feel she has had very many episodes at all over the past year.  She has refused anticoagulation therapy for multiple reasons.  Cost  and other poorly characterized side effects are blamed.  She still feels the same way.  Her CV risk score is 3. 2. LDL cholesterol is 127.  This should probably be treated with a more intense dose of atorvastatin.  Will allow primary care, Dr. Felipa Eth to consider the risk-benefit of that approach. 3. Refuses chronic anticoagulation as mentioned above. 4. Not discussed 5. The 3W's were discussed, has been adopted, and hope is to avoid COVID-19 infection.   Medication Adjustments/Labs and Tests Ordered: Current medicines are reviewed at length with the patient today.  Concerns regarding medicines are outlined above.  Orders Placed This Encounter  Procedures  . EKG 12-Lead   No orders of the defined types were placed in this encounter.   Patient Instructions  Medication Instructions:  Your physician recommends that you continue on your current medications as directed. Please refer to the Current Medication list given to you today.  *If you need a refill on your cardiac medications before your next appointment, please call your pharmacy*  Lab Work: None If you have labs (blood work) drawn today and your tests are completely normal, you will receive your results only by: Julie Mann MyChart Message (if you have MyChart) OR . A paper copy in the mail If you have any lab test that is abnormal or we need to change your treatment, we will call you to review the results.  Testing/Procedures: None   Follow-Up: At [facility], you and your health needs are our priority.  As part of our continuing mission to provide you with exceptional heart care, we have created designated Provider Care Teams.  These Care Teams include your primary Cardiologist (physician) and Advanced Practice Providers (APPs -  Physician Assistants and Nurse Practitioners) who all work together to provide you with the care you need, when you need it.  Your next appointment:   12 month(s)  The format for your next appointment:   In Person  Provider:   You may see Sinclair Grooms, MD or one of the following Advanced Practice Providers on your designated Care Team:    Truitt Merle, NP  Cecilie Kicks, NP  Kathyrn Drown, NP   Other Instructions      Signed, Sinclair Grooms, MD  10/16/2019 10:56 AM    [place]

## 2019-10-16 ENCOUNTER — Ambulatory Visit: Payer: Medicare Other | Admitting: Interventional Cardiology

## 2019-10-16 ENCOUNTER — Encounter: Payer: Self-pay | Admitting: Interventional Cardiology

## 2019-10-16 ENCOUNTER — Other Ambulatory Visit: Payer: Self-pay

## 2019-10-16 VITALS — BP 148/72 | HR 79 | Ht 66.5 in | Wt 137.8 lb

## 2019-10-16 DIAGNOSIS — F419 Anxiety disorder, unspecified: Secondary | ICD-10-CM | POA: Diagnosis not present

## 2019-10-16 DIAGNOSIS — E7800 Pure hypercholesterolemia, unspecified: Secondary | ICD-10-CM | POA: Diagnosis not present

## 2019-10-16 DIAGNOSIS — I480 Paroxysmal atrial fibrillation: Secondary | ICD-10-CM

## 2019-10-16 DIAGNOSIS — Z7189 Other specified counseling: Secondary | ICD-10-CM

## 2019-10-16 DIAGNOSIS — Z7901 Long term (current) use of anticoagulants: Secondary | ICD-10-CM

## 2019-10-16 NOTE — Patient Instructions (Signed)

## 2019-10-21 ENCOUNTER — Other Ambulatory Visit: Payer: Self-pay | Admitting: Obstetrics & Gynecology

## 2019-10-21 NOTE — Telephone Encounter (Signed)
Medication refill request: ativan  Last AEX:  07-25-2019 SM  Next AEX: 09-18-20 Last MMG (if hormonal medication request): n/a Refill authorized: Today, please advise.   Medication pended for #30, 0RF. Please refill if appropriate.

## 2019-10-31 ENCOUNTER — Other Ambulatory Visit: Payer: Self-pay

## 2019-10-31 ENCOUNTER — Ambulatory Visit: Payer: Medicare Other | Admitting: Orthopaedic Surgery

## 2019-10-31 ENCOUNTER — Encounter: Payer: Self-pay | Admitting: Orthopaedic Surgery

## 2019-10-31 VITALS — Ht 67.0 in | Wt 137.0 lb

## 2019-10-31 DIAGNOSIS — G8929 Other chronic pain: Secondary | ICD-10-CM | POA: Diagnosis not present

## 2019-10-31 DIAGNOSIS — M25512 Pain in left shoulder: Secondary | ICD-10-CM

## 2019-10-31 MED ORDER — LIDOCAINE HCL 1 % IJ SOLN
2.0000 mL | INTRAMUSCULAR | Status: AC | PRN
  Administered 2019-10-31: 2 mL

## 2019-10-31 MED ORDER — METHYLPREDNISOLONE ACETATE 40 MG/ML IJ SUSP
80.0000 mg | INTRAMUSCULAR | Status: AC | PRN
  Administered 2019-10-31: 80 mg via INTRA_ARTICULAR

## 2019-10-31 MED ORDER — BUPIVACAINE HCL 0.5 % IJ SOLN
2.0000 mL | INTRAMUSCULAR | Status: AC | PRN
  Administered 2019-10-31: 2 mL via INTRA_ARTICULAR

## 2019-10-31 NOTE — Progress Notes (Signed)
Office Visit Note   Patient: Julie Mann           Date of Birth: 15-Dec-1945           MRN: [id number] Visit Date: 10/31/2019              Requested by: Lajean Manes, MD [address] [place],  [place] [address] PCP: Lajean Manes, MD   Assessment & Plan: Visit Diagnoses:  1. Chronic left shoulder pain     Plan: Shristi has had a prior MRI scan of her left shoulder performed in June.  This demonstrated partial-thickness cartilage loss with areas of high-grade partial-thickness cartilage loss in the humeral head and to a lesser extent the glenoid.  She also had tendinosis versus interstitial tearing of the long head of the biceps.  There was moderate tendinosis of the supraspinatus tendon with fraying along the bursal surface.  In addition she had clinical adhesive capsulitis which she seems to have "worked out".  I last injected her shoulder and October which helped.  She has been using CBD oil which also "helps".  She still little frustrated in terms of her pain despite the fact that she is all the better.  I think the majority of her pain is probably related to the biceps tendon and not the glenohumeral osteoarthritis.  We will repeat the injection over the biceps tendon and the joint.  Have her start physical therapy and then reevaluate over the next 4 to 6 weeks.  The next step would be an arthroscopic SCD DCR and biceps release or tenodesis  Follow-Up Instructions: Return if symptoms worsen or fail to improve.   Orders:  Orders Placed This Encounter  Procedures  . Large Joint Inj: L glenohumeral  . Ambulatory referral to Physical Therapy   No orders of the defined types were placed in this encounter.     Procedures: Large Joint Inj: L glenohumeral on 10/31/2019 3:24 PM Details: 25 G 1.5 in needle, posterior approach  Arthrogram: No  Medications: 2 mL lidocaine 1 %; 2 mL bupivacaine 0.5 %; 80 mg methylPREDNISolone acetate 40 MG/ML  Injected glenohumeral  joint and biceps tendon Procedure, treatment alternatives, risks and benefits explained, specific risks discussed. Consent was given by the patient. Immediately prior to procedure a time out was called to verify the correct patient, procedure, equipment, support staff and site/side marked as required. Patient was prepped and draped in the usual sterile fashion.       Clinical Data: No additional findings.   Subjective: Chief Complaint  Patient presents with  . Left Shoulder - Pain  Patient presents today for left shoulder pain. She was last here on 09/13/2019 and received a cortisone injection. Patient states that the injection helped, but it still hurts. She wants to talk about alternative treatments. She is not taking anything for pain.  HPI  Review of Systems  Constitutional: Negative for fatigue.  HENT: Negative for ear pain.   Eyes: Negative for pain.  Respiratory: Negative for shortness of breath.   Cardiovascular: Negative for leg swelling.  Gastrointestinal: Negative for constipation and diarrhea.  Endocrine: Negative for cold intolerance and heat intolerance.  Genitourinary: Negative for difficulty urinating.  Musculoskeletal: Negative for joint swelling.  Skin: Negative for rash.  Allergic/Immunologic: Negative for food allergies.  Neurological: Negative for weakness.  Hematological: Does not bruise/bleed easily.  Psychiatric/Behavioral: Positive for sleep disturbance.     Objective: Vital Signs: Ht 5\' 7"  (1.702 m)   Wt  137 lb (62.1 kg)   LMP 11/14/1992   BMI 21.46 kg/m   Physical Exam Constitutional:      Appearance: She is well-developed.  Eyes:     Pupils: Pupils are equal, round, and reactive to light.  Pulmonary:     Effort: Pulmonary effort is normal.  Skin:    General: Skin is warm and dry.  Neurological:     Mental Status: She is alert and oriented to person, place, and time.  Psychiatric:        Behavior: Behavior normal.     Ortho Exam  very minimal loss of overhead flexion left shoulder and abduction.  Definitely positive Speed sign and pain over the biceps tendon.  No loss of motion with internal and external rotation.  Good grip and release.  Biceps muscle appears to be in normal position.  Mild impingement symptoms  Specialty Comments:  No specialty comments available.  Imaging: No results found.   PMFS History: Patient Active Problem List   Diagnosis Date Noted  . Ankle dislocation, right, initial encounter 03/10/2019  . Trimalleolar fracture of ankle, closed, right, initial encounter   . Chronic left shoulder pain 02/19/2019  . Anxiety 12/17/2016  . 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[place]) 12/17/2016  . Osteopenia 12/17/2016  . Paroxysmal atrial fibrillation ([place]) 12/17/2016  . Pure hypercholesterolemia 12/17/2016  . Rectal cancer ([place]) 07/30/2015   Past Medical History:  Diagnosis Date  . 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 Arthritis    "hands, fingers, back" (02/27/2014)  . Atrial fibrillation ([place])   . Cat scratch of right hand 02/24/2014  . Dysrhythmia    a fib  . Fall 01/2018  . Fall from horse 01/2018  . Fracture, ankle 02/2019   Right   . High cholesterol   . History of stomach ulcers ~ 1966  . Osteopenia     Family History  Problem Relation Age of Onset  . Breast cancer Maternal Aunt   . Breast cancer Maternal Grandmother   . Heart disease Mother   . Stroke Father   . Rectal cancer Paternal Grandmother   . Breast cancer Sister 52  . Breast cancer Sister 55    Past Surgical History:  Procedure Laterality Date  . ANUS SURGERY  2005   "biopsy"  . BREAST CYST EXCISION  1966  . DILATION AND CURETTAGE OF UTERUS  1980's   S/P miscarriage  . EXTERNAL FIXATION LEG Right 03/10/2019   Procedure: OPEN REDUCTION INTERNAL FIXATION RIGHT ANKLE;  Surgeon: Marchia Bond, MD;  Location: [facility];  Service: Orthopedics;  Laterality: Right;  Marland Kitchen VARICOSE VEIN SURGERY Left    left  leg   Social History   Occupational History  . Not on file  Tobacco Use  . Smoking status: Former Smoker    Packs/day: 1.00    Years: 30.00    Pack years: 30.00    Types: Cigarettes  . Smokeless tobacco: Never Used  Substance and Sexual Activity  . Alcohol use: No  . Drug use: No  . Sexual activity: Yes    Partners: Male    Birth control/protection: Post-menopausal

## 2019-11-26 ENCOUNTER — Ambulatory Visit: Payer: Medicare Other | Admitting: Physical Therapy

## 2019-11-26 ENCOUNTER — Other Ambulatory Visit: Payer: Self-pay

## 2019-11-26 DIAGNOSIS — M25512 Pain in left shoulder: Secondary | ICD-10-CM

## 2019-11-26 DIAGNOSIS — M25612 Stiffness of left shoulder, not elsewhere classified: Secondary | ICD-10-CM | POA: Diagnosis not present

## 2019-11-26 DIAGNOSIS — G8929 Other chronic pain: Secondary | ICD-10-CM | POA: Diagnosis not present

## 2019-11-26 DIAGNOSIS — M6281 Muscle weakness (generalized): Secondary | ICD-10-CM | POA: Diagnosis not present

## 2019-11-26 NOTE — Patient Instructions (Signed)
Access Code: [email or web address]  URL: [email or web address]  Date: 11/26/2019  Prepared by: Elsie Ra   Exercises  Standing Shoulder Posterior Capsule Stretch - 10 reps - 3 sets - 2x daily - 6x weekly  Bicep Stretch at Table - 3 reps - 1 sets - 30 hold - 2x daily - 6x weekly  Standing Eccentric Bicep Curl Pronated then Supinated - 10 reps - 1-2 sets - 2x daily - 6x weekly  Standing Shoulder Flexion Wall Slide - 10 reps - 3 sets - 2x daily - 6x weekly  Standing Shoulder Abduction Slides at Wall - 10 reps - 3 sets - 2x daily - 6x weekly  Standing Shoulder Row with Anchored Resistance - 10 reps - 3 sets - 2x daily - 6x weekly  Shoulder Extension with Resistance - Neutral - 10 reps - 3 sets - 2x daily - 6x weekly  Shoulder External Rotation with Anchored Resistance - 10 reps - 3 sets - 2x daily - 6x weekly
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
## 2019-12-11 ENCOUNTER — Other Ambulatory Visit: Payer: Self-pay

## 2019-12-11 ENCOUNTER — Ambulatory Visit: Payer: Medicare Other | Admitting: Physical Therapy

## 2019-12-11 DIAGNOSIS — M25512 Pain in left shoulder: Secondary | ICD-10-CM

## 2019-12-11 DIAGNOSIS — M25612 Stiffness of left shoulder, not elsewhere classified: Secondary | ICD-10-CM | POA: Diagnosis not present

## 2019-12-11 DIAGNOSIS — G8929 Other chronic pain: Secondary | ICD-10-CM | POA: Diagnosis not present

## 2019-12-11 DIAGNOSIS — M6281 Muscle weakness (generalized): Secondary | ICD-10-CM

## 2019-12-11 NOTE — Patient Instructions (Signed)
Access Code: [email or web address]  URL: [email or web address]  Date: 12/11/2019  Prepared by: Elsie Ra   Exercises  Standing Shoulder Posterior Capsule Stretch - 10 reps - 3 sets - 2x daily - 6x weekly  Bicep Stretch at Table - 3 reps - 1 sets - 30 hold - 2x daily - 6x weekly  Supine Shoulder Flexion with Dowel - 10 reps - 1-2 sets - 2x daily - 6x weekly  Standing Eccentric Bicep Curl Pronated then Supinated - 10 reps - 1-2 sets - 2x daily - 6x weekly  Supine Shoulder Abduction AAROM with Dowel - 10 reps - 3 sets - 2x daily - 6x weekly  Standing Shoulder Row with Anchored Resistance - 10 reps - 3 sets - 2x daily - 6x weekly  Shoulder Extension with Resistance - Neutral - 10 reps - 3 sets - 2x daily - 6x weekly  Shoulder External Rotation with Anchored Resistance - 10 reps - 3 sets - 2x daily - 6x weekly  Standing Ankle Dorsiflexion Stretch on Chair - 10 reps - 1 sets - 10 hold - 2x daily - 6x weekly  Patient Education  TENS Therapy

## 2019-12-11 NOTE — Therapy (Signed)
[place], [place], [address] Phone: [phone]   Fax:  [phone]  Physical Therapy Treatment  Patient Details  Name: Julie Mann MRN: [id number] Date of Birth: 1946/01/06 Referring Provider (PT): Joni Fears, MD   Encounter Date: 12/11/2019  PT End of Session - 12/11/19 1422    Visit Number  2    Number of Visits  12    Date for PT Re-Evaluation  01/07/20    Authorization Type  UHC MCR    PT Start Time  [id number]    PT Stop Time  1405    PT Time Calculation (min)  50 min    Activity Tolerance  Patient tolerated treatment well    Behavior During Therapy  [facility] for tasks assessed/performed       Past Medical History:  Diagnosis Date  . 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 Arthritis    "hands, fingers, back" (02/27/2014)  . Atrial fibrillation ([place])   . Cat scratch of right hand 02/24/2014  . Dysrhythmia    a fib  . Fall 01/2018  . Fall from horse 01/2018  . Fracture, ankle 02/2019   Right   . High cholesterol   . History of stomach ulcers ~ 1966  . Osteopenia     Past Surgical History:  Procedure Laterality Date  . ANUS SURGERY  2005   "biopsy"  . BREAST CYST EXCISION  1966  . DILATION AND CURETTAGE OF UTERUS  1980's   S/P miscarriage  . EXTERNAL FIXATION LEG Right 03/10/2019   Procedure: OPEN REDUCTION INTERNAL FIXATION RIGHT ANKLE;  Surgeon: Marchia Bond, MD;  Location: [facility];  Service: Orthopedics;  Laterality: Right;  Marland Kitchen VARICOSE VEIN SURGERY Left    left leg    There were no vitals filed for this visit.  Subjective Assessment - 12/11/19 1420    Subjective  she relays her shoulder is feeling worse about 7/10 pain, she has been playing the violin more which causes her Lt shoulder to hurt when she gets through, denies pain during playing the violin. Says the wall slide stretches really bother her.    Pertinent History  PMH: osteopenia, Afib, rectal Ca    Limitations   Lifting;House hold activities    Diagnostic tests  MRI shows no tears but tendonitits,    Patient Stated Goals  reduce the pain    Pain Onset  More than a month ago                       [facility] Adult PT Treatment/Exercise - 12/11/19 0001      Exercises   Exercises  Shoulder      Shoulder Exercises: Supine   Flexion  15 reps;AAROM    ABduction  AAROM;15 reps      Shoulder Exercises: Standing   External Rotation  Left;10 reps    Theraband Level (Shoulder External Rotation)  Level 2 (Red)    Extension  15 reps    Theraband Level (Shoulder Extension)  Level 2 (Red)    Row  20 reps    Theraband Level (Shoulder Row)  Level 2 (Red)      Modalities   Modalities  Electrical Stimulation;Moist Heat;Iontophoresis      Moist Heat Therapy   Number Minutes Moist Heat  15 Minutes    Moist Heat Location  Shoulder      Electrical Stimulation   Electrical Stimulation Location  Lt shoulder    Electrical Stimulation Action  IFC    Electrical Stimulation Parameters  tolerance    Electrical Stimulation Goals  Pain      Iontophoresis   Type of Iontophoresis  Dexamethasone    Location  Lt shoulder    Dose  1.0 CC    Time  4-6 hour wear home patch             PT Education - 12/11/19 1422    Education Details  HEP revision, TENS, instructions to perform exercises more gentle and not to push into pain ranges    Person(s) Educated  Patient    Methods  Explanation;Demonstration;Verbal cues;Handout    Comprehension  Verbalized understanding;Returned demonstration          PT Long Term Goals - 11/26/19 1206      PT LONG TERM GOAL #1   Title  Pt will be I and compliant with HEP. (Target for all goals 6 weeks 01/07/20)    Status  New      PT LONG TERM GOAL #2   Title  Pt will improve Lt shoulder AROM to [facility] (at least 150 deg flexion/scaption)    Status  New      PT LONG TERM GOAL #3   Title  Pt will improve Lt shoulder strength to 5/5 MMT all planes to improve  function.    Status  New      PT LONG TERM GOAL #4   Title  Pt will report less than 3/10 overall pain with ususal activity and playing violin.    Status  New            Plan - 12/11/19 1423    Clinical Impression Statement  Pt had more pain and irritation in her shoulder but was pushing into pain ranges with HEP as well as playing violin more. She was instructed to keep exercises in more gentle pain free ROM. Her stretches were also modifed to supine so she does not have stretch up against gravity. She was trialed with modalaties today to decrease overall pain and inflammaiton including Ionto and TENS.    Personal Factors and Comorbidities  Comorbidity 3+    Comorbidities  PMH: osteopenia, Afib, rectal Ca    Examination-Activity Limitations  Reach Overhead;Carry;Sleep    Examination-Participation Restrictions  Cleaning;Laundry    Stability/Clinical Decision Making  Evolving/Moderate complexity    Rehab Potential  Good    PT Frequency  2x / week    PT Duration  6 weeks    PT Treatment/Interventions  ADLs/Self Care Home Management;Cryotherapy;Iontophoresis 4mg /ml Dexamethasone;Electrical Stimulation;Moist Heat;Ultrasound;Therapeutic activities;Therapeutic exercise;Neuromuscular re-education;Manual techniques;Passive range of motion;Taping;Joint Manipulations   has history of rectal Cancer, but no active Cancer   PT Next Visit Plan  how was Ionto and TENS?  needs stretching into flexion and abduction, postural corrective strength and RTC strength. Consider modalaties for pain PRN (has history of rectal Ca, but no active Ca)    PT Home Exercise Plan  Access Code: [email or web address]       Patient will benefit from skilled therapeutic intervention in order to improve the following deficits and impairments:  Decreased activity tolerance, Decreased range of motion, Decreased strength, Increased fascial restricitons, Postural dysfunction  Visit Diagnosis: Chronic left shoulder pain  Stiffness of  left shoulder, not elsewhere classified  Muscle weakness (generalized)     Problem List Patient Active Problem List   Diagnosis Date Noted  . Ankle dislocation, right, initial encounter 03/10/2019  . Trimalleolar  fracture of ankle, closed, right, initial encounter   . Chronic left shoulder pain 02/19/2019  . Anxiety 12/17/2016  . 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[place]) 12/17/2016  . Osteopenia 12/17/2016  . Paroxysmal atrial fibrillation ([place]) 12/17/2016  . Pure hypercholesterolemia 12/17/2016  . Rectal cancer [facility]) 07/30/2015    Silvestre Mesi 12/11/2019, 2:36 PM  [facility] Physical Therapy [address] [place], [place], [address] Phone: [phone]   Fax:  [phone]  Name: Julie Mann MRN: [id number] Date of Birth: November 04, 1946

## 2019-12-16 ENCOUNTER — Ambulatory Visit: Payer: Medicare Other | Admitting: Physical Therapy

## 2019-12-16 ENCOUNTER — Other Ambulatory Visit: Payer: Self-pay

## 2019-12-16 DIAGNOSIS — M25512 Pain in left shoulder: Secondary | ICD-10-CM

## 2019-12-16 DIAGNOSIS — M25612 Stiffness of left shoulder, not elsewhere classified: Secondary | ICD-10-CM | POA: Diagnosis not present

## 2019-12-16 DIAGNOSIS — M6281 Muscle weakness (generalized): Secondary | ICD-10-CM

## 2019-12-16 DIAGNOSIS — G8929 Other chronic pain: Secondary | ICD-10-CM | POA: Diagnosis not present

## 2019-12-16 NOTE — Patient Instructions (Signed)
Access Code: [email or web address]  URL: [email or web address]  Date: 12/16/2019  Prepared by: Elsie Ra   Exercises  Bicep Stretch at Table - 3 reps - 1 sets - 30 hold - 3x weekly  Supine Shoulder Flexion with Dowel - 10 reps - 1-2 sets - 1x daily - 3x weekly  Seated Shoulder Abduction Towel Slide at Table Top - 10 reps - 1-2 sets - 5 hold - 1x daily - 2x weekly  Standing Eccentric Bicep Curl Pronated then Supinated - 10 reps - 1-2 sets - 1x daily - 3x weekly  Prone Shoulder Row - 10 reps - 1-2 sets - 1x daily - 3x weekly  Prone Shoulder Extension - Single Arm - 10 reps - 1-2 sets - 1x daily - 3x weekly  Sidelying Shoulder External Rotation - 10 reps - 1-2 sets - 1x daily - 3x weekly  Standing Ankle Dorsiflexion Stretch on Chair - 10 reps - 1 sets - 10 hold - 2x daily - 6x weekly

## 2019-12-16 NOTE — Therapy (Signed)
[place], [place], [address] Phone: [phone]   Fax:  [phone]  Physical Therapy Treatment  Patient Details  Name: Julie Mann MRN: [id number] Date of Birth: 1946-03-08 Referring Provider (PT): Joni Fears, MD   Encounter Date: 12/16/2019  PT End of Session - 12/16/19 1121    Visit Number  3    Number of Visits  12    Date for PT Re-Evaluation  01/07/20    Authorization Type  UHC MCR    PT Start Time  1017    PT Stop Time  1100    PT Time Calculation (min)  43 min    Activity Tolerance  Patient tolerated treatment well    Behavior During Therapy  [facility] for tasks assessed/performed       Past Medical History:  Diagnosis Date  . 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 Arthritis    "hands, fingers, back" (02/27/2014)  . Atrial fibrillation ([place])   . Cat scratch of right hand 02/24/2014  . Dysrhythmia    a fib  . Fall 01/2018  . Fall from horse 01/2018  . Fracture, ankle 02/2019   Right   . High cholesterol   . History of stomach ulcers ~ 1966  . Osteopenia     Past Surgical History:  Procedure Laterality Date  . ANUS SURGERY  2005   "biopsy"  . BREAST CYST EXCISION  1966  . DILATION AND CURETTAGE OF UTERUS  1980's   S/P miscarriage  . EXTERNAL FIXATION LEG Right 03/10/2019   Procedure: OPEN REDUCTION INTERNAL FIXATION RIGHT ANKLE;  Surgeon: Marchia Bond, MD;  Location: [facility];  Service: Orthopedics;  Laterality: Right;  Marland Kitchen VARICOSE VEIN SURGERY Left    left leg    There were no vitals filed for this visit.  Subjective Assessment - 12/16/19 1115    Subjective  She relays overall 5/10 pain in her Lt shoulder. She says some of the exercises hurt including the standing rows, extensions, and ER. She did not notice much difference with Ionto patch but likes the TENS and now has one for home    Pertinent History  PMH: osteopenia, Afib, rectal Ca    Limitations   Lifting;House hold activities    Diagnostic tests  MRI shows no tears but tendonitits,    Patient Stated Goals  reduce the pain    Pain Onset  More than a month ago         [facility] Adult PT Treatment/Exercise - 12/16/19 0001      Shoulder Exercises: Supine   Flexion  15 reps;AAROM    ABduction Limitations  casued pain, backed off in ROM and still with complaints to stopped and switched to tableslide scaption stretch with good toelrance for 5 sec hold X 10 reps      Shoulder Exercises: Prone   Extension  Left;10 reps    Other Prone Exercises  Row on Lt X 10 reps      Shoulder Exercises: Sidelying   External Rotation  Left;10 reps      Modalities   Modalities  Electrical Stimulation;Moist Heat;Iontophoresis      Moist Heat Therapy   Number Minutes Moist Heat  15 Minutes    Moist Heat Location  Shoulder      Electrical Stimulation   Electrical Stimulation Location  Lt shoulder    Electrical Stimulation Action  IFC    Electrical Stimulation Parameters  tolerance  Electrical Stimulation Goals  Pain      Manual Therapy   Manual therapy comments  Lt shoulder PROM gently and gentle Garner mobs for distraction, inferior glides, A-P glides grade 1-2             PT Education - 12/16/19 1121    Education Details  HEP revision for more gentle exercises and she was instructed to only do them 3 times a week and to keep in pain free ROM    Person(s) Educated  Patient    Methods  Explanation;Demonstration;Verbal cues;Handout    Comprehension  Verbalized understanding;Returned demonstration;Need further instruction          PT Long Term Goals - 11/26/19 1206      PT LONG TERM GOAL #1   Title  Pt will be I and compliant with HEP. (Target for all goals 6 weeks 01/07/20)    Status  New      PT LONG TERM GOAL #2   Title  Pt will improve Lt shoulder AROM to [facility] (at least 150 deg flexion/scaption)    Status  New      PT LONG TERM GOAL #3   Title  Pt will improve Lt shoulder  strength to 5/5 MMT all planes to improve function.    Status  New      PT LONG TERM GOAL #4   Title  Pt will report less than 3/10 overall pain with ususal activity and playing violin.    Status  New            Plan - 12/16/19 1121    Clinical Impression Statement  her shoulder again appears to be flared up as she was finishing a work project where she had to play violin a lot plus was pushing through pain with her resisted exercises. PT modified these to no resistance but moving through gravity in prone or Sidelying to still work on light strengthening. Also supine abduction AAROM bothered her so changed this one to tableslide and she was able to perform these modifications without complaints. PT revised her HEP printout to reflect these more gentle exercise variations and discouraged her from pushing through pain as this is likely irritating her shoulder. She was also instructed to try her exercises only 3 times per week instead of 6 days per week  She was then treated with manual therapy and TENS for overall pain reduction.    Personal Factors and Comorbidities  Comorbidity 3+    Comorbidities  PMH: osteopenia, Afib, rectal Ca    Examination-Activity Limitations  Reach Overhead;Carry;Sleep    Examination-Participation Restrictions  Cleaning;Laundry    Stability/Clinical Decision Making  Evolving/Moderate complexity    Rehab Potential  Good    PT Frequency  2x / week    PT Duration  6 weeks    PT Treatment/Interventions  ADLs/Self Care Home Management;Cryotherapy;Iontophoresis 4mg /ml Dexamethasone;Electrical Stimulation;Moist Heat;Ultrasound;Therapeutic activities;Therapeutic exercise;Neuromuscular re-education;Manual techniques;Passive range of motion;Taping;Joint Manipulations   has history of rectal Cancer, but no active Cancer   PT Next Visit Plan  needs reinforcement for education of light stretching/strength but not going into pain.  postural corrective strength and RTC strength.  Consider modalaties for pain PRN (has history of rectal Ca, but no active Ca)    PT Home Exercise Plan  Access Code: [email or web address]       Patient will benefit from skilled therapeutic intervention in order to improve the following deficits and impairments:  Decreased activity tolerance, Decreased range of motion, Decreased strength, Increased  fascial restricitons, Postural dysfunction  Visit Diagnosis: Chronic left shoulder pain  Stiffness of left shoulder, not elsewhere classified  Muscle weakness (generalized)     Problem List Patient Active Problem List   Diagnosis Date Noted  . Ankle dislocation, right, initial encounter 03/10/2019  . Trimalleolar fracture of ankle, closed, right, initial encounter   . Chronic left shoulder pain 02/19/2019  . Anxiety 12/17/2016  . 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[place]) 12/17/2016  . Osteopenia 12/17/2016  . Paroxysmal atrial fibrillation ([place]) 12/17/2016  . Pure hypercholesterolemia 12/17/2016  . Rectal cancer [facility]) 07/30/2015    Silvestre Mesi 12/16/2019, 11:47 AM  [facility] Physical Therapy [address] [place], [place], [address] Phone: [phone]   Fax:  [phone]  Name: Julie Mann MRN: [id number] Date of Birth: 05-31-1946

## 2019-12-18 ENCOUNTER — Encounter: Payer: Self-pay | Admitting: Physical Therapy

## 2019-12-18 ENCOUNTER — Ambulatory Visit: Payer: Medicare Other | Admitting: Physical Therapy

## 2019-12-18 ENCOUNTER — Other Ambulatory Visit: Payer: Self-pay

## 2019-12-18 DIAGNOSIS — M25612 Stiffness of left shoulder, not elsewhere classified: Secondary | ICD-10-CM

## 2019-12-18 DIAGNOSIS — G8929 Other chronic pain: Secondary | ICD-10-CM

## 2019-12-18 DIAGNOSIS — M25512 Pain in left shoulder: Secondary | ICD-10-CM

## 2019-12-18 DIAGNOSIS — M6281 Muscle weakness (generalized): Secondary | ICD-10-CM | POA: Diagnosis not present

## 2019-12-18 NOTE — Therapy (Addendum)
[facility] Physical Therapy [address] [place], [place], [address] Phone: [phone]   Fax:  [phone]  Physical Therapy Treatment/Discharge addendum PHYSICAL THERAPY DISCHARGE SUMMARY  Visits from Start of Care: 4  Current functional level related to goals / functional outcomes: See below   Remaining deficits: See below   Education / Equipment: HEP  Plan: Patient agrees to discharge.  Patient goals were not met. Patient is being discharged due to not returning since the last visit.  ?????  Failed conservative treatment with PT and may require surgery per MD  Elsie Ra, PT, DPT 01/21/20 2:24 PM      Patient Details  Name: Julie Mann MRN: [id number] Date of Birth: 09/19/1946 Referring Provider (PT): Joni Fears, MD   Encounter Date: 12/18/2019  PT End of Session - 12/18/19 1149    Visit Number  4    Number of Visits  12    Date for PT Re-Evaluation  01/07/20    Authorization Type  UHC MCR    PT Start Time  1100    PT Stop Time  1138    PT Time Calculation (min)  38 min    Activity Tolerance  Patient tolerated treatment well    Behavior During Therapy  [facility] for tasks assessed/performed       Past Medical History:  Diagnosis Date  . 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 Arthritis    "hands, fingers, back" (02/27/2014)  . Atrial fibrillation ([place])   . Cat scratch of right hand 02/24/2014  . Dysrhythmia    a fib  . Fall 01/2018  . Fall from horse 01/2018  . Fracture, ankle 02/2019   Right   . High cholesterol   . History of stomach ulcers ~ 1966  . Osteopenia     Past Surgical History:  Procedure Laterality Date  . ANUS SURGERY  2005   "biopsy"  . BREAST CYST EXCISION  1966  . DILATION AND CURETTAGE OF UTERUS  1980's   S/P miscarriage  . EXTERNAL FIXATION LEG Right 03/10/2019   Procedure: OPEN REDUCTION INTERNAL FIXATION RIGHT ANKLE;  Surgeon: Marchia Bond, MD;  Location: [facility];   Service: Orthopedics;  Laterality: Right;  Marland Kitchen VARICOSE VEIN SURGERY Left    left leg    There were no vitals filed for this visit.  Subjective Assessment - 12/18/19 1104    Subjective  Lt shoulder has gotten worse since starting PT.  "I think I'll just get a shot."    Pertinent History  PMH: osteopenia, Afib, rectal Ca    Limitations  Lifting;House hold activities    Diagnostic tests  MRI shows no tears but tendonitits,    Patient Stated Goals  reduce the pain    Currently in Pain?  Yes    Pain Score  4     Pain Location  Shoulder    Pain Orientation  Left;Anterior    Pain Descriptors / Indicators  Aching    Pain Type  Chronic pain    Pain Onset  More than a month ago    Pain Frequency  Constant    Aggravating Factors   reaching across, fixing her hair    Pain Relieving Factors  CBD oil, injections         [facility] PT Assessment - 12/18/19 1140      Assessment   Medical Diagnosis  Left shoulder for impingement and biceps tendonitis.    Referring Provider (PT)  Joni Fears, MD    Hand Dominance  Right      AROM   Overall AROM Comments  no significant change in ROM today      Strength   Left Shoulder Flexion  4/5    Left Shoulder ABduction  4+/5    Left Shoulder Internal Rotation  5/5    Left Shoulder External Rotation  4/5                   [facility] Adult PT Treatment/Exercise - 12/18/19 1143      Manual Therapy   Manual Therapy  Soft tissue mobilization    Soft tissue mobilization  Lt shoulder anterior deltoid into pecs       Trigger Point Dry Needling - 12/18/19 1147    Consent Given?  Yes    Education Handout Provided  No   will provide next vist; verbally reviewed   Muscles Treated Upper Quadrant  Deltoid    Deltoid Response  Twitch response elicited;Palpable increased muscle length                PT Long Term Goals - 12/18/19 1151      PT LONG TERM GOAL #1   Title  Pt will be I and compliant with HEP. (Target for all goals 6 weeks  01/07/20)    Status  On-going      PT LONG TERM GOAL #2   Title  Pt will improve Lt shoulder AROM to [facility] (at least 150 deg flexion/scaption)    Status  On-going      PT LONG TERM GOAL #3   Title  Pt will improve Lt shoulder strength to 5/5 MMT all planes to improve function.    Status  On-going      PT LONG TERM GOAL #4   Title  Pt will report less than 3/10 overall pain with ususal activity and playing violin.    Status  On-going            Plan - 12/18/19 1151    Clinical Impression Statement  Pt arrived today reporting no change or improvement in symptoms, and actually feels pain has increased in severity.  She would like for today to be her last visit; and after further assessment and discussion with pt she had active trigger points in anterior deltoid, so DN and manual therapy performed today with decreased pain following.  Recommended she keep her scheduled appts and see how she does over the next few days to determine if this has been helpful.    Personal Factors and Comorbidities  Comorbidity 3+    Comorbidities  PMH: osteopenia, Afib, rectal Ca    Examination-Activity Limitations  Reach Overhead;Carry;Sleep    Examination-Participation Restrictions  Cleaning;Laundry    Stability/Clinical Decision Making  Evolving/Moderate complexity    Rehab Potential  Good    PT Frequency  2x / week    PT Duration  6 weeks    PT Treatment/Interventions  ADLs/Self Care Home Management;Cryotherapy;Iontophoresis '4mg'$ /ml Dexamethasone;Electrical Stimulation;Moist Heat;Ultrasound;Therapeutic activities;Therapeutic exercise;Neuromuscular re-education;Manual techniques;Passive range of motion;Taping;Joint Manipulations   has history of rectal Cancer, but no active Cancer   PT Next Visit Plan  see how DN went, progress as able    PT Home Exercise Plan  Access Code: [email or web address]       Patient will benefit from skilled therapeutic intervention in order to improve the following deficits and  impairments:  Decreased activity tolerance, Decreased range of motion, Decreased strength, Increased fascial restricitons, Postural  dysfunction  Visit Diagnosis: Chronic left shoulder pain  Stiffness of left shoulder, not elsewhere classified  Muscle weakness (generalized)     Problem List Patient Active Problem List   Diagnosis Date Noted  . Ankle dislocation, right, initial encounter 03/10/2019  . Trimalleolar fracture of ankle, closed, right, initial encounter   . Chronic left shoulder pain 02/19/2019  . Anxiety 12/17/2016  . 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[place]) 12/17/2016  . Osteopenia 12/17/2016  . Paroxysmal atrial fibrillation ([place]) 12/17/2016  . Pure hypercholesterolemia 12/17/2016  . Rectal cancer ([place]) 07/30/2015      Laureen Abrahams, PT, DPT 12/18/19 11:56 AM     [facility] Physical Therapy [address] [place], [place], [address] Phone: [phone]   Fax:  [phone]  Name: MARILYN NIHISER MRN: [id number] Date of Birth: 1946-01-29

## 2019-12-23 ENCOUNTER — Encounter: Payer: Medicare Other | Admitting: Physical Therapy

## 2019-12-25 ENCOUNTER — Encounter: Payer: Medicare Other | Admitting: Physical Therapy

## 2019-12-30 ENCOUNTER — Encounter: Payer: Medicare Other | Admitting: Rehabilitative and Restorative Service Providers"

## 2019-12-31 ENCOUNTER — Ambulatory Visit: Payer: Medicare Other | Admitting: Orthopaedic Surgery

## 2019-12-31 ENCOUNTER — Encounter: Payer: Self-pay | Admitting: Orthopaedic Surgery

## 2019-12-31 ENCOUNTER — Other Ambulatory Visit: Payer: Self-pay

## 2019-12-31 VITALS — Ht 67.0 in | Wt 137.0 lb

## 2019-12-31 DIAGNOSIS — G8929 Other chronic pain: Secondary | ICD-10-CM

## 2019-12-31 DIAGNOSIS — M25512 Pain in left shoulder: Secondary | ICD-10-CM | POA: Diagnosis not present

## 2019-12-31 MED ORDER — BUPIVACAINE HCL 0.5 % IJ SOLN
2.0000 mL | INTRAMUSCULAR | Status: AC | PRN
  Administered 2019-12-31: 2 mL via INTRA_ARTICULAR

## 2019-12-31 MED ORDER — METHYLPREDNISOLONE ACETATE 40 MG/ML IJ SUSP
80.0000 mg | INTRAMUSCULAR | Status: AC | PRN
  Administered 2019-12-31: 80 mg via INTRA_ARTICULAR

## 2019-12-31 MED ORDER — LIDOCAINE HCL 2 % IJ SOLN
2.0000 mL | INTRAMUSCULAR | Status: AC | PRN
  Administered 2019-12-31: 16:00:00 2 mL

## 2019-12-31 NOTE — Progress Notes (Signed)
Office Visit Note   Patient: Julie Mann           Date of Birth: 26-Aug-1946           MRN: [id number] Visit Date: 12/31/2019              Requested by: Lajean Manes, MD [address] [place],  [place] [address] PCP: Lajean Manes, MD   Assessment & Plan: Visit Diagnoses:  1. Chronic left shoulder pain     Plan: Has been having a problem with the left shoulder for many months.  She has had an MRI scan demonstrating some fraying of the bursal surface of the supraspinatus and some tendinitis of the biceps tendon.  There was also some evidence of mild glenohumeral joint arthritis.  I have injected her shoulder on several occasions and it seems to make a difference.  She has been through a full course of therapy and felt like it did not really help and in fact may have actually aggravated her shoulder.  Her problem seems to be mostly centered around the biceps tendon.  She has a positive Speed sign and tenderness directly over the tendon.  I am concerned that she is having recurrent symptoms so quickly between her cortisone injections and have discussed surgery.  This would include an arthroscopic SCD, DCR and biceps release or tenodesis.  I really think that might make a difference.  I am sure she has some discomfort from the glenohumeral arthritis but has excellent range of motion.  She needs to think about the above and get back with me and I reinjected the area about the biceps tendon today with cortisone  Follow-Up Instructions: Return if symptoms worsen or fail to improve.   Orders:  Orders Placed This Encounter  Procedures  . Large Joint Inj: L subacromial bursa   No orders of the defined types were placed in this encounter.     Procedures: Large Joint Inj: L subacromial bursa on 12/31/2019 4:02 PM Indications: pain and diagnostic evaluation Details: 25 G 1.5 in needle, anterolateral approach  Arthrogram: No  Medications: 2 mL lidocaine 2 %; 2 mL bupivacaine  0.5 %; 80 mg methylPREDNISolone acetate 40 MG/ML Consent was given by the patient. Immediately prior to procedure a time out was called to verify the correct patient, procedure, equipment, support staff and site/side marked as required. Patient was prepped and draped in the usual sterile fashion.       Clinical Data: No additional findings.   Subjective: Chief Complaint  Patient presents with  . Left Shoulder - Pain  Patient presents today for recurrent left shoulder pain. She had her shoulder injected with cortisone on 10/31/2019. She is wanting another cortisone injection today. She takes antiinflammatories as needed. She was in therapy but stopped due to increased pain.   HPI  Review of Systems   Objective: Vital Signs: Ht 5\' 7"  (1.702 m)   Wt 137 lb (62.1 kg)   LMP 11/14/1992   BMI 21.46 kg/m   Physical Exam Constitutional:      Appearance: She is well-developed.  Eyes:     Pupils: Pupils are equal, round, and reactive to light.  Pulmonary:     Effort: Pulmonary effort is normal.  Skin:    General: Skin is warm and dry.  Neurological:     Mental Status: She is alert and oriented to person, place, and time.  Psychiatric:        Behavior: Behavior normal.  Ortho Exam awake alert and oriented x3.  Comfortable sitting.  [place] brought her file in with her to show me the position in which she places it on about the left shoulder.  She is able to play but has pain afterwards.  She does not have to flex or abduct more than about 70 degrees but beyond that it seems stable a bit of a problem.  She has regained almost all of her motion and does not not have adhesive capsulitis.  She does have pain directly over the biceps tendon with a positive Speed sign and some mild tenderness about the acromioclavicular joint.  No grinding or grating.  Minimally positive impingement testing good grip and good release.  Neurologically intact  Specialty Comments:  No specialty comments  available.  Imaging: No results found.   PMFS History: Patient Active Problem List   Diagnosis Date Noted  . Ankle dislocation, right, initial encounter 03/10/2019  . Trimalleolar fracture of ankle, closed, right, initial encounter   . Chronic left shoulder pain 02/19/2019  . Anxiety 12/17/2016  . 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[facility]) 12/17/2016  . Osteopenia 12/17/2016  . Paroxysmal atrial fibrillation ([facility]) 12/17/2016  . Pure hypercholesterolemia 12/17/2016  . Rectal cancer ([facility]) 07/30/2015   Past Medical History:  Diagnosis Date  . 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 Arthritis    "hands, fingers, back" (02/27/2014)  . Atrial fibrillation ([facility])   . Cat scratch of right hand 02/24/2014  . Dysrhythmia    a fib  . Fall 01/2018  . Fall from horse 01/2018  . Fracture, ankle 02/2019   Right   . High cholesterol   . History of stomach ulcers ~ 1966  . Osteopenia     Family History  Problem Relation Age of Onset  . Breast cancer Maternal Aunt   . Breast cancer Maternal Grandmother   . Heart disease Mother   . Stroke Father   . Rectal cancer Paternal Grandmother   . Breast cancer Sister 36  . Breast cancer Sister 54    Past Surgical History:  Procedure Laterality Date  . ANUS SURGERY  2005   "biopsy"  . BREAST CYST EXCISION  1966  . DILATION AND CURETTAGE OF UTERUS  1980's   S/P miscarriage  . EXTERNAL FIXATION LEG Right 03/10/2019   Procedure: OPEN REDUCTION INTERNAL FIXATION RIGHT ANKLE;  Surgeon: Marchia Bond, MD;  Location: [facility];  Service: Orthopedics;  Laterality: Right;  Marland Kitchen VARICOSE VEIN SURGERY Left    left leg   Social History   Occupational History  . Not on file  Tobacco Use  . Smoking status: Former Smoker    Packs/day: 1.00    Years: 30.00    Pack years: 30.00    Types: Cigarettes  . Smokeless tobacco: Never Used  Substance and Sexual Activity  . Alcohol use: No  . Drug use: No  . Sexual activity: Yes    Partners:  Male    Birth control/protection: Post-menopausal

## 2020-01-01 ENCOUNTER — Encounter: Payer: Medicare Other | Admitting: Physical Therapy

## 2020-01-06 ENCOUNTER — Encounter: Payer: Medicare Other | Admitting: Physical Therapy

## 2020-01-07 ENCOUNTER — Telehealth: Payer: Self-pay | Admitting: Orthopaedic Surgery

## 2020-01-07 NOTE — Telephone Encounter (Signed)
Patient would like to discuss surgery with you and be reassured the surgery does not relate to a tendon.  She would like the least invasive surgery and wants to know exactly what you will be doing.  She came today because she was having trouble getting through to me.  I have patient in my office now and explained you'll be back in the office next week and I had taken a message and was waiting for your return so I would have a surgery sheet with instructions that would include the surgery, location , type of anesthesia, clearance request or not, and when the surgery should take place (patient had recent cortisone injection 12-31-19)  She is hoping this can be done at the surgical center.  We are looking at a couple of dates, but will wait until you are back in the office.

## 2020-01-07 NOTE — Telephone Encounter (Signed)
fyi

## 2020-01-08 ENCOUNTER — Encounter: Payer: Medicare Other | Admitting: Physical Therapy

## 2020-01-08 NOTE — Telephone Encounter (Signed)
Thanks-remind me when I return. I did call and left a message

## 2020-01-14 NOTE — Telephone Encounter (Signed)
Just a reminder...

## 2020-01-14 NOTE — Telephone Encounter (Signed)
called

## 2020-01-29 ENCOUNTER — Encounter: Payer: Self-pay | Admitting: Orthopaedic Surgery

## 2020-01-29 ENCOUNTER — Other Ambulatory Visit: Payer: Self-pay

## 2020-01-29 ENCOUNTER — Ambulatory Visit: Payer: Medicare Other | Admitting: Orthopaedic Surgery

## 2020-01-29 VITALS — Ht 67.0 in | Wt 137.0 lb

## 2020-01-29 DIAGNOSIS — G8929 Other chronic pain: Secondary | ICD-10-CM | POA: Diagnosis not present

## 2020-01-29 DIAGNOSIS — M25512 Pain in left shoulder: Secondary | ICD-10-CM

## 2020-01-29 NOTE — Progress Notes (Signed)
Office Visit Note   Patient: Julie Mann           Date of Birth: August 16, 1946           MRN: [id number] Visit Date: 01/29/2020              Requested by: Lajean Manes, MD [address] [place],  [place] [address] PCP: Lajean Manes, MD   Assessment & Plan: Visit Diagnoses:  1. Chronic left shoulder pain     Plan: [name] came into the office today to talk about her left shoulder.  She had a prior MRI scan revealing moderate tendinosis of the supraspinatus tendon with fraying along the bursal surface and mild tendinosis of the infraspinatus tendon.  There is no fatty atrophy or fatty replacement of the rotator cuff.  There was some pathology within the biceps long head and moderate arthropathy of the [facility] joint there was a type II acromium and some degenerative changes of the glenohumeral joint.  She has been having trouble off and on for about 8 months or more and she has had several injections have made a difference both subacromially and intra-articularly.  She is did have a mild adhesive capsulitis that responded to physical therapy.  We have discussed surgery as another option because of her persistent pain and this will include an arthroscopic SCD and DCR and then some procedure on the biceps tendon.  After further discussion she would like to avoid a Popeye biceps muscle and, therefore, I would suggest doing an open biceps tenodesis and at the same time evaluate the bursal surface of the cuff particularly if there is nothing visible by arthroscopy.  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  We also discussed physical therapy and the possibility of recurrent adhesive capsulitis.  Answered all of her questions and she would like to proceed  Follow-Up Instructions: Return We will schedule surgery left shoulder.   Orders:  No orders of the defined types were placed in this encounter.  No orders of the defined types were placed in  this encounter.     Procedures: No procedures performed   Clinical Data: No additional findings.   Subjective: Chief Complaint  Patient presents with  . Left Shoulder - Follow-up  Patient presents today for a one month follow up on her left shoulder. She is wanting to discuss surgery and has a list of questions with her today.   HPI  Review of Systems   Objective: Vital Signs: Ht 5\' 7"  (1.702 m)   Wt 137 lb (62.1 kg)   LMP 11/14/1992   BMI 21.46 kg/m   Physical Exam Constitutional:      Appearance: She is well-developed.  Eyes:     Pupils: Pupils are equal, round, and reactive to light.  Pulmonary:     Effort: Pulmonary effort is normal.  Skin:    General: Skin is warm and dry.  Neurological:     Mental Status: She is alert and oriented to person, place, and time.  Psychiatric:        Behavior: Behavior normal.     Ortho Exam awake alert and oriented x3.  Comfortable sitting.  Quick overhead motion of her left shoulder but does have some mild impingement empty can testing and a positive Speed sign.  Biceps muscle appears to be intact.  Good strength Specialty Comments:  No specialty comments available.  Imaging: No results found.   [place]  History: Patient Active Problem List   Diagnosis Date Noted  . Ankle dislocation, right, initial encounter 03/10/2019  . Trimalleolar fracture of ankle, closed, right, initial encounter   . Chronic left shoulder pain 02/19/2019  . Anxiety 12/17/2016  . 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[facility]) 12/17/2016  . Osteopenia 12/17/2016  . Paroxysmal atrial fibrillation ([place]) 12/17/2016  . Pure hypercholesterolemia 12/17/2016  . Rectal cancer ([facility]) 07/30/2015   Past Medical History:  Diagnosis Date  . 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 Arthritis    "hands, fingers, back" (02/27/2014)  . Atrial fibrillation ([facility])   . Cat scratch of right hand 02/24/2014  . Dysrhythmia    a fib  . Fall 01/2018  .  Fall from horse 01/2018  . Fracture, ankle 02/2019   Right   . High cholesterol   . History of stomach ulcers ~ 1966  . Osteopenia     Family History  Problem Relation Age of Onset  . Breast cancer Maternal Aunt   . Breast cancer Maternal Grandmother   . Heart disease Mother   . Stroke Father   . Rectal cancer Paternal Grandmother   . Breast cancer Sister 50  . Breast cancer Sister 64    Past Surgical History:  Procedure Laterality Date  . ANUS SURGERY  2005   "biopsy"  . BREAST CYST EXCISION  1966  . DILATION AND CURETTAGE OF UTERUS  1980's   S/P miscarriage  . EXTERNAL FIXATION LEG Right 03/10/2019   Procedure: OPEN REDUCTION INTERNAL FIXATION RIGHT ANKLE;  Surgeon: Marchia Bond, MD;  Location: [facility];  Service: Orthopedics;  Laterality: Right;  Marland Kitchen VARICOSE VEIN SURGERY Left    left leg   Social History   Occupational History  . Not on file  Tobacco Use  . Smoking status: Former Smoker    Packs/day: 1.00    Years: 30.00    Pack years: 30.00    Types: Cigarettes  . Smokeless tobacco: Never Used  Substance and Sexual Activity  . Alcohol use: No  . Drug use: No  . Sexual activity: Yes    Partners: Male    Birth control/protection: Post-menopausal

## 2020-01-30 ENCOUNTER — Other Ambulatory Visit: Payer: Self-pay | Admitting: Obstetrics & Gynecology

## 2020-01-30 DIAGNOSIS — F419 Anxiety disorder, unspecified: Secondary | ICD-10-CM

## 2020-01-30 NOTE — Telephone Encounter (Signed)
Med refill request: Ativan Last AEX: 07/25/2019 with SM Next AEX: 09/18/2020  Last MMG (if hormonal med) 08/07/2019, Birads 1, negative Refill authorized: #[id number], 0RF, pended if approved.

## 2020-02-03 ENCOUNTER — Telehealth: Payer: Self-pay | Admitting: Orthopaedic Surgery

## 2020-02-03 NOTE — Telephone Encounter (Signed)
Patient is scheduled for left shoulder arthroscopy, sad, dcr, ?morcr, open biceps tenodesis this coming Thursday 29th of March.    The pre-op assessment nurse is asking you to advise ASAP  regarding aspirin use.    [phone] ext [phone] Michaell Cowing, RN)

## 2020-02-03 NOTE — Telephone Encounter (Signed)
Please see below and call.

## 2020-02-03 NOTE — Telephone Encounter (Signed)
Called and left message.

## 2020-02-06 ENCOUNTER — Other Ambulatory Visit: Payer: Self-pay | Admitting: Orthopedic Surgery

## 2020-02-06 DIAGNOSIS — M7541 Impingement syndrome of right shoulder: Secondary | ICD-10-CM | POA: Diagnosis not present

## 2020-02-06 DIAGNOSIS — M94211 Chondromalacia, right shoulder: Secondary | ICD-10-CM | POA: Diagnosis not present

## 2020-02-06 DIAGNOSIS — M75111 Incomplete rotator cuff tear or rupture of right shoulder, not specified as traumatic: Secondary | ICD-10-CM | POA: Diagnosis not present

## 2020-02-06 DIAGNOSIS — M19011 Primary osteoarthritis, right shoulder: Secondary | ICD-10-CM | POA: Diagnosis not present

## 2020-02-06 MED ORDER — OXYCODONE HCL 5 MG PO TABS: 5.0000 mg | ORAL_TABLET | Freq: Four times a day (QID) | ORAL | 0 refills | Status: DC | PRN

## 2020-02-13 ENCOUNTER — Encounter: Payer: Self-pay | Admitting: Orthopaedic Surgery

## 2020-02-13 ENCOUNTER — Other Ambulatory Visit: Payer: Self-pay

## 2020-02-13 ENCOUNTER — Ambulatory Visit (INDEPENDENT_AMBULATORY_CARE_PROVIDER_SITE_OTHER): Payer: Medicare Other | Admitting: Orthopaedic Surgery

## 2020-02-13 VITALS — Ht 67.0 in | Wt 137.0 lb

## 2020-02-13 DIAGNOSIS — M67814 Other specified disorders of tendon, left shoulder: Secondary | ICD-10-CM | POA: Insufficient documentation

## 2020-02-13 DIAGNOSIS — Z9889 Other specified postprocedural states: Secondary | ICD-10-CM

## 2020-02-13 DIAGNOSIS — M7542 Impingement syndrome of left shoulder: Secondary | ICD-10-CM | POA: Insufficient documentation

## 2020-02-13 NOTE — Progress Notes (Signed)
Office Visit Note   Patient: Julie Mann           Date of Birth: 1945/11/26           MRN: [id number] Visit Date: 02/13/2020              Requested by: Lajean Manes, MD [address] [place],  [place] [address] PCP: Lajean Manes, MD   Assessment & Plan: Visit Diagnoses:  1. Biceps tendonosis of left shoulder   2. Impingement syndrome of left shoulder   3. Post-operative state     Plan:  #1: Dressing was removed and Steri-Strips were removed and replaced. #2: Begin physical therapy for range of motion now  and strengthening in about 4 to 6 weeks.  Follow-Up Instructions: Return for follow up.   Orders:  Orders Placed This Encounter  Procedures  . Ambulatory referral to Physical Therapy   No orders of the defined types were placed in this encounter.     Procedures: No procedures performed   Clinical Data: No additional findings.   Subjective: Chief Complaint  Patient presents with  . Left Shoulder - Follow-up, Routine Post Op    Left shoulder scope DOS 02/06/2020   HPI Patient presents today for a follow up on her left shoulder. She is now one week out from a left shoulder arthroscopy. Her surgery was on 02/06/2020. She did not do well taking the oxycodone, so she has been taking hydrocodone 5/325 from a previous surgery.    Review of Systems   Objective: Vital Signs: Ht 5\' 7"  (1.702 m)   Wt 137 lb (62.1 kg)   LMP 11/14/1992   BMI 21.46 kg/m   Physical Exam  Ortho Exam  Steri-Strips removed and replaced.  No signs of infection.  Can move the arm passively with some pain.  Neurovascular intact distally.  Specialty Comments:  No specialty comments available.  Imaging: No results found.   PMFS History: Current Outpatient Medications  Medication Sig Dispense Refill  . aspirin EC 81 MG tablet Take 1 tablet (81 mg total) by mouth daily. 90 tablet 3  . atorvastatin (LIPITOR) 10 MG tablet Take 10 mg by mouth 2 (two) times a week.  Tuesday and Friday  12  . ibuprofen (ADVIL) 600 MG tablet Take 1 tablet (600 mg total) by mouth every 8 (eight) hours as needed for headache or mild pain. 30 tablet 2  . LORazepam (ATIVAN) 1 MG tablet TAKE 1/2-1 TABLET BY MOUTH NO MORE THAN ONCE DAILY AS NEEDED FOR ANXIETY 30 tablet 0  . metoprolol tartrate (LOPRESSOR) 25 MG tablet Take 25 mg by mouth daily as needed (palpitations).     . Multiple Vitamins-Minerals (MULTIVITAMIN PO) Take 1 tablet by mouth daily.    . Omega-3 Fatty Acids (FISH OIL PO) Take 5 mLs by mouth daily.    Marland Kitchen omeprazole (PRILOSEC) 20 MG capsule Take 20 mg by mouth daily.    Marland Kitchen OVER THE COUNTER MEDICATION Take 5 mLs by mouth daily. 1 teaspoon of vinegar    . OVER THE COUNTER MEDICATION Take 15 mLs by mouth 3 (three) times daily with meals. Liquid Acidophilus    . oxyCODONE (ROXICODONE) 5 MG immediate release tablet Take 1 tablet (5 mg total) by mouth every 6 (six) hours as needed. 30 tablet 0  . Propylene Glycol-Glycerin (SOOTHE) 0.6-0.6 % SOLN Place 1 drop into both eyes daily as needed (pollen allergies).     No current facility-administered medications for this  visit.    Patient Active Problem List   Diagnosis Date Noted  . Biceps tendonosis of left shoulder 02/13/2020  . Impingement syndrome of left shoulder 02/13/2020  . Post-operative state 02/13/2020  . Ankle dislocation, right, initial encounter 03/10/2019  . Trimalleolar fracture of ankle, closed, right, initial encounter   . Chronic left shoulder pain 02/19/2019  . Anxiety 12/17/2016  . 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[place]) 12/17/2016  . Osteopenia 12/17/2016  . Paroxysmal atrial fibrillation ([place]) 12/17/2016  . Pure hypercholesterolemia 12/17/2016  . Rectal cancer ([facility] [facility]) 07/30/2015   Past Medical History:  Diagnosis Date  . Anal cancer ([place]) 2005   SCCa anus-stage II chemo, radiation  . Arthritis    "hands, fingers, back" (02/27/2014)  . Atrial fibrillation ([facility])   . Cat scratch of right hand 02/24/2014   . Dysrhythmia    a fib  . Fall 01/2018  . Fall from horse 01/2018  . Fracture, ankle 02/2019   Right   . High cholesterol   . History of stomach ulcers ~ 1966  . Osteopenia     Family History  Problem Relation Age of Onset  . Breast cancer Maternal Aunt   . Breast cancer Maternal Grandmother   . Heart disease Mother   . Stroke Father   . Rectal cancer Paternal Grandmother   . Breast cancer Sister 61  . Breast cancer Sister 24    Past Surgical History:  Procedure Laterality Date  . ANUS SURGERY  2005   "biopsy"  . BREAST CYST EXCISION  1966  . DILATION AND CURETTAGE OF UTERUS  1980's   S/P miscarriage  . EXTERNAL FIXATION LEG Right 03/10/2019   Procedure: OPEN REDUCTION INTERNAL FIXATION RIGHT ANKLE;  Surgeon: Marchia Bond, MD;  Location: [facility];  Service: Orthopedics;  Laterality: Right;  Marland Kitchen VARICOSE VEIN SURGERY Left    left leg   Social History   Occupational History  . Not on file  Tobacco Use  . Smoking status: Former Smoker    Packs/day: 1.00    Years: 30.00    Pack years: 30.00    Types: Cigarettes  . Smokeless tobacco: Never Used  Substance and Sexual Activity  . Alcohol use: No  . Drug use: No  . Sexual activity: Yes    Partners: Male    Birth control/protection: Post-menopausal

## 2020-02-19 ENCOUNTER — Telehealth: Payer: Self-pay | Admitting: Orthopaedic Surgery

## 2020-02-19 ENCOUNTER — Other Ambulatory Visit: Payer: Self-pay | Admitting: Orthopaedic Surgery

## 2020-02-19 ENCOUNTER — Encounter: Payer: Self-pay | Admitting: Physical Therapy

## 2020-02-19 ENCOUNTER — Ambulatory Visit: Payer: Medicare Other | Admitting: Rehabilitative and Restorative Service Providers"

## 2020-02-19 ENCOUNTER — Other Ambulatory Visit: Payer: Self-pay

## 2020-02-19 DIAGNOSIS — M25612 Stiffness of left shoulder, not elsewhere classified: Secondary | ICD-10-CM

## 2020-02-19 DIAGNOSIS — M6281 Muscle weakness (generalized): Secondary | ICD-10-CM

## 2020-02-19 DIAGNOSIS — G8929 Other chronic pain: Secondary | ICD-10-CM

## 2020-02-19 DIAGNOSIS — M25512 Pain in left shoulder: Secondary | ICD-10-CM

## 2020-02-19 MED ORDER — HYDROCODONE-ACETAMINOPHEN 5-325 MG PO TABS: 1.0000 | ORAL_TABLET | Freq: Four times a day (QID) | ORAL | 0 refills | Status: DC | PRN

## 2020-02-19 NOTE — Telephone Encounter (Signed)
Left shoulder scope 02/06/2020

## 2020-02-19 NOTE — Therapy (Signed)
[facility] [place], [place], [address] Phone: [phone]   Fax:  [phone]  Physical Therapy Evaluation  Patient Details  Name: Julie Mann MRN: [id number] Date of Birth: 01-04-1946 Referring Provider (PT): Dr. Durward Fortes   Encounter Date: 02/19/2020  PT End of Session - 02/19/20 1502    Visit Number  1    Number of Visits  16    Date for PT Re-Evaluation  04/01/20    Authorization Type  AARP    Authorization Time Period  02/19/2020 - 04/29/2020    PT Start Time  1400    PT Stop Time  1445    PT Time Calculation (min)  45 min    Activity Tolerance  Patient limited by pain    Behavior During Therapy  [facility] for tasks assessed/performed       Past Medical History:  Diagnosis Date  . 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 Arthritis    "hands, fingers, back" (02/27/2014)  . Atrial fibrillation ([place])   . Cat scratch of right hand 02/24/2014  . Dysrhythmia    a fib  . Fall 01/2018  . Fall from horse 01/2018  . Fracture, ankle 02/2019   Right   . High cholesterol   . History of stomach ulcers ~ 1966  . Osteopenia     Past Surgical History:  Procedure Laterality Date  . ANUS SURGERY  2005   "biopsy"  . BREAST CYST EXCISION  1966  . DILATION AND CURETTAGE OF UTERUS  1980's   S/P miscarriage  . EXTERNAL FIXATION LEG Right 03/10/2019   Procedure: OPEN REDUCTION INTERNAL FIXATION RIGHT ANKLE;  Surgeon: Marchia Bond, MD;  Location: [place];  Service: Orthopedics;  Laterality: Right;  Marland Kitchen VARICOSE VEIN SURGERY Left    left leg    There were no vitals filed for this visit.   Subjective Assessment - 02/19/20 1400    Subjective  Pt. comes to clinic s/p Lt shoulder scope, SAD, DCR, and open biceps tenodesis on 02/06/2020.  Pt. stated having pain after surgery.  Pt. stated wearing sling part time, mainly in community.  Pt. stated sleeping without it at night.  Pt. stated sleeping propped in bed.    Limitations  Lifting;House hold activities    Currently in Pain?  Yes    Pain Score  3    at worst 6/10   Pain Location  Shoulder    Pain Orientation  Left    Pain Descriptors / Indicators  Aching;Throbbing    Pain Type  Surgical pain    Pain Onset  1 to 4 weeks ago    Pain Frequency  Constant    Aggravating Factors   active movements, currently limited in activity c Lt UE    Pain Relieving Factors  pain medicine         [facility] PT Assessment - 02/19/20 0001      Assessment   Medical Diagnosis  Lt shoulder arthroscopy, SAD, DCR, open biceps tenodesis    Referring Provider (PT)  Dr. Durward Fortes    Onset Date/Surgical Date  02/06/20    Hand Dominance  Right    Prior Therapy  yes      Precautions   Precautions  Shoulder    Type of Shoulder Precautions  ROM, no strengthening for 4-6 weeks post op    Shoulder Interventions  Shoulder sling/immobilizer   in public primarily     Restrictions  Weight Bearing Restrictions  No      Balance Screen   Has the patient fallen in the past 6 months  No    Has the patient had a decrease in activity level because of a fear of falling?   Yes   2/2 condition   Is the patient reluctant to leave their home because of a fear of falling?   No      Home Film/video editor residence      Prior Function   Level of Independence  Independent    Vocation  Other (comment)   [place]  yard work, self care, household activity      Cognition   Overall Cognitive Status  Within Functional Limits for tasks assessed      Observation/Other Assessments   Observations  Patient specific functional scale:  violin (0/10), reaching overhead 0/10      Posture/Postural Control   Posture Comments  Lt shoulder positioned in protraction, anterior tilt compared to Rt.       ROM / Strength   AROM / PROM / Strength  AROM;PROM;Strength      AROM   Overall AROM Comments  Measured in supine    AROM Assessment Site  Shoulder     Right/Left Shoulder  Left;Right    Left Shoulder Flexion  60 Degrees   concordant pain   Left Shoulder ABduction  80 Degrees    Left Shoulder Internal Rotation  40 Degrees   in 45 deg abd in supine   Left Shoulder External Rotation  25 Degrees   in 45 deg abd supine     PROM   Overall PROM Comments  Measured in supine    PROM Assessment Site  Shoulder    Right/Left Shoulder  Left;Right    Left Shoulder Flexion  90 Degrees   concordant pain   Left Shoulder ABduction  90 Degrees    Left Shoulder Internal Rotation  45 Degrees   concordant pain   Left Shoulder External Rotation  25 Degrees   concordant pain     Strength   Overall Strength Comments  MMT not tested on Lt shoulder due to surgical protocol    Strength Assessment Site  Shoulder;Elbow    Right/Left Shoulder  Left;Right    Right Shoulder Flexion  5/5    Right Shoulder ABduction  5/5    Right Shoulder Internal Rotation  5/5    Right Shoulder External Rotation  5/5    Right/Left Elbow  Left;Right    Right Elbow Flexion  5/5    Right Elbow Extension  5/5    Left Elbow Extension  4/5      Palpation   Palpation comment  Tenderness to touch anterior jt line, distal AC joint Lt shoulder      Special Tests   Other special tests  Rt HBB T10, Rt HBH T4                Objective measurements completed on examination: See above findings.      [place] Adult PT Treatment/Exercise - 02/19/20 0001      Exercises   Exercises  Shoulder      Shoulder Exercises: Supine   Other Supine Exercises  PROM flexion to 90 deg x 10      Shoulder Exercises: Seated   Other Seated Exercises  seated table slide flexion, scaption 3 x 10 each    Other Seated Exercises  seated scapular  retraction 3 x 10      Manual Therapy   Manual Therapy  Joint mobilization    Joint Mobilization  g2-g3 in mid range, prom             PT Education - 02/19/20 1408    Education Details  HEP, POC    Person(s) Educated  Patient    Methods   Explanation;Demonstration;Verbal cues;Handout    Comprehension  Returned demonstration;Verbalized understanding          PT Long Term Goals - 02/19/20 1459      PT LONG TERM GOAL #1   Title  Patient will demonstrate/report pain at worst less than or equal to 2/10 to facilitate minimal limitation in daily activity secondary to pain symptoms.    Time  8    Period  Weeks    Status  New    Target Date  04/15/20      PT LONG TERM GOAL #2   Title  Patient will demonstrate independent use of home exercise program to facilitate ability to maintain/progress functional gains from skilled physical therapy services.    Time  8    Period  Weeks    Status  New    Target Date  04/15/20      PT LONG TERM GOAL #3   Title  Patient will demonstrate Lt [place] joint mobility WFL to facilitate usual self care, dressing, reaching overhead at PLOF s limitation due to symptoms.    Time  8    Period  Weeks    Status  New    Target Date  04/15/20      PT LONG TERM GOAL #4   Title  Pt. will demonstrate Lt UE MMT equal to Rt to facilitate usual lifting, carrying, recreational activity (violin) at PLOF.    Time  8    Period  Weeks    Status  New    Target Date  04/15/20      PT LONG TERM GOAL #5   Title  Pt. will demonstrate/report ability to play instrument at PLOF s limitation.    Time  8    Period  Weeks    Status  New    Target Date  04/15/20             Plan - 02/19/20 1449    Clinical Impression Statement  Patient is a 74 y.o. female who comes to clinic with complaints of Lt shoulder pain s/p shoulder surgery with mobility, strength and movement coordination deficits that impair her ability to perform usual daily and recreational functional activities without increase difficulty/symptoms at this time.  Patient to benefit from skilled PT services to address impairments and limitations to improve to previous level of function without restriction secondary to condition.     Examination-Activity Limitations  Bathing;Carry;Lift;Reach Overhead    Armed forces operational officer Activity;Interpersonal Relationship;Yard Work;Meal Prep    Stability/Clinical Decision Making  Stable/Uncomplicated    Clinical Decision Making  Low    Rehab Potential  Good    PT Frequency  2x / week    PT Duration  8 weeks    PT Treatment/Interventions  ADLs/Self Care Home Management;Electrical Stimulation;Ultrasound;Traction;Moist Heat;Iontophoresis 4mg /ml Dexamethasone;Gait training;Stair training;Functional mobility training;Therapeutic activities;Therapeutic exercise;Balance training;Neuromuscular re-education;Manual techniques;Patient/family education;Passive range of motion;Dry needling;Joint Manipulations;Spinal Manipulations;Vasopneumatic Device;Taping    PT Next Visit Plan  Manual for pain/mobility, progress PROM, AAROM activity.    PT Home Exercise Plan  [id number]    Consulted and Agree with Plan  of Care  Patient       Patient will benefit from skilled therapeutic intervention in order to improve the following deficits and impairments:  Decreased coordination, Decreased range of motion, Impaired UE functional use, Decreased activity tolerance, Pain, Hypomobility, Decreased mobility, Decreased strength, Postural dysfunction  Visit Diagnosis: Chronic left shoulder pain - Plan: PT plan of care cert/re-cert  Stiffness of left shoulder, not elsewhere classified - Plan: PT plan of care cert/re-cert  Muscle weakness (generalized) - Plan: PT plan of care cert/re-cert     Problem List Patient Active Problem List   Diagnosis Date Noted  . Biceps tendonosis of left shoulder 02/13/2020  . Impingement syndrome of left shoulder 02/13/2020  . Post-operative state 02/13/2020  . Ankle dislocation, right, initial encounter 03/10/2019  . Trimalleolar fracture of ankle, closed, right, initial encounter   . Chronic left shoulder pain 02/19/2019  . Anxiety 12/17/2016  .  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 Osteopenia 12/17/2016  . Paroxysmal atrial fibrillation ([place]) 12/17/2016  . Pure hypercholesterolemia 12/17/2016  . Rectal cancer ([place]) 07/30/2015    Scot Jun, PT, DPT, OCS, ATC 02/19/20  3:11 PM    [facility] [address] [place], [place], [address] Phone: [phone]   Fax:  [phone]  Name: Julie Mann MRN: [id number] Date of Birth: January 04, 1946

## 2020-02-19 NOTE — Telephone Encounter (Signed)
prescribed

## 2020-02-19 NOTE — Telephone Encounter (Signed)
Patient asked for refill of hydrocodone. Please send to [facility] at [facility]. Patient also wanted to know if it is ok to use CBD oil. Patient request call when medication is sent to pharmacy. Patient phone number is [phone].

## 2020-02-19 NOTE — Patient Instructions (Signed)
Access Code: [email or web address] URL: [email or web address] Date: 02/19/2020 Prepared by: Scot Jun  Exercises Seated Shoulder Flexion Towel Slide at Table Top - 2 x daily - 7 x weekly - 2-3 sets - 10 reps - 5 hold Seated Shoulder Scaption Slide at Table Top with Forearm in Neutral - 2 x daily - 7 x weekly - 2 sets - 10 reps - 5 hold Seated Scapular Retraction - 2 x daily - 7 x weekly - 10 reps - 2 sets - 5 hold

## 2020-02-19 NOTE — Telephone Encounter (Signed)
Spoke with patient and notified her that hydrocodone has been sent to her pharmacy as requested.

## 2020-02-24 ENCOUNTER — Ambulatory Visit: Payer: Medicare Other | Admitting: Physical Therapy

## 2020-02-24 ENCOUNTER — Other Ambulatory Visit: Payer: Self-pay

## 2020-02-24 DIAGNOSIS — M25612 Stiffness of left shoulder, not elsewhere classified: Secondary | ICD-10-CM | POA: Diagnosis not present

## 2020-02-24 DIAGNOSIS — G8929 Other chronic pain: Secondary | ICD-10-CM

## 2020-02-24 DIAGNOSIS — M6281 Muscle weakness (generalized): Secondary | ICD-10-CM | POA: Diagnosis not present

## 2020-02-24 DIAGNOSIS — M25512 Pain in left shoulder: Secondary | ICD-10-CM

## 2020-02-25 NOTE — Therapy (Signed)
[place], [place], [address] Phone: [phone]   Fax:  [phone]  Physical Therapy Treatment  Patient Details  Name: Julie Mann MRN: [id number] Date of Birth: Feb 24, 1946 Referring Provider (PT): Dr. Durward Fortes   Encounter Date: 02/24/2020  PT End of Session - 02/25/20 0800    Visit Number  2    Number of Visits  16    Date for PT Re-Evaluation  04/01/20    Authorization Type  AARP    Authorization Time Period  02/19/2020 - 04/29/2020    PT Start Time  1445    PT Stop Time  1530    PT Time Calculation (min)  45 min    Activity Tolerance  Patient limited by pain    Behavior During Therapy  [facility] for tasks assessed/performed       Past Medical History:  Diagnosis Date  . 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 Arthritis    "hands, fingers, back" (02/27/2014)  . Atrial fibrillation ([place])   . Cat scratch of right hand 02/24/2014  . Dysrhythmia    a fib  . Fall 01/2018  . Fall from horse 01/2018  . Fracture, ankle 02/2019   Right   . High cholesterol   . History of stomach ulcers ~ 1966  . Osteopenia     Past Surgical History:  Procedure Laterality Date  . ANUS SURGERY  2005   "biopsy"  . BREAST CYST EXCISION  1966  . DILATION AND CURETTAGE OF UTERUS  1980's   S/P miscarriage  . EXTERNAL FIXATION LEG Right 03/10/2019   Procedure: OPEN REDUCTION INTERNAL FIXATION RIGHT ANKLE;  Surgeon: Marchia Bond, MD;  Location: [place];  Service: Orthopedics;  Laterality: Right;  Marland Kitchen VARICOSE VEIN SURGERY Left    left leg    There were no vitals filed for this visit.  Subjective Assessment - 02/25/20 0759    Subjective  relays 4/10 overall pain in her Lt shoulder, feels it is slowly getting better    Limitations  Lifting;House hold activities    Pain Onset  1 to 4 weeks ago         [facility] Adult PT Treatment/Exercise - 02/25/20 0001      Shoulder Exercises: Seated   Other Seated Exercises   seated table slide flexion, scaption, ER 5 sec  X10 ea      Shoulder Exercises: Standing   Other Standing Exercises  pendulums X 10 each plane and circles for Lt shoulder X 10 ea, standing scapular retraction and bilat ER2X10 ea. P ball roll outs on high mat table for flexion and scaption X 10 ea      Shoulder Exercises: Pulleys   Flexion  2 minutes    Scaption  2 minutes      Manual Therapy   Joint Mobilization  g2-g3 inferior/distraction mobs in mid range, prom Lt shoulder all planes to tolerance                  PT Long Term Goals - 02/19/20 1459      PT LONG TERM GOAL #1   Title  Patient will demonstrate/report pain at worst less than or equal to 2/10 to facilitate minimal limitation in daily activity secondary to pain symptoms.    Time  8    Period  Weeks    Status  New    Target Date  04/15/20      PT LONG  TERM GOAL #2   Title  Patient will demonstrate independent use of home exercise program to facilitate ability to maintain/progress functional gains from skilled physical therapy services.    Time  8    Period  Weeks    Status  New    Target Date  04/15/20      PT LONG TERM GOAL #3   Title  Patient will demonstrate Lt [place] joint mobility WFL to facilitate usual self care, dressing, reaching overhead at PLOF s limitation due to symptoms.    Time  8    Period  Weeks    Status  New    Target Date  04/15/20      PT LONG TERM GOAL #4   Title  Pt. will demonstrate Lt UE MMT equal to Rt to facilitate usual lifting, carrying, recreational activity (violin) at PLOF.    Time  8    Period  Weeks    Status  New    Target Date  04/15/20      PT LONG TERM GOAL #5   Title  Pt. will demonstrate/report ability to play instrument at PLOF s limitation.    Time  8    Period  Weeks    Status  New    Target Date  04/15/20            Plan - 02/25/20 0801    Clinical Impression Statement  Session focused on Lt shoulder PROM and AAROM as tolerated. She is improving  in this area since evaluation but still with deficits. Continue POC    Examination-Activity Limitations  Bathing;Carry;Lift;Reach Overhead    Examination-Participation Restrictions  Community Activity;Interpersonal Relationship;Yard Work;Meal Prep    Stability/Clinical Decision Making  Stable/Uncomplicated    Rehab Potential  Good    PT Frequency  2x / week    PT Duration  8 weeks    PT Treatment/Interventions  ADLs/Self Care Home Management;Electrical Stimulation;Ultrasound;Traction;Moist Heat;Iontophoresis 4mg /ml Dexamethasone;Gait training;Stair training;Functional mobility training;Therapeutic activities;Therapeutic exercise;Balance training;Neuromuscular re-education;Manual techniques;Patient/family education;Passive range of motion;Dry needling;Joint Manipulations;Spinal Manipulations;Vasopneumatic Device;Taping    PT Next Visit Plan  Manual for pain/mobility, progress PROM, AAROM activity.    PT Home Exercise Plan  [id number]    Consulted and Agree with Plan of Care  Patient       Patient will benefit from skilled therapeutic intervention in order to improve the following deficits and impairments:  Decreased coordination, Decreased range of motion, Impaired UE functional use, Decreased activity tolerance, Pain, Hypomobility, Decreased mobility, Decreased strength, Postural dysfunction  Visit Diagnosis: Chronic left shoulder pain  Stiffness of left shoulder, not elsewhere classified  Muscle weakness (generalized)     Problem List Patient Active Problem List   Diagnosis Date Noted  . Biceps tendonosis of left shoulder 02/13/2020  . Impingement syndrome of left shoulder 02/13/2020  . Post-operative state 02/13/2020  . Ankle dislocation, right, initial encounter 03/10/2019  . Trimalleolar fracture of ankle, closed, right, initial encounter   . Chronic left shoulder pain 02/19/2019  . Anxiety 12/17/2016  . 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[place]) 12/17/2016  . Osteopenia 12/17/2016   . Paroxysmal atrial fibrillation ([place]) 12/17/2016  . Pure hypercholesterolemia 12/17/2016  . Rectal cancer ([place]) 07/30/2015    Debbe Odea, PT,DPT 02/25/2020, 8:02 AM  [facility] [address] [place], [place], [address] Phone: [phone]   Fax:  [phone]  Name: Julie Mann MRN: [id number] Date of Birth: [birth year]

## 2020-02-26 ENCOUNTER — Ambulatory Visit: Payer: Medicare Other | Admitting: Physical Therapy

## 2020-02-26 ENCOUNTER — Encounter: Payer: Self-pay | Admitting: Physical Therapy

## 2020-02-26 ENCOUNTER — Encounter: Payer: Medicare Other | Admitting: Rehabilitative and Restorative Service Providers"

## 2020-02-26 ENCOUNTER — Other Ambulatory Visit: Payer: Self-pay

## 2020-02-26 DIAGNOSIS — G8929 Other chronic pain: Secondary | ICD-10-CM

## 2020-02-26 DIAGNOSIS — M25512 Pain in left shoulder: Secondary | ICD-10-CM | POA: Diagnosis not present

## 2020-02-26 DIAGNOSIS — M6281 Muscle weakness (generalized): Secondary | ICD-10-CM | POA: Diagnosis not present

## 2020-02-26 DIAGNOSIS — M25612 Stiffness of left shoulder, not elsewhere classified: Secondary | ICD-10-CM

## 2020-02-26 NOTE — Patient Instructions (Signed)
Access Code: [email or web address] URL: [email or web address] Date: 02/26/2020 Prepared by: Faustino Congress  Exercises Seated Shoulder Flexion Towel Slide at Table Top - 2 x daily - 7 x weekly - 2-3 sets - 10 reps - 5 hold Seated Shoulder Scaption Slide at Table Top with Forearm in Neutral - 2 x daily - 7 x weekly - 2 sets - 10 reps - 5 hold Seated Scapular Retraction - 2 x daily - 7 x weekly - 10 reps - 2 sets - 5 hold Seated Shoulder External Rotation PROM on Table - 2 x daily - 7 x weekly - 2 sets - 10 reps - 5 sec hold Circular Shoulder Pendulum with Table Support - 2 x daily - 7 x weekly - 2 sets - 10 reps Horizontal Shoulder Pendulum with Table Support - 2 x daily - 7 x weekly - 2 sets - 10 reps Supine Shoulder Flexion with Dowel - 2 x daily - 7 x weekly - 1 sets - 5-10 reps Supine Shoulder External Rotation AAROM with Dowel - 2 x daily - 7 x weekly - 1 sets - 5-10 reps

## 2020-02-26 NOTE — Therapy (Signed)
[facility] [place], [place], [address] Phone: [phone]   Fax:  [phone]  Physical Therapy Treatment  Patient Details  Name: Julie Mann MRN: [id number] Date of Birth: May 30, 1946 Referring Provider (PT): Dr. Durward Fortes   Encounter Date: 02/26/2020  PT End of Session - 02/26/20 1233    Visit Number  3    Number of Visits  16    Date for PT Re-Evaluation  04/01/20    Authorization Type  AARP    Authorization Time Period  02/19/2020 - 04/29/2020    PT Start Time  1146    PT Stop Time  1227    PT Time Calculation (min)  41 min    Activity Tolerance  Patient limited by pain    Behavior During Therapy  [facility] for tasks assessed/performed       Past Medical History:  Diagnosis Date  . 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 Arthritis    "hands, fingers, back" (02/27/2014)  . Atrial fibrillation ([place])   . Cat scratch of right hand 02/24/2014  . Dysrhythmia    a fib  . Fall 01/2018  . Fall from horse 01/2018  . Fracture, ankle 02/2019   Right   . High cholesterol   . History of stomach ulcers ~ 1966  . Osteopenia     Past Surgical History:  Procedure Laterality Date  . ANUS SURGERY  2005   "biopsy"  . BREAST CYST EXCISION  1966  . DILATION AND CURETTAGE OF UTERUS  1980's   S/P miscarriage  . EXTERNAL FIXATION LEG Right 03/10/2019   Procedure: OPEN REDUCTION INTERNAL FIXATION RIGHT ANKLE;  Surgeon: Marchia Bond, MD;  Location: [place];  Service: Orthopedics;  Laterality: Right;  Marland Kitchen VARICOSE VEIN SURGERY Left    left leg    There were no vitals filed for this visit.  Subjective Assessment - 02/26/20 1149    Subjective  shoulder is doing well - just sore    Limitations  Lifting;House hold activities    Patient Stated Goals  regain function of shoulder    Currently in Pain?  --   just soreness   Pain Onset  1 to 4 weeks ago                       [facility] Adult PT  Treatment/Exercise - 02/26/20 1150      Shoulder Exercises: Supine   External Rotation  Left;10 reps;AAROM   1# bar   Flexion  AAROM;5 reps   1# bar     Shoulder Exercises: Seated   Other Seated Exercises  seated table slide flexion, scaption, ER 5 sec  X10 ea      Shoulder Exercises: Pulleys   Flexion  2 minutes    Scaption  2 minutes      Shoulder Exercises: ROM/Strengthening   Pendulum  circles and lateral x15-20 each way      Manual Therapy   Joint Mobilization  g2-g3 inferior/distraction mobs in mid range, prom Lt shoulder all planes to tolerance             PT Education - 02/26/20 1232    Education Details  HEP    Person(s) Educated  Patient    Methods  Explanation;Demonstration;Handout    Comprehension  Verbalized understanding;Returned demonstration;Need further instruction          PT Long Term Goals - 02/19/20 1459  PT LONG TERM GOAL #1   Title  Patient will demonstrate/report pain at worst less than or equal to 2/10 to facilitate minimal limitation in daily activity secondary to pain symptoms.    Time  8    Period  Weeks    Status  New    Target Date  04/15/20      PT LONG TERM GOAL #2   Title  Patient will demonstrate independent use of home exercise program to facilitate ability to maintain/progress functional gains from skilled physical therapy services.    Time  8    Period  Weeks    Status  New    Target Date  04/15/20      PT LONG TERM GOAL #3   Title  Patient will demonstrate Lt [place] joint mobility WFL to facilitate usual self care, dressing, reaching overhead at PLOF s limitation due to symptoms.    Time  8    Period  Weeks    Status  New    Target Date  04/15/20      PT LONG TERM GOAL #4   Title  Pt. will demonstrate Lt UE MMT equal to Rt to facilitate usual lifting, carrying, recreational activity (violin) at PLOF.    Time  8    Period  Weeks    Status  New    Target Date  04/15/20      PT LONG TERM GOAL #5   Title  Pt.  will demonstrate/report ability to play instrument at PLOF s limitation.    Time  8    Period  Weeks    Status  New    Target Date  04/15/20            Plan - 02/26/20 1233    Clinical Impression Statement  Pt tolerated session well today and able to progress HEP to include some additional PROM/AAROM exercises.  Will continue to benefit from PT to maximize function.    Examination-Activity Limitations  Bathing;Carry;Lift;Reach Overhead    Armed forces operational officer Activity;Interpersonal Relationship;Yard Work;Meal Prep    Stability/Clinical Decision Making  Stable/Uncomplicated    Rehab Potential  Good    PT Frequency  2x / week    PT Duration  8 weeks    PT Treatment/Interventions  ADLs/Self Care Home Management;Electrical Stimulation;Ultrasound;Traction;Moist Heat;Iontophoresis 4mg /ml Dexamethasone;Gait training;Stair training;Functional mobility training;Therapeutic activities;Therapeutic exercise;Balance training;Neuromuscular re-education;Manual techniques;Patient/family education;Passive range of motion;Dry needling;Joint Manipulations;Spinal Manipulations;Vasopneumatic Device;Taping    PT Next Visit Plan  Manual for pain/mobility, progress PROM, AAROM activity. see how cane exercises are going    PT Home Exercise Plan  [email or web address]    Consulted and Agree with Plan of Care  Patient       Patient will benefit from skilled therapeutic intervention in order to improve the following deficits and impairments:  Decreased coordination, Decreased range of motion, Impaired UE functional use, Decreased activity tolerance, Pain, Hypomobility, Decreased mobility, Decreased strength, Postural dysfunction  Visit Diagnosis: Chronic left shoulder pain  Stiffness of left shoulder, not elsewhere classified  Muscle weakness (generalized)     Problem List Patient Active Problem List   Diagnosis Date Noted  . Biceps tendonosis of left shoulder 02/13/2020  .  Impingement syndrome of left shoulder 02/13/2020  . Post-operative state 02/13/2020  . Ankle dislocation, right, initial encounter 03/10/2019  . Trimalleolar fracture of ankle, closed, right, initial encounter   . Chronic left shoulder pain 02/19/2019  . Anxiety 12/17/2016  . 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  Osteopenia 12/17/2016  . Paroxysmal atrial fibrillation ([place]) 12/17/2016  . Pure hypercholesterolemia 12/17/2016  . Rectal cancer ([place]) 07/30/2015      Laureen Abrahams, PT, DPT 02/26/20 12:35 PM    [facility] Physical Therapy [address] [place], [place], [address] Phone: [phone]   Fax:  [phone]  Name: Julie Mann MRN: [id number] Date of Birth: November 08, 1946

## 2020-02-27 ENCOUNTER — Encounter: Payer: Self-pay | Admitting: Orthopedic Surgery

## 2020-02-27 ENCOUNTER — Ambulatory Visit: Payer: Self-pay

## 2020-02-27 ENCOUNTER — Ambulatory Visit: Payer: Medicare Other | Admitting: Orthopedic Surgery

## 2020-02-27 DIAGNOSIS — M25571 Pain in right ankle and joints of right foot: Secondary | ICD-10-CM | POA: Diagnosis not present

## 2020-02-27 DIAGNOSIS — M25871 Other specified joint disorders, right ankle and foot: Secondary | ICD-10-CM | POA: Diagnosis not present

## 2020-02-27 MED ORDER — METHYLPREDNISOLONE ACETATE 40 MG/ML IJ SUSP
40.0000 mg | INTRAMUSCULAR | Status: AC | PRN
  Administered 2020-02-27: 40 mg via INTRA_ARTICULAR

## 2020-02-27 MED ORDER — LIDOCAINE HCL 1 % IJ SOLN
2.0000 mL | INTRAMUSCULAR | Status: AC | PRN
  Administered 2020-02-27: 2 mL

## 2020-02-27 NOTE — Progress Notes (Signed)
Office Visit Note   Patient: Julie Mann           Date of Birth: 1946-01-26           MRN: [id number] Visit Date: 02/27/2020              Requested by: Lajean Manes, MD [address] [place],  [place] [address] PCP: Lajean Manes, MD  Chief Complaint  Patient presents with  . Right Ankle - Pain      HPI: Patient is a 74 year old woman who is 1 year status post open reduction internal fixation trimalleolar right ankle fracture.  Patient states she has been having about 3 weeks history of pain and swelling anteriorly over the right ankle.  Patient states she is recently status post intervention for her left shoulder.  Assessment & Plan: Visit Diagnoses:  1. Pain in right ankle and joints of right foot   2. Impingement syndrome of right ankle     Plan: The right ankle was injected through the anterior medial portal she tolerated this well recommended continue with her compression stocking.  Discussed that if she has recurrent pain we could proceed with an additional injection discussed that if the injections do not help arthroscopic debridement would be an option.  Follow-Up Instructions: Return if symptoms worsen or fail to improve.   Ortho Exam  Patient is alert, oriented, no adenopathy, well-dressed, normal affect, normal respiratory effort. Examination patient does have some mild swelling of the right ankle she has good ankle and subtalar motion.  She is point tender to palpation anteriorly over the joint line.  Subtalar joint is not tender to palpation.  Imaging: XR Ankle Complete Right  Result Date: 02/27/2020 Three-view radiographs of the right ankle shows a congruent mortise no bony spurs the joint space is narrowed.  No complicating features with the hardware.  No images are attached to the encounter.  Labs: No results found for: HGBA1C, ESRSEDRATE, CRP, LABURIC, REPTSTATUS, GRAMSTAIN, CULT, LABORGA   Lab Results  Component Value Date   ALBUMIN 4.1 03/24/2009   ALBUMIN 3.9 09/24/2008   ALBUMIN 4.2 03/24/2008    No results found for: MG No results found for: VD25OH  No results found for: PREALBUMIN CBC EXTENDED Latest Ref Rng & Units 03/10/2019 02/05/2018 10/25/2016  WBC 4.0 - 10.5 K/uL 13.1(H) 15.7(H) 6.4  RBC 3.87 - 5.11 MIL/uL 4.70 5.17(H) 5.20(H)  HGB 12.0 - 15.0 g/dL 12.8 14.2 14.0  HCT 36.0 - 46.0 % 40.5 43.5 42.7  PLT 150 - 400 K/uL 287 372 311  NEUTROABS 1.7 - 7.7 K/uL - 13.9(H) -  LYMPHSABS 0.7 - 4.0 K/uL - 1.0 -     There is no height or weight on file to calculate BMI.  Orders:  Orders Placed This Encounter  Procedures  . XR Ankle Complete Right   No orders of the defined types were placed in this encounter.    Procedures: Medium Joint Inj: R ankle on 02/27/2020 12:15 PM Indications: pain and diagnostic evaluation Details: 22 G 1.5 in needle, anteromedial approach Medications: 2 mL lidocaine 1 %; 40 mg methylPREDNISolone acetate 40 MG/ML Outcome: tolerated well, no immediate complications Procedure, treatment alternatives, risks and benefits explained, specific risks discussed. Consent was given by the patient. Immediately prior to procedure a time out was called to verify the correct patient, procedure, equipment, support staff and site/side marked as required. Patient was prepped and draped in the usual sterile fashion.  Clinical Data: No additional findings.  ROS:  All other systems negative, except as noted in the HPI. Review of Systems  Objective: Vital Signs: LMP 11/14/1992   Specialty Comments:  No specialty comments available.  PMFS History: Patient Active Problem List   Diagnosis Date Noted  . Biceps tendonosis of left shoulder 02/13/2020  . Impingement syndrome of left shoulder 02/13/2020  . Post-operative state 02/13/2020  . Ankle dislocation, right, initial encounter 03/10/2019  . Trimalleolar fracture of ankle, closed, right, initial encounter   . Chronic left  shoulder pain 02/19/2019  . Anxiety 12/17/2016  . 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[place]) 12/17/2016  . Osteopenia 12/17/2016  . Paroxysmal atrial fibrillation ([place]) 12/17/2016  . Pure hypercholesterolemia 12/17/2016  . Rectal cancer ([place]) 07/30/2015   Past Medical History:  Diagnosis Date  . 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 Arthritis    "hands, fingers, back" (02/27/2014)  . Atrial fibrillation ([place])   . Cat scratch of right hand 02/24/2014  . Dysrhythmia    a fib  . Fall 01/2018  . Fall from horse 01/2018  . Fracture, ankle 02/2019   Right   . High cholesterol   . History of stomach ulcers ~ 1966  . Osteopenia     Family History  Problem Relation Age of Onset  . Breast cancer Maternal Aunt   . Breast cancer Maternal Grandmother   . Heart disease Mother   . Stroke Father   . Rectal cancer Paternal Grandmother   . Breast cancer Sister 48  . Breast cancer Sister 34    Past Surgical History:  Procedure Laterality Date  . ANUS SURGERY  2005   "biopsy"  . BREAST CYST EXCISION  1966  . DILATION AND CURETTAGE OF UTERUS  1980's   S/P miscarriage  . EXTERNAL FIXATION LEG Right 03/10/2019   Procedure: OPEN REDUCTION INTERNAL FIXATION RIGHT ANKLE;  Surgeon: Marchia Bond, MD;  Location: [facility];  Service: Orthopedics;  Laterality: Right;  Marland Kitchen VARICOSE VEIN SURGERY Left    left leg   Social History   Occupational History  . Not on file  Tobacco Use  . Smoking status: Former Smoker    Packs/day: 1.00    Years: 30.00    Pack years: 30.00    Types: Cigarettes  . Smokeless tobacco: Never Used  Substance and Sexual Activity  . Alcohol use: No  . Drug use: No  . Sexual activity: Yes    Partners: Male    Birth control/protection: Post-menopausal

## 2020-02-28 ENCOUNTER — Encounter: Payer: Medicare Other | Admitting: Rehabilitative and Restorative Service Providers"

## 2020-03-03 ENCOUNTER — Encounter: Payer: Medicare Other | Admitting: Physical Therapy

## 2020-03-04 ENCOUNTER — Other Ambulatory Visit: Payer: Self-pay

## 2020-03-04 ENCOUNTER — Ambulatory Visit: Payer: Medicare Other | Admitting: Physical Therapy

## 2020-03-04 ENCOUNTER — Encounter: Payer: Self-pay | Admitting: Physical Therapy

## 2020-03-04 DIAGNOSIS — M6281 Muscle weakness (generalized): Secondary | ICD-10-CM

## 2020-03-04 DIAGNOSIS — M25512 Pain in left shoulder: Secondary | ICD-10-CM

## 2020-03-04 DIAGNOSIS — M25612 Stiffness of left shoulder, not elsewhere classified: Secondary | ICD-10-CM | POA: Diagnosis not present

## 2020-03-04 DIAGNOSIS — G8929 Other chronic pain: Secondary | ICD-10-CM | POA: Diagnosis not present

## 2020-03-04 NOTE — Patient Instructions (Signed)
Access Code: [email or web address] URL: [email or web address] Date: 03/04/2020 Prepared by: Faustino Congress  Exercises Seated Shoulder Flexion Towel Slide at Table Top - 2 x daily - 7 x weekly - 2-3 sets - 10 reps - 5 hold Seated Shoulder Scaption Slide at Table Top with Forearm in Neutral - 2 x daily - 7 x weekly - 2 sets - 10 reps - 5 hold Seated Scapular Retraction - 2 x daily - 7 x weekly - 10 reps - 2 sets - 5 hold Seated Shoulder External Rotation PROM on Table - 2 x daily - 7 x weekly - 2 sets - 10 reps - 5 sec hold Circular Shoulder Pendulum with Table Support - 2 x daily - 7 x weekly - 2 sets - 10 reps Horizontal Shoulder Pendulum with Table Support - 2 x daily - 7 x weekly - 2 sets - 10 reps Supine Shoulder Flexion with Dowel - 2 x daily - 7 x weekly - 1 sets - 5-10 reps Supine Shoulder External Rotation AAROM with Dowel - 2 x daily - 7 x weekly - 1 sets - 5-10 reps Isometric Shoulder Flexion at Wall - 1 x daily - 7 x weekly - 1-2 sets - 10 reps - 5 sec hold Standing Isometric Shoulder Internal Rotation at Doorway - 1 x daily - 7 x weekly - 1-2 sets - 10 reps - 5 sec hold Standing Isometric Shoulder External Rotation with Doorway - 1 x daily - 7 x weekly - 1-2 sets - 10 reps - 5 sec hold Standing Isometric Shoulder Abduction with Doorway - Arm Bent - 1 x daily - 7 x weekly - 1-2 sets - 10 reps - 5 sec hold Standing Isometric Shoulder Extension with Doorway - Arm Bent - 1 x daily - 7 x weekly - 1-2 sets - 10 reps - 5 sec hold

## 2020-03-04 NOTE — Therapy (Signed)
[facility] [place], [place], [address] Phone: [phone]   Fax:  [phone]  Physical Therapy Treatment  Patient Details  Name: Julie Mann MRN: [id number] Date of Birth: 02/02/1946 Referring Provider (PT): Dr. Durward Fortes   Encounter Date: 03/04/2020  PT End of Session - 03/04/20 1405    Visit Number  4    Number of Visits  16    Date for PT Re-Evaluation  04/01/20    Authorization Type  AARP    Authorization Time Period  02/19/2020 - 04/29/2020    PT Start Time  1316    PT Stop Time  1356    PT Time Calculation (min)  40 min    Activity Tolerance  Patient limited by pain    Behavior During Therapy  [facility] for tasks assessed/performed       Past Medical History:  Diagnosis Date  . 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 Arthritis    "hands, fingers, back" (02/27/2014)  . Atrial fibrillation ([place])   . Cat scratch of right hand 02/24/2014  . Dysrhythmia    a fib  . Fall 01/2018  . Fall from horse 01/2018  . Fracture, ankle 02/2019   Right   . High cholesterol   . History of stomach ulcers ~ 1966  . Osteopenia     Past Surgical History:  Procedure Laterality Date  . ANUS SURGERY  2005   "biopsy"  . BREAST CYST EXCISION  1966  . DILATION AND CURETTAGE OF UTERUS  1980's   S/P miscarriage  . EXTERNAL FIXATION LEG Right 03/10/2019   Procedure: OPEN REDUCTION INTERNAL FIXATION RIGHT ANKLE;  Surgeon: Marchia Bond, MD;  Location: [facility];  Service: Orthopedics;  Laterality: Right;  Marland Kitchen VARICOSE VEIN SURGERY Left    left leg    There were no vitals filed for this visit.  Subjective Assessment - 03/04/20 1319    Subjective  shoulder is a little sore, but overall doing well.  feels it's because she's using it more; CBD oil has helped    Limitations  Lifting;House hold activities    Patient Stated Goals  regain function of shoulder    Currently in Pain?  Yes   c/o soreness        [facility] PT  Assessment - 03/04/20 1349      Assessment   Medical Diagnosis  Lt shoulder arthroscopy, SAD, DCR, open biceps tenodesis    Referring Provider (PT)  Dr. Durward Fortes      PROM   Left Shoulder Flexion  143 Degrees    Left Shoulder ABduction  135 Degrees   scaption   Left Shoulder Internal Rotation  70 Degrees    Left Shoulder External Rotation  58 Degrees                   [facility] Adult PT Treatment/Exercise - 03/04/20 1321      Shoulder Exercises: Supine   External Rotation  Left;10 reps;AAROM   1# bar   Flexion  AAROM;10 reps   1# bar     Shoulder Exercises: Pulleys   Flexion  2 minutes    Scaption  2 minutes      Shoulder Exercises: Isometric Strengthening   Flexion Limitations  10x5"    Extension Limitations  10x5"    External Rotation Limitations  10x5"    Internal Rotation Limitations  10x5"    ABduction Limitations  10x5"  Manual Therapy   Joint Mobilization  g2-g3 inferior/distraction mobs in mid range, prom Lt shoulder all planes to tolerance             PT Education - 03/04/20 1405    Education Details  isometrics to HEP    Person(s) Educated  Patient    Methods  Explanation;Demonstration;Handout    Comprehension  Verbalized understanding;Returned demonstration;Need further instruction          PT Long Term Goals - 02/19/20 1459      PT LONG TERM GOAL #1   Title  Patient will demonstrate/report pain at worst less than or equal to 2/10 to facilitate minimal limitation in daily activity secondary to pain symptoms.    Time  8    Period  Weeks    Status  New    Target Date  04/15/20      PT LONG TERM GOAL #2   Title  Patient will demonstrate independent use of home exercise program to facilitate ability to maintain/progress functional gains from skilled physical therapy services.    Time  8    Period  Weeks    Status  New    Target Date  04/15/20      PT LONG TERM GOAL #3   Title  Patient will demonstrate Lt [place] joint mobility WFL  to facilitate usual self care, dressing, reaching overhead at PLOF s limitation due to symptoms.    Time  8    Period  Weeks    Status  New    Target Date  04/15/20      PT LONG TERM GOAL #4   Title  Pt. will demonstrate Lt UE MMT equal to Rt to facilitate usual lifting, carrying, recreational activity (violin) at PLOF.    Time  8    Period  Weeks    Status  New    Target Date  04/15/20      PT LONG TERM GOAL #5   Title  Pt. will demonstrate/report ability to play instrument at PLOF s limitation.    Time  8    Period  Weeks    Status  New    Target Date  04/15/20            Plan - 03/04/20 1407    Clinical Impression Statement  Pt demonstrating improvements in PROM today and overall progressing well with PT.  Will continue to benefit from PT to maximize function.    Examination-Activity Limitations  Bathing;Carry;Lift;Reach Overhead    Armed forces operational officer Activity;Interpersonal Relationship;Yard Work;Meal Prep    Stability/Clinical Decision Making  Stable/Uncomplicated    Rehab Potential  Good    PT Frequency  2x / week    PT Duration  8 weeks    PT Treatment/Interventions  ADLs/Self Care Home Management;Electrical Stimulation;Ultrasound;Traction;Moist Heat;Iontophoresis 4mg /ml Dexamethasone;Gait training;Stair training;Functional mobility training;Therapeutic activities;Therapeutic exercise;Balance training;Neuromuscular re-education;Manual techniques;Patient/family education;Passive range of motion;Dry needling;Joint Manipulations;Spinal Manipulations;Vasopneumatic Device;Taping    PT Next Visit Plan  Manual for pain/mobility, progress PROM, AAROM activity. recheck isometrics    PT Home Exercise Plan  [email or web address]    Consulted and Agree with Plan of Care  Patient       Patient will benefit from skilled therapeutic intervention in order to improve the following deficits and impairments:  Decreased coordination, Decreased range of motion, Impaired  UE functional use, Decreased activity tolerance, Pain, Hypomobility, Decreased mobility, Decreased strength, Postural dysfunction  Visit Diagnosis: Chronic left shoulder pain  Stiffness of left shoulder, not  elsewhere classified  Muscle weakness (generalized)     Problem List Patient Active Problem List   Diagnosis Date Noted  . Biceps tendonosis of left shoulder 02/13/2020  . Impingement syndrome of left shoulder 02/13/2020  . Post-operative state 02/13/2020  . Ankle dislocation, right, initial encounter 03/10/2019  . Trimalleolar fracture of ankle, closed, right, initial encounter   . Chronic left shoulder pain 02/19/2019  . Anxiety 12/17/2016  . 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[place]) 12/17/2016  . Osteopenia 12/17/2016  . Paroxysmal atrial fibrillation ([place]) 12/17/2016  . Pure hypercholesterolemia 12/17/2016  . Rectal cancer ([place]) 07/30/2015      Laureen Abrahams, PT, DPT 03/04/20 2:08 PM    [facility] Physical Therapy [address] [place], [place], [address] Phone: [phone]   Fax:  [phone]  Name: Julie Mann MRN: [id number] Date of Birth: 03/02/1946

## 2020-03-06 ENCOUNTER — Encounter: Payer: Medicare Other | Admitting: Rehabilitative and Restorative Service Providers"

## 2020-03-06 ENCOUNTER — Other Ambulatory Visit: Payer: Self-pay

## 2020-03-06 ENCOUNTER — Ambulatory Visit: Payer: Medicare Other | Admitting: Physical Therapy

## 2020-03-06 DIAGNOSIS — M25512 Pain in left shoulder: Secondary | ICD-10-CM | POA: Diagnosis not present

## 2020-03-06 DIAGNOSIS — M6281 Muscle weakness (generalized): Secondary | ICD-10-CM

## 2020-03-06 DIAGNOSIS — M25612 Stiffness of left shoulder, not elsewhere classified: Secondary | ICD-10-CM | POA: Diagnosis not present

## 2020-03-06 DIAGNOSIS — G8929 Other chronic pain: Secondary | ICD-10-CM

## 2020-03-06 NOTE — Patient Instructions (Signed)
Access Code: [email or web address]: [email or web address]: 04/23/2021Prepared by: Aaron Edelman NelsonExercises  Seated Shoulder External Rotation PROM on Table - 2 x daily - 7 x weekly - 2 sets - 10 reps - 5 sec hold  Standing Shoulder Flexion AAROM with Dowel - 2 x daily - 6 x weekly - 10 reps - 3 sets  Standing Shoulder Abduction AAROM with Dowel - 2 x daily - 6 x weekly - 10 reps - 3 sets  Standing Shoulder External Rotation AAROM with Dowel - 2 x daily - 6 x weekly - 10 reps - 3 sets  Isometric Shoulder Flexion at Wall - 1 x daily - 7 x weekly - 1-2 sets - 10 reps - 5 sec hold  Standing Isometric Shoulder Internal Rotation at Doorway - 1 x daily - 7 x weekly - 1-2 sets - 10 reps - 5 sec hold  Standing Isometric Shoulder External Rotation with Doorway - 1 x daily - 7 x weekly - 1-2 sets - 10 reps - 5 sec hold  Standing Isometric Shoulder Abduction with Doorway - Arm Bent - 1 x daily - 7 x weekly - 1-2 sets - 10 reps - 5 sec hold  Shoulder Flexion Wall Walk - 2 x daily - 6 x weekly - 1 sets - 5 reps  Standing Shoulder Abduction Finger Walk at Wall - 2 x daily - 6 x weekly - 1 sets - 5 reps  Standing Shoulder Extension with Dowel - 2 x daily - 6 x weekly - 1-2 sets - 10 reps

## 2020-03-06 NOTE — Therapy (Signed)
[facility] [place], [place], [address] Phone: [phone]   Fax:  [phone]  Physical Therapy Treatment  Patient Details  Name: Julie Mann MRN: [id number] Date of Birth: November 06, 1946 Referring Provider (PT): Dr. Durward Fortes   Encounter Date: 03/06/2020  PT End of Session - 03/06/20 1407    Visit Number  5    Number of Visits  16    Date for PT Re-Evaluation  04/01/20    Authorization Type  AARP    Authorization Time Period  02/19/2020 - 04/29/2020    PT Start Time  1315    PT Stop Time  1401    PT Time Calculation (min)  46 min    Activity Tolerance  Patient tolerated treatment well    Behavior During Therapy  [facility] for tasks assessed/performed       Past Medical History:  Diagnosis Date  . 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 Arthritis    "hands, fingers, back" (02/27/2014)  . Atrial fibrillation ([place])   . Cat scratch of right hand 02/24/2014  . Dysrhythmia    a fib  . Fall 01/2018  . Fall from horse 01/2018  . Fracture, ankle 02/2019   Right   . High cholesterol   . History of stomach ulcers ~ 1966  . Osteopenia     Past Surgical History:  Procedure Laterality Date  . ANUS SURGERY  2005   "biopsy"  . BREAST CYST EXCISION  1966  . DILATION AND CURETTAGE OF UTERUS  1980's   S/P miscarriage  . EXTERNAL FIXATION LEG Right 03/10/2019   Procedure: OPEN REDUCTION INTERNAL FIXATION RIGHT ANKLE;  Surgeon: Marchia Bond, MD;  Location: [facility];  Service: Orthopedics;  Laterality: Right;  Marland Kitchen VARICOSE VEIN SURGERY Left    left leg    There were no vitals filed for this visit.  Subjective Assessment - 03/06/20 1338    Subjective  shoulder is overall doing better, no pain at rest, 3/10 with actvity, She hopes to play violin at a wedding in 2 weeks    Limitations  Lifting;House hold activities    Patient Stated Goals  regain function of shoulder    Pain Onset  1 to 4 weeks ago                        [facility] Adult PT Treatment/Exercise - 03/06/20 0001      Exercises   Exercises  Other Exercises    Other Exercises   violin simulation using one lb bar practiced set up seated in sling with elbow stabilized against torso holding bar as if was violin and pretending to play for 2 minutes without pain with this      Shoulder Exercises: Standing   Other Standing Exercises  AAROM for shoulder flexion, abduction, ER, IR, extension all with 1 lb bar X 15 reps ea    Other Standing Exercises  Wall ladder X 5 reps flexion and 5 reps scaption. Isometrics for IR/ER/abd 5 sec X 5 reps ea      Shoulder Exercises: Pulleys   Flexion  2 minutes    Scaption  2 minutes      Shoulder Exercises: Stretch   Corner Stretch  5 reps;20 seconds    Corner Stretch Limitations  for Lt shoulder ER in doorway             PT Education - 03/06/20 1406  Education Details  HEP progression and revision, education about slow return to violin playing using sling to support her arm if needed and only playing 2 minutes the first day and if no pain can increase a minute each day.    Person(s) Educated  Patient    Methods  Explanation    Comprehension  Verbalized understanding          PT Long Term Goals - 02/19/20 1459      PT LONG TERM GOAL #1   Title  Patient will demonstrate/report pain at worst less than or equal to 2/10 to facilitate minimal limitation in daily activity secondary to pain symptoms.    Time  8    Period  Weeks    Status  New    Target Date  04/15/20      PT LONG TERM GOAL #2   Title  Patient will demonstrate independent use of home exercise program to facilitate ability to maintain/progress functional gains from skilled physical therapy services.    Time  8    Period  Weeks    Status  New    Target Date  04/15/20      PT LONG TERM GOAL #3   Title  Patient will demonstrate Lt [place] joint mobility WFL to facilitate usual self care, dressing, reaching  overhead at PLOF s limitation due to symptoms.    Time  8    Period  Weeks    Status  New    Target Date  04/15/20      PT LONG TERM GOAL #4   Title  Pt. will demonstrate Lt UE MMT equal to Rt to facilitate usual lifting, carrying, recreational activity (violin) at PLOF.    Time  8    Period  Weeks    Status  New    Target Date  04/15/20      PT LONG TERM GOAL #5   Title  Pt. will demonstrate/report ability to play instrument at PLOF s limitation.    Time  8    Period  Weeks    Status  New    Target Date  04/15/20            Plan - 03/06/20 1408    Clinical Impression Statement  She is overall having less pain and improved AROM. Progressed her HEP today to include more standing AAROM exercises for more strength progress compared to supine and she was able to show good return demonstration and able to keep them in tolerable pain ROM. Spent portion of session on education about slow gentle return to playing violin as she has upcoming wedding to play in 2 weeks away. She was encouraged to bring her violin to next session to PT can assess her set up and response to small amount of playing only.    Examination-Activity Limitations  Bathing;Carry;Lift;Reach Overhead    Armed forces operational officer Activity;Interpersonal Relationship;Yard Work;Meal Prep    Stability/Clinical Decision Making  Stable/Uncomplicated    Rehab Potential  Good    PT Frequency  2x / week    PT Duration  8 weeks    PT Treatment/Interventions  ADLs/Self Care Home Management;Electrical Stimulation;Ultrasound;Traction;Moist Heat;Iontophoresis 4mg /ml Dexamethasone;Gait training;Stair training;Functional mobility training;Therapeutic activities;Therapeutic exercise;Balance training;Neuromuscular re-education;Manual techniques;Patient/family education;Passive range of motion;Dry needling;Joint Manipulations;Spinal Manipulations;Vasopneumatic Device;Taping    PT Next Visit Plan  Manual for  pain/mobility, progress PROM, AAROM activity. How is small amount of violin training going?    PT Home Exercise Plan  [phone]  Consulted and Agree with Plan of Care  Patient       Patient will benefit from skilled therapeutic intervention in order to improve the following deficits and impairments:  Decreased coordination, Decreased range of motion, Impaired UE functional use, Decreased activity tolerance, Pain, Hypomobility, Decreased mobility, Decreased strength, Postural dysfunction  Visit Diagnosis: Chronic left shoulder pain  Stiffness of left shoulder, not elsewhere classified  Muscle weakness (generalized)     Problem List Patient Active Problem List   Diagnosis Date Noted  . Biceps tendonosis of left shoulder 02/13/2020  . Impingement syndrome of left shoulder 02/13/2020  . Post-operative state 02/13/2020  . Ankle dislocation, right, initial encounter 03/10/2019  . Trimalleolar fracture of ankle, closed, right, initial encounter   . Chronic left shoulder pain 02/19/2019  . Anxiety 12/17/2016  . 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[place]) 12/17/2016  . Osteopenia 12/17/2016  . Paroxysmal atrial fibrillation ([place]) 12/17/2016  . Pure hypercholesterolemia 12/17/2016  . Rectal cancer [facility]) 07/30/2015    Silvestre Mesi 03/06/2020, 2:17 PM  [facility] [address] [place], [place], [address] Phone: [phone]   Fax:  [phone]  Name: Julie Mann MRN: [id number] Date of Birth: 25-Jul-1946

## 2020-03-09 ENCOUNTER — Encounter: Payer: Medicare Other | Admitting: Rehabilitative and Restorative Service Providers"

## 2020-03-09 ENCOUNTER — Encounter: Payer: Medicare Other | Admitting: Physical Therapy

## 2020-03-09 ENCOUNTER — Other Ambulatory Visit: Payer: Self-pay

## 2020-03-09 ENCOUNTER — Ambulatory Visit: Payer: Medicare Other | Admitting: Physical Therapy

## 2020-03-09 DIAGNOSIS — M6281 Muscle weakness (generalized): Secondary | ICD-10-CM | POA: Diagnosis not present

## 2020-03-09 DIAGNOSIS — M25612 Stiffness of left shoulder, not elsewhere classified: Secondary | ICD-10-CM | POA: Diagnosis not present

## 2020-03-09 DIAGNOSIS — G8929 Other chronic pain: Secondary | ICD-10-CM | POA: Diagnosis not present

## 2020-03-09 DIAGNOSIS — M25512 Pain in left shoulder: Secondary | ICD-10-CM

## 2020-03-09 NOTE — Therapy (Signed)
[facility] [address] [place], [place], [address] Phone: [phone]   Fax:  [phone]  Physical Therapy Treatment  Patient Details  Name: Julie Mann MRN: [id number] Date of Birth: 19-Nov-1945 Referring Provider (PT): Dr. Durward Fortes   Encounter Date: 03/09/2020  PT End of Session - 03/09/20 1441    Visit Number  6    Number of Visits  16    Date for PT Re-Evaluation  04/01/20    Authorization Type  AARP    Authorization Time Period  02/19/2020 - 04/29/2020    PT Start Time  1400    PT Stop Time  1441    PT Time Calculation (min)  41 min    Activity Tolerance  Patient tolerated treatment well    Behavior During Therapy  [facility] for tasks assessed/performed       Past Medical History:  Diagnosis Date  . 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 Arthritis    "hands, fingers, back" (02/27/2014)  . Atrial fibrillation ([place])   . Cat scratch of right hand 02/24/2014  . Dysrhythmia    a fib  . Fall 01/2018  . Fall from horse 01/2018  . Fracture, ankle 02/2019   Right   . High cholesterol   . History of stomach ulcers ~ 1966  . Osteopenia     Past Surgical History:  Procedure Laterality Date  . ANUS SURGERY  2005   "biopsy"  . BREAST CYST EXCISION  1966  . DILATION AND CURETTAGE OF UTERUS  1980's   S/P miscarriage  . EXTERNAL FIXATION LEG Right 03/10/2019   Procedure: OPEN REDUCTION INTERNAL FIXATION RIGHT ANKLE;  Surgeon: Marchia Bond, MD;  Location: [facility];  Service: Orthopedics;  Laterality: Right;  Marland Kitchen VARICOSE VEIN SURGERY Left    left leg    There were no vitals filed for this visit.  Subjective Assessment - 03/09/20 1434    Subjective  shoulder is sore, she did practice violin but only 2-3 minutes at a time as recommended with PT.    Limitations  Lifting;House hold activities    Patient Stated Goals  regain function of shoulder    Pain Onset  1 to 4 weeks ago                        [facility] Adult PT Treatment/Exercise - 03/09/20 0001      Exercises   Other Exercises   she brought her violin in and practiced playing 3 min then 2 min with fair tolerance. Will have her try to increase about a minute each day.      Shoulder Exercises: Standing   External Rotation  AAROM;Left;20 reps    Flexion  AAROM;10 reps    ABduction  AAROM;Left;10 reps    Other Standing Exercises  elbow flexion AROM 2X10    Other Standing Exercises  Wall ladder X 5 reps flexion and 5 reps scaption.      Shoulder Exercises: Pulleys   Flexion  2 minutes    Scaption  2 minutes      Shoulder Exercises: Stretch   Corner Stretch  5 reps;20 seconds    Corner Stretch Limitations  for Lt shoulder ER in doorway                  PT Long Term Goals - 02/19/20 1459      PT LONG TERM GOAL #1   Title  Patient will demonstrate/report pain at worst less than or equal to 2/10 to facilitate minimal limitation in daily activity secondary to pain symptoms.    Time  8    Period  Weeks    Status  New    Target Date  04/15/20      PT LONG TERM GOAL #2   Title  Patient will demonstrate independent use of home exercise program to facilitate ability to maintain/progress functional gains from skilled physical therapy services.    Time  8    Period  Weeks    Status  New    Target Date  04/15/20      PT LONG TERM GOAL #3   Title  Patient will demonstrate Lt [place] joint mobility WFL to facilitate usual self care, dressing, reaching overhead at PLOF s limitation due to symptoms.    Time  8    Period  Weeks    Status  New    Target Date  04/15/20      PT LONG TERM GOAL #4   Title  Pt. will demonstrate Lt UE MMT equal to Rt to facilitate usual lifting, carrying, recreational activity (violin) at PLOF.    Time  8    Period  Weeks    Status  New    Target Date  04/15/20      PT LONG TERM GOAL #5   Title  Pt. will demonstrate/report ability to play instrument at PLOF s  limitation.    Time  8    Period  Weeks    Status  New    Target Date  04/15/20            Plan - 03/09/20 1445    Clinical Impression Statement  practiced violin set up for a few minutes with fair tolerance. Overall shoulder is sore so held on some of the strengthening exericses as she will be playing violin for a few minutes each day for exercise anyway. Continue POC    Examination-Activity Limitations  Bathing;Carry;Lift;Reach Overhead    Examination-Participation Restrictions  Community Activity;Interpersonal Relationship;Yard Work;Meal Prep    Stability/Clinical Decision Making  Stable/Uncomplicated    Rehab Potential  Good    PT Frequency  2x / week    PT Duration  8 weeks    PT Treatment/Interventions  ADLs/Self Care Home Management;Electrical Stimulation;Ultrasound;Traction;Moist Heat;Iontophoresis 4mg /ml Dexamethasone;Gait training;Stair training;Functional mobility training;Therapeutic activities;Therapeutic exercise;Balance training;Neuromuscular re-education;Manual techniques;Patient/family education;Passive range of motion;Dry needling;Joint Manipulations;Spinal Manipulations;Vasopneumatic Device;Taping    PT Next Visit Plan  Manual for pain/mobility, progress PROM, AAROM activity. How is small amount of violin training going?    PT Home Exercise Plan  [id number]    Consulted and Agree with Plan of Care  Patient       Patient will benefit from skilled therapeutic intervention in order to improve the following deficits and impairments:  Decreased coordination, Decreased range of motion, Impaired UE functional use, Decreased activity tolerance, Pain, Hypomobility, Decreased mobility, Decreased strength, Postural dysfunction  Visit Diagnosis: Chronic left shoulder pain  Stiffness of left shoulder, not elsewhere classified  Muscle weakness (generalized)     Problem List Patient Active Problem List   Diagnosis Date Noted  . Biceps tendonosis of left shoulder  02/13/2020  . Impingement syndrome of left shoulder 02/13/2020  . Post-operative state 02/13/2020  . Ankle dislocation, right, initial encounter 03/10/2019  . Trimalleolar fracture of ankle, closed, right, initial encounter   . Chronic left shoulder pain 02/19/2019  . Anxiety 12/17/2016  . Major  depression in remission ([place]) 12/17/2016  . Osteopenia 12/17/2016  . Paroxysmal atrial fibrillation ([place]) 12/17/2016  . Pure hypercholesterolemia 12/17/2016  . Rectal cancer [facility]) 07/30/2015    Silvestre Mesi 03/09/2020, 2:46 PM  [facility] [address] [place], [place], [address] Phone: [phone]   Fax:  [phone]  Name: TRAMIYA ZELLAR MRN: [id number] Date of Birth: 1946/01/04

## 2020-03-11 ENCOUNTER — Ambulatory Visit: Payer: Medicare Other | Admitting: Orthopaedic Surgery

## 2020-03-11 ENCOUNTER — Other Ambulatory Visit: Payer: Self-pay

## 2020-03-11 ENCOUNTER — Encounter: Payer: Self-pay | Admitting: Orthopaedic Surgery

## 2020-03-11 VITALS — Ht 67.0 in | Wt 137.0 lb

## 2020-03-11 DIAGNOSIS — M67814 Other specified disorders of tendon, left shoulder: Secondary | ICD-10-CM

## 2020-03-11 NOTE — Progress Notes (Signed)
Office Visit Note   Patient: Julie Mann           Date of Birth: Feb 03, 1946           MRN: [id number] Visit Date: 03/11/2020              Requested by: Julie Manes, MD [address] [place],  [place] [address] PCP: Julie Manes, MD   Assessment & Plan: Visit Diagnoses:  1. Biceps tendonosis of left shoulder     Plan: 1 month status post arthroscopic SCD, DCR and open biceps tenodesis.  Doing quite well and happy with her present progress.  Continues to be followed in physical therapy.  Using her violin up to 5 minutes twice a day and seems to be tolerating that.  No longer using the sling.  Incisions are healing without any problem.  I was able to abduct 90 degrees and flex about 130.  Should continue working with her exercises at home and in therapy and return in 1 month  Follow-Up Instructions: Return in about 1 month (around 04/10/2020).   Orders:  No orders of the defined types were placed in this encounter.  No orders of the defined types were placed in this encounter.     Procedures: No procedures performed   Clinical Data: No additional findings.   Subjective: Chief Complaint  Patient presents with  . Left Shoulder - Follow-up    Left shoulder scope DOS 02/06/2020  Patient presents today for follow up on her left shoulder. She had a left shoulder arthroscopy on 02/06/2020. She is now one month out from surgery. Doing well. No complaints. She is going to therapy and has been able to play her violin for limited amounts of time every other day.  Julie Mann relates that she does not have the pain that she had preoperatively  HPI  Review of Systems   Objective: Vital Signs: Ht 5\' 7"  (1.702 m)   Wt 137 lb (62.1 kg)   LMP 11/14/1992   BMI 21.46 kg/m   Physical Exam  Ortho Exam no longer using the sling.  I can abduct 90 degrees and flex passively about 130 degrees.  Incisions of healed without problem.  Biceps appears to be normally positioned.   Good grip and release.  Full range of motion of elbow.  No impingement  Specialty Comments:  No specialty comments available.  Imaging: No results found.   PMFS History: Patient Active Problem List   Diagnosis Date Noted  . Biceps tendonosis of left shoulder 02/13/2020  . Impingement syndrome of left shoulder 02/13/2020  . Post-operative state 02/13/2020  . Ankle dislocation, right, initial encounter 03/10/2019  . Trimalleolar fracture of ankle, closed, right, initial encounter   . Chronic left shoulder pain 02/19/2019  . Anxiety 12/17/2016  . 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[place]) 12/17/2016  . Osteopenia 12/17/2016  . Paroxysmal atrial fibrillation ([place]) 12/17/2016  . Pure hypercholesterolemia 12/17/2016  . Rectal cancer ([place]) 07/30/2015   Past Medical History:  Diagnosis Date  . 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 Arthritis    "hands, fingers, back" (02/27/2014)  . Atrial fibrillation ([place])   . Cat scratch of right hand 02/24/2014  . Dysrhythmia    a fib  . Fall 01/2018  . Fall from horse 01/2018  . Fracture, ankle 02/2019   Right   . High cholesterol   . History of stomach ulcers ~ 1966  . Osteopenia  Family History  Problem Relation Age of Onset  . Breast cancer Maternal Aunt   . Breast cancer Maternal Grandmother   . Heart disease Mother   . Stroke Father   . Rectal cancer Paternal Grandmother   . Breast cancer Sister 6  . Breast cancer Sister 30    Past Surgical History:  Procedure Laterality Date  . ANUS SURGERY  2005   "biopsy"  . BREAST CYST EXCISION  1966  . DILATION AND CURETTAGE OF UTERUS  1980's   S/P miscarriage  . EXTERNAL FIXATION LEG Right 03/10/2019   Procedure: OPEN REDUCTION INTERNAL FIXATION RIGHT ANKLE;  Surgeon: Marchia Bond, MD;  Location: [facility];  Service: Orthopedics;  Laterality: Right;  Marland Kitchen VARICOSE VEIN SURGERY Left    left leg   Social History   Occupational History  . Not on file  Tobacco  Use  . Smoking status: Former Smoker    Packs/day: 1.00    Years: 30.00    Pack years: 30.00    Types: Cigarettes  . Smokeless tobacco: Never Used  Substance and Sexual Activity  . Alcohol use: No  . Drug use: No  . Sexual activity: Yes    Partners: Male    Birth control/protection: Post-menopausal

## 2020-03-13 ENCOUNTER — Encounter: Payer: Self-pay | Admitting: Physical Therapy

## 2020-03-13 ENCOUNTER — Ambulatory Visit: Payer: Medicare Other | Admitting: Physical Therapy

## 2020-03-13 ENCOUNTER — Encounter: Payer: Medicare Other | Admitting: Rehabilitative and Restorative Service Providers"

## 2020-03-13 ENCOUNTER — Other Ambulatory Visit: Payer: Self-pay

## 2020-03-13 DIAGNOSIS — M25612 Stiffness of left shoulder, not elsewhere classified: Secondary | ICD-10-CM | POA: Diagnosis not present

## 2020-03-13 DIAGNOSIS — M6281 Muscle weakness (generalized): Secondary | ICD-10-CM

## 2020-03-13 DIAGNOSIS — M25512 Pain in left shoulder: Secondary | ICD-10-CM | POA: Diagnosis not present

## 2020-03-13 DIAGNOSIS — G8929 Other chronic pain: Secondary | ICD-10-CM | POA: Diagnosis not present

## 2020-03-13 NOTE — Therapy (Signed)
[place], [place], [address] Phone: [phone]   Fax:  [phone]  Physical Therapy Treatment  Patient Details  Name: Julie Mann MRN: [id number] Date of Birth: 26-Dec-1945 Referring Provider (PT): Dr. Durward Fortes   Encounter Date: 03/13/2020  PT End of Session - 03/13/20 1114    Visit Number  7    Number of Visits  16    Date for PT Re-Evaluation  04/01/20    Authorization Type  AARP    Authorization Time Period  02/19/2020 - 04/29/2020    PT Start Time  1015    PT Stop Time  1100    PT Time Calculation (min)  45 min    Activity Tolerance  Patient tolerated treatment well    Behavior During Therapy  [facility] for tasks assessed/performed       Past Medical History:  Diagnosis Date  . 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 Arthritis    "hands, fingers, back" (02/27/2014)  . Atrial fibrillation ([place])   . Cat scratch of right hand 02/24/2014  . Dysrhythmia    a fib  . Fall 01/2018  . Fall from horse 01/2018  . Fracture, ankle 02/2019   Right   . High cholesterol   . History of stomach ulcers ~ 1966  . Osteopenia     Past Surgical History:  Procedure Laterality Date  . ANUS SURGERY  2005   "biopsy"  . BREAST CYST EXCISION  1966  . DILATION AND CURETTAGE OF UTERUS  1980's   S/P miscarriage  . EXTERNAL FIXATION LEG Right 03/10/2019   Procedure: OPEN REDUCTION INTERNAL FIXATION RIGHT ANKLE;  Surgeon: Marchia Bond, MD;  Location: [place];  Service: Orthopedics;  Laterality: Right;  Marland Kitchen VARICOSE VEIN SURGERY Left    left leg    There were no vitals filed for this visit.  Subjective Assessment - 03/13/20 1106    Subjective  she relays MD was pleased, she has been practicing violin 5 minutes a day 2 times per day without pain. Relays no pain at rest no more than 3/10 with activity today    Limitations  Lifting;House hold activities    Patient Stated Goals  regain function of shoulder     Pain Onset  1 to 4 weeks ago       [facility] Adult PT Treatment/Exercise - 03/13/20 0001      Exercises   Other Exercises   gentle progression of time spent practicing violin about one more minute each day and to alternate days of exercise for her shoulder      Shoulder Exercises: Supine   Flexion  AAROM;15 reps      Shoulder Exercises: Standing   External Rotation  Left;20 reps    Theraband Level (Shoulder External Rotation)  Level 2 (Red)    Internal Rotation  Left;20 reps    Theraband Level (Shoulder Internal Rotation)  Level 2 (Red)    Flexion Limitations  placing styrofoam cup into top cabinet X 15 rpes     Other Standing Exercises  Lt shoulder stabiility ball rolls at 90 deg flexion on wall X 15 reps for circles CW/CCW 2 lb ball. Then farmers carry with 2 lb ball around clinic    Other Standing Exercises  Wall ladder X 5 reps flexion and 5 reps scaption.      Shoulder Exercises: Pulleys   Flexion  2 minutes    Scaption  2 minutes  Manual Therapy   Manual therapy comments  Lt shoulder PROM all planes to tolerance                  PT Long Term Goals - 02/19/20 1459      PT LONG TERM GOAL #1   Title  Patient will demonstrate/report pain at worst less than or equal to 2/10 to facilitate minimal limitation in daily activity secondary to pain symptoms.    Time  8    Period  Weeks    Status  New    Target Date  04/15/20      PT LONG TERM GOAL #2   Title  Patient will demonstrate independent use of home exercise program to facilitate ability to maintain/progress functional gains from skilled physical therapy services.    Time  8    Period  Weeks    Status  New    Target Date  04/15/20      PT LONG TERM GOAL #3   Title  Patient will demonstrate Lt [place] joint mobility WFL to facilitate usual self care, dressing, reaching overhead at PLOF s limitation due to symptoms.    Time  8    Period  Weeks    Status  New    Target Date  04/15/20      PT LONG TERM GOAL #4    Title  Pt. will demonstrate Lt UE MMT equal to Rt to facilitate usual lifting, carrying, recreational activity (violin) at PLOF.    Time  8    Period  Weeks    Status  New    Target Date  04/15/20      PT LONG TERM GOAL #5   Title  Pt. will demonstrate/report ability to play instrument at PLOF s limitation.    Time  8    Period  Weeks    Status  New    Target Date  04/15/20            Plan - 03/13/20 1115    Clinical Impression Statement  She appears to be progressing well with her stretching, strength, and tolerance playing violin. Again reviewed gentle progression of activity tolerance to practicing her violin. Assess how she is doing with this next session.    Examination-Activity Limitations  Bathing;Carry;Lift;Reach Overhead    Armed forces operational officer Activity;Interpersonal Relationship;Yard Work;Meal Prep    Stability/Clinical Decision Making  Stable/Uncomplicated    Rehab Potential  Good    PT Frequency  2x / week    PT Duration  8 weeks    PT Treatment/Interventions  ADLs/Self Care Home Management;Electrical Stimulation;Ultrasound;Traction;Moist Heat;Iontophoresis 4mg /ml Dexamethasone;Gait training;Stair training;Functional mobility training;Therapeutic activities;Therapeutic exercise;Balance training;Neuromuscular re-education;Manual techniques;Patient/family education;Passive range of motion;Dry needling;Joint Manipulations;Spinal Manipulations;Vasopneumatic Device;Taping    PT Next Visit Plan  Can begin resisted bicep work on 03/19/20. Manual for pain/mobility, progress PROM, AROM activity, RTC strength. How is small amount of violin training going?    PT Home Exercise Plan  [id number]    Consulted and Agree with Plan of Care  Patient       Patient will benefit from skilled therapeutic intervention in order to improve the following deficits and impairments:  Decreased coordination, Decreased range of motion, Impaired UE functional use, Decreased  activity tolerance, Pain, Hypomobility, Decreased mobility, Decreased strength, Postural dysfunction  Visit Diagnosis: Chronic left shoulder pain  Stiffness of left shoulder, not elsewhere classified  Muscle weakness (generalized)     Problem List Patient Active Problem List   Diagnosis  Date Noted  . Biceps tendonosis of left shoulder 02/13/2020  . Impingement syndrome of left shoulder 02/13/2020  . Post-operative state 02/13/2020  . Ankle dislocation, right, initial encounter 03/10/2019  . Trimalleolar fracture of ankle, closed, right, initial encounter   . Chronic left shoulder pain 02/19/2019  . Anxiety 12/17/2016  . 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[place]) 12/17/2016  . Osteopenia 12/17/2016  . Paroxysmal atrial fibrillation ([place]) 12/17/2016  . Pure hypercholesterolemia 12/17/2016  . Rectal cancer ([place]) 07/30/2015    Silvestre Mesi 03/13/2020, [facility] [address] [place], [place], [address] Phone: [phone]   Fax:  [phone]  Name: Julie Mann MRN: [id number] Date of Birth: 29-Oct-1946

## 2020-03-16 ENCOUNTER — Encounter: Payer: Self-pay | Admitting: Physical Therapy

## 2020-03-16 ENCOUNTER — Ambulatory Visit: Payer: Medicare Other | Admitting: Physical Therapy

## 2020-03-16 ENCOUNTER — Other Ambulatory Visit: Payer: Self-pay

## 2020-03-16 DIAGNOSIS — M25512 Pain in left shoulder: Secondary | ICD-10-CM

## 2020-03-16 DIAGNOSIS — M6281 Muscle weakness (generalized): Secondary | ICD-10-CM | POA: Diagnosis not present

## 2020-03-16 DIAGNOSIS — G8929 Other chronic pain: Secondary | ICD-10-CM

## 2020-03-16 DIAGNOSIS — M25612 Stiffness of left shoulder, not elsewhere classified: Secondary | ICD-10-CM

## 2020-03-16 NOTE — Therapy (Signed)
[place], [place], [address] Phone: [phone]   Fax:  [phone]  Physical Therapy Treatment  Patient Details  Name: Julie Mann MRN: [id number] Date of Birth: 1946/05/19 Referring Provider (PT): Dr. Durward Fortes   Encounter Date: 03/16/2020  PT End of Session - 03/16/20 1423    Visit Number  8    Number of Visits  16    Date for PT Re-Evaluation  04/01/20    Authorization Type  AARP    Authorization Time Period  02/19/2020 - 04/29/2020    PT Start Time  1345    PT Stop Time  1424    PT Time Calculation (min)  39 min    Activity Tolerance  Patient tolerated treatment well    Behavior During Therapy  [facility] for tasks assessed/performed       Past Medical History:  Diagnosis Date  . 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 Arthritis    "hands, fingers, back" (02/27/2014)  . Atrial fibrillation ([place])   . Cat scratch of right hand 02/24/2014  . Dysrhythmia    a fib  . Fall 01/2018  . Fall from horse 01/2018  . Fracture, ankle 02/2019   Right   . High cholesterol   . History of stomach ulcers ~ 1966  . Osteopenia     Past Surgical History:  Procedure Laterality Date  . ANUS SURGERY  2005   "biopsy"  . BREAST CYST EXCISION  1966  . DILATION AND CURETTAGE OF UTERUS  1980's   S/P miscarriage  . EXTERNAL FIXATION LEG Right 03/10/2019   Procedure: OPEN REDUCTION INTERNAL FIXATION RIGHT ANKLE;  Surgeon: Marchia Bond, MD;  Location: [place];  Service: Orthopedics;  Laterality: Right;  Marland Kitchen VARICOSE VEIN SURGERY Left    left leg    There were no vitals filed for this visit.  Subjective Assessment - 03/16/20 1348    Subjective  working on the violin, not doing more than 15 min.  feels confident she will be ready to play for a wedding she has scheduled.    Limitations  Lifting;House hold activities    Patient Stated Goals  regain function of shoulder    Currently in Pain?  Yes    Pain Score  2     it's just irritating   Pain Location  Shoulder    Pain Orientation  Left    Pain Descriptors / Indicators  Aching    Pain Type  Surgical pain;Acute pain    Pain Onset  1 to 4 weeks ago    Pain Frequency  Constant    Aggravating Factors   active movements    Pain Relieving Factors  pain medicine                       [facility] Adult PT Treatment/Exercise - 03/16/20 1350      Shoulder Exercises: Supine   Flexion  Left;10 reps;Weights    Shoulder Flexion Weight (lbs)  1      Shoulder Exercises: Sidelying   External Rotation  Left;10 reps;Weights   2 sets   External Rotation Weight (lbs)  1    ABduction  Left;10 reps      Shoulder Exercises: Standing   External Rotation  Left;20 reps    Theraband Level (Shoulder External Rotation)  Level 3 (Green)    Internal Rotation  Left;20 reps    Theraband Level (Shoulder Internal Rotation)  Level 3 (Green)    Extension  20 reps   pink sports cord   [employer];Theraband   pink sports cord   Other Standing Exercises  Wall ladder X 10 reps flexion and 10 reps scaption.      Shoulder Exercises: Pulleys   Flexion  2 minutes    Scaption  2 minutes      Manual Therapy   Manual therapy comments  Lt shoulder PROM all planes to tolerance                  PT Long Term Goals - 02/19/20 1459      PT LONG TERM GOAL #1   Title  Patient will demonstrate/report pain at worst less than or equal to 2/10 to facilitate minimal limitation in daily activity secondary to pain symptoms.    Time  8    Period  Weeks    Status  New    Target Date  04/15/20      PT LONG TERM GOAL #2   Title  Patient will demonstrate independent use of home exercise program to facilitate ability to maintain/progress functional gains from skilled physical therapy services.    Time  8    Period  Weeks    Status  New    Target Date  04/15/20      PT LONG TERM GOAL #3   Title  Patient will demonstrate Lt [place] joint mobility WFL to  facilitate usual self care, dressing, reaching overhead at PLOF s limitation due to symptoms.    Time  8    Period  Weeks    Status  New    Target Date  04/15/20      PT LONG TERM GOAL #4   Title  Pt. will demonstrate Lt UE MMT equal to Rt to facilitate usual lifting, carrying, recreational activity (violin) at PLOF.    Time  8    Period  Weeks    Status  New    Target Date  04/15/20      PT LONG TERM GOAL #5   Title  Pt. will demonstrate/report ability to play instrument at PLOF s limitation.    Time  8    Period  Weeks    Status  New    Target Date  04/15/20            Plan - 03/16/20 1424    Clinical Impression Statement  Pt tolerated session well today adding more active strengthening exercises today.  Will continue to benefit from PT to maximize function.    Examination-Activity Limitations  Bathing;Carry;Lift;Reach Overhead    Armed forces operational officer Activity;Interpersonal Relationship;Yard Work;Meal Prep    Stability/Clinical Decision Making  Stable/Uncomplicated    Rehab Potential  Good    PT Frequency  2x / week    PT Duration  8 weeks    PT Treatment/Interventions  ADLs/Self Care Home Management;Electrical Stimulation;Ultrasound;Traction;Moist Heat;Iontophoresis 4mg /ml Dexamethasone;Gait training;Stair training;Functional mobility training;Therapeutic activities;Therapeutic exercise;Balance training;Neuromuscular re-education;Manual techniques;Patient/family education;Passive range of motion;Dry needling;Joint Manipulations;Spinal Manipulations;Vasopneumatic Device;Taping    PT Next Visit Plan  Can begin resisted bicep work on 03/19/20. Manual for pain/mobility, progress PROM, AROM activity, RTC strength.    PT Home Exercise Plan  [id number]    Consulted and Agree with Plan of Care  Patient       Patient will benefit from skilled therapeutic intervention in order to improve the following deficits and impairments:  Decreased coordination,  Decreased range of motion,  Impaired UE functional use, Decreased activity tolerance, Pain, Hypomobility, Decreased mobility, Decreased strength, Postural dysfunction  Visit Diagnosis: Chronic left shoulder pain  Stiffness of left shoulder, not elsewhere classified  Muscle weakness (generalized)     Problem List Patient Active Problem List   Diagnosis Date Noted  . Biceps tendonosis of left shoulder 02/13/2020  . Impingement syndrome of left shoulder 02/13/2020  . Post-operative state 02/13/2020  . Ankle dislocation, right, initial encounter 03/10/2019  . Trimalleolar fracture of ankle, closed, right, initial encounter   . Chronic left shoulder pain 02/19/2019  . Anxiety 12/17/2016  . 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[place]) 12/17/2016  . Osteopenia 12/17/2016  . Paroxysmal atrial fibrillation ([place]) 12/17/2016  . Pure hypercholesterolemia 12/17/2016  . Rectal cancer ([place]) 07/30/2015       Laureen Abrahams, PT, DPT 03/16/20 2:26 PM     [facility] [address] [place], [place], [address] Phone: [phone]   Fax:  [phone]  Name: PAITIN KALLIS MRN: [id number] Date of Birth: [birth year]

## 2020-03-20 ENCOUNTER — Encounter: Payer: Self-pay | Admitting: Physical Therapy

## 2020-03-20 ENCOUNTER — Other Ambulatory Visit: Payer: Self-pay

## 2020-03-20 ENCOUNTER — Ambulatory Visit: Payer: Medicare Other | Admitting: Physical Therapy

## 2020-03-20 DIAGNOSIS — M25512 Pain in left shoulder: Secondary | ICD-10-CM

## 2020-03-20 DIAGNOSIS — G8929 Other chronic pain: Secondary | ICD-10-CM | POA: Diagnosis not present

## 2020-03-20 DIAGNOSIS — M6281 Muscle weakness (generalized): Secondary | ICD-10-CM | POA: Diagnosis not present

## 2020-03-20 DIAGNOSIS — M25612 Stiffness of left shoulder, not elsewhere classified: Secondary | ICD-10-CM | POA: Diagnosis not present

## 2020-03-20 NOTE — Patient Instructions (Signed)
Access Code: [email or web address] URL: [email or web address] Date: 03/20/2020 Prepared by: Faustino Congress  Exercises Seated Shoulder External Rotation PROM on Table - 2 x daily - 7 x weekly - 2 sets - 10 reps - 5 sec hold Standing Shoulder Flexion AAROM with Dowel - 2 x daily - 6 x weekly - 10 reps - 3 sets Standing Shoulder Abduction AAROM with Dowel - 2 x daily - 6 x weekly - 10 reps - 3 sets Standing Shoulder External Rotation AAROM with Dowel - 2 x daily - 6 x weekly - 10 reps - 3 sets Shoulder Flexion Wall Walk - 2 x daily - 6 x weekly - 1 sets - 5 reps Standing Shoulder Abduction Finger Walk at Wall - 2 x daily - 6 x weekly - 1 sets - 5 reps Standing Shoulder Extension with Dowel - 2 x daily - 6 x weekly - 1-2 sets - 10 reps Sidelying Shoulder Abduction Palm Forward - 2 x daily - 7 x weekly - 2 sets - 10 reps Sidelying Shoulder ER with Towel and Dumbbell - 2 x daily - 7 x weekly - 15 reps - 1 sets Standing Shoulder Internal Rotation with Anchored Resistance - 2 x daily - 7 x weekly - 2 sets - 10 reps

## 2020-03-20 NOTE — Therapy (Signed)
[place], [place], [address] Phone: [phone]   Fax:  [phone]  Physical Therapy Treatment  Patient Details  Name: Julie Mann MRN: [id number] Date of Birth: [birth year] Referring Provider (PT): Dr. Durward Fortes   Encounter Date: 03/20/2020  PT End of Session - 03/20/20 1139    Visit Number  9    Number of Visits  16    Date for PT Re-Evaluation  04/01/20    Authorization Type  AARP    Authorization Time Period  02/19/2020 - 04/29/2020    PT Start Time  1102    PT Stop Time  1141    PT Time Calculation (min)  39 min    Activity Tolerance  Patient tolerated treatment well    Behavior During Therapy  [facility] for tasks assessed/performed       Past Medical History:  Diagnosis Date  . 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 Arthritis    "hands, fingers, back" (02/27/2014)  . Atrial fibrillation ([place])   . Cat scratch of right hand 02/24/2014  . Dysrhythmia    a fib  . Fall 01/2018  . Fall from horse 01/2018  . Fracture, ankle 02/2019   Right   . High cholesterol   . History of stomach ulcers ~ 1966  . Osteopenia     Past Surgical History:  Procedure Laterality Date  . ANUS SURGERY  2005   "biopsy"  . BREAST CYST EXCISION  1966  . DILATION AND CURETTAGE OF UTERUS  1980's   S/P miscarriage  . EXTERNAL FIXATION LEG Right 03/10/2019   Procedure: OPEN REDUCTION INTERNAL FIXATION RIGHT ANKLE;  Surgeon: Marchia Bond, MD;  Location: [place];  Service: Orthopedics;  Laterality: Right;  Marland Kitchen VARICOSE VEIN SURGERY Left    left leg    There were no vitals filed for this visit.  Subjective Assessment - 03/20/20 1105    Subjective  shoulder is doing well, still having some soreness.  sleeping on right side some    Limitations  Lifting;House hold activities    Patient Stated Goals  regain function of shoulder    Currently in Pain?  Yes    Pain Score  2     Pain Location  Shoulder    Pain  Orientation  Left    Pain Descriptors / Indicators  Aching    Pain Type  Acute pain;Surgical pain    Pain Onset  1 to 4 weeks ago    Pain Frequency  Constant    Aggravating Factors   active movements    Pain Relieving Factors  pain meds                       [facility] Adult PT Treatment/Exercise - 03/20/20 1107      Shoulder Exercises: Sidelying   External Rotation  Left;10 reps;Weights    External Rotation Weight (lbs)  1    ABduction  Left;10 reps      Shoulder Exercises: Standing   Flexion  AAROM;Strengthening;Both;20 reps   3# bar   Row  Both;20 reps;Theraband   pink sports cord   Other Standing Exercises  Wall ladder X 10 reps flexion and 10 reps scaption.      Shoulder Exercises: Pulleys   Flexion  2 minutes    Scaption  2 minutes      Shoulder Exercises: ROM/Strengthening   UBE (Upper Arm Bike)  L1 x 4  min (2 min each direction)      Manual Therapy   Manual Therapy  Soft tissue mobilization    Soft tissue mobilization  IASTM to Lt deltoids/biceps/triceps             PT Education - 03/20/20 1139    Education Details  updated HEP    Person(s) Educated  Patient    Methods  Explanation;Demonstration;Handout    Comprehension  Verbalized understanding;Returned demonstration          PT Long Term Goals - 02/19/20 1459      PT LONG TERM GOAL #1   Title  Patient will demonstrate/report pain at worst less than or equal to 2/10 to facilitate minimal limitation in daily activity secondary to pain symptoms.    Time  8    Period  Weeks    Status  New    Target Date  04/15/20      PT LONG TERM GOAL #2   Title  Patient will demonstrate independent use of home exercise program to facilitate ability to maintain/progress functional gains from skilled physical therapy services.    Time  8    Period  Weeks    Status  New    Target Date  04/15/20      PT LONG TERM GOAL #3   Title  Patient will demonstrate Lt [place] joint mobility WFL to facilitate usual  self care, dressing, reaching overhead at PLOF s limitation due to symptoms.    Time  8    Period  Weeks    Status  New    Target Date  04/15/20      PT LONG TERM GOAL #4   Title  Pt. will demonstrate Lt UE MMT equal to Rt to facilitate usual lifting, carrying, recreational activity (violin) at PLOF.    Time  8    Period  Weeks    Status  New    Target Date  04/15/20      PT LONG TERM GOAL #5   Title  Pt. will demonstrate/report ability to play instrument at PLOF s limitation.    Time  8    Period  Weeks    Status  New    Target Date  04/15/20            Plan - 03/20/20 1139    Clinical Impression Statement  Progressing as expected post-op and able to begin light AROM/gentle strengthening exercises today.    Examination-Activity Limitations  Bathing;Carry;Lift;Reach Overhead    Armed forces operational officer Activity;Interpersonal Relationship;Yard Work;Meal Prep    Stability/Clinical Decision Making  Stable/Uncomplicated    Rehab Potential  Good    PT Frequency  2x / week    PT Duration  8 weeks    PT Treatment/Interventions  ADLs/Self Care Home Management;Electrical Stimulation;Ultrasound;Traction;Moist Heat;Iontophoresis 4mg /ml Dexamethasone;Gait training;Stair training;Functional mobility training;Therapeutic activities;Therapeutic exercise;Balance training;Neuromuscular re-education;Manual techniques;Patient/family education;Passive range of motion;Dry needling;Joint Manipulations;Spinal Manipulations;Vasopneumatic Device;Taping    PT Next Visit Plan  Can begin resisted bicep work on 03/19/20. Manual for pain/mobility, progress PROM, AROM activity, RTC strength. needs progress note    PT Home Exercise Plan  [email or web address]    Consulted and Agree with Plan of Care  Patient       Patient will benefit from skilled therapeutic intervention in order to improve the following deficits and impairments:  Decreased coordination, Decreased range of motion, Impaired UE  functional use, Decreased activity tolerance, Pain, Hypomobility, Decreased mobility, Decreased strength, Postural dysfunction  Visit Diagnosis: Chronic  left shoulder pain  Stiffness of left shoulder, not elsewhere classified  Muscle weakness (generalized)     Problem List Patient Active Problem List   Diagnosis Date Noted  . Biceps tendonosis of left shoulder 02/13/2020  . Impingement syndrome of left shoulder 02/13/2020  . Post-operative state 02/13/2020  . Ankle dislocation, right, initial encounter 03/10/2019  . Trimalleolar fracture of ankle, closed, right, initial encounter   . Chronic left shoulder pain 02/19/2019  . Anxiety 12/17/2016  . 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[place]) 12/17/2016  . Osteopenia 12/17/2016  . Paroxysmal atrial fibrillation ([place]) 12/17/2016  . Pure hypercholesterolemia 12/17/2016  . Rectal cancer ([place]) 07/30/2015      Laureen Abrahams, PT, DPT 03/20/20 11:42 AM    [facility] [address] [place], [place], [address] Phone: [phone]   Fax:  [phone]  Name: Julie Mann MRN: [id number] Date of Birth: [birth year]

## 2020-03-23 ENCOUNTER — Encounter: Payer: Medicare Other | Admitting: Rehabilitative and Restorative Service Providers"

## 2020-03-23 ENCOUNTER — Ambulatory Visit: Payer: Medicare Other | Admitting: Physical Therapy

## 2020-03-23 ENCOUNTER — Other Ambulatory Visit: Payer: Self-pay

## 2020-03-23 DIAGNOSIS — M25612 Stiffness of left shoulder, not elsewhere classified: Secondary | ICD-10-CM | POA: Diagnosis not present

## 2020-03-23 DIAGNOSIS — M6281 Muscle weakness (generalized): Secondary | ICD-10-CM

## 2020-03-23 DIAGNOSIS — M25512 Pain in left shoulder: Secondary | ICD-10-CM | POA: Diagnosis not present

## 2020-03-23 DIAGNOSIS — G8929 Other chronic pain: Secondary | ICD-10-CM | POA: Diagnosis not present

## 2020-03-23 NOTE — Therapy (Signed)
[facility] [address] [place], [place], [address] Phone: [phone]   Fax:  [phone]  Physical Therapy Treatment/Progress note Progress Note reporting period 02/19/20 to 03/23/20  See below for objective and subjective measurements relating to patients progress with PT.   Patient Details  Name: Julie Mann MRN: [id number] Date of Birth: [birth year] Referring Provider (PT): Dr. Durward Fortes   Encounter Date: 03/23/2020  PT End of Session - 03/23/20 1316    Visit Number  10    Number of Visits  16    Date for PT Re-Evaluation  04/01/20    Authorization Type  AARP    Authorization Time Period  02/19/2020 - 04/29/2020    Progress Note Due on Visit  20    PT Start Time  1300    PT Stop Time  1345    PT Time Calculation (min)  45 min    Activity Tolerance  Patient tolerated treatment well    Behavior During Therapy  [facility] for tasks assessed/performed       Past Medical History:  Diagnosis Date  . 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 Arthritis    "hands, fingers, back" (02/27/2014)  . Atrial fibrillation ([place])   . Cat scratch of right hand 02/24/2014  . Dysrhythmia    a fib  . Fall 01/2018  . Fall from horse 01/2018  . Fracture, ankle 02/2019   Right   . High cholesterol   . History of stomach ulcers ~ 1966  . Osteopenia     Past Surgical History:  Procedure Laterality Date  . ANUS SURGERY  2005   "biopsy"  . BREAST CYST EXCISION  1966  . DILATION AND CURETTAGE OF UTERUS  1980's   S/P miscarriage  . EXTERNAL FIXATION LEG Right 03/10/2019   Procedure: OPEN REDUCTION INTERNAL FIXATION RIGHT ANKLE;  Surgeon: Marchia Bond, MD;  Location: [place];  Service: Orthopedics;  Laterality: Right;  Marland Kitchen VARICOSE VEIN SURGERY Left    left leg    There were no vitals filed for this visit.  Subjective Assessment - 03/23/20 1354    Subjective  able to play violin up to 30 minutes at a time, still some pinching but  overall pain levels maintained.    Limitations  Lifting;House hold activities    Patient Stated Goals  regain function of shoulder    Pain Onset  1 to 4 weeks ago         [facility] PT Assessment - 03/23/20 0001      Assessment   Medical Diagnosis  Lt shoulder arthroscopy, SAD, DCR, open biceps tenodesis    Referring Provider (PT)  Dr. Durward Fortes      AROM   Left Shoulder Flexion  140 Degrees    Left Shoulder ABduction  120 Degrees      PROM   Overall PROM Comments  PROM [facility]      Strength   Left Shoulder Flexion  4/5    Left Shoulder ABduction  4/5    Left Shoulder Internal Rotation  4+/5    Left Shoulder External Rotation  4/5                   [facility] Adult PT Treatment/Exercise - 03/23/20 0001      Shoulder Exercises: Standing   External Rotation  Left;20 reps    Theraband Level (Shoulder External Rotation)  Level 3 (Green)    Internal Rotation  Left;20 reps  Theraband Level (Shoulder Internal Rotation)  Level 3 (Green)    Flexion  AAROM;Both    Flexion Limitations  2 lb bar X 15 reps    ABduction  Right;10 reps    Shoulder ABduction Weight (lbs)  2    Extension Limitations  shoulder extension and shoulder IR with 2 lb bar X 15 ea    Other Standing Exercises  D1 and D2 flexion Rt shouder with red band 2X10 reps ea, bicep flexion 3 lbs X15 reps    Other Standing Exercises  wall ladder X 10 flexion and scaption      Shoulder Exercises: Pulleys   Flexion  2 minutes    Scaption  2 minutes      Shoulder Exercises: ROM/Strengthening   UBE (Upper Arm Bike)  L2 x 5 min (2.5 min each direction)             PT Education - 03/23/20 1419    Education Details  updated HEP    Person(s) Educated  Patient    Methods  Explanation;Demonstration;Verbal cues;Handout    Comprehension  Verbalized understanding;Returned demonstration          PT Long Term Goals - 03/23/20 1353      PT LONG TERM GOAL #1   Title  Patient will demonstrate/report pain at worst  less than or equal to 2/10 to facilitate minimal limitation in daily activity secondary to pain symptoms.    Time  8    Period  Weeks    Status  On-going      PT LONG TERM GOAL #2   Title  Patient will demonstrate independent use of home exercise program to facilitate ability to maintain/progress functional gains from skilled physical therapy services.    Baseline  has been compliant but progressed HEP today    Time  8    Period  Weeks    Status  On-going      PT LONG TERM GOAL #3   Title  Patient will demonstrate Lt [place] joint mobility WFL to facilitate usual self care, dressing, reaching overhead at PLOF s limitation due to symptoms.    Baseline  PROM WFL, still lacking mild AROM    Time  8    Period  Weeks    Status  On-going      PT LONG TERM GOAL #4   Title  Pt. will demonstrate Lt UE MMT equal to Rt to facilitate usual lifting, carrying, recreational activity (violin) at PLOF.    Baseline  4/5    Time  8    Period  Weeks    Status  On-going      PT LONG TERM GOAL #5   Title  Pt. will demonstrate/report ability to play instrument at PLOF s limitation.    Baseline  up to 30 min per day now    Time  8    Period  Weeks    Status  On-going            Plan - 03/23/20 1419    Clinical Impression Statement  Progress note: she relays she feels 80% back to PLOF. She has made good progress with pain, strength, and ROM but still lacks signficant strength and endurance. She will continue to benefit from skilled PT to address her functional deficits.    Examination-Activity Limitations  Bathing;Carry;Lift;Reach Overhead    Armed forces operational officer Activity;Interpersonal Relationship;Yard Work;Meal Prep    Stability/Clinical Decision Making  Stable/Uncomplicated    Rehab Potential  Good    PT Frequency  2x / week    PT Duration  8 weeks    PT Treatment/Interventions  ADLs/Self Care Home Management;Electrical Stimulation;Ultrasound;Traction;Moist  Heat;Iontophoresis 4mg /ml Dexamethasone;Gait training;Stair training;Functional mobility training;Therapeutic activities;Therapeutic exercise;Balance training;Neuromuscular re-education;Manual techniques;Patient/family education;Passive range of motion;Dry needling;Joint Manipulations;Spinal Manipulations;Vasopneumatic Device;Taping    PT Next Visit Plan  Manual for pain/mobility, progress PROM, AROM activity, RTC strength, bicep strength and endurance    PT Home Exercise Plan  [id number]    Consulted and Agree with Plan of Care  Patient       Patient will benefit from skilled therapeutic intervention in order to improve the following deficits and impairments:  Decreased coordination, Decreased range of motion, Impaired UE functional use, Decreased activity tolerance, Pain, Hypomobility, Decreased mobility, Decreased strength, Postural dysfunction  Visit Diagnosis: Chronic left shoulder pain  Stiffness of left shoulder, not elsewhere classified  Muscle weakness (generalized)     Problem List Patient Active Problem List   Diagnosis Date Noted  . Biceps tendonosis of left shoulder 02/13/2020  . Impingement syndrome of left shoulder 02/13/2020  . Post-operative state 02/13/2020  . Ankle dislocation, right, initial encounter 03/10/2019  . Trimalleolar fracture of ankle, closed, right, initial encounter   . Chronic left shoulder pain 02/19/2019  . Anxiety 12/17/2016  . 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[place]) 12/17/2016  . Osteopenia 12/17/2016  . Paroxysmal atrial fibrillation ([place]) 12/17/2016  . Pure hypercholesterolemia 12/17/2016  . Rectal cancer ([place]) 07/30/2015    Silvestre Mesi 03/23/2020, 2:25 PM  [facility] Physical Therapy [address] [place], [place], [address] Phone: [phone]   Fax:  [phone]  Name: Julie Mann MRN: [id number] Date of Birth: 04-22-1946

## 2020-03-24 ENCOUNTER — Other Ambulatory Visit: Payer: Self-pay | Admitting: Obstetrics & Gynecology

## 2020-03-24 DIAGNOSIS — F419 Anxiety disorder, unspecified: Secondary | ICD-10-CM

## 2020-03-24 NOTE — Telephone Encounter (Signed)
Medication refill request: Ibuprofen and Lorazepam Last AEX:  07/25/19 SM Next AEX: 09/18/20 Last MMG (if hormonal medication request): 08/07/19 BIRADS 1 negative Refill authorized: Please advise on refills

## 2020-03-27 ENCOUNTER — Other Ambulatory Visit: Payer: Self-pay

## 2020-03-27 ENCOUNTER — Encounter: Payer: Medicare Other | Admitting: Rehabilitative and Restorative Service Providers"

## 2020-03-27 ENCOUNTER — Ambulatory Visit: Payer: Medicare Other | Admitting: Physical Therapy

## 2020-03-27 DIAGNOSIS — M25512 Pain in left shoulder: Secondary | ICD-10-CM | POA: Diagnosis not present

## 2020-03-27 DIAGNOSIS — M25612 Stiffness of left shoulder, not elsewhere classified: Secondary | ICD-10-CM | POA: Diagnosis not present

## 2020-03-27 DIAGNOSIS — M6281 Muscle weakness (generalized): Secondary | ICD-10-CM

## 2020-03-27 DIAGNOSIS — G8929 Other chronic pain: Secondary | ICD-10-CM | POA: Diagnosis not present

## 2020-03-27 NOTE — Therapy (Signed)
[place], [place], [address] Phone: [phone]   Fax:  [phone]  Physical Therapy Treatment  Patient Details  Name: Julie Mann MRN: [id number] Date of Birth: September 13, 1946 Referring Provider (PT): Dr. Durward Fortes   Encounter Date: 03/27/2020  PT End of Session - 03/27/20 1032    Visit Number  11    Number of Visits  16    Date for PT Re-Evaluation  04/01/20    Authorization Type  AARP    Authorization Time Period  02/19/2020 - 04/29/2020    Progress Note Due on Visit  20    PT Start Time  0930    PT Stop Time  1010    PT Time Calculation (min)  40 min    Activity Tolerance  Patient tolerated treatment well    Behavior During Therapy  [facility] for tasks assessed/performed       Past Medical History:  Diagnosis Date  . 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 Arthritis    "hands, fingers, back" (02/27/2014)  . Atrial fibrillation ([place])   . Cat scratch of right hand 02/24/2014  . Dysrhythmia    a fib  . Fall 01/2018  . Fall from horse 01/2018  . Fracture, ankle 02/2019   Right   . High cholesterol   . History of stomach ulcers ~ 1966  . Osteopenia     Past Surgical History:  Procedure Laterality Date  . ANUS SURGERY  2005   "biopsy"  . BREAST CYST EXCISION  1966  . DILATION AND CURETTAGE OF UTERUS  1980's   S/P miscarriage  . EXTERNAL FIXATION LEG Right 03/10/2019   Procedure: OPEN REDUCTION INTERNAL FIXATION RIGHT ANKLE;  Surgeon: Marchia Bond, MD;  Location: [place];  Service: Orthopedics;  Laterality: Right;  Marland Kitchen VARICOSE VEIN SURGERY Left    left leg    There were no vitals filed for this visit.  Subjective Assessment - 03/27/20 1025    Subjective  able to play violin now up to 45 minutes at a time, still some pinching but overall pain levels maintained. Still with some difficulty maintaining her arm over head like when she is working with her hair.    Limitations  Lifting;House  hold activities    Patient Stated Goals  regain function of shoulder    Pain Onset  1 to 4 weeks ago                        [facility] Adult PT Treatment/Exercise - 03/27/20 0001      Shoulder Exercises: Standing   External Rotation  Left;20 reps    Theraband Level (Shoulder External Rotation)  Level 3 (Green)    Internal Rotation  Left;20 reps    Theraband Level (Shoulder Internal Rotation)  Level 3 (Green)    Flexion  AAROM;Both    Flexion Limitations  3 lb bar X 10 reps    ABduction  Right;10 reps    Shoulder ABduction Weight (lbs)  --    ABduction Limitations  performed AAROM with 3 lb bar X 10 reps, then after rest performed standing abduction strength Lt arm 1 lb X 10 reps    Extension Limitations  shoulder extension and shoulder IR with 3 lb bar X 15 ea    Other Standing Exercises  shoulder flexion reaching up into cabinent 2 lbs X 10, bicep flexion 3 lb bar X20 reps  Other Standing Exercises  wall ladder X 10 flexion and scaption      Shoulder Exercises: Pulleys   Flexion  2 minutes    Scaption  2 minutes      Shoulder Exercises: ROM/Strengthening   UBE (Upper Arm Bike)  L2 x 5 min (2.5 min each direction)    [place]  X15 reps flexion, circles, then 10 reps for abduction                  PT Long Term Goals - 03/23/20 1353      PT LONG TERM GOAL #1   Title  Patient will demonstrate/report pain at worst less than or equal to 2/10 to facilitate minimal limitation in daily activity secondary to pain symptoms.    Time  8    Period  Weeks    Status  On-going      PT LONG TERM GOAL #2   Title  Patient will demonstrate independent use of home exercise program to facilitate ability to maintain/progress functional gains from skilled physical therapy services.    Baseline  has been compliant but progressed HEP today    Time  8    Period  Weeks    Status  On-going      PT LONG TERM GOAL #3   Title  Patient will demonstrate Lt [place] joint mobility WFL to  facilitate usual self care, dressing, reaching overhead at PLOF s limitation due to symptoms.    Baseline  PROM WFL, still lacking mild AROM    Time  8    Period  Weeks    Status  On-going      PT LONG TERM GOAL #4   Title  Pt. will demonstrate Lt UE MMT equal to Rt to facilitate usual lifting, carrying, recreational activity (violin) at PLOF.    Baseline  4/5    Time  8    Period  Weeks    Status  On-going      PT LONG TERM GOAL #5   Title  Pt. will demonstrate/report ability to play instrument at PLOF s limitation.    Baseline  up to 30 min per day now    Time  8    Period  Weeks    Status  On-going            Plan - 03/27/20 1032    Clinical Impression Statement  continued to work on strength, endurance, ROM for her Lt shoulder. She has been pleased with her progress with PT thus far but continues to lack strength and OH endurance and PT will proress this as able.    Examination-Activity Limitations  Bathing;Carry;Lift;Reach Overhead    Armed forces operational officer Activity;Interpersonal Relationship;Yard Work;Meal Prep    Stability/Clinical Decision Making  Stable/Uncomplicated    Rehab Potential  Good    PT Frequency  2x / week    PT Duration  8 weeks    PT Treatment/Interventions  ADLs/Self Care Home Management;Electrical Stimulation;Ultrasound;Traction;Moist Heat;Iontophoresis 4mg /ml Dexamethasone;Gait training;Stair training;Functional mobility training;Therapeutic activities;Therapeutic exercise;Balance training;Neuromuscular re-education;Manual techniques;Patient/family education;Passive range of motion;Dry needling;Joint Manipulations;Spinal Manipulations;Vasopneumatic Device;Taping    PT Next Visit Plan  Manual for pain/mobility, progress PROM, AROM activity, RTC strength, bicep strength and endurance    PT Home Exercise Plan  [id number]    Consulted and Agree with Plan of Care  Patient       Patient will benefit from skilled therapeutic  intervention in order to improve the following deficits and impairments:  Decreased  coordination, Decreased range of motion, Impaired UE functional use, Decreased activity tolerance, Pain, Hypomobility, Decreased mobility, Decreased strength, Postural dysfunction  Visit Diagnosis: Chronic left shoulder pain  Stiffness of left shoulder, not elsewhere classified  Muscle weakness (generalized)     Problem List Patient Active Problem List   Diagnosis Date Noted  . Biceps tendonosis of left shoulder 02/13/2020  . Impingement syndrome of left shoulder 02/13/2020  . Post-operative state 02/13/2020  . Ankle dislocation, right, initial encounter 03/10/2019  . Trimalleolar fracture of ankle, closed, right, initial encounter   . Chronic left shoulder pain 02/19/2019  . Anxiety 12/17/2016  . 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[place]) 12/17/2016  . Osteopenia 12/17/2016  . Paroxysmal atrial fibrillation ([place]) 12/17/2016  . Pure hypercholesterolemia 12/17/2016  . Rectal cancer [facility]) 07/30/2015    Silvestre Mesi 03/27/2020, 10:39 AM  [facility] Physical Therapy [address] [place], [place], [address] Phone: [phone]   Fax:  [phone]  Name: Julie Mann MRN: [id number] Date of Birth: May 28, 1946

## 2020-03-30 ENCOUNTER — Other Ambulatory Visit: Payer: Self-pay

## 2020-03-30 ENCOUNTER — Encounter: Payer: Self-pay | Admitting: Physical Therapy

## 2020-03-30 ENCOUNTER — Ambulatory Visit: Payer: Medicare Other | Admitting: Physical Therapy

## 2020-03-30 DIAGNOSIS — M25512 Pain in left shoulder: Secondary | ICD-10-CM | POA: Diagnosis not present

## 2020-03-30 DIAGNOSIS — M6281 Muscle weakness (generalized): Secondary | ICD-10-CM | POA: Diagnosis not present

## 2020-03-30 DIAGNOSIS — M25612 Stiffness of left shoulder, not elsewhere classified: Secondary | ICD-10-CM | POA: Diagnosis not present

## 2020-03-30 DIAGNOSIS — G8929 Other chronic pain: Secondary | ICD-10-CM | POA: Diagnosis not present

## 2020-03-30 NOTE — Therapy (Signed)
[place], [place], [address] Phone: [phone]   Fax:  [phone]  Physical Therapy Treatment  Patient Details  Name: Julie Mann MRN: [id number] Date of Birth: 01-08-1946 Referring Provider (PT): Dr. Durward Fortes   Encounter Date: 03/30/2020  PT End of Session - 03/30/20 1427    Visit Number  12    Number of Visits  16    Date for PT Re-Evaluation  04/01/20    Authorization Type  AARP    Authorization Time Period  02/19/2020 - 04/29/2020    Progress Note Due on Visit  20    PT Start Time  1346    PT Stop Time  1425    PT Time Calculation (min)  39 min    Activity Tolerance  Patient tolerated treatment well    Behavior During Therapy  [facility] for tasks assessed/performed       Past Medical History:  Diagnosis Date  . 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 Arthritis    "hands, fingers, back" (02/27/2014)  . Atrial fibrillation ([place])   . Cat scratch of right hand 02/24/2014  . Dysrhythmia    a fib  . Fall 01/2018  . Fall from horse 01/2018  . Fracture, ankle 02/2019   Right   . High cholesterol   . History of stomach ulcers ~ 1966  . Osteopenia     Past Surgical History:  Procedure Laterality Date  . ANUS SURGERY  2005   "biopsy"  . BREAST CYST EXCISION  1966  . DILATION AND CURETTAGE OF UTERUS  1980's   S/P miscarriage  . EXTERNAL FIXATION LEG Right 03/10/2019   Procedure: OPEN REDUCTION INTERNAL FIXATION RIGHT ANKLE;  Surgeon: Marchia Bond, MD;  Location: [place];  Service: Orthopedics;  Laterality: Right;  Marland Kitchen VARICOSE VEIN SURGERY Left    left leg    There were no vitals filed for this visit.  Subjective Assessment - 03/30/20 1351    Subjective  doing well; played yesterday and had no issues.  shoulder is a little sore today because of doing some gardening.    Limitations  Lifting;House hold activities    Patient Stated Goals  regain function of shoulder    Currently in  Pain?  No/denies    Pain Onset  1 to 4 weeks ago                        [facility] Adult PT Treatment/Exercise - 03/30/20 1348      Shoulder Exercises: Standing   External Rotation  Left;20 reps    Theraband Level (Shoulder External Rotation)  Level 3 (Green)    Internal Rotation  Left;20 reps    Theraband Level (Shoulder Internal Rotation)  Level 3 (Green)    Flexion  Left;20 reps;Weights    Shoulder Flexion Weight (lbs)  1    ABduction  Right;20 reps;Weights    Shoulder ABduction Weight (lbs)  0.5    Extension  Both;20 reps;Theraband    Theraband Level (Shoulder Extension)  Level 3 (Green)    Row  [employer];Theraband    Theraband Level (Shoulder Row)  Level 3 (Green)      Shoulder Exercises: Pulleys   Flexion  2 minutes    Scaption  2 minutes      Shoulder Exercises: ROM/Strengthening   [place]  X15 reps flexion, circles, then 10 reps for abduction    Plank Limitations  weight shifting at counter x 30 sec    Proximal Shoulder Strengthening, Supine  Lt 1# weight; circles x 15 reps each way, A-Z      Shoulder Exercises: Stretch   Internal Rotation Stretch  5 reps   15 sec                 PT Long Term Goals - 03/23/20 1353      PT LONG TERM GOAL #1   Title  Patient will demonstrate/report pain at worst less than or equal to 2/10 to facilitate minimal limitation in daily activity secondary to pain symptoms.    Time  8    Period  Weeks    Status  On-going      PT LONG TERM GOAL #2   Title  Patient will demonstrate independent use of home exercise program to facilitate ability to maintain/progress functional gains from skilled physical therapy services.    Baseline  has been compliant but progressed HEP today    Time  8    Period  Weeks    Status  On-going      PT LONG TERM GOAL #3   Title  Patient will demonstrate Lt [place] joint mobility WFL to facilitate usual self care, dressing, reaching overhead at PLOF s limitation due to symptoms.     Baseline  PROM WFL, still lacking mild AROM    Time  8    Period  Weeks    Status  On-going      PT LONG TERM GOAL #4   Title  Pt. will demonstrate Lt UE MMT equal to Rt to facilitate usual lifting, carrying, recreational activity (violin) at PLOF.    Baseline  4/5    Time  8    Period  Weeks    Status  On-going      PT LONG TERM GOAL #5   Title  Pt. will demonstrate/report ability to play instrument at PLOF s limitation.    Baseline  up to 30 min per day now    Time  8    Period  Weeks    Status  On-going            Plan - 03/30/20 1427    Clinical Impression Statement  Progressing well with PT, still working on strength and ROM for Lt shoulder. Will continue to benefit from PT to maximize function.    Examination-Activity Limitations  Bathing;Carry;Lift;Reach Overhead    Armed forces operational officer Activity;Interpersonal Relationship;Yard Work;Meal Prep    Stability/Clinical Decision Making  Stable/Uncomplicated    Rehab Potential  Good    PT Frequency  2x / week    PT Duration  8 weeks    PT Treatment/Interventions  ADLs/Self Care Home Management;Electrical Stimulation;Ultrasound;Traction;Moist Heat;Iontophoresis 4mg /ml Dexamethasone;Gait training;Stair training;Functional mobility training;Therapeutic activities;Therapeutic exercise;Balance training;Neuromuscular re-education;Manual techniques;Patient/family education;Passive range of motion;Dry needling;Joint Manipulations;Spinal Manipulations;Vasopneumatic Device;Taping    PT Next Visit Plan  Manual for pain/mobility, progress PROM, AROM activity, RTC strength, bicep strength and endurance; needs recert    PT Home Exercise Plan  [id number]    Consulted and Agree with Plan of Care  Patient       Patient will benefit from skilled therapeutic intervention in order to improve the following deficits and impairments:  Decreased coordination, Decreased range of motion, Impaired UE functional use, Decreased  activity tolerance, Pain, Hypomobility, Decreased mobility, Decreased strength, Postural dysfunction  Visit Diagnosis: Chronic left shoulder pain  Stiffness of left shoulder, not elsewhere classified  Muscle weakness (  generalized)     Problem List Patient Active Problem List   Diagnosis Date Noted  . Biceps tendonosis of left shoulder 02/13/2020  . Impingement syndrome of left shoulder 02/13/2020  . Post-operative state 02/13/2020  . Ankle dislocation, right, initial encounter 03/10/2019  . Trimalleolar fracture of ankle, closed, right, initial encounter   . Chronic left shoulder pain 02/19/2019  . Anxiety 12/17/2016  . 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[place]) 12/17/2016  . Osteopenia 12/17/2016  . Paroxysmal atrial fibrillation ([place]) 12/17/2016  . Pure hypercholesterolemia 12/17/2016  . Rectal cancer ([place]) 07/30/2015      Laureen Abrahams, PT, DPT 03/30/20 2:28 PM    [facility] [address] [place], [place], [address] Phone: [phone]   Fax:  [phone]  Name: Julie Mann MRN: [id number] Date of Birth: Aug 22, 1946

## 2020-04-03 ENCOUNTER — Encounter: Payer: Medicare Other | Admitting: Rehabilitative and Restorative Service Providers"

## 2020-04-03 ENCOUNTER — Other Ambulatory Visit: Payer: Self-pay

## 2020-04-03 ENCOUNTER — Ambulatory Visit: Payer: Medicare Other | Admitting: Physical Therapy

## 2020-04-03 ENCOUNTER — Encounter: Payer: Self-pay | Admitting: Physical Therapy

## 2020-04-03 DIAGNOSIS — G8929 Other chronic pain: Secondary | ICD-10-CM | POA: Diagnosis not present

## 2020-04-03 DIAGNOSIS — M25612 Stiffness of left shoulder, not elsewhere classified: Secondary | ICD-10-CM

## 2020-04-03 DIAGNOSIS — M25512 Pain in left shoulder: Secondary | ICD-10-CM

## 2020-04-03 DIAGNOSIS — M6281 Muscle weakness (generalized): Secondary | ICD-10-CM | POA: Diagnosis not present

## 2020-04-03 NOTE — Therapy (Signed)
[facility] [place], [place], [address] Phone: [phone]   Fax:  [phone]  Physical Therapy Treatment/Recertification  Patient Details  Name: Julie Mann MRN: [id number] Date of Birth: 21-May-1946 Referring Provider (PT): Dr. Durward Fortes   Encounter Date: 04/03/2020  PT End of Session - 04/03/20 1104    Visit Number  13    Number of Visits  20    Date for PT Re-Evaluation  05/15/20    Authorization Type  AARP    Authorization Time Period  02/19/2020 - 04/29/2020    Progress Note Due on Visit  20    PT Start Time  1016    PT Stop Time  1054    PT Time Calculation (min)  38 min    Activity Tolerance  Patient tolerated treatment well    Behavior During Therapy  [facility] for tasks assessed/performed       Past Medical History:  Diagnosis Date  . 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 Arthritis    "hands, fingers, back" (02/27/2014)  . Atrial fibrillation ([place])   . Cat scratch of right hand 02/24/2014  . Dysrhythmia    a fib  . Fall 01/2018  . Fall from horse 01/2018  . Fracture, ankle 02/2019   Right   . High cholesterol   . History of stomach ulcers ~ 1966  . Osteopenia     Past Surgical History:  Procedure Laterality Date  . ANUS SURGERY  2005   "biopsy"  . BREAST CYST EXCISION  1966  . DILATION AND CURETTAGE OF UTERUS  1980's   S/P miscarriage  . EXTERNAL FIXATION LEG Right 03/10/2019   Procedure: OPEN REDUCTION INTERNAL FIXATION RIGHT ANKLE;  Surgeon: Marchia Bond, MD;  Location: [facility];  Service: Orthopedics;  Laterality: Right;  Marland Kitchen VARICOSE VEIN SURGERY Left    left leg    There were no vitals filed for this visit.  Subjective Assessment - 04/03/20 1023    Subjective  Lt shoulder middle deltoid is a little sore from pruning bushes    Limitations  Lifting;House hold activities    Patient Stated Goals  regain function of shoulder    Currently in Pain?  No/denies         [facility] PT  Assessment - 04/03/20 1050      Assessment   Medical Diagnosis  Lt shoulder arthroscopy, SAD, DCR, open biceps tenodesis    Referring Provider (PT)  Dr. Durward Fortes      Strength   Right Shoulder Flexion  5/5    Right Shoulder ABduction  5/5    Right Shoulder Internal Rotation  5/5    Right Shoulder External Rotation  5/5    Left Shoulder Flexion  4/5    Left Shoulder ABduction  4/5    Left Shoulder Internal Rotation  5/5    Left Shoulder External Rotation  5/5                    [facility] Adult PT Treatment/Exercise - 04/03/20 1018      Shoulder Exercises: Prone   Retraction  Left;20 reps;Weights    Retraction Weight (lbs)  2    Extension  Left;20 reps;Weights    Extension Weight (lbs)  2      Shoulder Exercises: Standing   External Rotation  Left;20 reps    Theraband Level (Shoulder External Rotation)  Level 3 (Green)    Internal Rotation  Left;20 reps  Theraband Level (Shoulder Internal Rotation)  Level 3 (Green)    Row  Both;Theraband   3x10   Theraband Level (Shoulder Row)  Level 3 (Green)    Other Standing Exercises  wall ladder X 10 flexion and scaption      Shoulder Exercises: ROM/Strengthening   UBE (Upper Arm Bike)  L2 x 6 min (3 min each direction)    [place]  2# wrist weight flexion x 15 reps                  PT Long Term Goals - 04/03/20 1105      PT LONG TERM GOAL #1   Title  Patient will demonstrate/report pain at worst less than or equal to 2/10 to facilitate minimal limitation in daily activity secondary to pain symptoms.    Baseline  5/21: still up to 3/10 - improved, not to goal    Time  6    Period  Weeks    Status  On-going    Target Date  05/15/20      PT LONG TERM GOAL #2   Title  Patient will demonstrate independent use of home exercise program to facilitate ability to maintain/progress functional gains from skilled physical therapy services.    Baseline  has been compliant but progressed HEP today    Time  6    Period   Weeks    Status  On-going    Target Date  05/15/20      PT LONG TERM GOAL #3   Title  Patient will demonstrate Lt [place] joint mobility WFL to facilitate usual self care, dressing, reaching overhead at PLOF s limitation due to symptoms.    Baseline  PROM WFL, still lacking mild AROM    Time  8    Period  Weeks    Status  Achieved      PT LONG TERM GOAL #4   Title  Pt. will demonstrate Lt UE MMT equal to Rt to facilitate usual lifting, carrying, recreational activity (violin) at PLOF.    Baseline  5/21: flexion and abduction 4/5 on Lt    Time  6    Period  Weeks    Status  On-going    Target Date  05/15/20      PT LONG TERM GOAL #5   Title  Pt. will demonstrate/report ability to play instrument at PLOF s limitation.    Baseline  up to 30 min per day now    Time  8    Period  Weeks    Status  On-going            Plan - 04/03/20 1106    Clinical Impression Statement  Recert completed today with 1 LTG met, and progress made towards all others just not quite to goal.  Plan to continue to work on strengthening and decreasing pain overall for up to 2x/wk, with anticipation to decrease to 1x/wk or biweekly over next 4 weeks.  Will continue to benefit from PT to address deficits.    Examination-Activity Limitations  Bathing;Carry;Lift;Reach Overhead    Armed forces operational officer Activity;Interpersonal Relationship;Yard Work;Meal Prep    Stability/Clinical Decision Making  Stable/Uncomplicated    Rehab Potential  Good    PT Frequency  2x / week   2x/wk decreasing to 1x/wk or biweekly   PT Duration  6 weeks    PT Treatment/Interventions  ADLs/Self Care Home Management;Electrical Stimulation;Ultrasound;Traction;Moist Heat;Iontophoresis '4mg'$ /ml Dexamethasone;Gait training;Stair training;Functional mobility training;Therapeutic activities;Therapeutic exercise;Balance training;Neuromuscular re-education;Manual  techniques;Patient/family education;Passive range of  motion;Dry needling;Joint Manipulations;Spinal Manipulations;Vasopneumatic Device;Taping    PT Next Visit Plan  Manual for pain/mobility, progress PROM, AROM activity, RTC strength, bicep strength and endurance    PT Home Exercise Plan  [id number]    Consulted and Agree with Plan of Care  Patient       Patient will benefit from skilled therapeutic intervention in order to improve the following deficits and impairments:  Decreased coordination, Decreased range of motion, Impaired UE functional use, Decreased activity tolerance, Pain, Hypomobility, Decreased mobility, Decreased strength, Postural dysfunction  Visit Diagnosis: Chronic left shoulder pain - Plan: PT plan of care cert/re-cert  Stiffness of left shoulder, not elsewhere classified - Plan: PT plan of care cert/re-cert  Muscle weakness (generalized) - Plan: PT plan of care cert/re-cert     Problem List Patient Active Problem List   Diagnosis Date Noted  . Biceps tendonosis of left shoulder 02/13/2020  . Impingement syndrome of left shoulder 02/13/2020  . Post-operative state 02/13/2020  . Ankle dislocation, right, initial encounter 03/10/2019  . Trimalleolar fracture of ankle, closed, right, initial encounter   . Chronic left shoulder pain 02/19/2019  . Anxiety 12/17/2016  . 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[place]) 12/17/2016  . Osteopenia 12/17/2016  . Paroxysmal atrial fibrillation ([place]) 12/17/2016  . Pure hypercholesterolemia 12/17/2016  . Rectal cancer ([place]) 07/30/2015      Laureen Abrahams, PT, DPT 04/03/20 11:09 AM     [facility] [address] [place], [place], [address] Phone: [phone]   Fax:  [phone]  Name: Julie Mann MRN: [id number] Date of Birth: June 06, 1946

## 2020-04-06 ENCOUNTER — Encounter: Payer: Medicare Other | Admitting: Physical Therapy

## 2020-04-06 ENCOUNTER — Encounter: Payer: Self-pay | Admitting: Physical Therapy

## 2020-04-06 ENCOUNTER — Ambulatory Visit: Payer: Medicare Other | Admitting: Physical Therapy

## 2020-04-06 ENCOUNTER — Other Ambulatory Visit: Payer: Self-pay

## 2020-04-06 DIAGNOSIS — M25612 Stiffness of left shoulder, not elsewhere classified: Secondary | ICD-10-CM

## 2020-04-06 DIAGNOSIS — G8929 Other chronic pain: Secondary | ICD-10-CM | POA: Diagnosis not present

## 2020-04-06 DIAGNOSIS — M25512 Pain in left shoulder: Secondary | ICD-10-CM

## 2020-04-06 DIAGNOSIS — M6281 Muscle weakness (generalized): Secondary | ICD-10-CM | POA: Diagnosis not present

## 2020-04-06 NOTE — Therapy (Signed)
[place], [place], [address] Phone: [phone]   Fax:  [phone]  Physical Therapy Treatment  Patient Details  Name: Julie Mann MRN: [id number] Date of Birth: [birth year] Referring Provider (PT): Dr. Durward Fortes   Encounter Date: 04/06/2020  PT End of Session - 04/06/20 1357    Visit Number  14    Number of Visits  20    Date for PT Re-Evaluation  05/15/20    Authorization Type  AARP    Authorization Time Period  02/19/2020 - 04/29/2020    Progress Note Due on Visit  20    PT Start Time  1315   pt arrived late   PT Stop Time  1347    PT Time Calculation (min)  32 min    Activity Tolerance  Patient tolerated treatment well    Behavior During Therapy  [facility] for tasks assessed/performed       Past Medical History:  Diagnosis Date  . 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 Arthritis    "hands, fingers, back" (02/27/2014)  . Atrial fibrillation ([place])   . Cat scratch of right hand 02/24/2014  . Dysrhythmia    a fib  . Fall 01/2018  . Fall from horse 01/2018  . Fracture, ankle 02/2019   Right   . High cholesterol   . History of stomach ulcers ~ 1966  . Osteopenia     Past Surgical History:  Procedure Laterality Date  . ANUS SURGERY  2005   "biopsy"  . BREAST CYST EXCISION  1966  . DILATION AND CURETTAGE OF UTERUS  1980's   S/P miscarriage  . EXTERNAL FIXATION LEG Right 03/10/2019   Procedure: OPEN REDUCTION INTERNAL FIXATION RIGHT ANKLE;  Surgeon: Marchia Bond, MD;  Location: [facility];  Service: Orthopedics;  Laterality: Right;  Marland Kitchen VARICOSE VEIN SURGERY Left    left leg    There were no vitals filed for this visit.  Subjective Assessment - 04/06/20 1316    Subjective  doing well.    Limitations  Lifting;House hold activities    Patient Stated Goals  regain function of shoulder    Currently in Pain?  No/denies         [facility] PT Assessment - 04/06/20 1322      Assessment   Medical Diagnosis  Lt shoulder arthroscopy, SAD, DCR, open biceps tenodesis    Referring Provider (PT)  Dr. Durward Fortes    Next MD Visit  04/09/20      AROM   Overall AROM Comments  Measured in supine    Left Shoulder Flexion  148 Degrees    Left Shoulder ABduction  123 Degrees    Left Shoulder Internal Rotation  55 Degrees    Left Shoulder External Rotation  80 Degrees      Strength   Left Shoulder Flexion  4/5    Left Shoulder ABduction  4/5    Left Shoulder Internal Rotation  5/5    Left Shoulder External Rotation  5/5                    [facility] Adult PT Treatment/Exercise - 04/06/20 1317      Shoulder Exercises: ROM/Strengthening   UBE (Upper Arm Bike)  L5 x 6 min (3 min each directions)      Manual Therapy   Manual therapy comments  Lt shoulder PROM all planes to tolerance    Soft tissue mobilization  Lt  upper trap with trigger point release                  PT Long Term Goals - 04/03/20 1105      PT LONG TERM GOAL #1   Title  Patient will demonstrate/report pain at worst less than or equal to 2/10 to facilitate minimal limitation in daily activity secondary to pain symptoms.    Baseline  5/21: still up to 3/10 - improved, not to goal    Time  6    Period  Weeks    Status  On-going    Target Date  05/15/20      PT LONG TERM GOAL #2   Title  Patient will demonstrate independent use of home exercise program to facilitate ability to maintain/progress functional gains from skilled physical therapy services.    Baseline  has been compliant but progressed HEP today    Time  6    Period  Weeks    Status  On-going    Target Date  05/15/20      PT LONG TERM GOAL #3   Title  Patient will demonstrate Lt [place] joint mobility WFL to facilitate usual self care, dressing, reaching overhead at PLOF s limitation due to symptoms.    Baseline  PROM WFL, still lacking mild AROM    Time  8    Period  Weeks    Status  Achieved      PT LONG TERM GOAL #4   Title   Pt. will demonstrate Lt UE MMT equal to Rt to facilitate usual lifting, carrying, recreational activity (violin) at PLOF.    Baseline  5/21: flexion and abduction 4/5 on Lt    Time  6    Period  Weeks    Status  On-going    Target Date  05/15/20      PT LONG TERM GOAL #5   Title  Pt. will demonstrate/report ability to play instrument at PLOF s limitation.    Baseline  up to 30 min per day now    Time  8    Period  Weeks    Status  On-going            Plan - 04/06/20 1357    Clinical Impression Statement  Pt tolerated session well today focusing mainly on end range of Lt shoulder motion.  Progressing well with PT at this time, and to MD later this week.  Plan to decrease to biweekly after this week with plans to see more if needed.  Will continue to benefit from PT to maximize function.    Examination-Activity Limitations  Bathing;Carry;Lift;Reach Overhead    Armed forces operational officer Activity;Interpersonal Relationship;Yard Work;Meal Prep    Stability/Clinical Decision Making  Stable/Uncomplicated    Rehab Potential  Good    PT Frequency  2x / week   2x/wk decreasing to 1x/wk or biweekly   PT Duration  6 weeks    PT Treatment/Interventions  ADLs/Self Care Home Management;Electrical Stimulation;Ultrasound;Traction;Moist Heat;Iontophoresis 4mg /ml Dexamethasone;Gait training;Stair training;Functional mobility training;Therapeutic activities;Therapeutic exercise;Balance training;Neuromuscular re-education;Manual techniques;Patient/family education;Passive range of motion;Dry needling;Joint Manipulations;Spinal Manipulations;Vasopneumatic Device;Taping    PT Next Visit Plan  Manual for pain/mobility, progress PROM, AROM activity, RTC strength, bicep strength and endurance    PT Home Exercise Plan  [id number]    Consulted and Agree with Plan of Care  Patient       Patient will benefit from skilled therapeutic intervention in order to improve the following deficits  and  impairments:  Decreased coordination, Decreased range of motion, Impaired UE functional use, Decreased activity tolerance, Pain, Hypomobility, Decreased mobility, Decreased strength, Postural dysfunction  Visit Diagnosis: Chronic left shoulder pain  Stiffness of left shoulder, not elsewhere classified  Muscle weakness (generalized)     Problem List Patient Active Problem List   Diagnosis Date Noted  . Biceps tendonosis of left shoulder 02/13/2020  . Impingement syndrome of left shoulder 02/13/2020  . Post-operative state 02/13/2020  . Ankle dislocation, right, initial encounter 03/10/2019  . Trimalleolar fracture of ankle, closed, right, initial encounter   . Chronic left shoulder pain 02/19/2019  . Anxiety 12/17/2016  . 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[place]) 12/17/2016  . Osteopenia 12/17/2016  . Paroxysmal atrial fibrillation ([place]) 12/17/2016  . Pure hypercholesterolemia 12/17/2016  . Rectal cancer ([place]) 07/30/2015       Laureen Abrahams, PT, DPT 04/06/20 2:00 PM     [facility] [address] [place], [place], [address] Phone: [phone]   Fax:  [phone]  Name: AKYA RITTINGER MRN: [id number] Date of Birth: 02-28-1946

## 2020-04-09 ENCOUNTER — Encounter: Payer: Self-pay | Admitting: Orthopaedic Surgery

## 2020-04-09 ENCOUNTER — Other Ambulatory Visit: Payer: Self-pay

## 2020-04-09 ENCOUNTER — Ambulatory Visit (INDEPENDENT_AMBULATORY_CARE_PROVIDER_SITE_OTHER): Payer: Medicare Other | Admitting: Orthopaedic Surgery

## 2020-04-09 DIAGNOSIS — M25512 Pain in left shoulder: Secondary | ICD-10-CM

## 2020-04-09 DIAGNOSIS — M67814 Other specified disorders of tendon, left shoulder: Secondary | ICD-10-CM

## 2020-04-09 DIAGNOSIS — G8929 Other chronic pain: Secondary | ICD-10-CM

## 2020-04-09 NOTE — Progress Notes (Signed)
Office Visit Note   Patient: Julie Mann           Date of Birth: 1946/02/20           MRN: [id number] Visit Date: 04/09/2020              Requested by: Julie Manes, MD [address] [place],  [place] [address] PCP: Julie Manes, MD   Assessment & Plan: Visit Diagnoses:  1. Biceps tendonosis of left shoulder   2. Chronic left shoulder pain     Plan: Julie Mann is 2 months status post arthroscopic SCD DCR and mini open biceps tenodesis left shoulder.  Doing very well.  She is back playing the violin and having minimal discomfort.  She finishes her course of physical therapy tomorrow.  Still has a slight loss of overhead flexion consistent with mild adhesive capsulitis but she has a home exercise program and is motivated.  She is done very well she is not having the pain she had preoperatively.  I will plan to see her back on a as needed basis  Follow-Up Instructions: Return if symptoms worsen or fail to improve.   Orders:  No orders of the defined types were placed in this encounter.  No orders of the defined types were placed in this encounter.     Procedures: No procedures performed   Clinical Data: No additional findings.   Subjective: No chief complaint on file. 2 months status post left shoulder surgery with SCD, DCR and biceps tenodesis.  Doing quite well.  Playing violin.  Finish his course of physical therapy tomorrow and has a home exercise program.  Very happy with the results  HPI  Review of Systems   Objective: Vital Signs: LMP 11/14/1992   Physical Exam  Ortho Exam left shoulder lacked a few degrees of full overhead motion of about 20 degrees.  She could scratch the middle of her back.  Negative impingement and empty can testing.  Good strength good grip and release and no areas of tenderness about the shoulder.  Biceps position looks fine  Specialty Comments:  No specialty comments available.  Imaging: No results found.   PMFS  History: Patient Active Problem List   Diagnosis Date Noted  . Biceps tendonosis of left shoulder 02/13/2020  . Impingement syndrome of left shoulder 02/13/2020  . Post-operative state 02/13/2020  . Ankle dislocation, right, initial encounter 03/10/2019  . Trimalleolar fracture of ankle, closed, right, initial encounter   . Chronic left shoulder pain 02/19/2019  . Anxiety 12/17/2016  . 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[place]) 12/17/2016  . Osteopenia 12/17/2016  . Paroxysmal atrial fibrillation ([place]) 12/17/2016  . Pure hypercholesterolemia 12/17/2016  . Rectal cancer ([place]) 07/30/2015   Past Medical History:  Diagnosis Date  . 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 Arthritis    "hands, fingers, back" (02/27/2014)  . Atrial fibrillation ([place])   . Cat scratch of right hand 02/24/2014  . Dysrhythmia    a fib  . Fall 01/2018  . Fall from horse 01/2018  . Fracture, ankle 02/2019   Right   . High cholesterol   . History of stomach ulcers ~ 1966  . Osteopenia     Family History  Problem Relation Age of Onset  . Breast cancer Maternal Aunt   . Breast cancer Maternal Grandmother   . Heart disease Mother   . Stroke Father   . Rectal cancer Paternal Grandmother   .  Breast cancer Sister 39  . Breast cancer Sister 55    Past Surgical History:  Procedure Laterality Date  . ANUS SURGERY  2005   "biopsy"  . BREAST CYST EXCISION  1966  . DILATION AND CURETTAGE OF UTERUS  1980's   S/P miscarriage  . EXTERNAL FIXATION LEG Right 03/10/2019   Procedure: OPEN REDUCTION INTERNAL FIXATION RIGHT ANKLE;  Surgeon: Julie Bond, MD;  Location: [facility];  Service: Orthopedics;  Laterality: Right;  Marland Kitchen VARICOSE VEIN SURGERY Left    left leg   Social History   Occupational History  . Not on file  Tobacco Use  . Smoking status: Former Smoker    Packs/day: 1.00    Years: 30.00    Pack years: 30.00    Types: Cigarettes  . Smokeless tobacco: Never Used  Substance and  Sexual Activity  . Alcohol use: No  . Drug use: No  . Sexual activity: Yes    Partners: Male    Birth control/protection: Post-menopausal     Julie Balding, MD   Note - This record has been created using Editor, commissioning.  Chart creation errors have been sought, but may not always  have been located. Such creation errors do not reflect on  the standard of medical care.

## 2020-04-10 ENCOUNTER — Ambulatory Visit: Payer: Medicare Other | Admitting: Physical Therapy

## 2020-04-10 ENCOUNTER — Encounter: Payer: Self-pay | Admitting: Physical Therapy

## 2020-04-10 DIAGNOSIS — G8929 Other chronic pain: Secondary | ICD-10-CM | POA: Diagnosis not present

## 2020-04-10 DIAGNOSIS — M25612 Stiffness of left shoulder, not elsewhere classified: Secondary | ICD-10-CM | POA: Diagnosis not present

## 2020-04-10 DIAGNOSIS — M6281 Muscle weakness (generalized): Secondary | ICD-10-CM | POA: Diagnosis not present

## 2020-04-10 DIAGNOSIS — M25512 Pain in left shoulder: Secondary | ICD-10-CM | POA: Diagnosis not present

## 2020-04-10 NOTE — Therapy (Signed)
[place], [place], [address] Phone: [phone]   Fax:  [phone]  Physical Therapy Treatment  Patient Details  Name: Julie Mann MRN: [id number] Date of Birth: May 25, 1946 Referring Provider (PT): Dr. Durward Fortes   Encounter Date: 04/10/2020  PT End of Session - 04/10/20 1137    Visit Number  15    Number of Visits  20    Date for PT Re-Evaluation  05/15/20    Authorization Type  AARP    Progress Note Due on Visit  20    PT Start Time  1057    PT Stop Time  1138    PT Time Calculation (min)  41 min    Activity Tolerance  Patient tolerated treatment well    Behavior During Therapy  [facility] for tasks assessed/performed       Past Medical History:  Diagnosis Date  . 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 Arthritis    "hands, fingers, back" (02/27/2014)  . Atrial fibrillation ([place])   . Cat scratch of right hand 02/24/2014  . Dysrhythmia    a fib  . Fall 01/2018  . Fall from horse 01/2018  . Fracture, ankle 02/2019   Right   . High cholesterol   . History of stomach ulcers ~ 1966  . Osteopenia     Past Surgical History:  Procedure Laterality Date  . ANUS SURGERY  2005   "biopsy"  . BREAST CYST EXCISION  1966  . DILATION AND CURETTAGE OF UTERUS  1980's   S/P miscarriage  . EXTERNAL FIXATION LEG Right 03/10/2019   Procedure: OPEN REDUCTION INTERNAL FIXATION RIGHT ANKLE;  Surgeon: Marchia Bond, MD;  Location: [place];  Service: Orthopedics;  Laterality: Right;  Marland Kitchen VARICOSE VEIN SURGERY Left    left leg    There were no vitals filed for this visit.  Subjective Assessment - 04/10/20 1101    Subjective  Lt shoulder is a little sore today - "some days it just hurts"    Limitations  Lifting;House hold activities    Patient Stated Goals  regain function of shoulder    Currently in Pain?  Yes    Pain Score  2     Pain Location  Shoulder    Pain Orientation  Left    Pain Descriptors /  Indicators  Aching    Pain Type  Acute pain;Surgical pain    Pain Onset  More than a month ago    Pain Frequency  Constant    Aggravating Factors   active movements    Pain Relieving Factors  pain meds                        [facility] Adult PT Treatment/Exercise - 04/10/20 1102      Elbow Exercises   Elbow Flexion  Left;Standing;Bar weights/barbell   3x10; 3# x 15, 2# x 15     Shoulder Exercises: Standing   External Rotation  Left   3x10   Theraband Level (Shoulder External Rotation)  Level 3 (Green)    Internal Rotation  Left   3x10   Theraband Level (Shoulder Internal Rotation)  Level 3 (Green)    ABduction  Right;Weights;15 reps    Shoulder ABduction Weight (lbs)  1    Other Standing Exercises  hammer curl with overhead press 3x10, 2#    Other Standing Exercises  tricep press 2# 2x10, left  Shoulder Exercises: ROM/Strengthening   UBE (Upper Arm Bike)  L5 x 6 min (3 min each directions)    Cybex Row Limitations  15# 3x10      Shoulder Exercises: Stretch   Internal Rotation Stretch  5 reps   15 sec                 PT Long Term Goals - 04/03/20 1105      PT LONG TERM GOAL #1   Title  Patient will demonstrate/report pain at worst less than or equal to 2/10 to facilitate minimal limitation in daily activity secondary to pain symptoms.    Baseline  5/21: still up to 3/10 - improved, not to goal    Time  6    Period  Weeks    Status  On-going    Target Date  05/15/20      PT LONG TERM GOAL #2   Title  Patient will demonstrate independent use of home exercise program to facilitate ability to maintain/progress functional gains from skilled physical therapy services.    Baseline  has been compliant but progressed HEP today    Time  6    Period  Weeks    Status  On-going    Target Date  05/15/20      PT LONG TERM GOAL #3   Title  Patient will demonstrate Lt [place] joint mobility WFL to facilitate usual self care, dressing, reaching overhead at  PLOF s limitation due to symptoms.    Baseline  PROM WFL, still lacking mild AROM    Time  8    Period  Weeks    Status  Achieved      PT LONG TERM GOAL #4   Title  Pt. will demonstrate Lt UE MMT equal to Rt to facilitate usual lifting, carrying, recreational activity (violin) at PLOF.    Baseline  5/21: flexion and abduction 4/5 on Lt    Time  6    Period  Weeks    Status  On-going    Target Date  05/15/20      PT LONG TERM GOAL #5   Title  Pt. will demonstrate/report ability to play instrument at PLOF s limitation.    Baseline  up to 30 min per day now    Time  8    Period  Weeks    Status  On-going            Plan - 04/10/20 1139    Clinical Impression Statement  Pt tolerated strengthening session well today, and anticipate transition to home program in the next 1-2 weeks.  Will continue to benefit from PT to maximize function.    Examination-Activity Limitations  Bathing;Carry;Lift;Reach Overhead    Armed forces operational officer Activity;Interpersonal Relationship;Yard Work;Meal Prep    Stability/Clinical Decision Making  Stable/Uncomplicated    Rehab Potential  Good    PT Frequency  2x / week   2x/wk decreasing to 1x/wk or biweekly   PT Duration  6 weeks    PT Treatment/Interventions  ADLs/Self Care Home Management;Electrical Stimulation;Ultrasound;Traction;Moist Heat;Iontophoresis 4mg /ml Dexamethasone;Gait training;Stair training;Functional mobility training;Therapeutic activities;Therapeutic exercise;Balance training;Neuromuscular re-education;Manual techniques;Patient/family education;Passive range of motion;Dry needling;Joint Manipulations;Spinal Manipulations;Vasopneumatic Device;Taping    PT Next Visit Plan  Manual for pain/mobility, progress PROM, AROM activity, RTC strength, bicep strength and endurance    PT Home Exercise Plan  [id number]    Consulted and Agree with Plan of Care  Patient       Patient will benefit  from skilled therapeutic  intervention in order to improve the following deficits and impairments:  Decreased coordination, Decreased range of motion, Impaired UE functional use, Decreased activity tolerance, Pain, Hypomobility, Decreased mobility, Decreased strength, Postural dysfunction  Visit Diagnosis: Chronic left shoulder pain  Stiffness of left shoulder, not elsewhere classified  Muscle weakness (generalized)     Problem List Patient Active Problem List   Diagnosis Date Noted  . Biceps tendonosis of left shoulder 02/13/2020  . Impingement syndrome of left shoulder 02/13/2020  . Post-operative state 02/13/2020  . Ankle dislocation, right, initial encounter 03/10/2019  . Trimalleolar fracture of ankle, closed, right, initial encounter   . Chronic left shoulder pain 02/19/2019  . Anxiety 12/17/2016  . 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[place]) 12/17/2016  . Osteopenia 12/17/2016  . Paroxysmal atrial fibrillation ([place]) 12/17/2016  . Pure hypercholesterolemia 12/17/2016  . Rectal cancer ([place]) 07/30/2015        Laureen Abrahams, PT, DPT 04/10/20 11:43 AM     [facility] Physical Therapy [address] [place], [place], [address] Phone: [phone]   Fax:  [phone]  Name: RIHA BRACKER MRN: [id number] Date of Birth: 05-26-1946

## 2020-04-21 ENCOUNTER — Other Ambulatory Visit: Payer: Self-pay

## 2020-04-21 ENCOUNTER — Ambulatory Visit: Payer: Medicare Other | Admitting: Physical Therapy

## 2020-04-21 ENCOUNTER — Other Ambulatory Visit: Payer: Self-pay | Admitting: Orthopedic Surgery

## 2020-04-21 ENCOUNTER — Encounter: Payer: Self-pay | Admitting: Physical Therapy

## 2020-04-21 DIAGNOSIS — M25612 Stiffness of left shoulder, not elsewhere classified: Secondary | ICD-10-CM

## 2020-04-21 DIAGNOSIS — G8929 Other chronic pain: Secondary | ICD-10-CM

## 2020-04-21 DIAGNOSIS — M25512 Pain in left shoulder: Secondary | ICD-10-CM | POA: Diagnosis not present

## 2020-04-21 DIAGNOSIS — M25571 Pain in right ankle and joints of right foot: Secondary | ICD-10-CM

## 2020-04-21 DIAGNOSIS — M6281 Muscle weakness (generalized): Secondary | ICD-10-CM | POA: Diagnosis not present

## 2020-04-21 NOTE — Therapy (Signed)
[place], [place], [address] Phone: [phone]   Fax:  [phone]  Physical Therapy Treatment  Patient Details  Name: Julie Mann MRN: [id number] Date of Birth: 1946-08-05 Referring Provider (PT): Dr. Durward Fortes   Encounter Date: 04/21/2020  PT End of Session - 04/21/20 1245    Visit Number  16    Number of Visits  20    Date for PT Re-Evaluation  05/15/20    Authorization Type  AARP    Progress Note Due on Visit  20    PT Start Time  1145    PT Stop Time  1226    PT Time Calculation (min)  41 min    Activity Tolerance  Patient tolerated treatment well    Behavior During Therapy  [facility] for tasks assessed/performed       Past Medical History:  Diagnosis Date  . 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 Arthritis    "hands, fingers, back" (02/27/2014)  . Atrial fibrillation ([place])   . Cat scratch of right hand 02/24/2014  . Dysrhythmia    a fib  . Fall 01/2018  . Fall from horse 01/2018  . Fracture, ankle 02/2019   Right   . High cholesterol   . History of stomach ulcers ~ 1966  . Osteopenia     Past Surgical History:  Procedure Laterality Date  . ANUS SURGERY  2005   "biopsy"  . BREAST CYST EXCISION  1966  . DILATION AND CURETTAGE OF UTERUS  1980's   S/P miscarriage  . EXTERNAL FIXATION LEG Right 03/10/2019   Procedure: OPEN REDUCTION INTERNAL FIXATION RIGHT ANKLE;  Surgeon: Marchia Bond, MD;  Location: [place];  Service: Orthopedics;  Laterality: Right;  Marland Kitchen VARICOSE VEIN SURGERY Left    left leg    There were no vitals filed for this visit.  Subjective Assessment - 04/21/20 1145    Subjective  shoulder is doing well - has been busy with working and besides fatigue no issues    Limitations  Lifting;House hold activities    Patient Stated Goals  regain function of shoulder    Currently in Pain?  No/denies    Pain Onset  More than a month ago                         [facility] Adult PT Treatment/Exercise - 04/21/20 1148      Shoulder Exercises: Standing   Diagonals  Left;20 reps;Weights    Diagonals Weight (lbs)  1    Other Standing Exercises  hammer curl with overhead press 3x10, 2#    Other Standing Exercises  flexion/horizontal abduction 2x10 on Lt      Shoulder Exercises: ROM/Strengthening   UBE (Upper Arm Bike)  L5 x 6 min (3 min each directions)    Lat Pull Limitations  10# 3x10    Cybex Row Limitations  20# 3x10    Other ROM/Strengthening Exercises  tricep press 10# 3x10 bil    Other ROM/Strengthening Exercises  bent over fly Lt 3x10; 1#                  PT Long Term Goals - 04/03/20 1105      PT LONG TERM GOAL #1   Title  Patient will demonstrate/report pain at worst less than or equal to 2/10 to facilitate minimal limitation in daily activity secondary to pain symptoms.  Baseline  5/21: still up to 3/10 - improved, not to goal    Time  6    Period  Weeks    Status  On-going    Target Date  05/15/20      PT LONG TERM GOAL #2   Title  Patient will demonstrate independent use of home exercise program to facilitate ability to maintain/progress functional gains from skilled physical therapy services.    Baseline  has been compliant but progressed HEP today    Time  6    Period  Weeks    Status  On-going    Target Date  05/15/20      PT LONG TERM GOAL #3   Title  Patient will demonstrate Lt [place] joint mobility WFL to facilitate usual self care, dressing, reaching overhead at PLOF s limitation due to symptoms.    Baseline  PROM WFL, still lacking mild AROM    Time  8    Period  Weeks    Status  Achieved      PT LONG TERM GOAL #4   Title  Pt. will demonstrate Lt UE MMT equal to Rt to facilitate usual lifting, carrying, recreational activity (violin) at PLOF.    Baseline  5/21: flexion and abduction 4/5 on Lt    Time  6    Period  Weeks    Status  On-going    Target Date  05/15/20       PT LONG TERM GOAL #5   Title  Pt. will demonstrate/report ability to play instrument at PLOF s limitation.    Baseline  up to 30 min per day now    Time  8    Period  Weeks    Status  On-going            Plan - 04/21/20 1245    Clinical Impression Statement  Pt overall doing well and plan for d/c next visit.  Will need to update HEP for progressing strength in Lt shoulder.    Examination-Activity Limitations  Bathing;Carry;Lift;Reach Overhead    Armed forces operational officer Activity;Interpersonal Relationship;Yard Work;Meal Prep    Stability/Clinical Decision Making  Stable/Uncomplicated    Rehab Potential  Good    PT Frequency  2x / week   2x/wk decreasing to 1x/wk or biweekly   PT Duration  6 weeks    PT Treatment/Interventions  ADLs/Self Care Home Management;Electrical Stimulation;Ultrasound;Traction;Moist Heat;Iontophoresis 4mg /ml Dexamethasone;Gait training;Stair training;Functional mobility training;Therapeutic activities;Therapeutic exercise;Balance training;Neuromuscular re-education;Manual techniques;Patient/family education;Passive range of motion;Dry needling;Joint Manipulations;Spinal Manipulations;Vasopneumatic Device;Taping    PT Next Visit Plan  check goals, d/c PT for shoulder, give tshirt    PT Home Exercise Plan  [id number]    Consulted and Agree with Plan of Care  Patient       Patient will benefit from skilled therapeutic intervention in order to improve the following deficits and impairments:  Decreased coordination, Decreased range of motion, Impaired UE functional use, Decreased activity tolerance, Pain, Hypomobility, Decreased mobility, Decreased strength, Postural dysfunction  Visit Diagnosis: Chronic left shoulder pain  Stiffness of left shoulder, not elsewhere classified  Muscle weakness (generalized)     Problem List Patient Active Problem List   Diagnosis Date Noted  . Biceps tendonosis of left shoulder 02/13/2020  .  Impingement syndrome of left shoulder 02/13/2020  . Post-operative state 02/13/2020  . Ankle dislocation, right, initial encounter 03/10/2019  . Trimalleolar fracture of ankle, closed, right, initial encounter   . Chronic left shoulder pain 02/19/2019  . Anxiety  12/17/2016  . 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[place]) 12/17/2016  . Osteopenia 12/17/2016  . Paroxysmal atrial fibrillation ([place]) 12/17/2016  . Pure hypercholesterolemia 12/17/2016  . Rectal cancer ([place]) 07/30/2015     Laureen Abrahams, PT, DPT 04/21/20 12:48 PM   [facility] Physical Therapy [address] [place], [place], [address] Phone: [phone]   Fax:  [phone]  Name: Julie Mann MRN: [id number] Date of Birth: 05-05-1946

## 2020-04-28 ENCOUNTER — Encounter: Payer: Medicare Other | Admitting: Rehabilitative and Restorative Service Providers"

## 2020-05-04 ENCOUNTER — Ambulatory Visit: Payer: Medicare Other | Admitting: Physical Therapy

## 2020-05-04 ENCOUNTER — Other Ambulatory Visit: Payer: Self-pay

## 2020-05-04 DIAGNOSIS — M6281 Muscle weakness (generalized): Secondary | ICD-10-CM | POA: Diagnosis not present

## 2020-05-04 DIAGNOSIS — G8929 Other chronic pain: Secondary | ICD-10-CM

## 2020-05-04 DIAGNOSIS — M25512 Pain in left shoulder: Secondary | ICD-10-CM | POA: Diagnosis not present

## 2020-05-04 DIAGNOSIS — M25612 Stiffness of left shoulder, not elsewhere classified: Secondary | ICD-10-CM | POA: Diagnosis not present

## 2020-05-04 NOTE — Therapy (Signed)
[facility] Physical Therapy [address] [place], [place], [address] Phone: [phone]   Fax:  [phone]  Physical Therapy Treatment/Discharge PHYSICAL THERAPY DISCHARGE SUMMARY  Visits from Start of Care: 17  Current functional level related to goals / functional outcomes: See below   Remaining deficits: See below   Education / Equipment: HEP Plan: Patient agrees to discharge.  Patient goals were met. Patient is being discharged due to meeting the stated rehab goals.  ?????       Patient Details  Name: Julie Mann MRN: [id number] Date of Birth: [birth year] Referring Provider (PT): Dr. Durward Fortes   Encounter Date: 05/04/2020   PT End of Session - 05/04/20 1113    Visit Number 17    Number of Visits 20    Date for PT Re-Evaluation 05/15/20    Authorization Type AARP    Progress Note Due on Visit 20    PT Start Time 1019    PT Stop Time 1102    PT Time Calculation (min) 43 min    Activity Tolerance Patient tolerated treatment well    Behavior During Therapy [facility] for tasks assessed/performed           Past Medical History:  Diagnosis Date  . 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 Arthritis    "hands, fingers, back" (02/27/2014)  . Atrial fibrillation ([address])   . Cat scratch of right hand 02/24/2014  . Dysrhythmia    a fib  . Fall 01/2018  . Fall from horse 01/2018  . Fracture, ankle 02/2019   Right   . High cholesterol   . History of stomach ulcers ~ 1966  . Osteopenia     Past Surgical History:  Procedure Laterality Date  . ANUS SURGERY  2005   "biopsy"  . BREAST CYST EXCISION  1966  . DILATION AND CURETTAGE OF UTERUS  1980's   S/P miscarriage  . EXTERNAL FIXATION LEG Right 03/10/2019   Procedure: OPEN REDUCTION INTERNAL FIXATION RIGHT ANKLE;  Surgeon: Marchia Bond, MD;  Location: [place];  Service: Orthopedics;  Laterality: Right;  Marland Kitchen VARICOSE VEIN SURGERY Left    left leg    There were no vitals  filed for this visit.   Subjective Assessment - 05/04/20 1112    Subjective her shoulder is doing well, able to play violin 90 minutes without much pain just sorness. Wants to DC from shoulder PT and begin new episode of ankle PT and we have new referral for this.    Limitations Lifting;House hold activities    Patient Stated Goals regain function of shoulder    Pain Onset More than a month ago              [facility] PT Assessment - 05/04/20 0001      Assessment   Medical Diagnosis Lt shoulder arthroscopy, SAD, DCR, open biceps tenodesis    Referring Provider (PT) Dr. Durward Fortes      AROM   Overall AROM Comments [facility] Lt shoulder ROM in standing and equal to Rt side      Strength   Strength Assessment Site Elbow    Right/Left Shoulder Left    Left Shoulder Flexion 5/5    Left Shoulder ABduction 4+/5    Left Shoulder Internal Rotation 5/5    Left Shoulder External Rotation 5/5    Left Elbow Flexion 5/5    Left Elbow Extension 5/5  [facility] Adult PT Treatment/Exercise - 05/04/20 0001      Shoulder Exercises: Standing   ABduction Left;20 reps    Shoulder ABduction Weight (lbs) 1    Diagonals Left;20 reps;Weights    Diagonals Weight (lbs) 1    Other Standing Exercises hammer curl with overhead press 3x10, 2#      Shoulder Exercises: Pulleys   Flexion 2 minutes    ABduction 2 minutes      Shoulder Exercises: ROM/Strengthening   UBE (Upper Arm Bike) L5 x 6 min (3 min each directions)    Lat Pull Limitations 10# X20    Cybex Row Limitations 20# 3x10    Other ROM/Strengthening Exercises tricep pushdown with rope 10# 30 bil                  PT Education - 05/04/20 1113    Education Details HEP review    Person(s) Educated Patient    Methods Explanation;Demonstration    Comprehension Verbalized understanding;Returned demonstration               PT Long Term Goals - 05/04/20 1116      PT LONG TERM GOAL #1   Title Patient  will demonstrate/report pain at worst less than or equal to 2/10 to facilitate minimal limitation in daily activity secondary to pain symptoms.    Baseline met today    Time 6    Period Weeks    Status Achieved      PT LONG TERM GOAL #2   Title Patient will demonstrate independent use of home exercise program to facilitate ability to maintain/progress functional gains from skilled physical therapy services.    Baseline has been compliant and reivewed today with good understanding    Time 6    Period Weeks    Status Achieved      PT LONG TERM GOAL #3   Title Patient will demonstrate Lt [place] joint mobility WFL to facilitate usual self care, dressing, reaching overhead at PLOF s limitation due to symptoms.    Baseline met today    Time 8    Period Weeks    Status Achieved      PT LONG TERM GOAL #4   Title Pt. will demonstrate Lt UE MMT equal to Rt to facilitate usual lifting, carrying, recreational activity (violin) at PLOF.    Baseline met today    Time 6    Period Weeks    Status Achieved      PT LONG TERM GOAL #5   Title Pt. will demonstrate/report ability to play instrument at PLOF s limitation.    Baseline 90 minutes and met    Time 8    Period Weeks    Status Achieved                 Plan - 05/04/20 1118    Clinical Impression Statement Has met all PT goals for her Lt shoulder and will discharge this episode of care. She does however have continued ankle pain and previous ORIF so will set her up for new PT eval to address her ankle deficits. She had no further questions or concerns about her shoulder.    PT Next Visit Plan DC her shoulder, will see her for ankle next time    PT Home Exercise Plan [id number]    Consulted and Agree with Plan of Care Patient           Patient will benefit from skilled therapeutic intervention in order  to improve the following deficits and impairments:     Visit Diagnosis: Chronic left shoulder pain  Stiffness of left shoulder,  not elsewhere classified  Muscle weakness (generalized)     Problem List Patient Active Problem List   Diagnosis Date Noted  . Biceps tendonosis of left shoulder 02/13/2020  . Impingement syndrome of left shoulder 02/13/2020  . Post-operative state 02/13/2020  . Ankle dislocation, right, initial encounter 03/10/2019  . Trimalleolar fracture of ankle, closed, right, initial encounter   . Chronic left shoulder pain 02/19/2019  . Anxiety 12/17/2016  . 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[place]) 12/17/2016  . Osteopenia 12/17/2016  . Paroxysmal atrial fibrillation ([place]) 12/17/2016  . Pure hypercholesterolemia 12/17/2016  . Rectal cancer ([place]) 07/30/2015    Debbe Odea ,PT,DPT 05/04/2020, 11:20 AM  [facility] Physical Therapy [address] [place], [place], [address] Phone: [phone]   Fax:  [phone]  Name: Julie Mann MRN: [id number] Date of Birth: [birth year]

## 2020-05-20 ENCOUNTER — Other Ambulatory Visit: Payer: Self-pay

## 2020-05-20 ENCOUNTER — Encounter: Payer: Self-pay | Admitting: Physical Therapy

## 2020-05-20 ENCOUNTER — Ambulatory Visit: Payer: Medicare Other | Admitting: Physical Therapy

## 2020-05-20 DIAGNOSIS — R600 Localized edema: Secondary | ICD-10-CM

## 2020-05-20 DIAGNOSIS — R2689 Other abnormalities of gait and mobility: Secondary | ICD-10-CM | POA: Diagnosis not present

## 2020-05-20 DIAGNOSIS — M6281 Muscle weakness (generalized): Secondary | ICD-10-CM | POA: Diagnosis not present

## 2020-05-20 DIAGNOSIS — M25571 Pain in right ankle and joints of right foot: Secondary | ICD-10-CM | POA: Diagnosis not present

## 2020-05-20 DIAGNOSIS — M25671 Stiffness of right ankle, not elsewhere classified: Secondary | ICD-10-CM | POA: Diagnosis not present

## 2020-05-20 NOTE — Therapy (Signed)
[facility] [place], [place], [address] Phone: [phone]   Fax:  [phone]  Physical Therapy Evaluation  Patient Details  Name: Julie Mann MRN: [id number] Date of Birth: [birth year] Referring Provider (PT): Dr. Meridee Score   Encounter Date: 05/20/2020   PT End of Session - 05/20/20 1342    Visit Number 1    Number of Visits 6    Date for PT Re-Evaluation 07/01/20    Authorization Type AARP    Progress Note Due on Visit 10    PT Start Time 1258    PT Stop Time 1340    PT Time Calculation (min) 42 min    Activity Tolerance Patient tolerated treatment well    Behavior During Therapy [facility] for tasks assessed/performed           Past Medical History:  Diagnosis Date  . 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 Arthritis    "hands, fingers, back" (02/27/2014)  . Atrial fibrillation ([place])   . Cat scratch of right hand 02/24/2014  . Dysrhythmia    a fib  . Fall 01/2018  . Fall from horse 01/2018  . Fracture, ankle 02/2019   Right   . High cholesterol   . History of stomach ulcers ~ 1966  . Osteopenia     Past Surgical History:  Procedure Laterality Date  . ANUS SURGERY  2005   "biopsy"  . BREAST CYST EXCISION  1966  . DILATION AND CURETTAGE OF UTERUS  1980's   S/P miscarriage  . EXTERNAL FIXATION LEG Right 03/10/2019   Procedure: OPEN REDUCTION INTERNAL FIXATION RIGHT ANKLE;  Surgeon: Marchia Bond, MD;  Location: [facility];  Service: Orthopedics;  Laterality: Right;  Marland Kitchen VARICOSE VEIN SURGERY Left    left leg    There were no vitals filed for this visit.    Subjective Assessment - 05/20/20 1301    Subjective Pt is a 74 y/o female who returns to [facility] for continued Rt ankle pain.  Pt in May 2020 had emergency surgery for ankle fx and now has some residual arthritis.  She has been doing HEP and would like to work to progress exercises and see if we can help with ankle.    Limitations House hold  activities;Standing;Walking    Patient Stated Goals update exercises, try to help with arthritis    Currently in Pain? Yes    Pain Score 3     Pain Location Ankle    Pain Orientation Right    Pain Descriptors / Indicators Other (Comment)   stiffness   Pain Type Chronic pain;Surgical pain    Pain Onset More than a month ago    Pain Frequency Constant    Aggravating Factors  using it, walking    Pain Relieving Factors medication, compression stocking              [facility] PT Assessment - 05/20/20 1306      Assessment   Medical Diagnosis Rt ankle pain    Referring Provider (PT) Dr. Meridee Score    Onset Date/Surgical Date --   May 2020   Hand Dominance Right    Next MD Visit 05/21/20    Prior Therapy yes      Precautions   Precautions None      Restrictions   Weight Bearing Restrictions No      Balance Screen   Has the patient fallen in the past 6 months No  Has the patient had a decrease in activity level because of a fear of falling?  No    Is the patient reluctant to leave their home because of a fear of falling?  No      Home Ecologist residence    Living Arrangements Spouse/significant other    Home Access Stairs to enter    Entrance Stairs-Number of Steps 1    [place] Two level;Bed/bath upstairs    Alternate Level Stairs-Number of Steps 14    Alternate Level Stairs-Rails Left    Additional Comments no difficulty with going up/down stairs      Prior Function   Level of Independence Independent    Vocation Other (comment)   violin   Leisure yard work, self care, household activity      Cognition   Overall Cognitive Status Within Functional Limits for tasks assessed      Observation/Other Assessments   Observations increased swelling noted in Rt ankle      Functional Tests   Functional tests Single leg stance      Single Leg Stance   Comments RLE < 3 sec      AROM   AROM Assessment Site Ankle    Right/Left Ankle Right     Right Ankle Dorsiflexion 3    Right Ankle Plantar Flexion 50    Right Ankle Inversion 10    Right Ankle Eversion 15      PROM   PROM Assessment Site Ankle    Right/Left Ankle Right    Right Ankle Dorsiflexion 4    Right Ankle Plantar Flexion 54    Right Ankle Inversion 20    Right Ankle Eversion 25      Strength   Strength Assessment Site Ankle    Right/Left Ankle Right    Right Ankle Dorsiflexion 3+/5    Right Ankle Plantar Flexion 3+/5    Right Ankle Inversion 3/5    Right Ankle Eversion 3/5      Ambulation/Gait   Gait Pattern Decreased dorsiflexion - right                      Objective measurements completed on examination: See above findings.       [facility] Adult PT Treatment/Exercise - 05/20/20 1306      Exercises   Exercises Other Exercises    Other Exercises  see pt instructions, performed 3-10 reps of each exercise                  PT Education - 05/20/20 1342    Education Details HEP    Person(s) Educated Patient    Methods Explanation;Demonstration;Handout    Comprehension Verbalized understanding;Returned demonstration               PT Long Term Goals - 05/20/20 1348      PT LONG TERM GOAL #1   Title independent with HEP    Status New    Target Date 07/01/20      PT LONG TERM GOAL #2   Title improve SLS to >/= 10 sec on RLE for improved balance and strength    Status New    Target Date 07/01/20      PT LONG TERM GOAL #3   Title report 30% improvement in pain for improved function    Status New    Target Date 07/01/20      PT LONG TERM GOAL #4   Title  n/a      PT LONG TERM GOAL #5   Title n/a                  Plan - 05/20/20 1342    Clinical Impression Statement Pt is a 74 y/o female who returns to [facility] following Rt ankle fx s/p ORIF approx 14 months ago.  She is here to progress her home exercises and work to strengthen her ankle.  She demonstrates mild ROM and gait limitations, with most significant  limitation being weakness.  Updated HEP today and will see up to 1x/wk x 6 weeks to address.    Personal Factors and Comorbidities Comorbidity 2    Examination-Activity Limitations Locomotion Level;Stand;Squat    Examination-Participation Restrictions Community Activity;Yard Work;Meal Prep    Stability/Clinical Decision Making Stable/Uncomplicated    Clinical Decision Making Low    Rehab Potential Poor    PT Frequency 1x / week   1x/w PRN   PT Duration 6 weeks    PT Treatment/Interventions ADLs/Self Care Home Management;Electrical Stimulation;Ultrasound;Traction;Moist Heat;Iontophoresis 4mg /ml Dexamethasone;Gait training;Stair training;Functional mobility training;Therapeutic activities;Therapeutic exercise;Balance training;Neuromuscular re-education;Manual techniques;Patient/family education;Passive range of motion;Dry needling;Joint Manipulations;Spinal Manipulations;Vasopneumatic Device;Taping    PT Next Visit Plan review HEP and progress PRN    PT Home Exercise Plan Access Code: [email or web address]    Consulted and Agree with Plan of Care Patient           Patient will benefit from skilled therapeutic intervention in order to improve the following deficits and impairments:  Decreased coordination, Decreased range of motion, Decreased activity tolerance, Pain, Hypomobility, Decreased mobility, Decreased strength, Increased fascial restricitons, Increased muscle spasms, Decreased balance, Difficulty walking, Impaired flexibility, Increased edema, Abnormal gait  Visit Diagnosis: Pain in right ankle and joints of right foot - Plan: PT plan of care cert/re-cert  Stiffness of right ankle, not elsewhere classified - Plan: PT plan of care cert/re-cert  Muscle weakness (generalized) - Plan: PT plan of care cert/re-cert  Other abnormalities of gait and mobility - Plan: PT plan of care cert/re-cert  Localized edema - Plan: PT plan of care cert/re-cert     Problem List Patient Active Problem List    Diagnosis Date Noted  . Biceps tendonosis of left shoulder 02/13/2020  . Impingement syndrome of left shoulder 02/13/2020  . Post-operative state 02/13/2020  . Ankle dislocation, right, initial encounter 03/10/2019  . Trimalleolar fracture of ankle, closed, right, initial encounter   . Chronic left shoulder pain 02/19/2019  . Anxiety 12/17/2016  . 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[place]) 12/17/2016  . Osteopenia 12/17/2016  . Paroxysmal atrial fibrillation ([place]) 12/17/2016  . Pure hypercholesterolemia 12/17/2016  . Rectal cancer ([place]) 07/30/2015      Laureen Abrahams, PT, DPT 05/20/20 1:53 PM     [facility] [address] [place], [place], [address] Phone: [phone]   Fax:  [phone]  Name: Julie Mann MRN: [id number] Date of Birth: 1946/03/03

## 2020-05-20 NOTE — Patient Instructions (Signed)
Access Code: [email or web address] URL: [email or web address] Date: 05/20/2020 Prepared by: Faustino Congress  Exercises Gastroc Stretch on Wall - 2 x daily - 7 x weekly - 3 reps - 1 sets - 30 sec hold Soleus Stretch on Wall - 2 x daily - 7 x weekly - 3 reps - 1 sets - 30 sec hold Seated Ankle Eversion with Resistance - 2 x daily - 7 x weekly - 2 sets - 20 reps Seated Ankle Dorsiflexion with Resistance - 2 x daily - 7 x weekly - 20 reps - 2 sets Seated Ankle Plantar Flexion with Resistance Loop - 2 x daily - 7 x weekly - 2 sets - 20 reps Seated Figure 4 Ankle Inversion with Resistance - 2 x daily - 7 x weekly - 2 sets - 20 reps Standing Single Leg Stance with Unilateral Counter Support - 2 x daily - 7 x weekly - 3 reps - 1 sets - 10 sec hold Single Leg Stance on Foam Pad - 1 x daily - 7 x weekly - 5 reps - 1 sets - 10-15 hold Walking Tandem Stance - 1 x daily - 7 x weekly - 3 sets - 10 reps Heel rises with counter support - 2 x daily - 7 x weekly - 2 sets - 20 reps

## 2020-05-21 ENCOUNTER — Encounter: Payer: Self-pay | Admitting: Orthopedic Surgery

## 2020-05-21 ENCOUNTER — Ambulatory Visit: Payer: Medicare Other | Admitting: Physician Assistant

## 2020-05-21 VITALS — Ht 67.0 in | Wt 137.0 lb

## 2020-05-21 DIAGNOSIS — M25571 Pain in right ankle and joints of right foot: Secondary | ICD-10-CM

## 2020-05-21 MED ORDER — LIDOCAINE HCL 1 % IJ SOLN
2.0000 mL | INTRAMUSCULAR | Status: AC | PRN
  Administered 2020-05-21: 2 mL

## 2020-05-21 MED ORDER — METHYLPREDNISOLONE ACETATE 40 MG/ML IJ SUSP
40.0000 mg | INTRAMUSCULAR | Status: AC | PRN
  Administered 2020-05-21: 40 mg via INTRA_ARTICULAR

## 2020-05-21 NOTE — Progress Notes (Signed)
Office Visit Note   Patient: Julie Mann           Date of Birth: 1946-03-14           MRN: [id number] Visit Date: 05/21/2020              Requested by: Lajean Manes, MD [address] [place],   [address] PCP: Lajean Manes, MD  Chief Complaint  Patient presents with  . Right Ankle - Follow-up    02/27/20 s/p cortisone injection  Hx trimal ankle fx ORIF 1 year ago.       HPI: Is a pleasant 74 year old woman with a history of right ankle traumatic arthritis.  She had a cortisone injection into the ankle in April.  She is going to the beach for some vacation and the injection was helpful but has worn off she is asking for an injection today  Assessment & Plan: Visit Diagnoses: No diagnosis found.  Plan: Patient may follow-up as needed  Follow-Up Instructions: No follow-ups on file.   Ortho Exam  Patient is alert, oriented, no adenopathy, well-dressed, normal affect, normal respiratory effort. Right ankle no swelling no erythema pulses are palpable tenderness over the medial joint line  Imaging: No results found. No images are attached to the encounter.  Labs: No results found for: HGBA1C, ESRSEDRATE, CRP, LABURIC, REPTSTATUS, GRAMSTAIN, CULT, LABORGA   Lab Results  Component Value Date   ALBUMIN 4.1 03/24/2009   ALBUMIN 3.9 09/24/2008   ALBUMIN 4.2 03/24/2008    No results found for: MG No results found for: VD25OH  No results found for: PREALBUMIN CBC EXTENDED Latest Ref Rng & Units 03/10/2019 02/05/2018 10/25/2016  WBC 4.0 - 10.5 K/uL 13.1(H) 15.7(H) 6.4  RBC 3.87 - 5.11 MIL/uL 4.70 5.17(H) 5.20(H)  HGB 12.0 - 15.0 g/dL 12.8 14.2 14.0  HCT 36 - 46 % 40.5 43.5 42.7  PLT 150 - 400 K/uL 287 372 311  NEUTROABS 1.7 - 7.7 K/uL - 13.9(H) -  LYMPHSABS 0.7 - 4.0 K/uL - 1.0 -     Body mass index is 21.46 kg/m.  Orders:  No orders of the defined types were placed in this encounter.  No orders of the defined types were placed in this  encounter.    Procedures: Medium Joint Inj on 05/21/2020 9:40 AM Indications: pain and diagnostic evaluation Details: 22 G 1.5 in needle, anteromedial approach Medications: 2 mL lidocaine 1 %; 40 mg methylPREDNISolone acetate 40 MG/ML Outcome: tolerated well, no immediate complications Procedure, treatment alternatives, risks and benefits explained, specific risks discussed. Consent was given by the patient.      Clinical Data: No additional findings.  ROS:  All other systems negative, except as noted in the HPI. Review of Systems  Objective: Vital Signs: Ht 5\' 7"  (1.702 m)   Wt 137 lb (62.1 kg)   LMP 11/14/1992   BMI 21.46 kg/m   Specialty Comments:  No specialty comments available.  PMFS History: Patient Active Problem List   Diagnosis Date Noted  . Biceps tendonosis of left shoulder 02/13/2020  . Impingement syndrome of left shoulder 02/13/2020  . Post-operative state 02/13/2020  . Ankle dislocation, right, initial encounter 03/10/2019  . Trimalleolar fracture of ankle, closed, right, initial encounter   . Chronic left shoulder pain 02/19/2019  . Anxiety 12/17/2016  . 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[place]) 12/17/2016  . Osteopenia 12/17/2016  . Paroxysmal atrial fibrillation ([place]) 12/17/2016  . Pure hypercholesterolemia 12/17/2016  . Rectal cancer ([place])  07/30/2015   Past Medical History:  Diagnosis Date  . 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 Arthritis    "hands, fingers, back" (02/27/2014)  . Atrial fibrillation ([place])   . Cat scratch of right hand 02/24/2014  . Dysrhythmia    a fib  . Fall 01/2018  . Fall from horse 01/2018  . Fracture, ankle 02/2019   Right   . High cholesterol   . History of stomach ulcers ~ 1966  . Osteopenia     Family History  Problem Relation Age of Onset  . Breast cancer Maternal Aunt   . Breast cancer Maternal Grandmother   . Heart disease Mother   . Stroke Father   . Rectal cancer Paternal  Grandmother   . Breast cancer Sister 17  . Breast cancer Sister 82    Past Surgical History:  Procedure Laterality Date  . ANUS SURGERY  2005   "biopsy"  . BREAST CYST EXCISION  1966  . DILATION AND CURETTAGE OF UTERUS  1980's   S/P miscarriage  . EXTERNAL FIXATION LEG Right 03/10/2019   Procedure: OPEN REDUCTION INTERNAL FIXATION RIGHT ANKLE;  Surgeon: Marchia Bond, MD;  Location: [facility];  Service: Orthopedics;  Laterality: Right;  Marland Kitchen VARICOSE VEIN SURGERY Left    left leg   Social History   Occupational History  . Not on file  Tobacco Use  . Smoking status: Former Smoker    Packs/day: 1.00    Years: 30.00    Pack years: 30.00    Types: Cigarettes  . Smokeless tobacco: Never Used  Vaping Use  . Vaping Use: Never used  Substance and Sexual Activity  . Alcohol use: No  . Drug use: No  . Sexual activity: Yes    Partners: Male    Birth control/protection: Post-menopausal

## 2020-05-28 ENCOUNTER — Encounter: Payer: Medicare Other | Admitting: Physical Therapy

## 2020-06-01 ENCOUNTER — Other Ambulatory Visit: Payer: Self-pay | Admitting: Obstetrics & Gynecology

## 2020-06-01 DIAGNOSIS — F419 Anxiety disorder, unspecified: Secondary | ICD-10-CM

## 2020-06-01 NOTE — Telephone Encounter (Signed)
Medication refill request: Lorazepam Last AEX:  07/25/19 SM Next AEX: 09/18/20 Last MMG (if hormonal medication request): n/a Refill authorized: Today, please advise

## 2020-06-02 ENCOUNTER — Encounter: Payer: Medicare Other | Admitting: Physical Therapy

## 2020-06-03 NOTE — Telephone Encounter (Signed)
Pharmacy called to check status of refill.

## 2020-06-04 NOTE — Telephone Encounter (Signed)
Spoke with pharmacy and notified that refill is pending approval. Pharmacy verbalizes understanding and is agreeable.

## 2020-06-10 ENCOUNTER — Encounter: Payer: Self-pay | Admitting: Physical Therapy

## 2020-06-10 ENCOUNTER — Ambulatory Visit: Payer: Medicare Other | Admitting: Physical Therapy

## 2020-06-10 ENCOUNTER — Other Ambulatory Visit: Payer: Self-pay

## 2020-06-10 DIAGNOSIS — M25671 Stiffness of right ankle, not elsewhere classified: Secondary | ICD-10-CM

## 2020-06-10 DIAGNOSIS — R2689 Other abnormalities of gait and mobility: Secondary | ICD-10-CM

## 2020-06-10 DIAGNOSIS — M6281 Muscle weakness (generalized): Secondary | ICD-10-CM | POA: Diagnosis not present

## 2020-06-10 DIAGNOSIS — M25571 Pain in right ankle and joints of right foot: Secondary | ICD-10-CM | POA: Diagnosis not present

## 2020-06-10 DIAGNOSIS — R600 Localized edema: Secondary | ICD-10-CM

## 2020-06-10 NOTE — Patient Instructions (Signed)
Access Code: [email or web address] URL: [email or web address] Date: 06/10/2020 Prepared by: Faustino Congress  Exercises Squat - 1 x daily - 7 x weekly - 3 sets - 10 reps Side Stepping - 1 x daily - 7 x weekly - 3 sets - 10 reps Heel Toe Raises at [place] - 1 x daily - 7 x weekly - 3 sets - 10 reps Single Leg Stance at [place] - 1 x daily - 7 x weekly - 3 sets - 10 reps Flutter Kick at [employer] - 1 x daily - 7 x weekly - 3 sets - 10 reps Forward Backward Tandem Walking - 1 x daily - 7 x weekly - 3 sets - 10 reps [place] at [employer] - 1 x daily - 7 x weekly - 3 sets - 10 reps Warrior III in [employer] with [employer] - 1 x daily - 7 x weekly - 3 sets - 10 reps Tandem Balance on Noodle at [employer] - 1 x daily - 7 x weekly - 3 sets - 10 reps Forward Backward Toe Walk with Hand Floats - 1 x daily - 7 x weekly - 3 sets - 10 reps Light Skipping - 1 x daily - 7 x weekly - 3 sets - 10 reps

## 2020-06-10 NOTE — Therapy (Addendum)
[facility] [place], [place], [address] Phone: [phone]   Fax:  [phone]  Physical Therapy Treatment/Discharge Summary  Patient Details  Name: Julie Mann MRN: [id number] Date of Birth: 15-Dec-1945 Referring Provider (PT): Dr. Meridee Score   Encounter Date: 06/10/2020   PT End of Session - 06/10/20 1151    Visit Number 2    Number of Visits 6    Date for PT Re-Evaluation 07/01/20    Authorization Type AARP    Progress Note Due on Visit 10    PT Start Time 1101    PT Stop Time 1142    PT Time Calculation (min) 41 min    Activity Tolerance Patient tolerated treatment well    Behavior During Therapy [facility] for tasks assessed/performed           Past Medical History:  Diagnosis Date  . 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 Arthritis    "hands, fingers, back" (02/27/2014)  . Atrial fibrillation ([place])   . Cat scratch of right hand 02/24/2014  . Dysrhythmia    a fib  . Fall 01/2018  . Fall from horse 01/2018  . Fracture, ankle 02/2019   Right   . High cholesterol   . History of stomach ulcers ~ 1966  . Osteopenia     Past Surgical History:  Procedure Laterality Date  . ANUS SURGERY  2005   "biopsy"  . BREAST CYST EXCISION  1966  . DILATION AND CURETTAGE OF UTERUS  1980's   S/P miscarriage  . EXTERNAL FIXATION LEG Right 03/10/2019   Procedure: OPEN REDUCTION INTERNAL FIXATION RIGHT ANKLE;  Surgeon: Marchia Bond, MD;  Location: [facility];  Service: Orthopedics;  Laterality: Right;  Marland Kitchen VARICOSE VEIN SURGERY Left    left leg    There were no vitals filed for this visit.   Subjective Assessment - 06/10/20 1101    Subjective doing well, had injection and ankle is feeling better with that and exercises.  feels 40% better.    Limitations House hold activities;Standing;Walking    Patient Stated Goals update exercises, try to help with arthritis    Currently in Pain? No/denies    Pain Onset  More than a month ago              [facility] PT Assessment - 06/10/20 1137      Assessment   Medical Diagnosis Rt ankle pain    Referring Provider (PT) Dr. Meridee Score      Single Leg Stance   Comments RLE 13 sec                         [facility] Adult PT Treatment/Exercise - 06/10/20 1104      Exercises   Exercises Ankle      Ankle Exercises: Stretches   Soleus Stretch 3 reps;30 seconds    Gastroc Stretch 3 reps;30 seconds      Ankle Exercises: Aerobic   Recumbent Bike L3 x 6 min      Ankle Exercises: Standing   SLS on solid and compliant surface 3x15 sec, RLE    Other Standing Ankle Exercises tandem walk x 10'      Ankle Exercises: Seated   Other Seated Ankle Exercises 4-way with L3 band x 20 reps                       PT Long  Term Goals - 06/10/20 1152      PT LONG TERM GOAL #1   Title independent with HEP    Status Achieved      PT LONG TERM GOAL #2   Title improve SLS to >/= 10 sec on RLE for improved balance and strength    Status Achieved      PT LONG TERM GOAL #3   Title report 30% improvement in pain for improved function    Status Achieved      PT LONG TERM GOAL #4   Title n/a      PT LONG TERM GOAL #5   Title n/a                 Plan - 06/10/20 1152    Clinical Impression Statement Pt has demonstrated great progress with PT and new HEP.  She has met all goals and feels good with current HEP.  Provided pool exercises as she has a pool so these would be beneficial to do in the water.  Will hold PT and she will call if anything changes.  Will d/c if she doesn't return in 30 days.    Personal Factors and Comorbidities Comorbidity 2    Examination-Activity Limitations Locomotion Level;Stand;Squat    Examination-Participation Restrictions Community Activity;Yard Work;Meal Prep    Stability/Clinical Decision Making Stable/Uncomplicated    Rehab Potential Poor    PT Frequency 1x / week   1x/w PRN   PT Duration 6 weeks      PT Treatment/Interventions ADLs/Self Care Home Management;Electrical Stimulation;Ultrasound;Traction;Moist Heat;Iontophoresis '4mg'$ /ml Dexamethasone;Gait training;Stair training;Functional mobility training;Therapeutic activities;Therapeutic exercise;Balance training;Neuromuscular re-education;Manual techniques;Patient/family education;Passive range of motion;Dry needling;Joint Manipulations;Spinal Manipulations;Vasopneumatic Device;Taping    PT Next Visit Plan hold x 30 days, d/c if pt doesn't return, otherwise reassess    PT Home Exercise Plan Access Code: [email or web address]    Consulted and Agree with Plan of Care Patient           Patient will benefit from skilled therapeutic intervention in order to improve the following deficits and impairments:  Decreased coordination, Decreased range of motion, Decreased activity tolerance, Pain, Hypomobility, Decreased mobility, Decreased strength, Increased fascial restricitons, Increased muscle spasms, Decreased balance, Difficulty walking, Impaired flexibility, Increased edema, Abnormal gait  Visit Diagnosis: Pain in right ankle and joints of right foot  Stiffness of right ankle, not elsewhere classified  Muscle weakness (generalized)  Other abnormalities of gait and mobility  Localized edema     Problem List Patient Active Problem List   Diagnosis Date Noted  . Biceps tendonosis of left shoulder 02/13/2020  . Impingement syndrome of left shoulder 02/13/2020  . Post-operative state 02/13/2020  . Ankle dislocation, right, initial encounter 03/10/2019  . Trimalleolar fracture of ankle, closed, right, initial encounter   . Chronic left shoulder pain 02/19/2019  . Anxiety 12/17/2016  . 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[place]) 12/17/2016  . Osteopenia 12/17/2016  . Paroxysmal atrial fibrillation ([place]) 12/17/2016  . Pure hypercholesterolemia 12/17/2016  . Rectal cancer ([place]) 07/30/2015      Laureen Abrahams, PT, DPT 06/10/20 11:55  AM     [facility] [address] [place], [place], [address] Phone: [phone]   Fax:  [phone]  Name: Julie Mann MRN: [id number] Date of Birth: Mar 11, 1946      PHYSICAL THERAPY DISCHARGE SUMMARY  Visits from Start of Care: 2  Current functional level related to goals / functional outcomes: See above   Remaining deficits: See above   Education / Equipment:  HEP  Plan: Patient agrees to discharge.  Patient goals were met. Patient is being discharged due to meeting the stated rehab goals.  ?????    Laureen Abrahams, PT, DPT 08/31/20 11:25 AM  [facility] [address] [place], [place], [address] Phone: [phone]   Fax:  [phone]

## 2020-06-23 ENCOUNTER — Other Ambulatory Visit: Payer: Self-pay | Admitting: Geriatric Medicine

## 2020-06-23 ENCOUNTER — Other Ambulatory Visit: Payer: Self-pay | Admitting: Obstetrics & Gynecology

## 2020-06-23 DIAGNOSIS — R911 Solitary pulmonary nodule: Secondary | ICD-10-CM

## 2020-06-23 DIAGNOSIS — Z1231 Encounter for screening mammogram for malignant neoplasm of breast: Secondary | ICD-10-CM

## 2020-07-01 ENCOUNTER — Ambulatory Visit
Admission: RE | Admit: 2020-07-01 | Discharge: 2020-07-01 | Disposition: A | Discharge status: Home / Self Care | Payer: Medicare Other | Source: Ambulatory Visit | Attending: Geriatric Medicine | Admitting: Geriatric Medicine

## 2020-07-01 DIAGNOSIS — R911 Solitary pulmonary nodule: Secondary | ICD-10-CM

## 2020-07-09 ENCOUNTER — Other Ambulatory Visit (HOSPITAL_COMMUNITY): Payer: Self-pay | Admitting: Geriatric Medicine

## 2020-07-16 ENCOUNTER — Telehealth: Payer: Self-pay | Admitting: Orthopedic Surgery

## 2020-07-16 ENCOUNTER — Ambulatory Visit: Payer: Medicare Other | Admitting: Orthopaedic Surgery

## 2020-07-16 ENCOUNTER — Other Ambulatory Visit: Payer: Self-pay

## 2020-07-16 ENCOUNTER — Encounter: Payer: Self-pay | Admitting: Orthopaedic Surgery

## 2020-07-16 VITALS — Ht 67.0 in | Wt 137.0 lb

## 2020-07-16 DIAGNOSIS — M25512 Pain in left shoulder: Secondary | ICD-10-CM

## 2020-07-16 DIAGNOSIS — M7542 Impingement syndrome of left shoulder: Secondary | ICD-10-CM

## 2020-07-16 DIAGNOSIS — G8929 Other chronic pain: Secondary | ICD-10-CM | POA: Diagnosis not present

## 2020-07-16 MED ORDER — METHYLPREDNISOLONE ACETATE 40 MG/ML IJ SUSP
80.0000 mg | INTRAMUSCULAR | Status: AC | PRN
  Administered 2020-07-16: 80 mg via INTRA_ARTICULAR

## 2020-07-16 MED ORDER — BUPIVACAINE HCL 0.5 % IJ SOLN
2.0000 mL | INTRAMUSCULAR | Status: AC | PRN
  Administered 2020-07-16: 2 mL via INTRA_ARTICULAR

## 2020-07-16 MED ORDER — LIDOCAINE HCL 2 % IJ SOLN
2.0000 mL | INTRAMUSCULAR | Status: AC | PRN
  Administered 2020-07-16: 2 mL

## 2020-07-16 NOTE — Progress Notes (Signed)
Office Visit Note   Patient: Julie Mann           Date of Birth: [birth year]           MRN: [id number] Visit Date: 07/16/2020              Requested by: Lajean Manes, MD [address] [place],  [place] [address] PCP: Lajean Manes, MD   Assessment & Plan: Visit Diagnoses:  1. Chronic left shoulder pain   2. Impingement syndrome of left shoulder     Plan: 53-month status post arthroscopic SCD DCR left shoulder with an open mini biceps tenodesis.  Did very well up until about 2 weeks ago when she began to experience some lateral subacromial pain working with her abduction exercises.  She does have some compromise of her activities.  She did not really have impingement but with abduction she did have subacromial pain so I am going to inject that with cortisone and monitor response.  She had good range of motion and no pain referable to the glenohumeral joint where she did have some chondromalacia noted at the time of surgery  Follow-Up Instructions: Return if symptoms worsen or fail to improve.   Orders:  Orders Placed This Encounter  Procedures  . Large Joint Inj: L subacromial bursa   No orders of the defined types were placed in this encounter.     Procedures: Large Joint Inj: L subacromial bursa on 07/16/2020 1:16 PM Indications: pain and diagnostic evaluation Details: 25 G 1.5 in needle, anterolateral approach  Arthrogram: No  Medications: 2 mL lidocaine 2 %; 2 mL bupivacaine 0.5 %; 80 mg methylPREDNISolone acetate 40 MG/ML Consent was given by the patient. Immediately prior to procedure a time out was called to verify the correct patient, procedure, equipment, support staff and site/side marked as required. Patient was prepped and draped in the usual sterile fashion.       Clinical Data: No additional findings.   Subjective: Chief Complaint  Patient presents with  . Left Shoulder - Pain    Left shoulder scope 02/06/2020  Patient presents today  for left shoulder pain. She had a left shoulder arthroscopy on 02/06/2020. She states that her left shoulder has been hurting for a couple weeks. She has been doing home exercises and noticed that one exercise that involves lifting her arm out beside her while holding a can causes pain. She stopped that exercise a couple days ago and has noticed some improvement.  No numbness or tingling or neck pain has not lost any motion  HPI  Review of Systems   Objective: Vital Signs: Ht 5\' 7"  (1.702 m)   Wt 137 lb (62.1 kg)   LMP 11/14/1992   BMI 21.46 kg/m   Physical Exam Constitutional:      Appearance: She is well-developed.  Eyes:     Pupils: Pupils are equal, round, and reactive to light.  Pulmonary:     Effort: Pulmonary effort is normal.  Skin:    General: Skin is warm and dry.  Neurological:     Mental Status: She is alert and oriented to person, place, and time.  Psychiatric:        Behavior: Behavior normal.     Ortho Exam left shoulder with full overhead flexion.  Slight loss of internal rotation but is able to touch the middle of her back.  Had slightly decreased external rotation compared to the right but not a functional loss.  Negative  empty can and impingement testing but with abduction and with external rotation had a little discomfort in the anterior and lateral subacromial region.  No crepitation.  No pain at the [facility] joint.  No pain along the biceps muscle or tendon which was tenodesed Specialty Comments:  No specialty comments available.  Imaging: No results found.   PMFS History: Patient Active Problem List   Diagnosis Date Noted  . Biceps tendonosis of left shoulder 02/13/2020  . Impingement syndrome of left shoulder 02/13/2020  . Post-operative state 02/13/2020  . Ankle dislocation, right, initial encounter 03/10/2019  . Trimalleolar fracture of ankle, closed, right, initial encounter   . Chronic left shoulder pain 02/19/2019  . Anxiety 12/17/2016  . Major  depression in remission ([place]) 12/17/2016  . Osteopenia 12/17/2016  . Paroxysmal atrial fibrillation ([place]) 12/17/2016  . Pure hypercholesterolemia 12/17/2016  . Rectal cancer ([place]) 07/30/2015   Past Medical History:  Diagnosis Date  . 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 Arthritis    "hands, fingers, back" (02/27/2014)  . Atrial fibrillation ([place])   . Cat scratch of right hand 02/24/2014  . Dysrhythmia    a fib  . Fall 01/2018  . Fall from horse 01/2018  . Fracture, ankle 02/2019   Right   . High cholesterol   . History of stomach ulcers ~ 1966  . Osteopenia     Family History  Problem Relation Age of Onset  . Breast cancer Maternal Aunt   . Breast cancer Maternal Grandmother   . Heart disease Mother   . Stroke Father   . Rectal cancer Paternal Grandmother   . Breast cancer Sister 3  . Breast cancer Sister 29    Past Surgical History:  Procedure Laterality Date  . ANUS SURGERY  2005   "biopsy"  . BREAST CYST EXCISION  1966  . DILATION AND CURETTAGE OF UTERUS  1980's   S/P miscarriage  . EXTERNAL FIXATION LEG Right 03/10/2019   Procedure: OPEN REDUCTION INTERNAL FIXATION RIGHT ANKLE;  Surgeon: Marchia Bond, MD;  Location: [facility];  Service: Orthopedics;  Laterality: Right;  Marland Kitchen VARICOSE VEIN SURGERY Left    left leg   Social History   Occupational History  . Not on file  Tobacco Use  . Smoking status: Former Smoker    Packs/day: 1.00    Years: 30.00    Pack years: 30.00    Types: Cigarettes  . Smokeless tobacco: Never Used  Vaping Use  . Vaping Use: Never used  Substance and Sexual Activity  . Alcohol use: No  . Drug use: No  . Sexual activity: Yes    Partners: Male    Birth control/protection: Post-menopausal

## 2020-07-16 NOTE — Telephone Encounter (Signed)
I faxed updated billing statements to Arby Barrette w/ [employer] that reflects patient not being seen 05/18/2019, that injection was given on 05/14/2019 and also faxed 10/31/2019 ov note fax [phone]

## 2020-07-22 ENCOUNTER — Other Ambulatory Visit: Payer: Self-pay | Admitting: *Deleted

## 2020-07-22 DIAGNOSIS — R911 Solitary pulmonary nodule: Secondary | ICD-10-CM

## 2020-07-28 ENCOUNTER — Other Ambulatory Visit: Payer: Self-pay

## 2020-07-28 ENCOUNTER — Ambulatory Visit: Payer: Medicare Other | Admitting: Orthopaedic Surgery

## 2020-07-28 ENCOUNTER — Ambulatory Visit (INDEPENDENT_AMBULATORY_CARE_PROVIDER_SITE_OTHER): Payer: Medicare Other | Admitting: Internal Medicine

## 2020-07-28 DIAGNOSIS — R911 Solitary pulmonary nodule: Secondary | ICD-10-CM | POA: Diagnosis not present

## 2020-07-28 LAB — PULMONARY FUNCTION TEST
DL/VA % pred: 78 %
DL/VA: 3.15 ml/min/mmHg/L
DLCO cor % pred: 73 %
DLCO cor: 15.41 ml/min/mmHg
DLCO unc % pred: 73 %
DLCO unc: 15.41 ml/min/mmHg
FEF 25-75 Post: 1.11 L/sec
FEF 25-75 Pre: 0.8 L/sec
FEF2575-%Change-Post: 39 %
FEF2575-%Pred-Post: 62 %
FEF2575-%Pred-Pre: 44 %
FEV1-%Change-Post: 14 %
FEV1-%Pred-Post: 76 %
FEV1-%Pred-Pre: 66 %
FEV1-Post: 1.81 L
FEV1-Pre: 1.58 L
FEV1FVC-%Change-Post: 6 %
FEV1FVC-%Pred-Pre: 77 %
FEV6-%Change-Post: 9 %
FEV6-%Pred-Post: 97 %
FEV6-%Pred-Pre: 88 %
FEV6-Post: 2.91 L
FEV6-Pre: 2.66 L
FEV6FVC-%Change-Post: 1 %
FEV6FVC-%Pred-Post: 104 %
FEV6FVC-%Pred-Pre: 102 %
FVC-%Change-Post: 7 %
FVC-%Pred-Post: 93 %
FVC-%Pred-Pre: 86 %
FVC-Post: 2.93 L
FVC-Pre: 2.72 L
Post FEV1/FVC ratio: 62 %
Post FEV6/FVC ratio: 99 %
Pre FEV1/FVC ratio: 58 %
Pre FEV6/FVC Ratio: 98 %
RV % pred: 139 %
RV: 3.44 L
TLC % pred: 115 %
TLC: 6.35 L

## 2020-07-28 NOTE — Progress Notes (Signed)
Full PFT performed today. °

## 2020-07-29 ENCOUNTER — Encounter (HOSPITAL_COMMUNITY)
Admission: RE | Admit: 2020-07-29 | Discharge: 2020-07-29 | Disposition: A | Discharge status: Home / Self Care | Payer: Medicare Other | Source: Ambulatory Visit | Attending: Thoracic Surgery (Cardiothoracic Vascular Surgery) | Admitting: Thoracic Surgery (Cardiothoracic Vascular Surgery)

## 2020-07-29 DIAGNOSIS — I700 Atherosclerosis of aorta: Secondary | ICD-10-CM | POA: Diagnosis not present

## 2020-07-29 DIAGNOSIS — J439 Emphysema, unspecified: Secondary | ICD-10-CM | POA: Insufficient documentation

## 2020-07-29 DIAGNOSIS — I2510 Atherosclerotic heart disease of native coronary artery without angina pectoris: Secondary | ICD-10-CM | POA: Diagnosis not present

## 2020-07-29 DIAGNOSIS — R911 Solitary pulmonary nodule: Secondary | ICD-10-CM | POA: Diagnosis not present

## 2020-07-29 LAB — GLUCOSE, CAPILLARY: Glucose-Capillary: 97 mg/dL (ref 70–99)

## 2020-07-29 MED ORDER — FLUDEOXYGLUCOSE F - 18 (FDG) INJECTION
6.8000 | Freq: Once | INTRAVENOUS | Status: AC | PRN
  Administered 2020-07-29: 6.8 via INTRAVENOUS

## 2020-07-30 ENCOUNTER — Other Ambulatory Visit: Payer: Self-pay | Admitting: Obstetrics & Gynecology

## 2020-07-30 DIAGNOSIS — F419 Anxiety disorder, unspecified: Secondary | ICD-10-CM

## 2020-07-30 NOTE — Telephone Encounter (Signed)
Medication refill request: Lorazepam 1mg   Last AEX:  07/25/19 Next AEX: 09/18/20 Last MMG (if hormonal medication request): NA Refill authorized: 30/0

## 2020-07-31 ENCOUNTER — Other Ambulatory Visit: Payer: Self-pay

## 2020-07-31 ENCOUNTER — Encounter: Payer: Self-pay | Admitting: Thoracic Surgery (Cardiothoracic Vascular Surgery)

## 2020-07-31 ENCOUNTER — Institutional Professional Consult (permissible substitution): Payer: Medicare Other | Admitting: Thoracic Surgery (Cardiothoracic Vascular Surgery)

## 2020-07-31 VITALS — BP 180/80 | HR 86 | Temp 97.6°F | Resp 20 | Ht 67.0 in | Wt 132.0 lb

## 2020-07-31 DIAGNOSIS — R911 Solitary pulmonary nodule: Secondary | ICD-10-CM | POA: Diagnosis not present

## 2020-07-31 NOTE — Progress Notes (Signed)
[address]       [place], [address]             [phone]                    Kailin W Daoud [facility] Medical Record #[id number] Date of Birth: 09-22-1946  Referring: Lajean Manes, MD Primary Care: Lajean Manes, MD Primary Cardiologist: Sinclair Grooms, MD  Chief Complaint:    Chief Complaint  Patient presents with  . Lung Lesion    Surgical consult PET Scan 07/29/20, Chest CT 07/01/20, PFT's 07/28/20    History of Present Illness:    Julie Mann 74 y.o. female presents for surgical evaluation of a subcentimeter right upper lobe pulmonary nodule.  She does have a history of anal cancer that was treated 16 years ago and has had subsequent surveillance chest CTs.    She denies any symptoms from a respiratory standpoint.  Her weight has been stable.  She denies any neurologic symptoms.  She recently underwent shoulder surgery and only complains of some pain there.  She also had a fall within the last year and fractured her right ankle.  Smoking Hx: Quit cigarettes 16 years ago when she was diagnosed with anal cancer.  Previously she was smoking a pack a day.   Zubrod Score: At the time of surgery this patient's most appropriate activity status/level should be described as: [x]     0    Normal activity, no symptoms []     1    Restricted in physical strenuous activity but ambulatory, able to do out light work []     2    Ambulatory and capable of self care, unable to do work activities, up and about               >50 % of waking hours                              []     3    Only limited self care, in bed greater than 50% of waking hours []     4    Completely disabled, no self care, confined to bed or chair []     5    Moribund   Past Medical History:  Diagnosis Date  . 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 Arthritis    "hands, fingers, back" (02/27/2014)  . Atrial fibrillation ([place])   . Cat scratch of right hand  02/24/2014  . Dysrhythmia    a fib  . Fall 01/2018  . Fall from horse 01/2018  . Fracture, ankle 02/2019   Right   . High cholesterol   . History of stomach ulcers ~ 1966  . Osteopenia     Past Surgical History:  Procedure Laterality Date  . ANUS SURGERY  2005   "biopsy"  . BREAST CYST EXCISION  1966  . DILATION AND CURETTAGE OF UTERUS  1980's   S/P miscarriage  . EXTERNAL FIXATION LEG Right 03/10/2019   Procedure: OPEN REDUCTION INTERNAL FIXATION RIGHT ANKLE;  Surgeon: Marchia Bond, MD;  Location: [facility];  Service: Orthopedics;  Laterality: Right;  Marland Kitchen VARICOSE VEIN SURGERY Left    left leg    Family History  Problem Relation Age of Onset  . Breast cancer Maternal Aunt   . Breast cancer Maternal Grandmother   . Heart  disease Mother   . Stroke Father   . Rectal cancer Paternal Grandmother   . Breast cancer Sister 24  . Breast cancer Sister 35     Social History   Tobacco Use  Smoking Status Former Smoker  . Packs/day: 1.00  . Years: 30.00  . Pack years: 30.00  . Types: Cigarettes  Smokeless Tobacco Never Used    Social History   Substance and Sexual Activity  Alcohol Use No     Allergies  Allergen Reactions  . Eliquis [Apixaban] Anaphylaxis    Made her feel sick  . Ivp Dye [Iodinated Diagnostic Agents] Swelling  . Doxycycline Other (See Comments)    Dizziness and tingling  . Erythromycin Other (See Comments)    Hallucinations   . Flonase [Fluticasone Propionate]   . Keflex [Cephalexin] Other (See Comments)    Hallucinations.  . Paxil [Paroxetine Hcl] Diarrhea  . Premarin [Conjugated Estrogens]     Felt weird  . Provera [Medroxyprogesterone Acetate]     Patient do not remember reaction  . Xarelto [Rivaroxaban]     Made her feel sick  . Zithromax [Azithromycin] Other (See Comments)    hallucinations  . Zoloft [Sertraline Hcl] Other (See Comments)    sedation    Current Outpatient Medications  Medication Sig Dispense Refill  . aspirin EC 81  MG tablet Take 1 tablet (81 mg total) by mouth daily. 90 tablet 3  . atorvastatin (LIPITOR) 10 MG tablet Take 10 mg by mouth 2 (two) times a week. Tuesday and Friday  12  . ibuprofen (ADVIL) 600 MG tablet TAKE 1 TABLET BY MOUTH EVERY 8 HOURS AS NEEDED FOR HEADACHE OR MILD PAIN 30 tablet 1  . LORazepam (ATIVAN) 1 MG tablet TAKE 1/2-1 TABLET BY MOUTH NO MORE THAN ONCE DAILY AS NEEDED FOR ANXIETY 30 tablet 0  . metoprolol tartrate (LOPRESSOR) 25 MG tablet Take 25 mg by mouth daily as needed (palpitations).     . Multiple Vitamins-Minerals (MULTIVITAMIN PO) Take 1 tablet by mouth daily.    . Omega-3 Fatty Acids (FISH OIL PO) Take 5 mLs by mouth daily.    Marland Kitchen omeprazole (PRILOSEC) 20 MG capsule Take 20 mg by mouth daily.    Marland Kitchen OVER THE COUNTER MEDICATION Take 5 mLs by mouth daily. 1 teaspoon of vinegar    . OVER THE COUNTER MEDICATION Take 15 mLs by mouth 3 (three) times daily with meals. Liquid Acidophilus    . Propylene Glycol-Glycerin (SOOTHE) 0.6-0.6 % SOLN Place 1 drop into both eyes daily as needed (pollen allergies).    Marland Kitchen oxyCODONE (ROXICODONE) 5 MG immediate release tablet Take 1 tablet (5 mg total) by mouth every 6 (six) hours as needed. (Patient not taking: Reported on 07/31/2020) 30 tablet 0   No current facility-administered medications for this visit.    Review of Systems  Constitutional: Negative.   Respiratory: Negative for cough and shortness of breath.   Cardiovascular: Negative for chest pain.  Musculoskeletal: Positive for joint pain and myalgias.  Neurological: Negative.      PHYSICAL EXAMINATION: BP (!) 180/80   Pulse 86   Temp 97.6 F (36.4 C) (Skin)   Resp 20   Ht 5\' 7"  (1.702 m)   Wt 132 lb (59.9 kg)   LMP 11/14/1992   SpO2 99% Comment: RA  BMI 20.67 kg/m  Physical Exam Constitutional:      Appearance: Normal appearance.  HENT:     Head: Normocephalic and atraumatic.  Eyes:  Extraocular Movements: Extraocular movements intact.     Conjunctiva/sclera:  Conjunctivae normal.  Cardiovascular:     Rate and Rhythm: Normal rate and regular rhythm.     Heart sounds: Normal heart sounds.  Pulmonary:     Effort: Pulmonary effort is normal. No respiratory distress.     Breath sounds: Normal breath sounds.  Skin:    General: Skin is warm and dry.  Neurological:     General: No focal deficit present.     Mental Status: She is alert and oriented to person, place, and time.     Diagnostic Studies & Laboratory data:     Recent Radiology Findings:   NM PET Image Initial (PI) Skull Base To Thigh  Result Date: 07/29/2020 CLINICAL DATA:  Initial treatment strategy for history of anal cancer with enlarging right upper lobe lung nodule. EXAM: NUCLEAR MEDICINE PET SKULL BASE TO THIGH TECHNIQUE: 6.8 mCi F-18 FDG was injected intravenously. Full-ring PET imaging was performed from the skull base to thigh after the radiotracer. CT data was obtained and used for attenuation correction and anatomic localization. Fasting blood glucose: 97 mg/dl COMPARISON:  Chest CT 07/01/2020.  Abdominopelvic CT 02/05/2018. FINDINGS: Mediastinal blood pool activity: SUV max 2.9 Liver activity: SUV max NA NECK: [place] tonsil hypermetabolism is symmetric, favored to be physiologic. No cervical nodal hypermetabolism. Incidental CT findings: Bilateral carotid atherosclerosis. CHEST: No correlate hypermetabolism to correspond to the pulmonary nodule described on the 07/01/2020 CT. This is below PET resolution, on the order of 7 mm on 59/4. No thoracic nodal hypermetabolism. Incidental CT findings: Emphysema. Scattered other pulmonary nodules as were detailed previously. All below PET resolution. Example left lower lobe 6 mm nodule on 96/4. Aortic and coronary artery atherosclerosis. ABDOMEN/PELVIS: No abdominopelvic parenchymal or nodal hypermetabolism. Incidental CT findings: Normal adrenal glands. Abdominal aortic atherosclerosis. Left hepatic calcification is likely post infectious.  Normal adrenal glands. Periampullary duodenal diverticulum or diverticula. Moderate pelvic floor laxity. SKELETON: Presumably degenerative hypermetabolism about the left shoulder. No suspicious osseous hypermetabolism. Incidental CT findings: none IMPRESSION: 1. The 7 mm right upper lobe pulmonary nodule described on the prior chest CT is not significantly hypermetabolic, but is below PET resolution. Therefore, findings are indeterminate and follow-up chest CT at 6 months recommended. 2. Incidental findings, including: Aortic atherosclerosis (ICD10-I70.0), coronary artery atherosclerosis and emphysema (ICD10-J43.9). Electronically Signed   By: Abigail Miyamoto M.D.   On: 07/29/2020 16:37       I have independently reviewed the above radiology studies  and reviewed the findings with the patient.   Recent Lab Findings: Lab Results  Component Value Date   WBC 13.1 (H) 03/10/2019   HGB 12.8 03/10/2019   HCT 40.5 03/10/2019   PLT 287 03/10/2019   GLUCOSE 117 (H) 03/10/2019   ALT 8 03/24/2009   AST 17 03/24/2009   NA 139 03/10/2019   K 3.8 03/10/2019   CL 106 03/10/2019   CREATININE 0.87 03/10/2019   BUN 17 03/10/2019   CO2 22 03/10/2019   TSH 7.475 (H) 10/24/2016   INR 0.91 02/05/2018     PFTs: - FVC: 86% - FEV1: 66% -DLCO: 73%  Problem List: 7 mm spiculated right upper lobe pulmonary nodule. 6 mm smooth left lower lobe pulmonary nodule History of paroxysmal atrial fibrillation only being treated with metoprolol.  Regular rate and rhythm today in clinic.   Assessment / Plan:   74 year old female with a spiculated right upper lobe pulmonary nodule and a remote history of smoking.  Given its  appearance I am concerned that this could be a primary lung cancer and have recommended that she undergo navigational bronchoscopy with biopsy with Dr. Valeta Harms.  Despite the fact that her PET did not show much avidity I think that given the size that is likely falsely negative.  I will discuss his case  with Dr. Valeta Harms to see about scheduling for navigational bronchoscopy.  If the nerve bronc is negative then I think that we could potentially repeat her scan in 6 months.  If this continues to grow and she would need a surgical resection.     I  spent 40 minutes with  the patient face to face and greater then 50% of the time was spent in counseling and coordination of care.    Lajuana Matte 07/31/2020 2:07 PM

## 2020-08-05 ENCOUNTER — Telehealth: Payer: Self-pay | Admitting: Interventional Cardiology

## 2020-08-05 NOTE — Telephone Encounter (Signed)
Will route to Dr. Tamala Julian to review.    Dr. Kipp Brood is not asking for clearance.  Pt just wanted to be sure you were ok with her having the surgery.

## 2020-08-05 NOTE — Telephone Encounter (Signed)
Patient states she has a nodule in her lung that needs to be removed. She states the office doing the procedure is not requiring cardiac clearance, but she would like to know if Dr. Tamala Julian thinks it's okay.

## 2020-08-05 NOTE — Telephone Encounter (Signed)
She has not been seen recently, however, it no chest pain, swelling, dyspnea, or other complaints, okay to proceed.

## 2020-08-06 NOTE — Telephone Encounter (Signed)
Spoke with pt and made her aware of information from Dr. Tamala Julian.  Pt is not having issues.  Pt appreciative for call.

## 2020-08-10 ENCOUNTER — Ambulatory Visit
Admission: RE | Admit: 2020-08-10 | Discharge: 2020-08-10 | Disposition: A | Discharge status: Home / Self Care | Payer: Medicare Other | Source: Ambulatory Visit | Attending: Obstetrics & Gynecology | Admitting: Obstetrics & Gynecology

## 2020-08-10 ENCOUNTER — Other Ambulatory Visit: Payer: Self-pay

## 2020-08-10 DIAGNOSIS — Z1231 Encounter for screening mammogram for malignant neoplasm of breast: Secondary | ICD-10-CM

## 2020-08-17 ENCOUNTER — Telehealth: Payer: Self-pay | Admitting: Pulmonary Disease

## 2020-08-17 ENCOUNTER — Other Ambulatory Visit: Payer: Self-pay

## 2020-08-17 ENCOUNTER — Ambulatory Visit (INDEPENDENT_AMBULATORY_CARE_PROVIDER_SITE_OTHER): Payer: Medicare Other | Admitting: Pulmonary Disease

## 2020-08-17 ENCOUNTER — Encounter: Payer: Self-pay | Admitting: Pulmonary Disease

## 2020-08-17 VITALS — BP 164/80 | HR 80 | Temp 96.9°F | Ht 67.0 in | Wt 135.8 lb

## 2020-08-17 DIAGNOSIS — R911 Solitary pulmonary nodule: Secondary | ICD-10-CM | POA: Diagnosis present

## 2020-08-17 DIAGNOSIS — J449 Chronic obstructive pulmonary disease, unspecified: Secondary | ICD-10-CM | POA: Diagnosis not present

## 2020-08-17 DIAGNOSIS — J432 Centrilobular emphysema: Secondary | ICD-10-CM

## 2020-08-17 DIAGNOSIS — Z23 Encounter for immunization: Secondary | ICD-10-CM

## 2020-08-17 MED ORDER — SPIRIVA RESPIMAT 2.5 MCG/ACT IN AERS: 2.0000 | INHALATION_SPRAY | Freq: Every day | RESPIRATORY_TRACT | 0 refills | Status: DC

## 2020-08-17 MED ORDER — SPIRIVA RESPIMAT 2.5 MCG/ACT IN AERS: 2.0000 | INHALATION_SPRAY | Freq: Every day | RESPIRATORY_TRACT | 3 refills | Status: DC

## 2020-08-17 MED ORDER — ALBUTEROL SULFATE HFA 108 (90 BASE) MCG/ACT IN AERS: 2.0000 | INHALATION_SPRAY | Freq: Four times a day (QID) | RESPIRATORY_TRACT | 6 refills | Status: DC | PRN

## 2020-08-17 NOTE — Addendum Note (Signed)
Addended by: Merrilee Seashore on: 08/17/2020 12:20 PM   Modules accepted: Orders

## 2020-08-17 NOTE — Progress Notes (Signed)
Synopsis: Referred in October 2021 for lung nodule by Lajean Manes, MD.  Referral from Dr. Kipp Brood.  Subjective:   PATIENT ID: Julie Mann GENDER: female DOB: 01/12/1946, MRN: [id number]  Chief Complaint  Patient presents with  . Consult    shortness of breath walking up hills and stairs    This is a 74 year old female, history of anal cancer status post chemo plus radiation, atrial fibrillation on aspirin 81 mg daily.  Patient is a former smoker, 30-pack-year history.  CT imaging of the chest was completed in August 2021 by primary care which revealed a interval increase in a subcentimeter pulmonary nodule now 7 mm in size.  It has slowly been growing since 2019.  Images reviewed from 2019 at 5 mm, August 2020 at 6 mm and now August 2021 nodules at 7 mm.  She does have associated upper lobe predominant emphysema.  She is currently relatively asymptomatic except for dyspnea on exertion.  She did have pulmonary function test completed recently which revealed an FEV1 postbronchodilator of 76% consistent with stage II COPD.  She does notice that her dyspnea is worse with climbing stairs.  She does feel out of breath and has to stop which has changed some and likely gotten worse over the past 6 months.  Rarely has any sputum production.  She does not use any maintenance inhalers.  She does not have an albuterol inhaler.  She did have anal cancer and tolerated the radiation well.  She said she did not do well with the chemo treatments.  She is very anxious about having a large surgery for a lung nodule.  If she could have radiation treatments she would prefer this.  Today I discussed the risk benefits and alternatives of proceeding with navigational bronchoscopy.   Past Medical History:  Diagnosis Date  . Anal cancer ([place]) 2005   SCCa anus-stage II chemo, radiation  . Arthritis    "hands, fingers, back" (02/27/2014)  . Atrial fibrillation ([place])   . Cat scratch of right hand 02/24/2014  .  Dysrhythmia    a fib  . Fall 01/2018  . Fall from horse 01/2018  . Fracture, ankle 02/2019   Right   . High cholesterol   . History of stomach ulcers ~ 1966  . Osteopenia      Family History  Problem Relation Age of Onset  . Breast cancer Maternal Aunt   . Breast cancer Maternal Grandmother   . Heart disease Mother   . Stroke Father   . Rectal cancer Paternal Grandmother   . Breast cancer Sister 45  . Breast cancer Sister 21     Past Surgical History:  Procedure Laterality Date  . ANUS SURGERY  2005   "biopsy"  . BREAST CYST EXCISION  1966  . DILATION AND CURETTAGE OF UTERUS  1980's   S/P miscarriage  . EXTERNAL FIXATION LEG Right 03/10/2019   Procedure: OPEN REDUCTION INTERNAL FIXATION RIGHT ANKLE;  Surgeon: Marchia Bond, MD;  Location: [facility];  Service: Orthopedics;  Laterality: Right;  Marland Kitchen VARICOSE VEIN SURGERY Left    left leg    Social History   Socioeconomic History  . Marital status: Married    Spouse name: Not on file  . Number of children: Not on file  . Years of education: Not on file  . Highest education level: Not on file  Occupational History  . Not on file  Tobacco Use  . Smoking status: Former Smoker    Packs/day:  1.00    Years: 30.00    Pack years: 30.00    Types: Cigarettes    Quit date: 2005    Years since quitting: 16.7  . Smokeless tobacco: Never Used  Vaping Use  . Vaping Use: Never used  Substance and Sexual Activity  . Alcohol use: No  . Drug use: No  . Sexual activity: Yes    Partners: Male    Birth control/protection: Post-menopausal  Other Topics Concern  . Not on file  Social History Narrative  . Not on file   Social Determinants of Health   Financial Resource Strain:   . Difficulty of Paying Living Expenses: Not on file  Food Insecurity:   . Worried About Charity fundraiser in the Last Year: Not on file  . Ran Out of Food in the Last Year: Not on file  Transportation Needs:   . Lack of Transportation (Medical): Not  on file  . Lack of Transportation (Non-Medical): Not on file  Physical Activity:   . Days of Exercise per Week: Not on file  . Minutes of Exercise per Session: Not on file  Stress:   . Feeling of Stress : Not on file  Social Connections:   . Frequency of Communication with Friends and Family: Not on file  . Frequency of Social Gatherings with Friends and Family: Not on file  . Attends Religious Services: Not on file  . Active Member of Clubs or Organizations: Not on file  . Attends Archivist Meetings: Not on file  . Marital Status: Not on file  Intimate Partner Violence:   . Fear of Current or Ex-Partner: Not on file  . Emotionally Abused: Not on file  . Physically Abused: Not on file  . Sexually Abused: Not on file     Allergies  Allergen Reactions  . Eliquis [Apixaban] Anaphylaxis    Made her feel sick  . Ivp Dye [Iodinated Diagnostic Agents] Swelling  . Doxycycline Other (See Comments)    Dizziness and tingling  . Erythromycin Other (See Comments)    Hallucinations   . Flonase [Fluticasone Propionate]   . Keflex [Cephalexin] Other (See Comments)    Hallucinations.  . Paxil [Paroxetine Hcl] Diarrhea  . Premarin [Conjugated Estrogens]     Felt weird  . Provera [Medroxyprogesterone Acetate]     Patient do not remember reaction  . Xarelto [Rivaroxaban]     Made her feel sick  . Zithromax [Azithromycin] Other (See Comments)    hallucinations  . Zoloft [Sertraline Hcl] Other (See Comments)    sedation     Outpatient Medications Prior to Visit  Medication Sig Dispense Refill  . aspirin EC 81 MG tablet Take 1 tablet (81 mg total) by mouth daily. 90 tablet 3  . atorvastatin (LIPITOR) 10 MG tablet Take 10 mg by mouth 2 (two) times a week. Tuesday and Friday  12  . ibuprofen (ADVIL) 600 MG tablet TAKE 1 TABLET BY MOUTH EVERY 8 HOURS AS NEEDED FOR HEADACHE OR MILD PAIN 30 tablet 1  . LORazepam (ATIVAN) 1 MG tablet TAKE 1/2-1 TABLET BY MOUTH NO MORE THAN ONCE  DAILY AS NEEDED FOR ANXIETY 30 tablet 0  . metoprolol tartrate (LOPRESSOR) 25 MG tablet Take 25 mg by mouth daily as needed (palpitations).     . Multiple Vitamins-Minerals (MULTIVITAMIN PO) Take 1 tablet by mouth daily.    . Omega-3 Fatty Acids (FISH OIL PO) Take 5 mLs by mouth daily.    Marland Kitchen omeprazole (  PRILOSEC) 20 MG capsule Take 20 mg by mouth daily as needed.     Marland Kitchen OVER THE COUNTER MEDICATION Take 5 mLs by mouth daily. 1 teaspoon of vinegar    . OVER THE COUNTER MEDICATION Take 15 mLs by mouth 3 (three) times daily with meals. Liquid Acidophilus    . Propylene Glycol-Glycerin (SOOTHE) 0.6-0.6 % SOLN Place 1 drop into both eyes daily as needed (pollen allergies).    Marland Kitchen oxyCODONE (ROXICODONE) 5 MG immediate release tablet Take 1 tablet (5 mg total) by mouth every 6 (six) hours as needed. (Patient not taking: Reported on 07/31/2020) 30 tablet 0   No facility-administered medications prior to visit.    Review of Systems  Constitutional: Negative for chills, fever, malaise/fatigue and weight loss.  HENT: Negative for hearing loss, sore throat and tinnitus.   Eyes: Negative for blurred vision and double vision.  Respiratory: Positive for shortness of breath. Negative for cough, hemoptysis, sputum production, wheezing and stridor.   Cardiovascular: Negative for chest pain, palpitations, orthopnea, leg swelling and PND.  Gastrointestinal: Negative for abdominal pain, constipation, diarrhea, heartburn, nausea and vomiting.  Genitourinary: Negative for dysuria, hematuria and urgency.  Musculoskeletal: Negative for joint pain and myalgias.  Skin: Negative for itching and rash.  Neurological: Negative for dizziness, tingling, weakness and headaches.  Endo/Heme/Allergies: Negative for environmental allergies. Does not bruise/bleed easily.  Psychiatric/Behavioral: Negative for depression. The patient is not nervous/anxious and does not have insomnia.   All other systems reviewed and are  negative.    Objective:  Physical Exam Vitals reviewed.  Constitutional:      General: She is not in acute distress.    Appearance: She is well-developed.  HENT:     Head: Normocephalic and atraumatic.     Mouth/Throat:     Pharynx: No oropharyngeal exudate.  Eyes:     Conjunctiva/sclera: Conjunctivae normal.     Pupils: Pupils are equal, round, and reactive to light.  Neck:     Vascular: No JVD.     Trachea: No tracheal deviation.     Comments: Loss of supraclavicular fat Cardiovascular:     Rate and Rhythm: Normal rate and regular rhythm.     Heart sounds: S1 normal and S2 normal.     Comments: Distant heart tones Pulmonary:     Effort: No tachypnea or accessory muscle usage.     Breath sounds: No stridor. Decreased breath sounds (throughout all lung fields) present. No wheezing, rhonchi or rales.  Abdominal:     General: Bowel sounds are normal. There is no distension.     Palpations: Abdomen is soft.     Tenderness: There is no abdominal tenderness.  Musculoskeletal:        General: No deformity (muscle wasting ).  Skin:    General: Skin is warm and dry.     Capillary Refill: Capillary refill takes less than 2 seconds.     Findings: No rash.  Neurological:     Mental Status: She is alert and oriented to person, place, and time.  Psychiatric:        Behavior: Behavior normal.      Vitals:   08/17/20 1133  BP: (!) 164/80  Pulse: 80  Temp: (!) 96.9 F (36.1 C)  TempSrc: Other (Comment)  SpO2: 99%  Weight: 135 lb 12.8 oz (61.6 kg)  Height: 5\' 7"  (1.702 m)   99% on RA BMI Readings from Last 3 Encounters:  08/17/20 21.27 kg/m  07/31/20 20.67 kg/m  07/16/20  21.46 kg/m   Wt Readings from Last 3 Encounters:  08/17/20 135 lb 12.8 oz (61.6 kg)  07/31/20 132 lb (59.9 kg)  07/16/20 137 lb (62.1 kg)     CBC    Component Value Date/Time   WBC 13.1 (H) 03/10/2019 0823   RBC 4.70 03/10/2019 0823   HGB 12.8 03/10/2019 0823   HGB 13.0 03/24/2009 0913    HCT 40.5 03/10/2019 0823   HCT 38.8 03/24/2009 0913   PLT 287 03/10/2019 0823   PLT 317 03/24/2009 0913   MCV 86.2 03/10/2019 0823   MCV 82.4 03/24/2009 0913   MCH 27.2 03/10/2019 0823   MCHC 31.6 03/10/2019 0823   RDW 14.1 03/10/2019 0823   RDW 13.8 03/24/2009 0913   LYMPHSABS 1.0 02/05/2018 1748   LYMPHSABS 1.0 03/24/2009 0913   MONOABS 0.7 02/05/2018 1748   MONOABS 0.3 03/24/2009 0913   EOSABS 0.1 02/05/2018 1748   EOSABS 0.1 03/24/2009 0913   BASOSABS 0.0 02/05/2018 1748   BASOSABS 0.0 03/24/2009 0913    Chest Imaging: CT chest August 2021: Enlarging right upper lobe pulmonary nodule 7 mm in size with associated centrilobular emphysema.  Concerning for a low-grade malignancy. The patient's images have been independently reviewed by me.    Pulmonary Functions Testing Results: PFT Results Latest Ref Rng & Units 07/28/2020  FVC-Pre L 2.72  FVC-Predicted Pre % 86  FVC-Post L 2.93  FVC-Predicted Post % 93  Pre FEV1/FVC % % 58  Post FEV1/FCV % % 62  FEV1-Pre L 1.58  FEV1-Predicted Pre % 66  FEV1-Post L 1.81  DLCO uncorrected ml/min/mmHg 15.41  DLCO UNC% % 73  DLCO corrected ml/min/mmHg 15.41  DLCO COR %Predicted % 73  DLVA Predicted % 78  TLC L 6.35  TLC % Predicted % 115  RV % Predicted % 139    FeNO:   Pathology:   Echocardiogram:   Heart Catheterization:     Assessment & Plan:     ICD-10-CM   1. Nodule of upper lobe of right lung  R91.1 CT Super D Chest Wo Contrast    Ambulatory referral to Pulmonology  2. Stage 2 moderate COPD by GOLD classification ([place])  J44.9   3. Centrilobular emphysema ([facility])  J43.2     Assessment:   Enlarging right upper lobe pulmonary nodule concerning for malignancy Bilateral centrilobular emphysema Stage 2 COPD, FEV1 76% pBD   Plan Following Extensive Data Review & Interpretation:  . I reviewed prior external note(s) from 07/31/2020 surgical consultation Dr. Kipp Brood. . I reviewed the result(s) of nuclear medicine PET  scan 07/29/2020 (low level SUB uptake), pulmonary function tests at 07/28/2020 . I have ordered super D CT chest for navigational bronchoscopy planning New prescription today for Spiriva Respimat plus as needed albuterol.  Independent interpretation of tests . Review of patient's 07/01/2020 CT chest images revealed enlarging right upper lobe pulmonary nodule. The patient's images have been independently reviewed by me.    Today in the office we discussed risk benefits and alternatives of proceeding with navigational bronchoscopy and fiducial placement for candidacy of SBRT.  Patient would prefer to proceed with potential tissue diagnosis.  We discussed the risk of having a false negative result.  With approximately 60% diagnostic yield for subcentimeter nodules with attempt at tissue diagnosis for navigation.  Patient believes that this is an acceptable risk to have potential tissue diagnosis.  At the same time we will place fiducial markers in the event the tissue is nondiagnostic and we can follow  the lesion with serial CT imaging.  If it continues to grow with high likelihood of upper lobe predominance in malignancy and associated emphysema could proceed with SBRT after.  Discussion of management with Dr. Kipp Brood from cardiothoracic surgery.   Current Outpatient Medications:  .  aspirin EC 81 MG tablet, Take 1 tablet (81 mg total) by mouth daily., Disp: 90 tablet, Rfl: 3 .  atorvastatin (LIPITOR) 10 MG tablet, Take 10 mg by mouth 2 (two) times a week. Tuesday and Friday, Disp: , Rfl: 12 .  ibuprofen (ADVIL) 600 MG tablet, TAKE 1 TABLET BY MOUTH EVERY 8 HOURS AS NEEDED FOR HEADACHE OR MILD PAIN, Disp: 30 tablet, Rfl: 1 .  LORazepam (ATIVAN) 1 MG tablet, TAKE 1/2-1 TABLET BY MOUTH NO MORE THAN ONCE DAILY AS NEEDED FOR ANXIETY, Disp: 30 tablet, Rfl: 0 .  metoprolol tartrate (LOPRESSOR) 25 MG tablet, Take 25 mg by mouth daily as needed (palpitations). , Disp: , Rfl:  .  Multiple Vitamins-Minerals  (MULTIVITAMIN PO), Take 1 tablet by mouth daily., Disp: , Rfl:  .  Omega-3 Fatty Acids (FISH OIL PO), Take 5 mLs by mouth daily., Disp: , Rfl:  .  omeprazole (PRILOSEC) 20 MG capsule, Take 20 mg by mouth daily as needed. , Disp: , Rfl:  .  OVER THE COUNTER MEDICATION, Take 5 mLs by mouth daily. 1 teaspoon of vinegar, Disp: , Rfl:  .  OVER THE COUNTER MEDICATION, Take 15 mLs by mouth 3 (three) times daily with meals. Liquid Acidophilus, Disp: , Rfl:  .  Propylene Glycol-Glycerin (SOOTHE) 0.6-0.6 % SOLN, Place 1 drop into both eyes daily as needed (pollen allergies)., Disp: , Rfl:  .  albuterol (VENTOLIN HFA) 108 (90 Base) MCG/ACT inhaler, Inhale 2 puffs into the lungs every 6 (six) hours as needed for wheezing or shortness of breath., Disp: 8 g, Rfl: 6 .  oxyCODONE (ROXICODONE) 5 MG immediate release tablet, Take 1 tablet (5 mg total) by mouth every 6 (six) hours as needed. (Patient not taking: Reported on 07/31/2020), Disp: 30 tablet, Rfl: 0 .  Tiotropium Bromide Monohydrate (SPIRIVA RESPIMAT) 2.5 MCG/ACT AERS, Inhale 2 puffs into the lungs daily., Disp: 1 each, Rfl: 3  High medical decision making was dedicated to the care of this patient on the date of this encounter to include pre-visit review of records, face-to-face time with the patient discussing conditions above, post visit ordering of testing, clinical documentation with the electronic health record, making appropriate referrals as documented, and communicating necessary findings to members of the patients care team.   Garner Nash, [facility] 08/17/2020 12:01 PM

## 2020-08-17 NOTE — Telephone Encounter (Signed)
Pt has been scheduled for 10/12 at 7:30 at [facility] OR.  Pt will have covid test on 10/9 at 9:25.  I have spoken to her and gave her appt info.  Pt had CT chest on 8/18 at GI.  I spoke to [place] at [facility] CT & she is going to burn disk.  Lauren - lab orders need to be placed.

## 2020-08-17 NOTE — Patient Instructions (Addendum)
Thank you for visiting Dr. Valeta Harms at [facility]. Today we recommend the following:  Orders Placed This Encounter  Procedures  . CT Super D Chest Wo Contrast  . Ambulatory referral to Pulmonology   Meds ordered this encounter  Medications  . Tiotropium Bromide Monohydrate (SPIRIVA RESPIMAT) 2.5 MCG/ACT AERS    Sig: Inhale 2 puffs into the lungs daily.    Dispense:  1 each    Refill:  3  . albuterol (VENTOLIN HFA) 108 (90 Base) MCG/ACT inhaler    Sig: Inhale 2 puffs into the lungs every 6 (six) hours as needed for wheezing or shortness of breath.    Dispense:  8 g    Refill:  6   Procedure will be planned for oct 12th.  Please expect a call from pre-op services and we will schedule your super D CT scan.   Return in about 4 weeks (around 09/14/2020) for w/ Dr. Valeta Harms (can be Televisit) .     Please do your part to reduce the spread of COVID-19.

## 2020-08-17 NOTE — Addendum Note (Signed)
Addended by: Merrilee Seashore on: 08/17/2020 12:24 PM   Modules accepted: Orders

## 2020-08-17 NOTE — Telephone Encounter (Signed)
Noted  Labs ordered

## 2020-08-17 NOTE — H&P (View-Only) (Signed)
Synopsis: Referred in October 2021 for lung nodule by Lajean Manes, MD.  Referral from Dr. Kipp Brood.  Subjective:   PATIENT ID: Julie Mann GENDER: female DOB: 01/21/1946, MRN: [id number]  Chief Complaint  Patient presents with  . Consult    shortness of breath walking up hills and stairs    This is a 74 year old female, history of anal cancer status post chemo plus radiation, atrial fibrillation on aspirin 81 mg daily.  Patient is a former smoker, 30-pack-year history.  CT imaging of the chest was completed in August 2021 by primary care which revealed a interval increase in a subcentimeter pulmonary nodule now 7 mm in size.  It has slowly been growing since 2019.  Images reviewed from 2019 at 5 mm, August 2020 at 6 mm and now August 2021 nodules at 7 mm.  She does have associated upper lobe predominant emphysema.  She is currently relatively asymptomatic except for dyspnea on exertion.  She did have pulmonary function test completed recently which revealed an FEV1 postbronchodilator of 76% consistent with stage II COPD.  She does notice that her dyspnea is worse with climbing stairs.  She does feel out of breath and has to stop which has changed some and likely gotten worse over the past 6 months.  Rarely has any sputum production.  She does not use any maintenance inhalers.  She does not have an albuterol inhaler.  She did have anal cancer and tolerated the radiation well.  She said she did not do well with the chemo treatments.  She is very anxious about having a large surgery for a lung nodule.  If she could have radiation treatments she would prefer this.  Today I discussed the risk benefits and alternatives of proceeding with navigational bronchoscopy.   Past Medical History:  Diagnosis Date  . Anal cancer ([place]) 2005   SCCa anus-stage II chemo, radiation  . Arthritis    "hands, fingers, back" (02/27/2014)  . Atrial fibrillation ([place])   . Cat scratch of right hand 02/24/2014  .  Dysrhythmia    a fib  . Fall 01/2018  . Fall from horse 01/2018  . Fracture, ankle 02/2019   Right   . High cholesterol   . History of stomach ulcers ~ 1966  . Osteopenia      Family History  Problem Relation Age of Onset  . Breast cancer Maternal Aunt   . Breast cancer Maternal Grandmother   . Heart disease Mother   . Stroke Father   . Rectal cancer Paternal Grandmother   . Breast cancer Sister 83  . Breast cancer Sister 35     Past Surgical History:  Procedure Laterality Date  . ANUS SURGERY  2005   "biopsy"  . BREAST CYST EXCISION  1966  . DILATION AND CURETTAGE OF UTERUS  1980's   S/P miscarriage  . EXTERNAL FIXATION LEG Right 03/10/2019   Procedure: OPEN REDUCTION INTERNAL FIXATION RIGHT ANKLE;  Surgeon: Marchia Bond, MD;  Location: [facility];  Service: Orthopedics;  Laterality: Right;  Marland Kitchen VARICOSE VEIN SURGERY Left    left leg    Social History   Socioeconomic History  . Marital status: Married    Spouse name: Not on file  . Number of children: Not on file  . Years of education: Not on file  . Highest education level: Not on file  Occupational History  . Not on file  Tobacco Use  . Smoking status: Former Smoker    Packs/day:  1.00    Years: 30.00    Pack years: 30.00    Types: Cigarettes    Quit date: 2005    Years since quitting: 16.7  . Smokeless tobacco: Never Used  Vaping Use  . Vaping Use: Never used  Substance and Sexual Activity  . Alcohol use: No  . Drug use: No  . Sexual activity: Yes    Partners: Male    Birth control/protection: Post-menopausal  Other Topics Concern  . Not on file  Social History Narrative  . Not on file   Social Determinants of Health   Financial Resource Strain:   . Difficulty of Paying Living Expenses: Not on file  Food Insecurity:   . Worried About Charity fundraiser in the Last Year: Not on file  . Ran Out of Food in the Last Year: Not on file  Transportation Needs:   . Lack of Transportation (Medical): Not  on file  . Lack of Transportation (Non-Medical): Not on file  Physical Activity:   . Days of Exercise per Week: Not on file  . Minutes of Exercise per Session: Not on file  Stress:   . Feeling of Stress : Not on file  Social Connections:   . Frequency of Communication with Friends and Family: Not on file  . Frequency of Social Gatherings with Friends and Family: Not on file  . Attends Religious Services: Not on file  . Active Member of Clubs or Organizations: Not on file  . Attends Archivist Meetings: Not on file  . Marital Status: Not on file  Intimate Partner Violence:   . Fear of Current or Ex-Partner: Not on file  . Emotionally Abused: Not on file  . Physically Abused: Not on file  . Sexually Abused: Not on file     Allergies  Allergen Reactions  . Eliquis [Apixaban] Anaphylaxis    Made her feel sick  . Ivp Dye [Iodinated Diagnostic Agents] Swelling  . Doxycycline Other (See Comments)    Dizziness and tingling  . Erythromycin Other (See Comments)    Hallucinations   . Flonase [Fluticasone Propionate]   . Keflex [Cephalexin] Other (See Comments)    Hallucinations.  . Paxil [Paroxetine Hcl] Diarrhea  . Premarin [Conjugated Estrogens]     Felt weird  . Provera [Medroxyprogesterone Acetate]     Patient do not remember reaction  . Xarelto [Rivaroxaban]     Made her feel sick  . Zithromax [Azithromycin] Other (See Comments)    hallucinations  . Zoloft [Sertraline Hcl] Other (See Comments)    sedation     Outpatient Medications Prior to Visit  Medication Sig Dispense Refill  . aspirin EC 81 MG tablet Take 1 tablet (81 mg total) by mouth daily. 90 tablet 3  . atorvastatin (LIPITOR) 10 MG tablet Take 10 mg by mouth 2 (two) times a week. Tuesday and Friday  12  . ibuprofen (ADVIL) 600 MG tablet TAKE 1 TABLET BY MOUTH EVERY 8 HOURS AS NEEDED FOR HEADACHE OR MILD PAIN 30 tablet 1  . LORazepam (ATIVAN) 1 MG tablet TAKE 1/2-1 TABLET BY MOUTH NO MORE THAN ONCE  DAILY AS NEEDED FOR ANXIETY 30 tablet 0  . metoprolol tartrate (LOPRESSOR) 25 MG tablet Take 25 mg by mouth daily as needed (palpitations).     . Multiple Vitamins-Minerals (MULTIVITAMIN PO) Take 1 tablet by mouth daily.    . Omega-3 Fatty Acids (FISH OIL PO) Take 5 mLs by mouth daily.    Marland Kitchen omeprazole (  PRILOSEC) 20 MG capsule Take 20 mg by mouth daily as needed.     Marland Kitchen OVER THE COUNTER MEDICATION Take 5 mLs by mouth daily. 1 teaspoon of vinegar    . OVER THE COUNTER MEDICATION Take 15 mLs by mouth 3 (three) times daily with meals. Liquid Acidophilus    . Propylene Glycol-Glycerin (SOOTHE) 0.6-0.6 % SOLN Place 1 drop into both eyes daily as needed (pollen allergies).    Marland Kitchen oxyCODONE (ROXICODONE) 5 MG immediate release tablet Take 1 tablet (5 mg total) by mouth every 6 (six) hours as needed. (Patient not taking: Reported on 07/31/2020) 30 tablet 0   No facility-administered medications prior to visit.    Review of Systems  Constitutional: Negative for chills, fever, malaise/fatigue and weight loss.  HENT: Negative for hearing loss, sore throat and tinnitus.   Eyes: Negative for blurred vision and double vision.  Respiratory: Positive for shortness of breath. Negative for cough, hemoptysis, sputum production, wheezing and stridor.   Cardiovascular: Negative for chest pain, palpitations, orthopnea, leg swelling and PND.  Gastrointestinal: Negative for abdominal pain, constipation, diarrhea, heartburn, nausea and vomiting.  Genitourinary: Negative for dysuria, hematuria and urgency.  Musculoskeletal: Negative for joint pain and myalgias.  Skin: Negative for itching and rash.  Neurological: Negative for dizziness, tingling, weakness and headaches.  Endo/Heme/Allergies: Negative for environmental allergies. Does not bruise/bleed easily.  Psychiatric/Behavioral: Negative for depression. The patient is not nervous/anxious and does not have insomnia.   All other systems reviewed and are  negative.    Objective:  Physical Exam Vitals reviewed.  Constitutional:      General: She is not in acute distress.    Appearance: She is well-developed.  HENT:     Head: Normocephalic and atraumatic.     Mouth/Throat:     Pharynx: No oropharyngeal exudate.  Eyes:     Conjunctiva/sclera: Conjunctivae normal.     Pupils: Pupils are equal, round, and reactive to light.  Neck:     Vascular: No JVD.     Trachea: No tracheal deviation.     Comments: Loss of supraclavicular fat Cardiovascular:     Rate and Rhythm: Normal rate and regular rhythm.     Heart sounds: S1 normal and S2 normal.     Comments: Distant heart tones Pulmonary:     Effort: No tachypnea or accessory muscle usage.     Breath sounds: No stridor. Decreased breath sounds (throughout all lung fields) present. No wheezing, rhonchi or rales.  Abdominal:     General: Bowel sounds are normal. There is no distension.     Palpations: Abdomen is soft.     Tenderness: There is no abdominal tenderness.  Musculoskeletal:        General: No deformity (muscle wasting ).  Skin:    General: Skin is warm and dry.     Capillary Refill: Capillary refill takes less than 2 seconds.     Findings: No rash.  Neurological:     Mental Status: She is alert and oriented to person, place, and time.  Psychiatric:        Behavior: Behavior normal.      Vitals:   08/17/20 1133  BP: (!) 164/80  Pulse: 80  Temp: (!) 96.9 F (36.1 C)  TempSrc: Other (Comment)  SpO2: 99%  Weight: 135 lb 12.8 oz (61.6 kg)  Height: 5\' 7"  (1.702 m)   99% on RA BMI Readings from Last 3 Encounters:  08/17/20 21.27 kg/m  07/31/20 20.67 kg/m  07/16/20  21.46 kg/m   Wt Readings from Last 3 Encounters:  08/17/20 135 lb 12.8 oz (61.6 kg)  07/31/20 132 lb (59.9 kg)  07/16/20 137 lb (62.1 kg)     CBC    Component Value Date/Time   WBC 13.1 (H) 03/10/2019 0823   RBC 4.70 03/10/2019 0823   HGB 12.8 03/10/2019 0823   HGB 13.0 03/24/2009 0913    HCT 40.5 03/10/2019 0823   HCT 38.8 03/24/2009 0913   PLT 287 03/10/2019 0823   PLT 317 03/24/2009 0913   MCV 86.2 03/10/2019 0823   MCV 82.4 03/24/2009 0913   MCH 27.2 03/10/2019 0823   MCHC 31.6 03/10/2019 0823   RDW 14.1 03/10/2019 0823   RDW 13.8 03/24/2009 0913   LYMPHSABS 1.0 02/05/2018 1748   LYMPHSABS 1.0 03/24/2009 0913   MONOABS 0.7 02/05/2018 1748   MONOABS 0.3 03/24/2009 0913   EOSABS 0.1 02/05/2018 1748   EOSABS 0.1 03/24/2009 0913   BASOSABS 0.0 02/05/2018 1748   BASOSABS 0.0 03/24/2009 0913    Chest Imaging: CT chest August 2021: Enlarging right upper lobe pulmonary nodule 7 mm in size with associated centrilobular emphysema.  Concerning for a low-grade malignancy. The patient's images have been independently reviewed by me.    Pulmonary Functions Testing Results: PFT Results Latest Ref Rng & Units 07/28/2020  FVC-Pre L 2.72  FVC-Predicted Pre % 86  FVC-Post L 2.93  FVC-Predicted Post % 93  Pre FEV1/FVC % % 58  Post FEV1/FCV % % 62  FEV1-Pre L 1.58  FEV1-Predicted Pre % 66  FEV1-Post L 1.81  DLCO uncorrected ml/min/mmHg 15.41  DLCO UNC% % 73  DLCO corrected ml/min/mmHg 15.41  DLCO COR %Predicted % 73  DLVA Predicted % 78  TLC L 6.35  TLC % Predicted % 115  RV % Predicted % 139    FeNO:   Pathology:   Echocardiogram:   Heart Catheterization:     Assessment & Plan:     ICD-10-CM   1. Nodule of upper lobe of right lung  R91.1 CT Super D Chest Wo Contrast    Ambulatory referral to Pulmonology  2. Stage 2 moderate COPD by GOLD classification ([place])  J44.9   3. Centrilobular emphysema ([facility])  J43.2     Assessment:   Enlarging right upper lobe pulmonary nodule concerning for malignancy Bilateral centrilobular emphysema Stage 2 COPD, FEV1 76% pBD   Plan Following Extensive Data Review & Interpretation:  . I reviewed prior external note(s) from 07/31/2020 surgical consultation Dr. Kipp Brood. . I reviewed the result(s) of nuclear medicine PET  scan 07/29/2020 (low level SUB uptake), pulmonary function tests at 07/28/2020 . I have ordered super D CT chest for navigational bronchoscopy planning New prescription today for Spiriva Respimat plus as needed albuterol.  Independent interpretation of tests . Review of patient's 07/01/2020 CT chest images revealed enlarging right upper lobe pulmonary nodule. The patient's images have been independently reviewed by me.    Today in the office we discussed risk benefits and alternatives of proceeding with navigational bronchoscopy and fiducial placement for candidacy of SBRT.  Patient would prefer to proceed with potential tissue diagnosis.  We discussed the risk of having a false negative result.  With approximately 60% diagnostic yield for subcentimeter nodules with attempt at tissue diagnosis for navigation.  Patient believes that this is an acceptable risk to have potential tissue diagnosis.  At the same time we will place fiducial markers in the event the tissue is nondiagnostic and we can follow  the lesion with serial CT imaging.  If it continues to grow with high likelihood of upper lobe predominance in malignancy and associated emphysema could proceed with SBRT after.  Discussion of management with Dr. Kipp Brood from cardiothoracic surgery.   Current Outpatient Medications:  .  aspirin EC 81 MG tablet, Take 1 tablet (81 mg total) by mouth daily., Disp: 90 tablet, Rfl: 3 .  atorvastatin (LIPITOR) 10 MG tablet, Take 10 mg by mouth 2 (two) times a week. Tuesday and Friday, Disp: , Rfl: 12 .  ibuprofen (ADVIL) 600 MG tablet, TAKE 1 TABLET BY MOUTH EVERY 8 HOURS AS NEEDED FOR HEADACHE OR MILD PAIN, Disp: 30 tablet, Rfl: 1 .  LORazepam (ATIVAN) 1 MG tablet, TAKE 1/2-1 TABLET BY MOUTH NO MORE THAN ONCE DAILY AS NEEDED FOR ANXIETY, Disp: 30 tablet, Rfl: 0 .  metoprolol tartrate (LOPRESSOR) 25 MG tablet, Take 25 mg by mouth daily as needed (palpitations). , Disp: , Rfl:  .  Multiple Vitamins-Minerals  (MULTIVITAMIN PO), Take 1 tablet by mouth daily., Disp: , Rfl:  .  Omega-3 Fatty Acids (FISH OIL PO), Take 5 mLs by mouth daily., Disp: , Rfl:  .  omeprazole (PRILOSEC) 20 MG capsule, Take 20 mg by mouth daily as needed. , Disp: , Rfl:  .  OVER THE COUNTER MEDICATION, Take 5 mLs by mouth daily. 1 teaspoon of vinegar, Disp: , Rfl:  .  OVER THE COUNTER MEDICATION, Take 15 mLs by mouth 3 (three) times daily with meals. Liquid Acidophilus, Disp: , Rfl:  .  Propylene Glycol-Glycerin (SOOTHE) 0.6-0.6 % SOLN, Place 1 drop into both eyes daily as needed (pollen allergies)., Disp: , Rfl:  .  albuterol (VENTOLIN HFA) 108 (90 Base) MCG/ACT inhaler, Inhale 2 puffs into the lungs every 6 (six) hours as needed for wheezing or shortness of breath., Disp: 8 g, Rfl: 6 .  oxyCODONE (ROXICODONE) 5 MG immediate release tablet, Take 1 tablet (5 mg total) by mouth every 6 (six) hours as needed. (Patient not taking: Reported on 07/31/2020), Disp: 30 tablet, Rfl: 0 .  Tiotropium Bromide Monohydrate (SPIRIVA RESPIMAT) 2.5 MCG/ACT AERS, Inhale 2 puffs into the lungs daily., Disp: 1 each, Rfl: 3  High medical decision making was dedicated to the care of this patient on the date of this encounter to include pre-visit review of records, face-to-face time with the patient discussing conditions above, post visit ordering of testing, clinical documentation with the electronic health record, making appropriate referrals as documented, and communicating necessary findings to members of the patients care team.   Garner Nash, [facility] 08/17/2020 12:01 PM

## 2020-08-19 ENCOUNTER — Telehealth: Payer: Self-pay | Admitting: Pulmonary Disease

## 2020-08-19 NOTE — Telephone Encounter (Signed)
Ok thanks for letting me know.  Garner Nash, DO [facility] 08/19/2020 8:19 PM

## 2020-08-19 NOTE — Telephone Encounter (Signed)
Called and spoke with patient who states yesterday she was put on Spiriva and after she used it she started having headache, chest heaviness, trouble focusing. Patient used it again today and had the same symptoms. Patient has stopped taking medication, but having biopsy Tuesday and wanted to alert [place]. I have advised patient to completely stop using inhaler at this time and that we would let her know of any further recommendations.   Dr. Valeta Harms please advise

## 2020-08-20 NOTE — Telephone Encounter (Signed)
No, just stop the inhaler  Thanks BLI

## 2020-08-20 NOTE — Telephone Encounter (Signed)
Do you have any recommendations for this patient?

## 2020-08-22 ENCOUNTER — Other Ambulatory Visit (HOSPITAL_COMMUNITY)
Admission: RE | Admit: 2020-08-22 | Discharge: 2020-08-22 | Disposition: A | Discharge status: Home / Self Care | Payer: Medicare Other | Source: Ambulatory Visit | Attending: Pulmonary Disease | Admitting: Pulmonary Disease

## 2020-08-22 DIAGNOSIS — Z20822 Contact with and (suspected) exposure to covid-19: Secondary | ICD-10-CM | POA: Diagnosis not present

## 2020-08-22 DIAGNOSIS — Z01812 Encounter for preprocedural laboratory examination: Secondary | ICD-10-CM | POA: Diagnosis present

## 2020-08-22 LAB — SARS CORONAVIRUS 2 (TAT 6-24 HRS): SARS Coronavirus 2: NEGATIVE

## 2020-08-24 ENCOUNTER — Encounter (HOSPITAL_COMMUNITY): Payer: Self-pay | Admitting: Pulmonary Disease

## 2020-08-24 ENCOUNTER — Other Ambulatory Visit: Payer: Self-pay

## 2020-08-24 ENCOUNTER — Telehealth: Payer: Self-pay | Admitting: Pulmonary Disease

## 2020-08-24 NOTE — Telephone Encounter (Signed)
[place] for patient to be on ASA 81mg   Thanks Garner Nash, DO [facility] 08/24/2020 4:39 PM

## 2020-08-24 NOTE — Progress Notes (Addendum)
Mrs. Julie Mann denies chest pain or shortness of breath. Patient tested negtive for Covid on 08/22/20 and has been in quarantine with her husband.  Mrs. Julie Mann's cardiologist is Dr. Linard Millers, who has a note dated 9/,  May have surgery if she has not had chest pain, swelling or dyspnea. Mrs. Julie Mann denies any symptoms.   Mrs. Julie Mann takes 81 mg of Aspirin, I sent a message to Dr. Valeta Harms and called his office, spoke to Julie Mann who is sending a message to ask Dr. Valeta Harms or his nurse if patient should have stopped ASA. I instructed Mrs. Julie Mann to not take Aspirin in am.  Karoline Caldwell, PA-C review cahrt.

## 2020-08-24 NOTE — Telephone Encounter (Signed)
Spoke to Jan with pre admit testing. Jan stated that patient is scheduled for bronch tomorrow.  Patient did not stop Asprin 81mg . Jan is questioning if okay to proceed with bronch?   Dr. Valeta Harms, please advise. Thanks

## 2020-08-24 NOTE — Telephone Encounter (Signed)
Jan with pre admit testing is aware of below message and voiced her understanding.  Nothing further needed.

## 2020-08-25 ENCOUNTER — Ambulatory Visit (HOSPITAL_COMMUNITY): Payer: Medicare Other | Admitting: Certified Registered Nurse Anesthetist

## 2020-08-25 ENCOUNTER — Encounter (HOSPITAL_COMMUNITY)
Admission: RE | Disposition: A | Discharge status: Home / Self Care | Payer: Self-pay | Source: Home / Self Care | Attending: Pulmonary Disease

## 2020-08-25 ENCOUNTER — Ambulatory Visit (HOSPITAL_COMMUNITY): Payer: Medicare Other

## 2020-08-25 ENCOUNTER — Encounter (HOSPITAL_COMMUNITY): Payer: Self-pay | Admitting: Pulmonary Disease

## 2020-08-25 ENCOUNTER — Other Ambulatory Visit: Payer: Self-pay

## 2020-08-25 ENCOUNTER — Ambulatory Visit (HOSPITAL_COMMUNITY)
Admission: RE | Admit: 2020-08-25 | Discharge: 2020-08-25 | Disposition: A | Discharge status: Home / Self Care | Payer: Medicare Other | Attending: Pulmonary Disease | Admitting: Pulmonary Disease

## 2020-08-25 DIAGNOSIS — M19042 Primary osteoarthritis, left hand: Secondary | ICD-10-CM | POA: Diagnosis not present

## 2020-08-25 DIAGNOSIS — I4891 Unspecified atrial fibrillation: Secondary | ICD-10-CM | POA: Diagnosis not present

## 2020-08-25 DIAGNOSIS — Z79899 Other long term (current) drug therapy: Secondary | ICD-10-CM | POA: Diagnosis not present

## 2020-08-25 DIAGNOSIS — M19041 Primary osteoarthritis, right hand: Secondary | ICD-10-CM | POA: Insufficient documentation

## 2020-08-25 DIAGNOSIS — J432 Centrilobular emphysema: Secondary | ICD-10-CM | POA: Diagnosis not present

## 2020-08-25 DIAGNOSIS — Z888 Allergy status to other drugs, medicaments and biological substances status: Secondary | ICD-10-CM | POA: Diagnosis not present

## 2020-08-25 DIAGNOSIS — Z419 Encounter for procedure for purposes other than remedying health state, unspecified: Secondary | ICD-10-CM

## 2020-08-25 DIAGNOSIS — Z87891 Personal history of nicotine dependence: Secondary | ICD-10-CM | POA: Diagnosis not present

## 2020-08-25 DIAGNOSIS — F419 Anxiety disorder, unspecified: Secondary | ICD-10-CM | POA: Diagnosis not present

## 2020-08-25 DIAGNOSIS — R911 Solitary pulmonary nodule: Secondary | ICD-10-CM | POA: Diagnosis not present

## 2020-08-25 DIAGNOSIS — Z881 Allergy status to other antibiotic agents status: Secondary | ICD-10-CM | POA: Insufficient documentation

## 2020-08-25 DIAGNOSIS — Z7982 Long term (current) use of aspirin: Secondary | ICD-10-CM | POA: Diagnosis not present

## 2020-08-25 DIAGNOSIS — Z85048 Personal history of other malignant neoplasm of rectum, rectosigmoid junction, and anus: Secondary | ICD-10-CM | POA: Diagnosis not present

## 2020-08-25 DIAGNOSIS — Z9889 Other specified postprocedural states: Secondary | ICD-10-CM

## 2020-08-25 DIAGNOSIS — E7800 Pure hypercholesterolemia, unspecified: Secondary | ICD-10-CM | POA: Diagnosis not present

## 2020-08-25 HISTORY — DX: Dyspnea, unspecified: R06.00

## 2020-08-25 HISTORY — DX: Anxiety disorder, unspecified: F41.9

## 2020-08-25 HISTORY — DX: Chronic obstructive pulmonary disease, unspecified: J44.9

## 2020-08-25 HISTORY — PX: VIDEO BRONCHOSCOPY WITH ENDOBRONCHIAL NAVIGATION: SHX6175

## 2020-08-25 LAB — CBC
HCT: 40.5 % (ref 36.0–46.0)
Hemoglobin: 12.6 g/dL (ref 12.0–15.0)
MCH: 26.5 pg (ref 26.0–34.0)
MCHC: 31.1 g/dL (ref 30.0–36.0)
MCV: 85.1 fL (ref 80.0–100.0)
Platelets: 321 10*3/uL (ref 150–400)
RBC: 4.76 MIL/uL (ref 3.87–5.11)
RDW: 14.2 % (ref 11.5–15.5)
WBC: 6.6 10*3/uL (ref 4.0–10.5)
nRBC: 0 % (ref 0.0–0.2)

## 2020-08-25 LAB — APTT: aPTT: 29 seconds (ref 24–36)

## 2020-08-25 LAB — COMPREHENSIVE METABOLIC PANEL
ALT: 14 U/L (ref 0–44)
AST: 20 U/L (ref 15–41)
Albumin: 3.6 g/dL (ref 3.5–5.0)
Alkaline Phosphatase: 52 U/L (ref 38–126)
Anion gap: 11 (ref 5–15)
BUN: 10 mg/dL (ref 8–23)
CO2: 25 mmol/L (ref 22–32)
Calcium: 9.5 mg/dL (ref 8.9–10.3)
Chloride: 104 mmol/L (ref 98–111)
Creatinine, Ser: 1.01 mg/dL — ABNORMAL HIGH (ref 0.44–1.00)
GFR, Estimated: 54 mL/min — ABNORMAL LOW (ref >60)
Glucose, Bld: 96 mg/dL (ref 70–99)
Potassium: 4 mmol/L (ref 3.5–5.1)
Sodium: 140 mmol/L (ref 135–145)
Total Bilirubin: 0.4 mg/dL (ref 0.3–1.2)
Total Protein: 6.5 g/dL (ref 6.5–8.1)

## 2020-08-25 LAB — PROTIME-INR
INR: 1 (ref 0.8–1.2)
Prothrombin Time: 12.4 seconds (ref 11.4–15.2)

## 2020-08-25 SURGERY — VIDEO BRONCHOSCOPY WITH ENDOBRONCHIAL NAVIGATION
Anesthesia: General | Laterality: Right

## 2020-08-25 MED ORDER — DEXAMETHASONE SODIUM PHOSPHATE 10 MG/ML IJ SOLN
INTRAMUSCULAR | Status: DC | PRN
  Administered 2020-08-25: 5 mg via INTRAVENOUS

## 2020-08-25 MED ORDER — FENTANYL CITRATE (PF) 250 MCG/5ML IJ SOLN
INTRAMUSCULAR | Status: DC | PRN
Start: 2020-08-25 — End: 2020-08-25
  Administered 2020-08-25: 100 ug via INTRAVENOUS

## 2020-08-25 MED ORDER — ROCURONIUM BROMIDE 10 MG/ML (PF) SYRINGE
PREFILLED_SYRINGE | INTRAVENOUS | Status: DC | PRN
  Administered 2020-08-25: 50 mg via INTRAVENOUS

## 2020-08-25 MED ORDER — EPHEDRINE SULFATE-NACL 50-0.9 MG/10ML-% IV SOSY
PREFILLED_SYRINGE | INTRAVENOUS | Status: DC | PRN
  Administered 2020-08-25 (×2): 5 mg via INTRAVENOUS

## 2020-08-25 MED ORDER — FENTANYL CITRATE (PF) 250 MCG/5ML IJ SOLN
INTRAMUSCULAR | Status: AC
  Filled 2020-08-25: qty 5

## 2020-08-25 MED ORDER — ROCURONIUM BROMIDE 10 MG/ML (PF) SYRINGE
PREFILLED_SYRINGE | INTRAVENOUS | Status: AC
  Filled 2020-08-25: qty 10

## 2020-08-25 MED ORDER — ONDANSETRON HCL 4 MG/2ML IJ SOLN
INTRAMUSCULAR | Status: AC
  Filled 2020-08-25: qty 2

## 2020-08-25 MED ORDER — LACTATED RINGERS IV SOLN: INTRAVENOUS | Status: DC

## 2020-08-25 MED ORDER — EPINEPHRINE PF 1 MG/ML IJ SOLN
INTRAMUSCULAR | Status: AC
  Filled 2020-08-25: qty 4

## 2020-08-25 MED ORDER — ORAL CARE MOUTH RINSE: 15.0000 mL | Freq: Once | OROMUCOSAL | Status: AC

## 2020-08-25 MED ORDER — SUGAMMADEX SODIUM 200 MG/2ML IV SOLN
INTRAVENOUS | Status: DC | PRN
  Administered 2020-08-25: 200 mg via INTRAVENOUS

## 2020-08-25 MED ORDER — CHLORHEXIDINE GLUCONATE 0.12 % MT SOLN
15.0000 mL | Freq: Once | OROMUCOSAL | Status: AC
  Administered 2020-08-25: 15 mL via OROMUCOSAL
  Filled 2020-08-25: qty 15

## 2020-08-25 MED ORDER — LIDOCAINE 2% (20 MG/ML) 5 ML SYRINGE
INTRAMUSCULAR | Status: DC | PRN
  Administered 2020-08-25: 40 mg via INTRAVENOUS

## 2020-08-25 MED ORDER — PROPOFOL 10 MG/ML IV BOLUS
INTRAVENOUS | Status: AC
  Filled 2020-08-25: qty 20

## 2020-08-25 MED ORDER — 0.9 % SODIUM CHLORIDE (POUR BTL) OPTIME
TOPICAL | Status: DC | PRN
  Administered 2020-08-25: 1000 mL

## 2020-08-25 MED ORDER — ONDANSETRON HCL 4 MG/2ML IJ SOLN
INTRAMUSCULAR | Status: DC | PRN
  Administered 2020-08-25: 4 mg via INTRAVENOUS

## 2020-08-25 MED ORDER — DEXAMETHASONE SODIUM PHOSPHATE 10 MG/ML IJ SOLN
INTRAMUSCULAR | Status: AC
  Filled 2020-08-25: qty 1

## 2020-08-25 MED ORDER — PHENYLEPHRINE 40 MCG/ML (10ML) SYRINGE FOR IV PUSH (FOR BLOOD PRESSURE SUPPORT)
PREFILLED_SYRINGE | INTRAVENOUS | Status: DC | PRN
  Administered 2020-08-25: 80 ug via INTRAVENOUS
  Administered 2020-08-25: 120 ug via INTRAVENOUS
  Administered 2020-08-25: 80 ug via INTRAVENOUS
  Administered 2020-08-25: 40 ug via INTRAVENOUS
  Administered 2020-08-25: 80 ug via INTRAVENOUS

## 2020-08-25 MED ORDER — PHENYLEPHRINE HCL-NACL 10-0.9 MG/250ML-% IV SOLN
INTRAVENOUS | Status: DC | PRN
  Administered 2020-08-25: 25 ug/min via INTRAVENOUS

## 2020-08-25 MED ORDER — LIDOCAINE 2% (20 MG/ML) 5 ML SYRINGE
INTRAMUSCULAR | Status: AC
  Filled 2020-08-25: qty 5

## 2020-08-25 MED ORDER — PROPOFOL 10 MG/ML IV BOLUS
INTRAVENOUS | Status: DC | PRN
  Administered 2020-08-25: 120 mg via INTRAVENOUS
  Administered 2020-08-25: 30 mg via INTRAVENOUS

## 2020-08-25 SURGICAL SUPPLY — 45 items
ADAPTER BRONCHOSCOPE OLYMPUS (ADAPTER) ×2 IMPLANT
ADAPTER VALVE BIOPSY EBUS (MISCELLANEOUS) IMPLANT
ADPR BSCP OLMPS EDG (ADAPTER) ×1
ADPTR VALVE BIOPSY EBUS (MISCELLANEOUS)
BRUSH BIOPSY BRONCH 10 SDTNB (MISCELLANEOUS) ×2 IMPLANT
BRUSH CYTOL CELLEBRITY 1.5X140 (MISCELLANEOUS) ×2 IMPLANT
BRUSH SUPERTRAX BIOPSY (INSTRUMENTS) IMPLANT
BRUSH SUPERTRAX NDL-TIP CYTO (INSTRUMENTS) ×2 IMPLANT
CANISTER SUCT 3000ML PPV (MISCELLANEOUS) ×2 IMPLANT
CNTNR URN SCR LID CUP LEK RST (MISCELLANEOUS) ×1 IMPLANT
CONT SPEC 4OZ STRL OR WHT (MISCELLANEOUS) ×2
COVER BACK TABLE 60X90IN (DRAPES) ×2 IMPLANT
COVER WAND RF STERILE (DRAPES) ×2 IMPLANT
FILTER STRAW FLUID ASPIR (MISCELLANEOUS) IMPLANT
FORCEPS BIOP SUPERTRX PREMAR (INSTRUMENTS) ×3 IMPLANT
GAUZE SPONGE 4X4 12PLY STRL (GAUZE/BANDAGES/DRESSINGS) ×2 IMPLANT
GLOVE SURG SS PI 7.5 STRL IVOR (GLOVE) ×2 IMPLANT
GOWN STRL REUS W/ TWL LRG LVL3 (GOWN DISPOSABLE) ×2 IMPLANT
GOWN STRL REUS W/TWL LRG LVL3 (GOWN DISPOSABLE) ×4
KIT CLEAN ENDO COMPLIANCE (KITS) ×2 IMPLANT
KIT ILLUMISITE 180 PROCEDURE (KITS) ×1 IMPLANT
KIT ILLUMISITE 90 PROCEDURE (KITS) IMPLANT
KIT LOCATABLE GUIDE (CANNULA) IMPLANT
KIT MARKER FIDUCIAL DELIVERY (KITS) ×1 IMPLANT
KIT TURNOVER KIT B (KITS) ×2 IMPLANT
MARKER FIDUCIAL SL NIT COIL (Implant Marker) ×3 IMPLANT
MARKER SKIN DUAL TIP RULER LAB (MISCELLANEOUS) ×2 IMPLANT
NDL SUPERTRX PREMARK BIOPSY (NEEDLE) ×1 IMPLANT
NEEDLE SUPERTRX PREMARK BIOPSY (NEEDLE) ×2 IMPLANT
NS IRRIG 1000ML POUR BTL (IV SOLUTION) ×2 IMPLANT
OIL SILICONE PENTAX (PARTS (SERVICE/REPAIRS)) ×2 IMPLANT
PAD ARMBOARD 7.5X6 YLW CONV (MISCELLANEOUS) ×4 IMPLANT
PATCHES PATIENT (LABEL) ×6 IMPLANT
SYR 20ML ECCENTRIC (SYRINGE) ×2 IMPLANT
SYR 20ML LL LF (SYRINGE) ×2 IMPLANT
SYR 50ML SLIP (SYRINGE) ×2 IMPLANT
TOWEL GREEN STERILE FF (TOWEL DISPOSABLE) ×2 IMPLANT
TRAP SPECIMEN MUCUS 40CC (MISCELLANEOUS) ×1 IMPLANT
TUBE CONNECTING 12X1/4 (SUCTIONS) ×2 IMPLANT
TUBE CONNECTING 20X1/4 (TUBING) ×2 IMPLANT
UNDERPAD 30X36 HEAVY ABSORB (UNDERPADS AND DIAPERS) ×2 IMPLANT
VALVE BIOPSY  SINGLE USE (MISCELLANEOUS) ×2
VALVE BIOPSY SINGLE USE (MISCELLANEOUS) ×1 IMPLANT
VALVE SUCTION BRONCHIO DISP (MISCELLANEOUS) ×2 IMPLANT
WATER STERILE IRR 1000ML POUR (IV SOLUTION) ×2 IMPLANT

## 2020-08-25 NOTE — Interval H&P Note (Signed)
History and Physical Interval Note:  08/25/2020 7:09 AM  Julie Mann  has presented today for surgery, with the diagnosis of lung nodule.  The various methods of treatment have been discussed with the patient and family. After consideration of risks, benefits and other options for treatment, the patient has consented to  Procedure(s): [place] (N/A) as a surgical intervention.  The patient's history has been reviewed, patient examined, no change in status, stable for surgery.  I have reviewed the patient's chart and labs.  Questions were answered to the patient's satisfaction.    Patient seen in pre-op all questions answered. We discussed the risks, benefits and alternatives. Risks included bleeding, pneumothorax and death. The patient is agreeable to proceed.   [place]

## 2020-08-25 NOTE — Anesthesia Procedure Notes (Signed)
Procedure Name: Intubation Date/Time: 08/25/2020 7:30 AM Performed by: Dorthea Cove, CRNA Pre-anesthesia Checklist: Patient identified, Emergency Drugs available, Suction available and Patient being monitored Patient Re-evaluated:Patient Re-evaluated prior to induction Oxygen Delivery Method: Circle system utilized Preoxygenation: Pre-oxygenation with 100% oxygen Induction Type: IV induction Ventilation: Mask ventilation without difficulty Laryngoscope Size: Mac and 3 Tube type: Oral Tube size: 8.5 mm Number of attempts: 1 Airway Equipment and Method: Stylet and Bougie stylet Placement Confirmation: ETT inserted through vocal cords under direct vision,  positive ETCO2 and breath sounds checked- equal and bilateral Secured at: 22 cm Tube secured with: Tape Dental Injury: Teeth and Oropharynx as per pre-operative assessment

## 2020-08-25 NOTE — Op Note (Addendum)
Video Bronchoscopy with Electromagnetic Navigation, radial endobronchial ultrasound for peripheral targeting, fiducial marker placement procedure Note  Date of Operation: 08/25/2020  Pre-op Diagnosis: Right upper lobe lung nodule  Post-op Diagnosis: Right upper lobe lung nodule  Surgeon: Garner Nash, DO  Assistants: None  Anesthesia: General endotracheal anesthesia  Operation: Flexible video fiberoptic bronchoscopy with electromagnetic navigation and biopsies.  Estimated Blood Loss: Minimal  Complications: None  Indications and History: Julie Mann is a 74 y.o. female with right upper lobe lung nodule.  The risks, benefits, complications, treatment options and expected outcomes were discussed with the patient.  The possibilities of pneumothorax, pneumonia, reaction to medication, pulmonary aspiration, perforation of a viscus, bleeding, failure to diagnose a condition and creating a complication requiring transfusion or operation were discussed with the patient who freely signed the consent.    Description of Procedure: The patient was seen in the Preoperative Area, was examined and was deemed appropriate to proceed.  The patient was taken to [facility] OR 9, identified as Julie Mann and the procedure verified as Flexible Video Fiberoptic Bronchoscopy.  A Time Out was held and the above information confirmed.   Prior to the date of the procedure a high-resolution CT scan of the chest was performed. Utilizing [place] a virtual tracheobronchial tree was generated to allow the creation of distinct navigation pathways to the patient's parenchymal abnormalities. After being taken to the operating room general anesthesia was initiated and the patient  was orally intubated. The video fiberoptic bronchoscope was introduced via the endotracheal tube and a general inspection was performed which showed normal right and left lung anatomy with no evidence of endobronchial lesion,  scattered bronchial pitting and bronchiectatic openings of the distal airways. The extendable working channel and locator guide were introduced into the bronchoscope. The distinct navigation pathways prepared prior to this procedure were then utilized to navigate to within 0.8 cm of patient's lesion(s) identified on CT scan.  Local registration was completed with an inspiratory breath-hold APL 20 centimeters water, with fluoroscopic sweep from the 30 degrees RAO to 20 degrees LAO.  The extendable working channel was secured into place and the locator guide was withdrawn.  Following fluoroscopic sweep and local registration we used the Olympus radial endobronchial ultrasound for direct visualization and peripheral targeting of lesion.  Under fluoroscopic guidance transbronchial needle brushings, transbronchial Wang needle biopsies, transbronchial forceps biopsies, crowned triple brush, were performed to be sent for cytology and pathology.  At the end of the specimen collection we transition to fiducial marking placement.  The fiducial delivery kit and catheter was used to place 3 gold super dimension fiducials within 3 separate axial planes of the identified lesion all at approximately 2 cm from the lesional center.  A bronchioalveolar lavage was performed in the right upper lobe and sent for cytology. At the end of the procedure a general airway inspection was performed and there was no evidence of active bleeding.  The therapeutic bronchoscope was used for suctioning and clearance of the bilateral mainstem's from secretion and any remaining blood clots.  The airways were clear at the end.  The bronchoscope was brought to just above the main carina and there was no evidence of active bleeding.  The bronchoscope was removed.  The patient tolerated the procedure well. There was no significant blood loss and there were no obvious complications. A post-procedural chest x-ray is pending.  Samples: 1. Transbronchial  needle brushings from right upper lobe 2. Transbronchial Wang needle biopsies from  right upper lobe 3. Transbronchial forceps biopsies from right upper lobe 4. Bronchoalveolar lavage from right upper lobe 5.  Triple brush specimens from right upper lobe  Plans:  The patient will be discharged from the PACU to home when recovered from anesthesia and after chest x-ray is reviewed. We will review the cytology, pathology results with the patient when they become available. Outpatient followup will be with Garner Nash, DO.   Garner Nash, DO [facility] 08/25/2020 8:54 AM

## 2020-08-25 NOTE — Transfer of Care (Signed)
Immediate Anesthesia Transfer of Care Note  Patient: Julie Mann [facility]  Procedure(s) Performed: VIDEO BRONCHOSCOPY WITH ENDOBRONCHIAL NAVIGATION WITH FIDUCIAL PLACEMENT (Right )  Patient Location: PACU  Anesthesia Type:General  Level of Consciousness: awake, alert  and oriented  Airway & Oxygen Therapy: Patient Spontanous Breathing  Post-op Assessment: Report given to RN and Post -op Vital signs reviewed and stable  Post vital signs: Reviewed and stable  Last Vitals:  Vitals Value Taken Time  BP 121/79 08/25/20 0902  Temp    Pulse 77 08/25/20 0903  Resp 16 08/25/20 0903  SpO2 98 % 08/25/20 0903  Vitals shown include unvalidated device data.  Last Pain:  Vitals:   08/25/20 0629  TempSrc:   PainSc: 0-No pain         Complications: No complications documented.

## 2020-08-25 NOTE — Anesthesia Preprocedure Evaluation (Addendum)
Anesthesia Evaluation  Patient identified by MRN, date of birth, ID band Patient awake    Reviewed: Allergy & Precautions, NPO status , Patient's Chart, lab work & pertinent test results, reviewed documented beta blocker date and time   Airway Mallampati: I  TM Distance: >3 FB Neck ROM: Full    Dental  (+) Chipped,    Pulmonary COPD,  COPD inhaler, former smoker,    breath sounds clear to auscultation       Cardiovascular + dysrhythmias Atrial Fibrillation  Rhythm:Regular Rate:Normal     Neuro/Psych PSYCHIATRIC DISORDERS Anxiety Depression negative neurological ROS     GI/Hepatic negative GI ROS, Neg liver ROS,   Endo/Other  negative endocrine ROS  Renal/GU negative Renal ROS     Musculoskeletal  (+) Arthritis ,   Abdominal Normal abdominal exam  (+)   Peds  Hematology negative hematology ROS (+)   Anesthesia Other Findings   Reproductive/Obstetrics                            Anesthesia Physical Anesthesia Plan  ASA: III  Anesthesia Plan: General   Post-op Pain Management:    Induction: Intravenous  PONV Risk Score and Plan: 4 or greater and Ondansetron and Treatment may vary due to age or medical condition  Airway Management Planned: Oral ETT  Additional Equipment: None  Intra-op Plan:   Post-operative Plan: Extubation in OR  Informed Consent: I have reviewed the patients History and Physical, chart, labs and discussed the procedure including the risks, benefits and alternatives for the proposed anesthesia with the patient or authorized representative who has indicated his/her understanding and acceptance.       Plan Discussed with: CRNA  Anesthesia Plan Comments:        Anesthesia Quick Evaluation

## 2020-08-25 NOTE — Anesthesia Postprocedure Evaluation (Signed)
Anesthesia Post Note  Patient: Meline Russaw [facility]  Procedure(s) Performed: VIDEO BRONCHOSCOPY WITH ENDOBRONCHIAL NAVIGATION WITH FIDUCIAL PLACEMENT (Right )     Patient location during evaluation: PACU Anesthesia Type: General Level of consciousness: awake and alert Pain management: pain level controlled Vital Signs Assessment: post-procedure vital signs reviewed and stable Respiratory status: spontaneous breathing, nonlabored ventilation, respiratory function stable and patient connected to nasal cannula oxygen Cardiovascular status: blood pressure returned to baseline and stable Postop Assessment: no apparent nausea or vomiting Anesthetic complications: no   No complications documented.  Last Vitals:  Vitals:   08/25/20 1002 08/25/20 1018  BP: (!) 133/56 (!) 129/53  Pulse: 66 72  Resp: 16 (!) 22  Temp:  (!) 36.2 C  SpO2: 97% 98%    Last Pain:  Vitals:   08/25/20 1018  TempSrc:   PainSc: 0-No pain                 Effie Berkshire

## 2020-08-25 NOTE — Discharge Instructions (Signed)
Flexible Bronchoscopy, Care After This sheet gives you information about how to care for yourself after your test. Your doctor may also give you more specific instructions. If you have problems or questions, contact your doctor. Follow these instructions at home: Eating and drinking  The day after the test, go back to your normal diet. Driving  Do not drive for 24 hours if you were given a medicine to help you relax (sedative).  Do not drive or use heavy machinery while taking prescription pain medicine. General instructions   Take over-the-counter and prescription medicines only as told by your doctor.  Return to your normal activities as told. Ask what activities are safe for you.  Do not use any products that have nicotine or tobacco in them. This includes cigarettes and e-cigarettes. If you need help quitting, ask your doctor.  Keep all follow-up visits as told by your doctor. This is important. It is very important if you had a tissue sample (biopsy) taken. Get help right away if:  You have shortness of breath that gets worse.  You get light-headed.  You feel like you are going to pass out (faint).  You have chest pain.  You cough up: ? More than a little blood. ? More blood than before. Summary  Do not eat or drink anything (not even water) for 2 hours after your test, or until your numbing medicine wears off.  Do not use cigarettes. Do not use e-cigarettes.  Get help right away if you have chest pain. This information is not intended to replace advice given to you by your health care provider. Make sure you discuss any questions you have with your health care provider. Document Revised: 10/13/2017 Document Reviewed: 11/18/2016 Elsevier Patient Education  2020 [employer].

## 2020-08-26 ENCOUNTER — Encounter (HOSPITAL_COMMUNITY): Payer: Self-pay | Admitting: Pulmonary Disease

## 2020-08-26 LAB — CYTOLOGY - NON PAP

## 2020-08-26 LAB — ACID FAST SMEAR (AFB, MYCOBACTERIA): Acid Fast Smear: NEGATIVE

## 2020-08-27 ENCOUNTER — Telehealth: Payer: Self-pay | Admitting: Pulmonary Disease

## 2020-08-27 NOTE — Telephone Encounter (Signed)
Sounds like some post effects of anesthesia.  Ok to take OTC pain meds, tylenol or ibuprofen for headache.  Small amounts of blood is normal and would just monitor Its common to feel a little fatigued after.  Let us know if anything worsens.  Garner Nash, DO [facility] 08/27/2020 5:45 PM

## 2020-08-27 NOTE — Telephone Encounter (Signed)
Called and spoke with pt who stated she had a bronch performed 10/12. Pt states that she is coughing and states that she is coughing and the phlegm does have some blood in it, which she states she was made aware that it could happen. Pt said the blood is not a lot just mixed in with the mucus today but yesterday 10/13 she was coughing up bright red blood which was just little bits at a time.  Pt said one of her main concerns is that she is very fatigued and also has a headache that will not go away. Pt said she has not taken any meds to see if it would help with her headache as she was unsure if it would be okay for her to take anything for it. Pt also states that she has tightness in her chest. Pt denies any complaints of wheezing and also denies any complaints of fever as her last temp was 97.6.  Pt said that she did take 1/2 of a lorazepam to be able to help her get some sleep.  Asked pt if she has had to use her rescue inhaler and she stated that she is unable to use it due to having an intolerance to it as it made her have blurry vision and also felt very unstable with balance.  Due to symptoms that pt has had since the bronch, Dr. Valeta Harms, please advise.

## 2020-08-28 ENCOUNTER — Telehealth: Payer: Self-pay | Admitting: Pulmonary Disease

## 2020-08-28 DIAGNOSIS — R911 Solitary pulmonary nodule: Secondary | ICD-10-CM

## 2020-08-28 LAB — CULTURE, RESPIRATORY W GRAM STAIN: Culture: NO GROWTH

## 2020-08-28 NOTE — Telephone Encounter (Signed)
PCCM:  I called and spoke with the patient regarding pathology results. Navigational bronchoscopy samples all negative for malignancy.  Recommended 73-month noncontrasted CT follow-up the chest.  Patient can reschedule appointment that was planned for next week to January 2022. Schedule patient to see me after her repeat CT imaging.  CC: Dr. Cheryll Cockayne   Garner Nash, DO [facility] 08/28/2020 5:52 PM

## 2020-08-28 NOTE — Telephone Encounter (Signed)
Spoke with the pt and notified of response per Dr Valeta Harms  Pt verbalized understanding  Nothing further needed  She states feeling much improved today and no more hemoptysis

## 2020-09-14 ENCOUNTER — Ambulatory Visit: Payer: Medicare Other | Admitting: Pulmonary Disease

## 2020-09-15 LAB — CULTURE, FUNGUS WITHOUT SMEAR

## 2020-09-18 ENCOUNTER — Other Ambulatory Visit: Payer: Self-pay

## 2020-09-18 ENCOUNTER — Other Ambulatory Visit: Payer: Self-pay | Admitting: Obstetrics & Gynecology

## 2020-09-18 ENCOUNTER — Encounter: Payer: Self-pay | Admitting: Obstetrics & Gynecology

## 2020-09-18 ENCOUNTER — Ambulatory Visit (INDEPENDENT_AMBULATORY_CARE_PROVIDER_SITE_OTHER): Payer: Medicare Other | Admitting: Obstetrics & Gynecology

## 2020-09-18 VITALS — BP 128/70 | HR 84 | Resp 16 | Ht 66.25 in | Wt 135.0 lb

## 2020-09-18 DIAGNOSIS — M542 Cervicalgia: Secondary | ICD-10-CM

## 2020-09-18 DIAGNOSIS — Z9289 Personal history of other medical treatment: Secondary | ICD-10-CM | POA: Diagnosis not present

## 2020-09-18 DIAGNOSIS — F419 Anxiety disorder, unspecified: Secondary | ICD-10-CM

## 2020-09-18 DIAGNOSIS — Z85048 Personal history of other malignant neoplasm of rectum, rectosigmoid junction, and anus: Secondary | ICD-10-CM | POA: Diagnosis not present

## 2020-09-18 MED ORDER — IBUPROFEN 600 MG PO TABS: 600.0000 mg | ORAL_TABLET | Freq: Every day | ORAL | 2 refills | Status: DC | PRN

## 2020-09-18 MED ORDER — LORAZEPAM 1 MG PO TABS: 0.5000 mg | ORAL_TABLET | Freq: Every day | ORAL | 2 refills | Status: DC

## 2020-09-18 NOTE — Progress Notes (Signed)
74 y.o. [id number] Married White or Caucasian female here for breast and pelvic exam.  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  Denies vaginal bleeding.  She had a lung nodule that has been followed conservatively.  The lung nodule had increased in size.  Saw Dr. Kipp Brood with CT surgery.  Referred to Dr. Valeta Harms for bronchoscopy and biopsy.  Biopsy showed no malignancy.  She will have a CT again in 3 months.  Dr. Valeta Harms will handle this.    Patient's last menstrual period was 11/14/1992.          Sexually active: No.  H/O STD:  no  Health Maintenance: PCP:  Lajean Manes, MD.  Last wellness appt was 2020 per patient.  Did blood work at that appt:  Yes  Vaccines are up to date:  No, has not gotten the Covid booster Colonoscopy:  2016 f/u 5 years.  Pt aware she is due.   MMG:  08/10/20 BIRADS 1 negative/density b BMD:  12/18/17 Osteopenia Last pap smear:  07/25/19 Negative.   H/o abnormal pap smear:  No   reports that she quit smoking about 16 years ago. Her smoking use included cigarettes. She has a 30.00 pack-year smoking history. She has never used smokeless tobacco. She reports that she does not drink alcohol and does not use drugs.  Past Medical History:  Diagnosis Date  . 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 Anxiety   . Arthritis    "hands, fingers, back" (02/27/2014)  . Atrial fibrillation ([place])   . Cat scratch of right hand 02/24/2014  . COPD (chronic obstructive pulmonary disease) ([place])   . Dyspnea    walking up hill;.  stairs (If Iam tired)  . Dysrhythmia    a fib  . Fall 01/2018  . Fall from horse 01/2018  . Fracture, ankle 02/2019   Right   . High cholesterol   . History of stomach ulcers ~ 1966  . Osteopenia     Past Surgical History:  Procedure Laterality Date  . ANUS SURGERY  2005   "biopsy"  . BREAST CYST EXCISION Right 1966  . DILATION AND CURETTAGE OF UTERUS  1980's    S/P miscarriage  . EXTERNAL FIXATION LEG Right 03/10/2019   Procedure: OPEN REDUCTION INTERNAL FIXATION RIGHT ANKLE;  Surgeon: Marchia Bond, MD;  Location: [place];  Service: Orthopedics;  Laterality: Right;  Marland Kitchen VARICOSE VEIN SURGERY Left    left leg  . VIDEO BRONCHOSCOPY WITH ENDOBRONCHIAL NAVIGATION Right 08/25/2020   Procedure: VIDEO BRONCHOSCOPY WITH ENDOBRONCHIAL NAVIGATION WITH FIDUCIAL PLACEMENT;  Surgeon: Garner Nash, DO;  Location: [place];  Service: Pulmonary;  Laterality: Right;    Current Outpatient Medications  Medication Sig Dispense Refill  . aspirin EC 81 MG tablet Take 1 tablet (81 mg total) by mouth daily. 90 tablet 3  . atorvastatin (LIPITOR) 10 MG tablet Take 10 mg by mouth 2 (two) times a week. Monday and thursday  12  . ibuprofen (ADVIL) 600 MG tablet TAKE 1 TABLET BY MOUTH EVERY 8 HOURS AS NEEDED FOR HEADACHE OR MILD PAIN (Patient taking differently: Take 600 mg by mouth daily as needed for moderate pain. ) 30 tablet 1  . LORazepam (ATIVAN) 1 MG tablet TAKE 1/2-1 TABLET BY MOUTH NO MORE THAN ONCE DAILY AS NEEDED FOR ANXIETY (Patient taking differently: Take 0.5 mg by mouth at bedtime. ) 30 tablet 0  .  metoprolol tartrate (LOPRESSOR) 25 MG tablet Take 25 mg by mouth daily as needed (palpitations/AFIB).     . Multiple Vitamins-Minerals (MULTIVITAMIN PO) Take 1 tablet by mouth daily.    Marland Kitchen OVER THE COUNTER MEDICATION Take 15 mLs by mouth daily. acidophilus    . OVER THE COUNTER MEDICATION Take 15 mLs by mouth 3 (three) times daily with meals. Liquid Acidophilus    . Propylene Glycol-Glycerin (SOOTHE) 0.6-0.6 % SOLN Place 1 drop into both eyes in the morning and at bedtime.     Marland Kitchen albuterol (VENTOLIN HFA) 108 (90 Base) MCG/ACT inhaler Inhale 2 puffs into the lungs every 6 (six) hours as needed for wheezing or shortness of breath. (Patient not taking: Reported on 08/18/2020) 8 g 6   No current facility-administered medications for this visit.    Family History  Problem  Relation Age of Onset  . Breast cancer Maternal Aunt   . Breast cancer Maternal Grandmother   . Heart disease Mother   . Stroke Father   . Rectal cancer Paternal Grandmother   . Breast cancer Sister 29  . Breast cancer Sister 71    Review of Systems  All other systems reviewed and are negative.   Exam:   BP 128/70 (BP Location: Right Arm, Patient Position: Sitting, Cuff Size: Normal)   Pulse 84   Resp 16   Ht 5' 6.25" (1.683 m)   Wt 135 lb (61.2 kg)   LMP 11/14/1992   BMI 21.63 kg/m   Height: 5' 6.25" (168.3 cm)  General appearance: alert, cooperative and appears stated age Breasts: normal appearance, no masses or tenderness Abdomen: soft, non-tender; bowel sounds normal; no masses,  no organomegaly Lymph nodes: Cervical, supraclavicular, and axillary nodes normal.  No abnormal inguinal nodes palpated Neurologic: Grossly normal  Pelvic: External genitalia:  no lesions              Urethra:  normal appearing urethra with no masses, tenderness or lesions              Bartholins and Skenes: normal                 Vagina: normal appearing vagina with normal color and discharge, no lesions              Cervix: no lesions              Pap taken: No. Bimanual Exam:  Uterus:  normal size, contour, position, consistency, mobility, non-tender              Adnexa: no mass, fullness, tenderness               Rectovaginal: Confirms               Anus:  normal sphincter tone, no lesions  Chaperone, Terence Lux, CMA, was present for exam.  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 Felipa Eth Lung nodule, being followed by Dr. Valeta Harms Paroxysmal a fib followed by Dr. 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  She is  doing yearly. pap smear not obtained.  I have been doing yearly pap smear due to her anal cancer but really feel she does not need this yearly as it's been >15 years since her  diagnosis Rs for motrin 600mg  every 6 hours as needed.  #30.  smears but she's never had an abnormal return Ativan 0.5mg  1 tab every day prn insomnia/anxiety.  #30/2RF Pt is clearly aware her colonoscopy is overdue.  States she will get it done this next year. Follow up 1 year for breast and pelvic exam  24 minutes of total time was spent for this patient encounter, including preparation, face-to-face counseling with the patient and coordination of care, and documentation of the encounter.

## 2020-10-05 ENCOUNTER — Ambulatory Visit: Payer: Medicare Other | Admitting: Orthopaedic Surgery

## 2020-10-05 ENCOUNTER — Encounter: Payer: Self-pay | Admitting: Orthopaedic Surgery

## 2020-10-05 ENCOUNTER — Ambulatory Visit: Payer: Self-pay

## 2020-10-05 VITALS — Ht 66.0 in | Wt 135.0 lb

## 2020-10-05 DIAGNOSIS — M542 Cervicalgia: Secondary | ICD-10-CM | POA: Diagnosis not present

## 2020-10-05 MED ORDER — LIDOCAINE HCL 1 % IJ SOLN
1.0000 mL | INTRAMUSCULAR | Status: AC | PRN
  Administered 2020-10-05: 1 mL

## 2020-10-05 MED ORDER — METHYLPREDNISOLONE ACETATE 40 MG/ML IJ SUSP
40.0000 mg | INTRAMUSCULAR | Status: AC | PRN
  Administered 2020-10-05: 40 mg via INTRAMUSCULAR

## 2020-10-05 NOTE — Progress Notes (Signed)
Office Visit Note   Patient: Julie Mann           Date of Birth: 11-30-1945           MRN: [id number] Visit Date: 10/05/2020              Requested by: Lajean Manes, MD [address] [place],  [place] [address] PCP: Lajean Manes, MD   Assessment & Plan: Visit Diagnoses:  1. Neck pain     Plan: Bhavika is been experiencing some pain along the left levator scapular muscle.  He does have a history of left shoulder pain with some degenerative change of the glenohumeral joint.  She has had a prior arthroscopic debridement and on occasion I have injected the glenohumeral joint with relief of her pain.  However on this occasion the problem seems to be related to her neck with referred pain to the levator scapular.  I am going to inject the areas of trigger point tenderness.  She had x-rays demonstrating degenerative changes at C5-6 and C6-7.  She does not have any numbness or tingling.  Also try a course of physical therapy and have her return in 4 to 6 weeks if no improvement.  Follow-Up Instructions: Return if symptoms worsen or fail to improve.   Orders:  Orders Placed This Encounter  Procedures  . Trigger Point Inj  . XR Cervical Spine 2 or 3 views  . Ambulatory referral to Physical Therapy   No orders of the defined types were placed in this encounter.     Procedures: Trigger Point Inj  Date/Time: 10/05/2020 10:18 AM Performed by: Garald Balding, MD Authorized by: Garald Balding, MD   Consent Given by:  Patient Indications:  Pain Total # of Trigger Points:  1 Location: neck   Needle Size:  27 G Approach:  Dorsal Medications #1:  1 mL lidocaine 1 %; 40 mg methylPREDNISolone acetate 40 MG/ML     Clinical Data: No additional findings.   Subjective: Chief Complaint  Patient presents with  . Left Shoulder - Follow-up  Patient presents today for her left shoulder. She was last here on 07/16/2020 and received a cortisone injection. She said  that her shoulder never really stops hurting completely. She is wanting to get another cortisone injection today. She does not take anything for pain.  She actually is having more problem with pain in the area of this levator scapular muscle.  She marked an area along the left paracervical region.  Not had any numbness or tingling.  She still able to play the violin and relates that on this occasion she thinks it is probably more related to her neck than her shoulder  HPI  Review of Systems   Objective: Vital Signs: Ht 5\' 6"  (1.676 m)   Wt 135 lb (61.2 kg)   LMP 11/14/1992   BMI 21.79 kg/m   Physical Exam Constitutional:      Appearance: She is well-developed.  Eyes:     Pupils: Pupils are equal, round, and reactive to light.  Pulmonary:     Effort: Pulmonary effort is normal.  Skin:    General: Skin is warm and dry.  Neurological:     Mental Status: She is alert and oriented to person, place, and time.  Psychiatric:        Behavior: Behavior normal.     Ortho Exam awake alert and oriented x3.  Comfortable sitting.  No acute distress.  She did  not have any pain with range of motion of her left shoulder and actually had quick overhead motion.  No popping or clicking.  She does have some areas of trigger point tenderness of the left levator scapula muscle.  There is very mild loss of motion of the cervical spine Specialty Comments:  No specialty comments available.  Imaging: XR Cervical Spine 2 or 3 views  Result Date: 10/05/2020 Films of the cervical spine were obtained in 2 projections.  There is degenerative change at C5-6 and C6-7 with disc space narrowing and small posterior osteophytes.  There are areas of facet sclerosis at the same 2 levels.  No listhesis.  No curvature of the cervical spine.  No evidence of any acute change    PMFS History: Patient Active Problem List   Diagnosis Date Noted  . Neck pain 10/05/2020  . Nodule of upper lobe of right lung 08/17/2020    . Biceps tendonosis of left shoulder 02/13/2020  . Impingement syndrome of left shoulder 02/13/2020  . Post-operative state 02/13/2020  . Ankle dislocation, right, initial encounter 03/10/2019  . Trimalleolar fracture of ankle, closed, right, initial encounter   . Chronic left shoulder pain 02/19/2019  . Anxiety 12/17/2016  . 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[place]) 12/17/2016  . Osteopenia 12/17/2016  . Paroxysmal atrial fibrillation ([place]) 12/17/2016  . Pure hypercholesterolemia 12/17/2016  . Rectal cancer ([place]) 07/30/2015   Past Medical History:  Diagnosis Date  . 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 Anxiety   . Arthritis    "hands, fingers, back" (02/27/2014)  . Atrial fibrillation ([place])   . Cat scratch of right hand 02/24/2014  . COPD (chronic obstructive pulmonary disease) ([place])   . Dyspnea    walking up hill;.  stairs (If Iam tired)  . Dysrhythmia    a fib  . Fall from horse 01/2018  . Fracture, ankle 02/2019   Right   . High cholesterol   . History of stomach ulcers ~ 1966  . Osteopenia     Family History  Problem Relation Age of Onset  . Breast cancer Maternal Aunt   . Breast cancer Maternal Grandmother   . Heart disease Mother   . Stroke Father   . Rectal cancer Paternal Grandmother   . Breast cancer Sister 67  . Breast cancer Sister 24    Past Surgical History:  Procedure Laterality Date  . ANUS SURGERY  2005   "biopsy"  . BREAST CYST EXCISION Right 1966  . DILATION AND CURETTAGE OF UTERUS  1980's   S/P miscarriage  . EXTERNAL FIXATION LEG Right 03/10/2019   Procedure: OPEN REDUCTION INTERNAL FIXATION RIGHT ANKLE;  Surgeon: Marchia Bond, MD;  Location: [facility];  Service: Orthopedics;  Laterality: Right;  Marland Kitchen VARICOSE VEIN SURGERY Left    left leg  . VIDEO BRONCHOSCOPY WITH ENDOBRONCHIAL NAVIGATION Right 08/25/2020   Procedure: VIDEO BRONCHOSCOPY WITH ENDOBRONCHIAL NAVIGATION WITH FIDUCIAL PLACEMENT;  Surgeon: Garner Nash, DO;   Location: [facility];  Service: Pulmonary;  Laterality: Right;   Social History   Occupational History  . Not on file  Tobacco Use  . Smoking status: Former Smoker    Packs/day: 1.00    Years: 30.00    Pack years: 30.00    Types: Cigarettes    Quit date: 2005    Years since quitting: 16.9  . Smokeless tobacco: Never Used  Vaping Use  . Vaping Use: Never used  Substance and Sexual Activity  .  Alcohol use: No  . Drug use: No  . Sexual activity: Not Currently    Partners: Male    Birth control/protection: Post-menopausal

## 2020-10-09 LAB — ACID FAST CULTURE WITH REFLEXED SENSITIVITIES (MYCOBACTERIA): Acid Fast Culture: NEGATIVE

## 2020-10-14 ENCOUNTER — Other Ambulatory Visit (HOSPITAL_COMMUNITY): Payer: Self-pay | Admitting: Internal Medicine

## 2020-10-14 ENCOUNTER — Ambulatory Visit: Payer: Medicare Other | Attending: Internal Medicine

## 2020-10-14 DIAGNOSIS — Z23 Encounter for immunization: Secondary | ICD-10-CM

## 2020-10-14 NOTE — Progress Notes (Signed)
   Covid-19 Vaccination Clinic  Name:  Julie Mann    MRN: [id number] DOB: 10/17/1946  10/14/2020  Ms. Julie Mann was observed post Covid-19 immunization for 15 minutes without incident. She was provided with Vaccine Information Sheet and instruction to access the V-Safe system.   Ms. Julie Mann was instructed to call 911 with any severe reactions post vaccine: Marland Kitchen Difficulty breathing  . Swelling of face and throat  . A fast heartbeat  . A bad rash all over body  . Dizziness and weakness   Immunizations Administered    No immunizations on file.

## 2020-10-21 ENCOUNTER — Ambulatory Visit: Payer: Medicare Other | Admitting: Physical Therapy

## 2020-10-21 ENCOUNTER — Encounter: Payer: Self-pay | Admitting: Physical Therapy

## 2020-10-21 ENCOUNTER — Other Ambulatory Visit: Payer: Self-pay

## 2020-10-21 DIAGNOSIS — M6281 Muscle weakness (generalized): Secondary | ICD-10-CM | POA: Diagnosis not present

## 2020-10-21 DIAGNOSIS — M542 Cervicalgia: Secondary | ICD-10-CM | POA: Diagnosis not present

## 2020-10-21 DIAGNOSIS — M25512 Pain in left shoulder: Secondary | ICD-10-CM

## 2020-10-21 DIAGNOSIS — G8929 Other chronic pain: Secondary | ICD-10-CM | POA: Diagnosis not present

## 2020-10-21 NOTE — Therapy (Signed)
[facility] [place], [place], [address] Phone: [phone]   Fax:  [phone]  Physical Therapy Evaluation  Patient Details  Name: Julie Mann MRN: [id number] Date of Birth: 03-06-1946 Referring Provider (PT): Joni Fears, MD,    Encounter Date: 10/21/2020   PT End of Session - 10/21/20 1243    Visit Number 1    Number of Visits 6    Date for PT Re-Evaluation 12/02/20    PT Start Time 1950    PT Stop Time 1225    PT Time Calculation (min) 40 min    Activity Tolerance Patient tolerated treatment well    Behavior During Therapy [facility] for tasks assessed/performed           Past Medical History:  Diagnosis Date  . 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 Anxiety   . Arthritis    "hands, fingers, back" (02/27/2014)  . Atrial fibrillation ([place])   . Cat scratch of right hand 02/24/2014  . COPD (chronic obstructive pulmonary disease) ([place])   . Dyspnea    walking up hill;.  stairs (If Iam tired)  . Dysrhythmia    a fib  . Fall from horse 01/2018  . Fracture, ankle 02/2019   Right   . High cholesterol   . History of stomach ulcers ~ 1966  . Osteopenia     Past Surgical History:  Procedure Laterality Date  . ANUS SURGERY  2005   "biopsy"  . BREAST CYST EXCISION Right 1966  . DILATION AND CURETTAGE OF UTERUS  1980's   S/P miscarriage  . EXTERNAL FIXATION LEG Right 03/10/2019   Procedure: OPEN REDUCTION INTERNAL FIXATION RIGHT ANKLE;  Surgeon: Marchia Bond, MD;  Location: [place];  Service: Orthopedics;  Laterality: Right;  Marland Kitchen VARICOSE VEIN SURGERY Left    left leg  . VIDEO BRONCHOSCOPY WITH ENDOBRONCHIAL NAVIGATION Right 08/25/2020   Procedure: VIDEO BRONCHOSCOPY WITH ENDOBRONCHIAL NAVIGATION WITH FIDUCIAL PLACEMENT;  Surgeon: Garner Nash, DO;  Location: [facility];  Service: Pulmonary;  Laterality: Right;    There were no vitals filed for this visit.    Subjective Assessment - 10/21/20  1147    Subjective Pt is a 74 y/o female who returns to [facility] for Lt sided neck/shoulder pain x 9 months.  She had surgery on Lt shoulder approx 9 months ago and recovered well from this, but feels this area has always been painful since surgery.    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 Yes    Pain Score 1    at best 1/10, up to 4/10   Pain Location Scapula    Pain Orientation Left    Pain Descriptors / Indicators Aching;Discomfort    Pain Type Chronic pain    Pain Onset More than a month ago    Pain Frequency Intermittent    Aggravating Factors  overuse, yardwork/playing violin    Pain Relieving Factors rest              [facility] PT Assessment - 10/21/20 1153      Assessment   Medical Diagnosis M54.2 (ICD-10-CM) - Neck pain    Referring Provider (PT) Joni Fears, MD,     Onset Date/Surgical Date --   9 months ago   Hand Dominance Right    Prior Therapy extensive PT at this  clinic      Precautions   Precautions None      Restrictions   Weight Bearing Restrictions No      Balance Screen   Has the patient fallen in the past 6 months No    Has the patient had a decrease in activity level because of a fear of falling?  No    Is the patient reluctant to leave their home because of a fear of falling?  No      Home Ecologist residence    Living Arrangements Spouse/significant other      Prior Function   Level of Independence Independent    Vocation Part time employment    Vocation Requirements violinist    Leisure violin, daily walking      Cognition   Overall Cognitive Status Within Functional Limits for tasks assessed      Observation/Other Assessments   Focus on Therapeutic Outcomes (FOTO)  67 (predicted 66)      ROM / Strength   AROM / PROM / Strength AROM;Strength      AROM   AROM Assessment Site Cervical    Cervical  Flexion 58    Cervical Extension 42    Cervical - Right Side Bend 28    Cervical - Left Side Bend 20   tightness   Cervical - Right Rotation 70    Cervical - Left Rotation 75      Strength   Strength Assessment Site Shoulder    Right/Left Shoulder Right;Left    Right Shoulder Flexion 5/5    Right Shoulder ABduction 4/5    Right Shoulder Internal Rotation 5/5    Right Shoulder External Rotation 5/5    Left Shoulder Flexion 4+/5    Left Shoulder ABduction 4/5    Left Shoulder Internal Rotation 5/5    Left Shoulder External Rotation 4-/5      Palpation   Palpation comment active trigger points Lt levator and upper trap                      Objective measurements completed on examination: See above findings.       [facility] PT Treatment/Exercise - 10/21/20 1241      Exercises   Exercises Other Exercises    Other Exercises  performed 1 rep of UT and levator stretch x 30 sec hold      Manual Therapy   Manual therapy comments STM and compression to Lt upper trap and levator scapula            Trigger Point Dry Needling - 10/21/20 1242    Consent Given? Yes    Education Handout Provided Yes    Muscles Treated Head and Neck Upper trapezius;Levator scapulae    Upper Trapezius Response Twitch reponse elicited    Levator Scapulae Response Twitch response elicited                PT Education - 10/21/20 1242    Education Details HEP, DN    Person(s) Educated Patient    Methods Explanation    Comprehension Verbalized understanding               PT Long Term Goals - 10/21/20 1246      PT LONG TERM GOAL #1   Title independent with HEP    Status New    Target Date 12/02/20      PT LONG TERM GOAL #2   Title  demonstrate 4/5 Lt shoulder abduction strength for improved function    Status New    Target Date 12/02/20      PT LONG TERM GOAL #3   Title FOTO score maintained    Status New    Target Date 12/02/20      PT LONG TERM GOAL #4    Title report pain < 3/10 with playing violin for improved function    Status New    Target Date 12/02/20      PT LONG TERM GOAL #5   Title n/a                  Plan - 10/21/20 1243    Clinical Impression Statement Pt is a 74 y/o female well known to this PT clinic who returns to [place] for Lt sided neck and shoulder pain.  She has active trigger points in her upper trap and levator scapula treated with manual therapy and DN today.  Discussed resuming abduction strengthening exercises at home to work on building shoulder strength and added stretches to HEP.  Will benefit from PT to address deficits listed.    Personal Factors and Comorbidities Comorbidity 3+;Past/Current Experience;Profession;Time since onset of injury/illness/exacerbation    Examination-Activity Limitations Reach Overhead;Lift    Examination-Participation Restrictions Occupation    Stability/Clinical Decision Making Stable/Uncomplicated    Clinical Decision Making Low    Rehab Potential Good    PT Frequency 1x / week    PT Duration 6 weeks    PT Treatment/Interventions ADLs/Self Care Home Management;Cryotherapy;Electrical Stimulation;Iontophoresis 4mg /ml Dexamethasone;Moist Heat;Therapeutic exercise;Therapeutic activities;Ultrasound;Neuromuscular re-education;Patient/family education;Manual techniques;Taping;Dry needling    PT Next Visit Plan review stretches, assess response to DN, review shoulder HEP and add exercises as needed    PT Home Exercise Plan Access Code: [email or web address]    Consulted and Agree with Plan of Care Patient           Patient will benefit from skilled therapeutic intervention in order to improve the following deficits and impairments:  Increased fascial restricitons, Increased muscle spasms, Pain, Decreased strength  Visit Diagnosis: Cervicalgia - Plan: PT plan of care cert/re-cert  Chronic left shoulder pain - Plan: PT plan of care cert/re-cert  Muscle weakness (generalized) - Plan: PT plan  of care cert/re-cert     Problem List Patient Active Problem List   Diagnosis Date Noted  . Neck pain 10/05/2020  . Nodule of upper lobe of right lung 08/17/2020  . Biceps tendonosis of left shoulder 02/13/2020  . Impingement syndrome of left shoulder 02/13/2020  . Post-operative state 02/13/2020  . Ankle dislocation, right, initial encounter 03/10/2019  . Trimalleolar fracture of ankle, closed, right, initial encounter   . Chronic left shoulder pain 02/19/2019  . Anxiety 12/17/2016  . 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[place]) 12/17/2016  . Osteopenia 12/17/2016  . Paroxysmal atrial fibrillation ([place]) 12/17/2016  . Pure hypercholesterolemia 12/17/2016  . Rectal cancer ([place]) 07/30/2015      Laureen Abrahams, PT, DPT 10/21/20 12:49 PM   [facility] [address] [place], [place], [address] Phone: [phone]   Fax:  [phone]  Name: Julie Mann MRN: [id number] Date of Birth: 04-Jul-1946

## 2020-10-21 NOTE — Patient Instructions (Signed)
Access Code: [email or web address] URL: [email or web address] Date: 10/21/2020 Prepared by: Faustino Congress  Exercises Seated Upper Trapezius Stretch - 2 x daily - 7 x weekly - 3 reps - 1 sets - 30 sec hold Seated Levator Scapulae Stretch - 2 x daily - 7 x weekly - 1 reps - 1 sets - 30 sec hold  Patient Education Trigger Point Dry Needling

## 2020-11-04 ENCOUNTER — Ambulatory Visit: Payer: Medicare Other | Admitting: Physical Therapy

## 2020-11-04 ENCOUNTER — Encounter: Payer: Self-pay | Admitting: Physical Therapy

## 2020-11-04 ENCOUNTER — Other Ambulatory Visit: Payer: Self-pay

## 2020-11-04 DIAGNOSIS — M25512 Pain in left shoulder: Secondary | ICD-10-CM

## 2020-11-04 DIAGNOSIS — G8929 Other chronic pain: Secondary | ICD-10-CM | POA: Diagnosis not present

## 2020-11-04 DIAGNOSIS — M6281 Muscle weakness (generalized): Secondary | ICD-10-CM

## 2020-11-04 DIAGNOSIS — M542 Cervicalgia: Secondary | ICD-10-CM

## 2020-11-04 NOTE — Therapy (Signed)
[place], [place], [address] Phone: [phone]   Fax:  [phone]  Physical Therapy Treatment  Patient Details  Name: Julie Mann MRN: [id number] Date of Birth: [birth year] Referring Provider (PT): Joni Fears, MD,    Encounter Date: 11/04/2020   PT End of Session - 11/04/20 1503    Visit Number 2    Number of Visits 6    Date for PT Re-Evaluation 12/02/20    PT Start Time [id number]    PT Stop Time 1500    PT Time Calculation (min) 32 min    Activity Tolerance Patient tolerated treatment well    Behavior During Therapy [facility] for tasks assessed/performed           Past Medical History:  Diagnosis Date  . 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 Anxiety   . Arthritis    "hands, fingers, back" (02/27/2014)  . Atrial fibrillation ([place])   . Cat scratch of right hand 02/24/2014  . COPD (chronic obstructive pulmonary disease) ([place])   . Dyspnea    walking up hill;.  stairs (If Iam tired)  . Dysrhythmia    a fib  . Fall from horse 01/2018  . Fracture, ankle 02/2019   Right   . High cholesterol   . History of stomach ulcers ~ 1966  . Osteopenia     Past Surgical History:  Procedure Laterality Date  . ANUS SURGERY  2005   "biopsy"  . BREAST CYST EXCISION Right 1966  . DILATION AND CURETTAGE OF UTERUS  1980's   S/P miscarriage  . EXTERNAL FIXATION LEG Right 03/10/2019   Procedure: OPEN REDUCTION INTERNAL FIXATION RIGHT ANKLE;  Surgeon: Marchia Bond, MD;  Location: [facility];  Service: Orthopedics;  Laterality: Right;  Marland Kitchen VARICOSE VEIN SURGERY Left    left leg  . VIDEO BRONCHOSCOPY WITH ENDOBRONCHIAL NAVIGATION Right 08/25/2020   Procedure: VIDEO BRONCHOSCOPY WITH ENDOBRONCHIAL NAVIGATION WITH FIDUCIAL PLACEMENT;  Surgeon: Garner Nash, DO;  Location: [facility];  Service: Pulmonary;  Laterality: Right;    There were no vitals filed for this visit.   Subjective Assessment - 11/04/20  1430    Subjective has been really busy with work, doesn't feel like the DN was helpful.  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 Yes    Pain Score 3     Pain Location Scapula    Pain Orientation Left    Pain Descriptors / Indicators Aching;Discomfort    Pain Type Chronic pain    Pain Onset More than a month ago    Pain Frequency Intermittent    Aggravating Factors  overuse, yardwork/playing violin    Pain Relieving Factors rest                             [facility] Adult PT Treatment/Exercise - 11/04/20 1502      Exercises   Exercises Other Exercises    Other Exercises  see pt instructions -performed 5-10 reps of each exercise with mod cues for techniques                  PT Education - 11/04/20 1503    Education  Details HEP    Person(s) Educated Patient    Methods Explanation;Demonstration;Handout    Comprehension Verbalized understanding;Returned demonstration               PT Long Term Goals - 10/21/20 1246      PT LONG TERM GOAL #1   Title independent with HEP    Status New    Target Date 12/02/20      PT LONG TERM GOAL #2   Title demonstrate 4/5 Lt shoulder abduction strength for improved function    Status New    Target Date 12/02/20      PT LONG TERM GOAL #3   Title FOTO score maintained    Status New    Target Date 12/02/20      PT LONG TERM GOAL #4   Title report pain < 3/10 with playing violin for improved function    Status New    Target Date 12/02/20      PT LONG TERM GOAL #5   Title n/a                 Plan - 11/04/20 1504    Clinical Impression Statement Pt reported no benefit from DN and declined further sessions of DN at this time.  Overall pain remains low, and requested additional exercises today to work on  strengthening and stretching.  Issued new HEP today as she is very compliant with her program, and will benefit from PT to maximize function.    Personal Factors and Comorbidities Comorbidity 3+;Past/Current Experience;Profession;Time since onset of injury/illness/exacerbation    Examination-Activity Limitations Reach Overhead;Lift    Examination-Participation Restrictions Occupation    Stability/Clinical Decision Making Stable/Uncomplicated    Rehab Potential Good    PT Frequency 1x / week    PT Duration 6 weeks    PT Treatment/Interventions ADLs/Self Care Home Management;Cryotherapy;Electrical Stimulation;Iontophoresis 4mg /ml Dexamethasone;Moist Heat;Therapeutic exercise;Therapeutic activities;Ultrasound;Neuromuscular re-education;Patient/family education;Manual techniques;Taping;Dry needling    PT Next Visit Plan review stretches, review shoulder HEP and add exercises as needed, ?d/c or hold    PT Home Exercise Plan Access Code: [email or web address]    Consulted and Agree with Plan of Care Patient           Patient will benefit from skilled therapeutic intervention in order to improve the following deficits and impairments:  Increased fascial restricitons,Increased muscle spasms,Pain,Decreased strength  Visit Diagnosis: Cervicalgia  Chronic left shoulder pain  Muscle weakness (generalized)     Problem List Patient Active Problem List   Diagnosis Date Noted  . Neck pain 10/05/2020  . Nodule of upper lobe of right lung 08/17/2020  . Biceps tendonosis of left shoulder 02/13/2020  . Impingement syndrome of left shoulder 02/13/2020  . Post-operative state 02/13/2020  . Ankle dislocation, right, initial encounter 03/10/2019  . Trimalleolar fracture of ankle, closed, right, initial encounter   . Chronic left shoulder pain 02/19/2019  . Anxiety 12/17/2016  . 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[place]) 12/17/2016  . Osteopenia 12/17/2016  . Paroxysmal atrial fibrillation ([place]) 12/17/2016  . Pure  hypercholesterolemia 12/17/2016  . Rectal cancer ([place]) 07/30/2015     Laureen Abrahams, PT, DPT 11/04/20 3:06 PM    [facility] [address] [place], [place], [address] Phone: [phone]   Fax:  [phone]  Name: Julie Mann MRN: [id number] Date of Birth: 04-Feb-1946

## 2020-11-04 NOTE — Patient Instructions (Signed)
Access Code: [email or web address] URL: [email or web address] Date: 11/04/2020 Prepared by: Faustino Congress  Exercises Seated Upper Trapezius Stretch - 2 x daily - 7 x weekly - 3 reps - 1 sets - 30 sec hold Seated Levator Scapulae Stretch - 2 x daily - 7 x weekly - 1 reps - 1 sets - 30 sec hold Standing Single Arm Shoulder Abduction with Dumbbell - Thumb Up - 1 x daily - 7 x weekly - 3 sets - 10 reps Single Arm Bent Over Shoulder Horizontal Abduction with Dumbbell - Thumb Up - 1 x daily - 7 x weekly - 3 sets - 10 reps Single Arm Shoulder Flexion with Dumbbell - 1 x daily - 7 x weekly - 3 sets - 10 reps Horizontal Shoulder Abduction and Adduction with Compression Garment - 1 x daily - 7 x weekly - 3 sets - 10 reps Bent Over Single Arm Shoulder Row with Dumbbell - 1 x daily - 7 x weekly - 3 sets - 10 reps  Patient Education Trigger Point Dry Needling

## 2020-11-09 ENCOUNTER — Encounter: Payer: Medicare Other | Admitting: Physical Therapy

## 2020-11-22 NOTE — Progress Notes (Signed)
Cardiology Office Note:    Date:  11/23/2020   ID:  Julie, Mann 1946/08/24, MRN [id number]  PCP:  Lajean Manes, MD  Cardiologist:  Sinclair Grooms, MD   Referring MD: Garald Balding, MD   Chief Complaint  Patient presents with  . Atrial Fibrillation    History of Present Illness:    Julie Mann is a 75 y.o. female with a hx of  paroxysmal atrial fibrillation and CHADS VASC score  3 (female, age) and refuses anticoagulation (Eliquis and Xarelto previously used).Marland Kitchen   Sparse episodes of atrial fib.  These tend to last less than an hour.  She made have 3 episodes that she can feel per year.  Previously tried anticoagulation therapy but she self discontinued because of side effects and eventually decided against any additional attempts.  Past Medical History:  Diagnosis Date  . 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 Anxiety   . Arthritis    "hands, fingers, back" (02/27/2014)  . Atrial fibrillation ([place])   . Cat scratch of right hand 02/24/2014  . COPD (chronic obstructive pulmonary disease) ([place])   . Dyspnea    walking up hill;.  stairs (If Iam tired)  . Dysrhythmia    a fib  . Fall from horse 01/2018  . Fracture, ankle 02/2019   Right   . High cholesterol   . History of stomach ulcers ~ 1966  . Osteopenia     Past Surgical History:  Procedure Laterality Date  . ANUS SURGERY  2005   "biopsy"  . BREAST CYST EXCISION Right 1966  . DILATION AND CURETTAGE OF UTERUS  1980's   S/P miscarriage  . EXTERNAL FIXATION LEG Right 03/10/2019   Procedure: OPEN REDUCTION INTERNAL FIXATION RIGHT ANKLE;  Surgeon: Marchia Bond, MD;  Location: [facility];  Service: Orthopedics;  Laterality: Right;  Marland Kitchen VARICOSE VEIN SURGERY Left    left leg  . VIDEO BRONCHOSCOPY WITH ENDOBRONCHIAL NAVIGATION Right 08/25/2020   Procedure: VIDEO BRONCHOSCOPY WITH ENDOBRONCHIAL NAVIGATION WITH FIDUCIAL PLACEMENT;  Surgeon: Garner Nash, DO;  Location: [facility];   Service: Pulmonary;  Laterality: Right;    Current Medications: Current Meds  Medication Sig  . albuterol (VENTOLIN HFA) 108 (90 Base) MCG/ACT inhaler Inhale 2 puffs into the lungs every 6 (six) hours as needed for wheezing or shortness of breath.  Marland Kitchen aspirin EC 81 MG tablet Take 1 tablet (81 mg total) by mouth daily.  Marland Kitchen atorvastatin (LIPITOR) 10 MG tablet Take 10 mg by mouth 2 (two) times a week. Monday and thursday  . ibuprofen (ADVIL) 600 MG tablet Take 1 tablet (600 mg total) by mouth daily as needed for moderate pain.  Marland Kitchen LORazepam (ATIVAN) 1 MG tablet Take 0.5 tablets (0.5 mg total) by mouth at bedtime.  . metoprolol tartrate (LOPRESSOR) 25 MG tablet Take 25 mg by mouth daily as needed (palpitations/AFIB).   . Multiple Vitamins-Minerals (MULTIVITAMIN PO) Take 1 tablet by mouth daily.  Marland Kitchen OVER THE COUNTER MEDICATION Take 15 mLs by mouth daily. acidophilus  . OVER THE COUNTER MEDICATION Take 15 mLs by mouth 3 (three) times daily with meals. Liquid Acidophilus  . Propylene Glycol-Glycerin 0.6-0.6 % SOLN Place 1 drop into both eyes in the morning and at bedtime.     Allergies:   Eliquis [apixaban], Ivp dye [iodinated diagnostic agents], Oxycodone, Tiotropium bromide monohydrate, Doxycycline, Erythromycin, Flonase [fluticasone propionate], Keflex [cephalexin], Other, Paxil [paroxetine hcl], Premarin [conjugated estrogens], Provera [medroxyprogesterone  acetate], Xarelto [rivaroxaban], Zithromax [azithromycin], and Zoloft [sertraline hcl]   Social History   Socioeconomic History  . Marital status: Married    Spouse name: Not on file  . Number of children: Not on file  . Years of education: Not on file  . Highest education level: Not on file  Occupational History  . Not on file  Tobacco Use  . Smoking status: Former Smoker    Packs/day: 1.00    Years: 30.00    Pack years: 30.00    Types: Cigarettes    Quit date: 2005    Years since quitting: 17.0  . Smokeless tobacco: Never Used   Vaping Use  . Vaping Use: Never used  Substance and Sexual Activity  . Alcohol use: No  . Drug use: No  . Sexual activity: Not Currently    Partners: Male    Birth control/protection: Post-menopausal  Other Topics Concern  . Not on file  Social History Narrative  . Not on file   Social Determinants of Health   Financial Resource Strain: Not on file  Food Insecurity: Not on file  Transportation Needs: Not on file  Physical Activity: Not on file  Stress: Not on file  Social Connections: Not on file     Family History: The patient's family history includes Breast cancer in her maternal aunt and maternal grandmother; Breast cancer (age of onset: 34) in her sister; Breast cancer (age of onset: 37) in her sister; Heart disease in her mother; Rectal cancer in her paternal grandmother; Stroke in her father.  ROS:   Please see the history of present illness.    Plays violin.  Walks 30 to 40 minutes every day of the week.  No limitations.  All other systems reviewed and are negative.  EKGs/Labs/Other Studies Reviewed:    The following studies were reviewed today: Wynetta Emery all LDL in December was 126.  Other labs looked okay.  Had a PET scan.  Nothing bad was found.  EKG:  EKG normal sinus rhythm, nonspecific T wave flattening, otherwise unremarkable when compared to 10/16/2019, fact is no longer present.  Recent Labs: 08/25/2020: ALT 14; BUN 10; Creatinine, Ser 1.01; Hemoglobin 12.6; Platelets 321; Potassium 4.0; Sodium 140  Recent Lipid Panel No results found for: CHOL, TRIG, HDL, CHOLHDL, VLDL, LDLCALC, LDLDIRECT  Physical Exam:    VS:  BP (!) 150/74   Pulse 75   Ht 5\' 6"  (1.676 m)   Wt 133 lb 12.8 oz (60.7 kg)   LMP 11/14/1992   SpO2 99%   BMI 21.60 kg/m     Wt Readings from Last 3 Encounters:  11/23/20 133 lb 12.8 oz (60.7 kg)  10/05/20 135 lb (61.2 kg)  09/18/20 135 lb (61.2 kg)     GEN: Compatible with age. No acute distress HEENT: Normal NECK: No  JVD. LYMPHATICS: No lymphadenopathy CARDIAC: No murmur. RRR no gallop, or edema. VASCULAR:  Normal Pulses. No bruits. RESPIRATORY:  Clear to auscultation without rales, wheezing or rhonchi  ABDOMEN: Soft, non-tender, non-distended, No pulsatile mass, MUSCULOSKELETAL: No deformity  SKIN: Warm and dry NEUROLOGIC:  Alert and oriented x 3 PSYCHIATRIC:  Normal affect   ASSESSMENT:    1. Paroxysmal atrial fibrillation (HCC)   2. Pure hypercholesterolemia   3. Chronic anticoagulation   4. Educated about COVID-19 virus infection    PLAN:    In order of problems listed above:  1. Stable and relatively symptom-free.  If frequency increases or episodes become more prolonged, she will  let us know. 2. Continue low-dose statin therapy. 3. Anticoagulation was discontinued when she developed symptoms on both Xarelto and apixaban.  She finally self discontinued.  She is on aspirin per day. 4. She is vaccinated and practicing mitigation.   Medication Adjustments/Labs and Tests Ordered: Current medicines are reviewed at length with the patient today.  Concerns regarding medicines are outlined above.  Orders Placed This Encounter  Procedures  . EKG 12-Lead   No orders of the defined types were placed in this encounter.   Patient Instructions  Medication Instructions:  Your physician recommends that you continue on your current medications as directed. Please refer to the Current Medication list given to you today.  *If you need a refill on your cardiac medications before your next appointment, please call your pharmacy*   Lab Work: None If you have labs (blood work) drawn today and your tests are completely normal, you will receive your results only by: Marland Kitchen MyChart Message (if you have MyChart) OR . A paper copy in the mail If you have any lab test that is abnormal or we need to change your treatment, we will call you to review the  results.   Testing/Procedures: None   Follow-Up: At [facility], you and your health needs are our priority.  As part of our continuing mission to provide you with exceptional heart care, we have created designated Provider Care Teams.  These Care Teams include your primary Cardiologist (physician) and Advanced Practice Providers (APPs -  Physician Assistants and Nurse Practitioners) who all work together to provide you with the care you need, when you need it.  We recommend signing up for the patient portal called "MyChart".  Sign up information is provided on this After Visit Summary.  MyChart is used to connect with patients for Virtual Visits (Telemedicine).  Patients are able to view lab/test results, encounter notes, upcoming appointments, etc.  Non-urgent messages can be sent to your provider as well.   To learn more about what you can do with MyChart, go to [email or web address].    Your next appointment:   1 year(s)  The format for your next appointment:   In Person  Provider:   You may see Sinclair Grooms, MD or one of the following Advanced Practice Providers on your designated Care Team:    Truitt Merle, NP  Cecilie Kicks, NP  Kathyrn Drown, NP    Other Instructions      Signed, Sinclair Grooms, MD  11/23/2020 2:25 PM    [place]

## 2020-11-23 ENCOUNTER — Other Ambulatory Visit: Payer: Self-pay

## 2020-11-23 ENCOUNTER — Ambulatory Visit: Payer: Medicare Other | Admitting: Interventional Cardiology

## 2020-11-23 ENCOUNTER — Encounter: Payer: Self-pay | Admitting: Interventional Cardiology

## 2020-11-23 VITALS — BP 150/74 | HR 75 | Ht 66.0 in | Wt 133.8 lb

## 2020-11-23 DIAGNOSIS — Z7901 Long term (current) use of anticoagulants: Secondary | ICD-10-CM

## 2020-11-23 DIAGNOSIS — I480 Paroxysmal atrial fibrillation: Secondary | ICD-10-CM | POA: Diagnosis not present

## 2020-11-23 DIAGNOSIS — Z7189 Other specified counseling: Secondary | ICD-10-CM

## 2020-11-23 DIAGNOSIS — E7800 Pure hypercholesterolemia, unspecified: Secondary | ICD-10-CM

## 2020-11-23 NOTE — Patient Instructions (Signed)
Medication Instructions:  Your physician recommends that you continue on your current medications as directed. Please refer to the Current Medication list given to you today.  *If you need a refill on your cardiac medications before your next appointment, please call your pharmacy*   Lab Work: None If you have labs (blood work) drawn today and your tests are completely normal, you will receive your results only by: . MyChart Message (if you have MyChart) OR . A paper copy in the mail If you have any lab test that is abnormal or we need to change your treatment, we will call you to review the results.   Testing/Procedures: None   Follow-Up: At [facility], you and your health needs are our priority.  As part of our continuing mission to provide you with exceptional heart care, we have created designated Provider Care Teams.  These Care Teams include your primary Cardiologist (physician) and Advanced Practice Providers (APPs -  Physician Assistants and Nurse Practitioners) who all work together to provide you with the care you need, when you need it.  We recommend signing up for the patient portal called "MyChart".  Sign up information is provided on this After Visit Summary.  MyChart is used to connect with patients for Virtual Visits (Telemedicine).  Patients are able to view lab/test results, encounter notes, upcoming appointments, etc.  Non-urgent messages can be sent to your provider as well.   To learn more about what you can do with MyChart, go to [email or web address].    Your next appointment:   1 year(s)  The format for your next appointment:   In Person  Provider:   You may see Henry W Smith III, MD or one of the following Advanced Practice Providers on your designated Care Team:    Lori Gerhardt, NP  Laura Ingold, NP  Jill McDaniel, NP    Other Instructions   

## 2020-11-24 ENCOUNTER — Ambulatory Visit: Payer: Medicare Other | Admitting: Physical Therapy

## 2020-11-24 ENCOUNTER — Encounter: Payer: Self-pay | Admitting: Physical Therapy

## 2020-11-24 DIAGNOSIS — M6281 Muscle weakness (generalized): Secondary | ICD-10-CM

## 2020-11-24 DIAGNOSIS — G8929 Other chronic pain: Secondary | ICD-10-CM

## 2020-11-24 DIAGNOSIS — M542 Cervicalgia: Secondary | ICD-10-CM

## 2020-11-24 DIAGNOSIS — M25512 Pain in left shoulder: Secondary | ICD-10-CM

## 2020-11-24 NOTE — Therapy (Signed)
[facility] [place], [place], [address] Phone: [phone]   Fax:  [phone]  Physical Therapy Treatment/Discharge Summary  Patient Details  Name: Julie Mann MRN: [id number] Date of Birth: 09/13/1946 Referring Provider (PT): Joni Fears, MD,    Encounter Date: 11/24/2020   PT End of Session - 11/24/20 1348    Visit Number 3    Number of Visits 6    Date for PT Re-Evaluation 12/02/20    PT Start Time 1301    PT Stop Time 1343    PT Time Calculation (min) 42 min    Activity Tolerance Patient tolerated treatment well    Behavior During Therapy [facility] for tasks assessed/performed           Past Medical History:  Diagnosis Date  . 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 Anxiety   . Arthritis    "hands, fingers, back" (02/27/2014)  . Atrial fibrillation ([place])   . Cat scratch of right hand 02/24/2014  . COPD (chronic obstructive pulmonary disease) ([place])   . Dyspnea    walking up hill;.  stairs (If Iam tired)  . Dysrhythmia    a fib  . Fall from horse 01/2018  . Fracture, ankle 02/2019   Right   . High cholesterol   . History of stomach ulcers ~ 1966  . Osteopenia     Past Surgical History:  Procedure Laterality Date  . ANUS SURGERY  2005   "biopsy"  . BREAST CYST EXCISION Right 1966  . DILATION AND CURETTAGE OF UTERUS  1980's   S/P miscarriage  . EXTERNAL FIXATION LEG Right 03/10/2019   Procedure: OPEN REDUCTION INTERNAL FIXATION RIGHT ANKLE;  Surgeon: Marchia Bond, MD;  Location: [facility];  Service: Orthopedics;  Laterality: Right;  Marland Kitchen VARICOSE VEIN SURGERY Left    left leg  . VIDEO BRONCHOSCOPY WITH ENDOBRONCHIAL NAVIGATION Right 08/25/2020   Procedure: VIDEO BRONCHOSCOPY WITH ENDOBRONCHIAL NAVIGATION WITH FIDUCIAL PLACEMENT;  Surgeon: Garner Nash, DO;  Location: [facility];  Service: Pulmonary;  Laterality: Right;    There were no vitals filed for this visit.   Subjective  Assessment - 11/24/20 1304    Subjective doing pretty well; had a cold for the past few weeks so taking time to recover.  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 No/denies              [facility] PT Assessment - 11/24/20 1337      Assessment   Medical Diagnosis M54.2 (ICD-10-CM) - Neck pain    Referring Provider (PT) Joni Fears, MD,       Observation/Other Assessments   Focus on Therapeutic Outcomes (FOTO)  64      Strength   Left Shoulder ABduction 4+/5                         [facility] Adult PT Treatment/Exercise - 11/24/20 1310      Exercises   Exercises Shoulder      Shoulder Exercises: Standing   External Rotation Both   3x10   External Rotation Weight (lbs) 3    Other Standing Exercises bicep curls with overhead press 2x10; 3# bil    Other Standing Exercises tricep kick back 3#  3x10 bil      Manual Therapy   Manual therapy comments suboccipital release with tennis balls x 3 min                  PT Education - 11/24/20 1348    Education Details HEP    Person(s) Educated Patient    Methods Explanation    Comprehension Verbalized understanding               PT Long Term Goals - 11/24/20 1348      PT LONG TERM GOAL #1   Title independent with HEP    Status Achieved      PT LONG TERM GOAL #2   Title demonstrate 4/5 Lt shoulder abduction strength for improved function    Status Achieved      PT LONG TERM GOAL #3   Title FOTO score maintained    Status Achieved      PT LONG TERM GOAL #4   Title report pain < 3/10 with playing violin for improved function    Status Achieved      PT LONG TERM GOAL #5   Title n/a                 Plan - 11/24/20 1348    Clinical Impression Statement Pt tolerated session well today, and has met all goals and ready for d/c  from PT today.  Will d/c PT today, and she has extensive HEP to continue to work on strengthening.    Personal Factors and Comorbidities Comorbidity 3+;Past/Current Experience;Profession;Time since onset of injury/illness/exacerbation    Examination-Activity Limitations Reach Overhead;Lift    Examination-Participation Restrictions Occupation    Stability/Clinical Decision Making Stable/Uncomplicated    Rehab Potential Good    PT Frequency 1x / week    PT Duration 6 weeks    PT Treatment/Interventions ADLs/Self Care Home Management;Cryotherapy;Electrical Stimulation;Iontophoresis 4mg /ml Dexamethasone;Moist Heat;Therapeutic exercise;Therapeutic activities;Ultrasound;Neuromuscular re-education;Patient/family education;Manual techniques;Taping;Dry needling    PT Next Visit Plan d/c PT today    PT Home Exercise Plan Access Code: [email or web address]    Consulted and Agree with Plan of Care Patient           Patient will benefit from skilled therapeutic intervention in order to improve the following deficits and impairments:  Increased fascial restricitons,Increased muscle spasms,Pain,Decreased strength  Visit Diagnosis: Cervicalgia  Chronic left shoulder pain  Muscle weakness (generalized)     Problem List Patient Active Problem List   Diagnosis Date Noted  . Neck pain 10/05/2020  . Nodule of upper lobe of right lung 08/17/2020  . Biceps tendonosis of left shoulder 02/13/2020  . Impingement syndrome of left shoulder 02/13/2020  . Post-operative state 02/13/2020  . Ankle dislocation, right, initial encounter 03/10/2019  . Trimalleolar fracture of ankle, closed, right, initial encounter   . Chronic left shoulder pain 02/19/2019  . Anxiety 12/17/2016  . 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[place]) 12/17/2016  . Osteopenia 12/17/2016  . Paroxysmal atrial fibrillation ([place]) 12/17/2016  . Pure hypercholesterolemia 12/17/2016  . Rectal cancer ([place]) 07/30/2015     Laureen Abrahams, PT,  DPT 11/24/20 1:50 PM    [facility] [address] [place], [place], [address] Phone: [phone]   Fax:  [phone]  Name: SUMAYYA MUHA MRN: [id number] Date of Birth: 1946-08-16    PHYSICAL THERAPY DISCHARGE SUMMARY  Visits from Start of Care: 3  Current functional level related to goals / functional outcomes: See above   Remaining deficits: See above  Education / Equipment: HEP  Plan: Patient agrees to discharge.  Patient goals were met. Patient is being discharged due to meeting the stated rehab goals.  ?????    Laureen Abrahams, PT, DPT 11/24/20 1:53 PM  [facility] [address] [place], [place], [address] Phone: [phone]   Fax:  [phone]

## 2020-11-24 NOTE — Patient Instructions (Signed)
Access Code: [email or web address] URL: [email or web address] Date: 11/24/2020 Prepared by: Faustino Congress  Exercises Seated Upper Trapezius Stretch - 2 x daily - 7 x weekly - 3 reps - 1 sets - 30 sec hold Seated Levator Scapulae Stretch - 2 x daily - 7 x weekly - 1 reps - 1 sets - 30 sec hold Standing Single Arm Shoulder Abduction with Dumbbell - Thumb Up - 1 x daily - 7 x weekly - 3 sets - 10 reps Single Arm Bent Over Shoulder Horizontal Abduction with Dumbbell - Thumb Up - 1 x daily - 7 x weekly - 3 sets - 10 reps Single Arm Shoulder Flexion with Dumbbell - 1 x daily - 7 x weekly - 3 sets - 10 reps Horizontal Shoulder Abduction and Adduction with Compression Garment - 1 x daily - 7 x weekly - 3 sets - 10 reps Bent Over Single Arm Shoulder Row with Dumbbell - 1 x daily - 7 x weekly - 3 sets - 10 reps Standing Shoulder External Rotation with Dumbbell - 1 x daily - 7 x weekly - 3 sets - 10 reps Shoulder Overhead Press in Abduction with Dumbbells - 1 x daily - 7 x weekly - 3 sets - 10 reps Standing Triceps Extension with Dumbbells with PLB - 1 x daily - 7 x weekly - 3 sets - 10 reps  Patient Education Trigger Point Dry Needling

## 2020-11-25 DIAGNOSIS — J449 Chronic obstructive pulmonary disease, unspecified: Secondary | ICD-10-CM | POA: Diagnosis not present

## 2020-11-25 DIAGNOSIS — K219 Gastro-esophageal reflux disease without esophagitis: Secondary | ICD-10-CM | POA: Diagnosis not present

## 2020-11-25 DIAGNOSIS — I480 Paroxysmal atrial fibrillation: Secondary | ICD-10-CM | POA: Diagnosis not present

## 2020-11-25 DIAGNOSIS — E7800 Pure hypercholesterolemia, unspecified: Secondary | ICD-10-CM | POA: Diagnosis not present

## 2020-12-02 ENCOUNTER — Other Ambulatory Visit: Payer: Self-pay

## 2020-12-02 ENCOUNTER — Inpatient Hospital Stay: Admission: RE | Admit: 2020-12-02 | Payer: Medicare Other | Source: Ambulatory Visit

## 2020-12-02 ENCOUNTER — Ambulatory Visit (INDEPENDENT_AMBULATORY_CARE_PROVIDER_SITE_OTHER)
Admission: RE | Admit: 2020-12-02 | Discharge: 2020-12-02 | Disposition: A | Discharge status: Home / Self Care | Payer: Medicare Other | Source: Ambulatory Visit | Attending: Pulmonary Disease | Admitting: Pulmonary Disease

## 2020-12-02 DIAGNOSIS — I2510 Atherosclerotic heart disease of native coronary artery without angina pectoris: Secondary | ICD-10-CM | POA: Diagnosis not present

## 2020-12-02 DIAGNOSIS — J439 Emphysema, unspecified: Secondary | ICD-10-CM | POA: Diagnosis not present

## 2020-12-02 DIAGNOSIS — R911 Solitary pulmonary nodule: Secondary | ICD-10-CM | POA: Diagnosis not present

## 2020-12-02 DIAGNOSIS — K753 Granulomatous hepatitis, not elsewhere classified: Secondary | ICD-10-CM | POA: Diagnosis not present

## 2020-12-02 DIAGNOSIS — J984 Other disorders of lung: Secondary | ICD-10-CM | POA: Diagnosis not present

## 2020-12-04 DIAGNOSIS — R21 Rash and other nonspecific skin eruption: Secondary | ICD-10-CM | POA: Diagnosis not present

## 2020-12-04 DIAGNOSIS — J989 Respiratory disorder, unspecified: Secondary | ICD-10-CM | POA: Diagnosis not present

## 2020-12-10 NOTE — Progress Notes (Signed)
Leigh, can you make sure she has a televisit with me to discuss next steps. I can do it this Friday from the hospital if you want to add on at like 1pm? I am doing cases in the morning.   Thanks,  BLI  Garner Nash, DO [facility] 12/10/2020 8:03 AM

## 2020-12-17 DIAGNOSIS — R519 Headache, unspecified: Secondary | ICD-10-CM | POA: Diagnosis not present

## 2020-12-17 DIAGNOSIS — J029 Acute pharyngitis, unspecified: Secondary | ICD-10-CM | POA: Diagnosis not present

## 2020-12-17 DIAGNOSIS — R0989 Other specified symptoms and signs involving the circulatory and respiratory systems: Secondary | ICD-10-CM | POA: Diagnosis not present

## 2020-12-24 ENCOUNTER — Ambulatory Visit: Payer: Medicare Other | Admitting: Pulmonary Disease

## 2020-12-24 ENCOUNTER — Encounter: Payer: Self-pay | Admitting: Pulmonary Disease

## 2020-12-24 ENCOUNTER — Other Ambulatory Visit: Payer: Self-pay

## 2020-12-24 VITALS — BP 140/82 | HR 86 | Temp 97.2°F | Ht 67.0 in | Wt 134.0 lb

## 2020-12-24 DIAGNOSIS — J449 Chronic obstructive pulmonary disease, unspecified: Secondary | ICD-10-CM

## 2020-12-24 DIAGNOSIS — R911 Solitary pulmonary nodule: Secondary | ICD-10-CM | POA: Diagnosis not present

## 2020-12-24 DIAGNOSIS — J432 Centrilobular emphysema: Secondary | ICD-10-CM

## 2020-12-24 NOTE — Patient Instructions (Addendum)
Thank you for visiting Dr. Valeta Harms at [facility]. Today we recommend the following:  Orders Placed This Encounter  Procedures  . CT Super D Chest Wo Contrast   Return in about 6 months (around 06/23/2021) for with APP or Dr. Valeta Harms.    Please do your part to reduce the spread of COVID-19.

## 2020-12-24 NOTE — Progress Notes (Signed)
Synopsis: Referred in October 2021 for lung nodule by Lajean Manes, MD.  Referral from Dr. Kipp Brood.  Subjective:   PATIENT ID: Julie Mann GENDER: female DOB: 28-Jan-1946, MRN: [id number]  Chief Complaint  Patient presents with  . Follow-up    Pt has been doing well.  Here to review her CT results    This is a 75 year old female, history of anal cancer status post chemo plus radiation, atrial fibrillation on aspirin 81 mg daily.  Patient is a former smoker, 30-pack-year history.  CT imaging of the chest was completed in August 2021 by primary care which revealed a interval increase in a subcentimeter pulmonary nodule now 7 mm in size.  It has slowly been growing since 2019.  Images reviewed from 2019 at 5 mm, August 2020 at 6 mm and now August 2021 nodules at 7 mm.  She does have associated upper lobe predominant emphysema.  She is currently relatively asymptomatic except for dyspnea on exertion.  She did have pulmonary function test completed recently which revealed an FEV1 postbronchodilator of 76% consistent with stage II COPD.  She does notice that her dyspnea is worse with climbing stairs.  She does feel out of breath and has to stop which has changed some and likely gotten worse over the past 6 months.  Rarely has any sputum production.  She does not use any maintenance inhalers.  She does not have an albuterol inhaler.  She did have anal cancer and tolerated the radiation well.  She said she did not do well with the chemo treatments.  She is very anxious about having a large surgery for a lung nodule.  If she could have radiation treatments she would prefer this.  Today I discussed the risk benefits and alternatives of proceeding with navigational bronchoscopy.  OV 12/24/2020: Here today for follow-up after recent CT imaging.  Patient was taken initially after CT imaging revealed a upper lobe spiculated 7 mm nodule concerning for malignancy last fall for navigational bronchoscopy.  Tissue  sampling was negative for malignancy.  Cultures negative.  3 fiducials were placed at the time.  Patient here after follow-up images at approximately 5 months from previous.  Shows persistence of the 7 mm right upper lobe nodule with associated centrilobular emphysema.  Also with the adjacent fiducial markers.   Past Medical History:  Diagnosis Date  . Anal cancer ([place]) 2005   SCCa anus-stage II chemo, radiation  . Anxiety   . Arthritis    "hands, fingers, back" (02/27/2014)  . Atrial fibrillation ([place])   . Cat scratch of right hand 02/24/2014  . COPD (chronic obstructive pulmonary disease) ([place])   . Dyspnea    walking up hill;.  stairs (If Iam tired)  . Dysrhythmia    a fib  . Fall from horse 01/2018  . Fracture, ankle 02/2019   Right   . High cholesterol   . History of stomach ulcers ~ 1966  . Osteopenia      Family History  Problem Relation Age of Onset  . Breast cancer Maternal Aunt   . Breast cancer Maternal Grandmother   . Heart disease Mother   . Stroke Father   . Rectal cancer Paternal Grandmother   . Breast cancer Sister 71  . Breast cancer Sister 36     Past Surgical History:  Procedure Laterality Date  . ANUS SURGERY  2005   "biopsy"  . BREAST CYST EXCISION Right 1966  . DILATION AND CURETTAGE OF UTERUS  1980's   S/P miscarriage  . EXTERNAL FIXATION LEG Right 03/10/2019   Procedure: OPEN REDUCTION INTERNAL FIXATION RIGHT ANKLE;  Surgeon: Marchia Bond, MD;  Location: [facility];  Service: Orthopedics;  Laterality: Right;  Marland Kitchen VARICOSE VEIN SURGERY Left    left leg  . VIDEO BRONCHOSCOPY WITH ENDOBRONCHIAL NAVIGATION Right 08/25/2020   Procedure: VIDEO BRONCHOSCOPY WITH ENDOBRONCHIAL NAVIGATION WITH FIDUCIAL PLACEMENT;  Surgeon: Garner Nash, DO;  Location: [facility];  Service: Pulmonary;  Laterality: Right;    Social History   Socioeconomic History  . Marital status: Married    Spouse name: Not on file  . Number of children: Not on file  . Years of education:  Not on file  . Highest education level: Not on file  Occupational History  . Not on file  Tobacco Use  . Smoking status: Former Smoker    Packs/day: 1.00    Years: 30.00    Pack years: 30.00    Types: Cigarettes    Quit date: 2005    Years since quitting: 17.1  . Smokeless tobacco: Never Used  Vaping Use  . Vaping Use: Never used  Substance and Sexual Activity  . Alcohol use: No  . Drug use: No  . Sexual activity: Not Currently    Partners: Male    Birth control/protection: Post-menopausal  Other Topics Concern  . Not on file  Social History Narrative  . Not on file   Social Determinants of Health   Financial Resource Strain: Not on file  Food Insecurity: Not on file  Transportation Needs: Not on file  Physical Activity: Not on file  Stress: Not on file  Social Connections: Not on file  Intimate Partner Violence: Not on file     Allergies  Allergen Reactions  . Eliquis [Apixaban] Anaphylaxis    Made her feel sick  . Ivp Dye [Iodinated Diagnostic Agents] Swelling  . Oxycodone Other (See Comments)    Went into Afib.   . Tiotropium Bromide Monohydrate Other (See Comments)    Headache, Blurry vision, chest heaviness.  . Doxycycline Other (See Comments)    Dizziness and tingling  . Erythromycin Other (See Comments)    Hallucinations   . Flonase [Fluticasone Propionate]   . Keflex [Cephalexin] Other (See Comments)    Hallucinations.  . Other     Other reaction(s): hallucinations Other reaction(s): sedation Other reaction(s): hallucinations  . Paxil [Paroxetine Hcl] Diarrhea  . Premarin [Conjugated Estrogens]     Felt weird  . Provera [Medroxyprogesterone Acetate]     Patient do not remember reaction  . Xarelto [Rivaroxaban]     Made her feel sick  . Zithromax [Azithromycin] Other (See Comments)    hallucinations  . Zoloft [Sertraline Hcl] Other (See Comments)    sedation     Outpatient Medications Prior to Visit  Medication Sig Dispense Refill  .  aspirin EC 81 MG tablet Take 1 tablet (81 mg total) by mouth daily. 90 tablet 3  . atorvastatin (LIPITOR) 10 MG tablet Take 10 mg by mouth 2 (two) times a week. Monday and thursday  12  . ibuprofen (ADVIL) 600 MG tablet Take 1 tablet (600 mg total) by mouth daily as needed for moderate pain. 30 tablet 2  . LORazepam (ATIVAN) 1 MG tablet Take 0.5 tablets (0.5 mg total) by mouth at bedtime. 30 tablet 2  . metoprolol tartrate (LOPRESSOR) 25 MG tablet Take 25 mg by mouth daily as needed (palpitations/AFIB).     . Multiple Vitamins-Minerals (MULTIVITAMIN  PO) Take 1 tablet by mouth daily.    Marland Kitchen OVER THE COUNTER MEDICATION Take 15 mLs by mouth daily. acidophilus    . OVER THE COUNTER MEDICATION Take 15 mLs by mouth 3 (three) times daily with meals. Liquid Acidophilus    . Propylene Glycol-Glycerin 0.6-0.6 % SOLN Place 1 drop into both eyes in the morning and at bedtime.    Marland Kitchen albuterol (VENTOLIN HFA) 108 (90 Base) MCG/ACT inhaler Inhale 2 puffs into the lungs every 6 (six) hours as needed for wheezing or shortness of breath. (Patient not taking: Reported on 12/24/2020) 8 g 6   No facility-administered medications prior to visit.    Review of Systems  Constitutional: Negative for chills, fever, malaise/fatigue and weight loss.  HENT: Negative for hearing loss, sore throat and tinnitus.   Eyes: Negative for blurred vision and double vision.  Respiratory: Negative for cough, hemoptysis, sputum production, shortness of breath, wheezing and stridor.   Cardiovascular: Negative for chest pain, palpitations, orthopnea, leg swelling and PND.  Gastrointestinal: Negative for abdominal pain, constipation, diarrhea, heartburn, nausea and vomiting.  Genitourinary: Negative for dysuria, hematuria and urgency.  Musculoskeletal: Negative for joint pain and myalgias.  Skin: Negative for itching and rash.  Neurological: Negative for dizziness, tingling, weakness and headaches.  Endo/Heme/Allergies: Negative for  environmental allergies. Does not bruise/bleed easily.  Psychiatric/Behavioral: Negative for depression. The patient is not nervous/anxious and does not have insomnia.   All other systems reviewed and are negative.    Objective:  Physical Exam Vitals reviewed.  Constitutional:      General: She is not in acute distress.    Appearance: She is well-developed and well-nourished.  HENT:     Head: Normocephalic and atraumatic.     Mouth/Throat:     Mouth: Oropharynx is clear and moist.  Eyes:     General: No scleral icterus.    Conjunctiva/sclera: Conjunctivae normal.     Pupils: Pupils are equal, round, and reactive to light.  Neck:     Vascular: No JVD.     Trachea: No tracheal deviation.  Cardiovascular:     Rate and Rhythm: Normal rate and regular rhythm.     Pulses: Intact distal pulses.     Heart sounds: Normal heart sounds. No murmur heard.   Pulmonary:     Effort: Pulmonary effort is normal. No tachypnea, accessory muscle usage or respiratory distress.     Breath sounds: No stridor. No wheezing, rhonchi or rales.  Abdominal:     General: Bowel sounds are normal. There is no distension.     Palpations: Abdomen is soft.     Tenderness: There is no abdominal tenderness.  Musculoskeletal:        General: No tenderness or edema.     Cervical back: Neck supple.  Lymphadenopathy:     Cervical: No cervical adenopathy.  Skin:    General: Skin is warm and dry.     Capillary Refill: Capillary refill takes less than 2 seconds.     Findings: No rash.  Neurological:     Mental Status: She is alert and oriented to person, place, and time.  Psychiatric:        Mood and Affect: Mood and affect normal.        Behavior: Behavior normal.      Vitals:   12/24/20 1444  BP: 140/82  Pulse: 86  Temp: (!) 97.2 F (36.2 C)  TempSrc: Tympanic  SpO2: 99%  Weight: 134 lb (60.8 kg)  Height:  5\' 7"  (1.702 m)   99% on RA BMI Readings from Last 3 Encounters:  12/24/20 20.99 kg/m   11/23/20 21.60 kg/m  10/05/20 21.79 kg/m   Wt Readings from Last 3 Encounters:  12/24/20 134 lb (60.8 kg)  11/23/20 133 lb 12.8 oz (60.7 kg)  10/05/20 135 lb (61.2 kg)     CBC    Component Value Date/Time   WBC 6.6 08/25/2020 0644   RBC 4.76 08/25/2020 0644   HGB 12.6 08/25/2020 0644   HGB 13.0 03/24/2009 0913   HCT 40.5 08/25/2020 0644   HCT 38.8 03/24/2009 0913   PLT 321 08/25/2020 0644   PLT 317 03/24/2009 0913   MCV 85.1 08/25/2020 0644   MCV 82.4 03/24/2009 0913   MCH 26.5 08/25/2020 0644   MCHC 31.1 08/25/2020 0644   RDW 14.2 08/25/2020 0644   RDW 13.8 03/24/2009 0913   LYMPHSABS 1.0 02/05/2018 1748   LYMPHSABS 1.0 03/24/2009 0913   MONOABS 0.7 02/05/2018 1748   MONOABS 0.3 03/24/2009 0913   EOSABS 0.1 02/05/2018 1748   EOSABS 0.1 03/24/2009 0913   BASOSABS 0.0 02/05/2018 1748   BASOSABS 0.0 03/24/2009 0913    Chest Imaging: CT chest August 2021: Enlarging right upper lobe pulmonary nodule 7 mm in size with associated centrilobular emphysema.  Concerning for a low-grade malignancy. The patient's images have been independently reviewed by me.    CT chest January 2022: Patient has stable 7 mm spiculated upper lobe nodule with adjacent fiducial markers. The patient's images have been independently reviewed by me.    Pulmonary Functions Testing Results: PFT Results Latest Ref Rng & Units 07/28/2020  FVC-Pre L 2.72  FVC-Predicted Pre % 86  FVC-Post L 2.93  FVC-Predicted Post % 93  Pre FEV1/FVC % % 58  Post FEV1/FCV % % 62  FEV1-Pre L 1.58  FEV1-Predicted Pre % 66  FEV1-Post L 1.81  DLCO uncorrected ml/min/mmHg 15.41  DLCO UNC% % 73  DLCO corrected ml/min/mmHg 15.41  DLCO COR %Predicted % 73  DLVA Predicted % 78  TLC L 6.35  TLC % Predicted % 115  RV % Predicted % 139    FeNO:   Pathology:   Echocardiogram:   Heart Catheterization:     Assessment & Plan:     ICD-10-CM   1. Lung nodule  R91.1 CT Super D Chest Wo Contrast  2. Stage 2  moderate COPD by GOLD classification ([place])  J44.9   3. Centrilobular emphysema (HCC)  J43.2     Assessment:   Right upper lobe pulmonary nodule, spiculated margins associated bilateral centrilobular emphysema Stage II COPD, FEV1 76% predicted postbronchodilator response  Plan: Discussed various options today with the patient to include considerations for referral to radiation oncology, repeat biopsy or repeat follow-up imaging. Patient would like to continue to follow lesion with surveillance imaging. Repeat noncontrasted CT of the chest in 6 months has been ordered. Patient was given inhalers to trial last time but at this time not using anything for COPD or shortness of breath.  Patient can follow-up with [place] in 6 months after repeat CT imaging complete to discuss next steps.    Current Outpatient Medications:  .  aspirin EC 81 MG tablet, Take 1 tablet (81 mg total) by mouth daily., Disp: 90 tablet, Rfl: 3 .  atorvastatin (LIPITOR) 10 MG tablet, Take 10 mg by mouth 2 (two) times a week. Monday and thursday, Disp: , Rfl: 12 .  ibuprofen (ADVIL) 600 MG tablet, Take 1 tablet (600  mg total) by mouth daily as needed for moderate pain., Disp: 30 tablet, Rfl: 2 .  LORazepam (ATIVAN) 1 MG tablet, Take 0.5 tablets (0.5 mg total) by mouth at bedtime., Disp: 30 tablet, Rfl: 2 .  metoprolol tartrate (LOPRESSOR) 25 MG tablet, Take 25 mg by mouth daily as needed (palpitations/AFIB). , Disp: , Rfl:  .  Multiple Vitamins-Minerals (MULTIVITAMIN PO), Take 1 tablet by mouth daily., Disp: , Rfl:  .  OVER THE COUNTER MEDICATION, Take 15 mLs by mouth daily. acidophilus, Disp: , Rfl:  .  OVER THE COUNTER MEDICATION, Take 15 mLs by mouth 3 (three) times daily with meals. Liquid Acidophilus, Disp: , Rfl:  .  Propylene Glycol-Glycerin 0.6-0.6 % SOLN, Place 1 drop into both eyes in the morning and at bedtime., Disp: , Rfl:  .  albuterol (VENTOLIN HFA) 108 (90 Base) MCG/ACT inhaler, Inhale 2 puffs into the lungs  every 6 (six) hours as needed for wheezing or shortness of breath. (Patient not taking: Reported on 12/24/2020), Disp: 8 g, Rfl: 6    Garner Nash, DO [facility] 12/24/2020 2:58 PM

## 2021-01-04 ENCOUNTER — Ambulatory Visit: Payer: Medicare Other | Admitting: Orthopedic Surgery

## 2021-01-04 ENCOUNTER — Encounter: Payer: Self-pay | Admitting: Orthopedic Surgery

## 2021-01-04 VITALS — Ht 67.0 in | Wt 134.0 lb

## 2021-01-04 DIAGNOSIS — M25871 Other specified joint disorders, right ankle and foot: Secondary | ICD-10-CM

## 2021-01-05 ENCOUNTER — Encounter: Payer: Self-pay | Admitting: Orthopedic Surgery

## 2021-01-05 DIAGNOSIS — M25871 Other specified joint disorders, right ankle and foot: Secondary | ICD-10-CM | POA: Diagnosis not present

## 2021-01-05 MED ORDER — METHYLPREDNISOLONE ACETATE 40 MG/ML IJ SUSP
40.0000 mg | INTRAMUSCULAR | Status: AC | PRN
  Administered 2021-01-05: 40 mg via INTRA_ARTICULAR

## 2021-01-05 MED ORDER — LIDOCAINE HCL 1 % IJ SOLN
2.0000 mL | INTRAMUSCULAR | Status: AC | PRN
  Administered 2021-01-05: 2 mL

## 2021-01-05 NOTE — Progress Notes (Signed)
Office Visit Note   Patient: Julie Mann           Date of Birth: 1946-11-09           MRN: [id number] Visit Date: 01/04/2021              Requested by: Lajean Manes, MD [address],  [place] [address] PCP: Lajean Manes, MD  Chief Complaint  Patient presents with  . Right Ankle - Pain      HPI: Patient is a 75 year old woman who presents in follow-up for impingement symptoms of the right ankle.  Patient had an injection in July of last year and she states she just started having increased pain she feels like she has progressed well with the injection.  Assessment & Plan: Visit Diagnoses:  1. Impingement syndrome of right ankle     Plan: Right ankle was injected she tolerated this well follow-up as needed  Follow-Up Instructions: Return if symptoms worsen or fail to improve.   Ortho Exam  Patient is alert, oriented, no adenopathy, well-dressed, normal affect, normal respiratory effort. Examination patient has good pulses she has good ankle good subtalar motion there is no redness or cellulitis no effusion.  She is tender to palpation anteriorly of the ankle maximum plantarflexion and dorsiflexion reproduces her symptoms.  Imaging: No results found. No images are attached to the encounter.  Labs: Lab Results  Component Value Date   REPTSTATUS 09/15/2020 FINAL 08/25/2020   REPTSTATUS 08/28/2020 FINAL 08/25/2020   GRAMSTAIN  08/25/2020    FEW WBC PRESENT, PREDOMINANTLY MONONUCLEAR NO ORGANISMS SEEN    CULT  08/25/2020    NO FUNGUS ISOLATED AFTER 21 DAYS Performed at [facility], [place] [address] [place], [place] [address]    CULT  08/25/2020    NO GROWTH 2 DAYS Performed at [facility] [address] [place], [place] [address]      Lab Results  Component Value Date   ALBUMIN 3.6 08/25/2020   ALBUMIN 4.1 03/24/2009   ALBUMIN 3.9 09/24/2008    No results found for: MG No results found for: VD25OH  No results  found for: PREALBUMIN CBC EXTENDED Latest Ref Rng & Units 08/25/2020 03/10/2019 02/05/2018  WBC 4.0 - 10.5 K/uL 6.6 13.1(H) 15.7(H)  RBC 3.87 - 5.11 MIL/uL 4.76 4.70 5.17(H)  HGB 12.0 - 15.0 g/dL 12.6 12.8 14.2  HCT 36.0 - 46.0 % 40.5 40.5 43.5  PLT 150 - 400 K/uL 321 287 372  NEUTROABS 1.7 - 7.7 K/uL - - 13.9(H)  LYMPHSABS 0.7 - 4.0 K/uL - - 1.0     Body mass index is 20.99 kg/m.  Orders:  No orders of the defined types were placed in this encounter.  No orders of the defined types were placed in this encounter.    Procedures: Medium Joint Inj: R ankle on 01/05/2021 9:15 AM Indications: pain and diagnostic evaluation Details: 22 G 1.5 in needle, anteromedial approach Medications: 2 mL lidocaine 1 %; 40 mg methylPREDNISolone acetate 40 MG/ML Outcome: tolerated well, no immediate complications Procedure, treatment alternatives, risks and benefits explained, specific risks discussed. Consent was given by the patient. Immediately prior to procedure a time out was called to verify the correct patient, procedure, equipment, support staff and site/side marked as required. Patient was prepped and draped in the usual sterile fashion.      Clinical Data: No additional findings.  ROS:  All other systems negative, except as noted in  the HPI. Review of Systems  Objective: Vital Signs: Ht 5\' 7"  (1.702 m)   Wt 134 lb (60.8 kg)   LMP 11/14/1992   BMI 20.99 kg/m   Specialty Comments:  No specialty comments available.  PMFS History: Patient Active Problem List   Diagnosis Date Noted  . Neck pain 10/05/2020  . Nodule of upper lobe of right lung 08/17/2020  . Biceps tendonosis of left shoulder 02/13/2020  . Impingement syndrome of left shoulder 02/13/2020  . Post-operative state 02/13/2020  . Ankle dislocation, right, initial encounter 03/10/2019  . Trimalleolar fracture of ankle, closed, right, initial encounter   . Chronic left shoulder pain 02/19/2019  . Anxiety 12/17/2016   . 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[place]) 12/17/2016  . Osteopenia 12/17/2016  . Paroxysmal atrial fibrillation ([place]) 12/17/2016  . Pure hypercholesterolemia 12/17/2016  . Rectal cancer ([place]) 07/30/2015   Past Medical History:  Diagnosis Date  . Anal cancer ([place]) 2005   SCCa anus-stage II chemo, radiation  . Anxiety   . Arthritis    "hands, fingers, back" (02/27/2014)  . Atrial fibrillation ([place])   . Cat scratch of right hand 02/24/2014  . COPD (chronic obstructive pulmonary disease) ([place])   . Dyspnea    walking up hill;.  stairs (If Iam tired)  . Dysrhythmia    a fib  . Fall from horse 01/2018  . Fracture, ankle 02/2019   Right   . High cholesterol   . History of stomach ulcers ~ 1966  . Osteopenia     Family History  Problem Relation Age of Onset  . Breast cancer Maternal Aunt   . Breast cancer Maternal Grandmother   . Heart disease Mother   . Stroke Father   . Rectal cancer Paternal Grandmother   . Breast cancer Sister 68  . Breast cancer Sister 29    Past Surgical History:  Procedure Laterality Date  . ANUS SURGERY  2005   "biopsy"  . BREAST CYST EXCISION Right 1966  . DILATION AND CURETTAGE OF UTERUS  1980's   S/P miscarriage  . EXTERNAL FIXATION LEG Right 03/10/2019   Procedure: OPEN REDUCTION INTERNAL FIXATION RIGHT ANKLE;  Surgeon: Marchia Bond, MD;  Location: [facility];  Service: Orthopedics;  Laterality: Right;  Marland Kitchen VARICOSE VEIN SURGERY Left    left leg  . VIDEO BRONCHOSCOPY WITH ENDOBRONCHIAL NAVIGATION Right 08/25/2020   Procedure: VIDEO BRONCHOSCOPY WITH ENDOBRONCHIAL NAVIGATION WITH FIDUCIAL PLACEMENT;  Surgeon: Garner Nash, DO;  Location: [facility];  Service: Pulmonary;  Laterality: Right;   Social History   Occupational History  . Not on file  Tobacco Use  . Smoking status: Former Smoker    Packs/day: 1.00    Years: 30.00    Pack years: 30.00    Types: Cigarettes    Quit date: 2005    Years since quitting: 17.1  . Smokeless tobacco: Never Used   Vaping Use  . Vaping Use: Never used  Substance and Sexual Activity  . Alcohol use: No  . Drug use: No  . Sexual activity: Not Currently    Partners: Male    Birth control/protection: Post-menopausal

## 2021-01-29 ENCOUNTER — Ambulatory Visit: Payer: Medicare Other | Admitting: Physician Assistant

## 2021-01-29 ENCOUNTER — Ambulatory Visit: Payer: Self-pay

## 2021-01-29 ENCOUNTER — Other Ambulatory Visit: Payer: Self-pay

## 2021-01-29 ENCOUNTER — Encounter: Payer: Self-pay | Admitting: Physician Assistant

## 2021-01-29 DIAGNOSIS — M25571 Pain in right ankle and joints of right foot: Secondary | ICD-10-CM

## 2021-01-29 NOTE — Progress Notes (Signed)
Office Visit Note   Patient: Julie Mann           Date of Birth: Feb 04, 1946           MRN: [id number] Visit Date: 01/29/2021              Requested by: Lajean Manes, MD [address],  [place] [address] PCP: Lajean Manes, MD  No chief complaint on file.     HPI: Patient is an active pleasant 75 year old woman with a chief complaint of right lateral ankle pain she is status post ORIF of a trimalleolar ankle fracture 2 years ago.  She had an injection last summer which seemed to help quite a bit of her ankle pain.  She did have a return of some symptoms and was injected approximately 3 weeks ago.  She said that injection did not help her.  She focuses her concerns over the lateral side of the ankle and the lateral plate.  She said this had some mild redness a week ago but is now improved.  She is in the process of moving but also enjoys walking and being active.  She inquires if perhaps the hardware could be removed  Assessment & Plan: Visit Diagnoses:  1. Pain in right ankle and joints of right foot     Plan: I discussed with her That hardware removal would be possible.  I will discuss this with Dr. Sharol Given.  I told her while it would help this symptom she is having I believe on the lateral side of her ankle her intra-articular symptoms would still remain.  She understands her risks with any surgery is bleeding infection and failure of course to solve her painful symptoms.  She would like to go forward if this is a possibility Follow-Up Instructions: No follow-ups on file.   Ortho Exam  Patient is alert, oriented, no adenopathy, well-dressed, normal affect, normal respiratory effort. Right ankle she has some bruising on the anterior lateral aspect.  She is tender over the lateral hardware.  Does not have much tenderness with ankle range of motion.  Has good plantar flexion dorsiflexion eversion inversion no tenderness along peroneal tendons  Imaging: XR  Ankle Complete Right  Result Date: 01/29/2021 X-rays of her right ankle were reviewed today.  She is status post ORIF of a trimalleolar ankle fracture.  Hardware is intact and in place.  She does have some loss of joint space in the tibiotalar joint.  No acute osseous changes no significant changes from x-rays a year ago  No images are attached to the encounter.  Labs: Lab Results  Component Value Date   REPTSTATUS 09/15/2020 FINAL 08/25/2020   REPTSTATUS 08/28/2020 FINAL 08/25/2020   GRAMSTAIN  08/25/2020    FEW WBC PRESENT, PREDOMINANTLY MONONUCLEAR NO ORGANISMS SEEN    CULT  08/25/2020    NO FUNGUS ISOLATED AFTER 21 DAYS Performed at [facility], [place] [address], [place], [place] [address]    CULT  08/25/2020    NO GROWTH 2 DAYS Performed at [facility] [address] [place], [place] [address]      Lab Results  Component Value Date   ALBUMIN 3.6 08/25/2020   ALBUMIN 4.1 03/24/2009   ALBUMIN 3.9 09/24/2008    No results found for: MG No results found for: VD25OH  No results found for: PREALBUMIN CBC EXTENDED Latest Ref Rng & Units 08/25/2020 03/10/2019 02/05/2018  WBC 4.0 - 10.5 K/uL 6.6 13.1(H)  15.7(H)  RBC 3.87 - 5.11 MIL/uL 4.76 4.70 5.17(H)  HGB 12.0 - 15.0 g/dL 12.6 12.8 14.2  HCT 36.0 - 46.0 % 40.5 40.5 43.5  PLT 150 - 400 K/uL 321 287 372  NEUTROABS 1.7 - 7.7 K/uL - - 13.9(H)  LYMPHSABS 0.7 - 4.0 K/uL - - 1.0     There is no height or weight on file to calculate BMI.  Orders:  Orders Placed This Encounter  Procedures  . XR Ankle Complete Right   No orders of the defined types were placed in this encounter.    Procedures: No procedures performed  Clinical Data: No additional findings.  ROS:  All other systems negative, except as noted in the HPI. Review of Systems  Objective: Vital Signs: LMP 11/14/1992   Specialty Comments:  No specialty comments available.  PMFS History: Patient Active Problem List   Diagnosis Date  Noted  . Neck pain 10/05/2020  . Nodule of upper lobe of right lung 08/17/2020  . Biceps tendonosis of left shoulder 02/13/2020  . Impingement syndrome of left shoulder 02/13/2020  . Post-operative state 02/13/2020  . Ankle dislocation, right, initial encounter 03/10/2019  . Trimalleolar fracture of ankle, closed, right, initial encounter   . Chronic left shoulder pain 02/19/2019  . Anxiety 12/17/2016  . 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[facility] [place]) 12/17/2016  . Osteopenia 12/17/2016  . Paroxysmal atrial fibrillation ([place]) 12/17/2016  . Pure hypercholesterolemia 12/17/2016  . Rectal cancer ([place]) 07/30/2015   Past Medical History:  Diagnosis Date  . 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 Anxiety   . Arthritis    "hands, fingers, back" (02/27/2014)  . Atrial fibrillation ([facility] [place])   . Cat scratch of right hand 02/24/2014  . COPD (chronic obstructive pulmonary disease) ([facility])   . Dyspnea    walking up hill;.  stairs (If Iam tired)  . Dysrhythmia    a fib  . Fall from horse 01/2018  . Fracture, ankle 02/2019   Right   . High cholesterol   . History of stomach ulcers ~ 1966  . Osteopenia     Family History  Problem Relation Age of Onset  . Breast cancer Maternal Aunt   . Breast cancer Maternal Grandmother   . Heart disease Mother   . Stroke Father   . Rectal cancer Paternal Grandmother   . Breast cancer Sister 31  . Breast cancer Sister 16    Past Surgical History:  Procedure Laterality Date  . ANUS SURGERY  2005   "biopsy"  . BREAST CYST EXCISION Right 1966  . DILATION AND CURETTAGE OF UTERUS  1980's   S/P miscarriage  . EXTERNAL FIXATION LEG Right 03/10/2019   Procedure: OPEN REDUCTION INTERNAL FIXATION RIGHT ANKLE;  Surgeon: Marchia Bond, MD;  Location: [facility];  Service: Orthopedics;  Laterality: Right;  Marland Kitchen VARICOSE VEIN SURGERY Left    left leg  . VIDEO BRONCHOSCOPY WITH ENDOBRONCHIAL NAVIGATION Right 08/25/2020   Procedure: VIDEO BRONCHOSCOPY WITH  ENDOBRONCHIAL NAVIGATION WITH FIDUCIAL PLACEMENT;  Surgeon: Garner Nash, DO;  Location: [facility];  Service: Pulmonary;  Laterality: Right;   Social History   Occupational History  . Not on file  Tobacco Use  . Smoking status: Former Smoker    Packs/day: 1.00    Years: 30.00    Pack years: 30.00    Types: Cigarettes    Quit date: 2005    Years since quitting: 17.2  . Smokeless tobacco: Never Used  Vaping  Use  . Vaping Use: Never used  Substance and Sexual Activity  . Alcohol use: No  . Drug use: No  . Sexual activity: Not Currently    Partners: Male    Birth control/protection: Post-menopausal

## 2021-02-02 ENCOUNTER — Telehealth: Payer: Self-pay | Admitting: Orthopedic Surgery

## 2021-02-02 NOTE — Telephone Encounter (Signed)
I called Ms. Lank to discuss scheduling her outpatient surgery.  Left a message for her to return my call.

## 2021-02-03 ENCOUNTER — Other Ambulatory Visit: Payer: Self-pay

## 2021-02-03 ENCOUNTER — Other Ambulatory Visit: Payer: Self-pay | Admitting: Physician Assistant

## 2021-02-03 ENCOUNTER — Other Ambulatory Visit (HOSPITAL_COMMUNITY)
Admission: RE | Admit: 2021-02-03 | Discharge: 2021-02-03 | Disposition: A | Discharge status: Home / Self Care | Payer: Medicare Other | Source: Ambulatory Visit | Attending: Orthopedic Surgery | Admitting: Orthopedic Surgery

## 2021-02-03 DIAGNOSIS — Z20822 Contact with and (suspected) exposure to covid-19: Secondary | ICD-10-CM | POA: Diagnosis not present

## 2021-02-03 DIAGNOSIS — Z01812 Encounter for preprocedural laboratory examination: Secondary | ICD-10-CM | POA: Diagnosis not present

## 2021-02-03 LAB — SARS CORONAVIRUS 2 (TAT 6-24 HRS): SARS Coronavirus 2: NEGATIVE

## 2021-02-04 ENCOUNTER — Other Ambulatory Visit: Payer: Self-pay

## 2021-02-04 ENCOUNTER — Encounter (HOSPITAL_COMMUNITY): Payer: Self-pay | Admitting: Orthopedic Surgery

## 2021-02-04 NOTE — Progress Notes (Signed)
Mrs. Julie Mann denies chest pain or shortness of breath. Patient was tested for Covid and has been in quarantine since that time. Mrs Julie Mann is on Aspirin- she will not take it in am.  Mrs Julie Mann has a history of palpations and PAF- patient takes Metoprolol when she has palpations, patient said that it does not happen often.

## 2021-02-05 ENCOUNTER — Encounter (HOSPITAL_COMMUNITY)
Admission: RE | Disposition: A | Discharge status: Home / Self Care | Payer: Self-pay | Source: Home / Self Care | Attending: Orthopedic Surgery

## 2021-02-05 ENCOUNTER — Other Ambulatory Visit: Payer: Self-pay

## 2021-02-05 ENCOUNTER — Ambulatory Visit (HOSPITAL_COMMUNITY): Payer: Medicare Other

## 2021-02-05 ENCOUNTER — Other Ambulatory Visit (HOSPITAL_COMMUNITY): Payer: Self-pay | Admitting: Orthopedic Surgery

## 2021-02-05 ENCOUNTER — Ambulatory Visit (HOSPITAL_COMMUNITY)
Admission: RE | Admit: 2021-02-05 | Discharge: 2021-02-05 | Disposition: A | Discharge status: Home / Self Care | Payer: Medicare Other | Attending: Orthopedic Surgery | Admitting: Orthopedic Surgery

## 2021-02-05 ENCOUNTER — Encounter (HOSPITAL_COMMUNITY): Payer: Self-pay | Admitting: Orthopedic Surgery

## 2021-02-05 DIAGNOSIS — T847XXA Infection and inflammatory reaction due to other internal orthopedic prosthetic devices, implants and grafts, initial encounter: Secondary | ICD-10-CM | POA: Diagnosis not present

## 2021-02-05 DIAGNOSIS — Z888 Allergy status to other drugs, medicaments and biological substances status: Secondary | ICD-10-CM | POA: Diagnosis not present

## 2021-02-05 DIAGNOSIS — Y831 Surgical operation with implant of artificial internal device as the cause of abnormal reaction of the patient, or of later complication, without mention of misadventure at the time of the procedure: Secondary | ICD-10-CM | POA: Diagnosis not present

## 2021-02-05 DIAGNOSIS — Z885 Allergy status to narcotic agent status: Secondary | ICD-10-CM | POA: Diagnosis not present

## 2021-02-05 DIAGNOSIS — Z87891 Personal history of nicotine dependence: Secondary | ICD-10-CM | POA: Insufficient documentation

## 2021-02-05 DIAGNOSIS — Z881 Allergy status to other antibiotic agents status: Secondary | ICD-10-CM | POA: Diagnosis not present

## 2021-02-05 DIAGNOSIS — J449 Chronic obstructive pulmonary disease, unspecified: Secondary | ICD-10-CM | POA: Diagnosis not present

## 2021-02-05 DIAGNOSIS — T84629A Infection and inflammatory reaction due to internal fixation device of unspecified bone of leg, initial encounter: Secondary | ICD-10-CM | POA: Diagnosis not present

## 2021-02-05 DIAGNOSIS — Z79899 Other long term (current) drug therapy: Secondary | ICD-10-CM | POA: Insufficient documentation

## 2021-02-05 DIAGNOSIS — Z7982 Long term (current) use of aspirin: Secondary | ICD-10-CM | POA: Diagnosis not present

## 2021-02-05 DIAGNOSIS — T8459XA Infection and inflammatory reaction due to other internal joint prosthesis, initial encounter: Secondary | ICD-10-CM | POA: Insufficient documentation

## 2021-02-05 DIAGNOSIS — I480 Paroxysmal atrial fibrillation: Secondary | ICD-10-CM | POA: Diagnosis not present

## 2021-02-05 DIAGNOSIS — T8484XA Pain due to internal orthopedic prosthetic devices, implants and grafts, initial encounter: Secondary | ICD-10-CM | POA: Diagnosis not present

## 2021-02-05 HISTORY — PX: HARDWARE REMOVAL: SHX979

## 2021-02-05 LAB — BASIC METABOLIC PANEL
Anion gap: 9 (ref 5–15)
BUN: 12 mg/dL (ref 8–23)
CO2: 26 mmol/L (ref 22–32)
Calcium: 9.7 mg/dL (ref 8.9–10.3)
Chloride: 103 mmol/L (ref 98–111)
Creatinine, Ser: 0.89 mg/dL (ref 0.44–1.00)
GFR, Estimated: >60 mL/min (ref >60)
Glucose, Bld: 93 mg/dL (ref 70–99)
Potassium: 4.2 mmol/L (ref 3.5–5.1)
Sodium: 138 mmol/L (ref 135–145)

## 2021-02-05 LAB — SURGICAL PCR SCREEN
MRSA, PCR: NEGATIVE
Staphylococcus aureus: NEGATIVE

## 2021-02-05 LAB — CBC
HCT: 45.2 % (ref 36.0–46.0)
Hemoglobin: 13.9 g/dL (ref 12.0–15.0)
MCH: 26.6 pg (ref 26.0–34.0)
MCHC: 30.8 g/dL (ref 30.0–36.0)
MCV: 86.6 fL (ref 80.0–100.0)
Platelets: 391 10*3/uL (ref 150–400)
RBC: 5.22 MIL/uL — ABNORMAL HIGH (ref 3.87–5.11)
RDW: 13.9 % (ref 11.5–15.5)
WBC: 7.5 10*3/uL (ref 4.0–10.5)
nRBC: 0 % (ref 0.0–0.2)

## 2021-02-05 SURGERY — REMOVAL, HARDWARE
Anesthesia: General | Laterality: Right

## 2021-02-05 MED ORDER — FENTANYL CITRATE (PF) 100 MCG/2ML IJ SOLN: 25.0000 ug | INTRAMUSCULAR | Status: DC | PRN

## 2021-02-05 MED ORDER — CHLORHEXIDINE GLUCONATE 0.12 % MT SOLN
15.0000 mL | Freq: Once | OROMUCOSAL | Status: AC
  Administered 2021-02-05: 15 mL via OROMUCOSAL
  Filled 2021-02-05: qty 15

## 2021-02-05 MED ORDER — MUPIROCIN 2 % EX OINT: 1.0000 "application " | TOPICAL_OINTMENT | Freq: Once | CUTANEOUS | Status: AC

## 2021-02-05 MED ORDER — LACTATED RINGERS IV SOLN: INTRAVENOUS | Status: DC

## 2021-02-05 MED ORDER — CEFAZOLIN SODIUM-DEXTROSE 2-3 GM-%(50ML) IV SOLR
INTRAVENOUS | Status: DC | PRN
  Administered 2021-02-05: 2 g via INTRAVENOUS

## 2021-02-05 MED ORDER — OXYCODONE-ACETAMINOPHEN 5-325 MG PO TABS
1.0000 | ORAL_TABLET | Freq: Once | ORAL | Status: AC
Start: 2021-02-05 — End: 2021-02-05
  Administered 2021-02-05: 1 via ORAL

## 2021-02-05 MED ORDER — PROPOFOL 10 MG/ML IV BOLUS
INTRAVENOUS | Status: DC | PRN
  Administered 2021-02-05: 150 mg via INTRAVENOUS

## 2021-02-05 MED ORDER — ONDANSETRON HCL 4 MG/2ML IJ SOLN: 4.0000 mg | Freq: Once | INTRAMUSCULAR | Status: DC | PRN

## 2021-02-05 MED ORDER — DEXAMETHASONE SODIUM PHOSPHATE 10 MG/ML IJ SOLN
INTRAMUSCULAR | Status: AC
  Filled 2021-02-05: qty 1

## 2021-02-05 MED ORDER — LIDOCAINE 2% (20 MG/ML) 5 ML SYRINGE
INTRAMUSCULAR | Status: AC
  Filled 2021-02-05: qty 5

## 2021-02-05 MED ORDER — CLINDAMYCIN PHOSPHATE 900 MG/50ML IV SOLN
INTRAVENOUS | Status: AC
  Filled 2021-02-05: qty 50

## 2021-02-05 MED ORDER — ORAL CARE MOUTH RINSE: 15.0000 mL | Freq: Once | OROMUCOSAL | Status: AC

## 2021-02-05 MED ORDER — OXYCODONE-ACETAMINOPHEN 5-325 MG PO TABS
ORAL_TABLET | ORAL | Status: AC
  Filled 2021-02-05: qty 1

## 2021-02-05 MED ORDER — LIDOCAINE 2% (20 MG/ML) 5 ML SYRINGE
INTRAMUSCULAR | Status: DC | PRN
  Administered 2021-02-05: 50 mg via INTRAVENOUS

## 2021-02-05 MED ORDER — ONDANSETRON HCL 4 MG/2ML IJ SOLN
INTRAMUSCULAR | Status: DC | PRN
  Administered 2021-02-05: 4 mg via INTRAVENOUS

## 2021-02-05 MED ORDER — SODIUM CHLORIDE 0.9 % IR SOLN
Status: DC | PRN
  Administered 2021-02-05: 1000 mL

## 2021-02-05 MED ORDER — FENTANYL CITRATE (PF) 250 MCG/5ML IJ SOLN
INTRAMUSCULAR | Status: AC
  Filled 2021-02-05: qty 5

## 2021-02-05 MED ORDER — MUPIROCIN 2 % EX OINT
TOPICAL_OINTMENT | CUTANEOUS | Status: AC
  Administered 2021-02-05: 1 via TOPICAL
  Filled 2021-02-05: qty 22

## 2021-02-05 MED ORDER — HYDROCODONE-ACETAMINOPHEN 5-325 MG PO TABS: 1.0000 | ORAL_TABLET | ORAL | 0 refills | Status: DC | PRN

## 2021-02-05 MED ORDER — MIDAZOLAM HCL 2 MG/2ML IJ SOLN
INTRAMUSCULAR | Status: AC
  Filled 2021-02-05: qty 2

## 2021-02-05 MED ORDER — EPHEDRINE SULFATE 50 MG/ML IJ SOLN
INTRAMUSCULAR | Status: DC | PRN
Start: 2021-02-05 — End: 2021-02-05
  Administered 2021-02-05 (×2): 10 mg via INTRAVENOUS

## 2021-02-05 MED ORDER — FENTANYL CITRATE (PF) 100 MCG/2ML IJ SOLN
INTRAMUSCULAR | Status: DC | PRN
  Administered 2021-02-05 (×2): 50 ug via INTRAVENOUS

## 2021-02-05 MED ORDER — PHENYLEPHRINE 40 MCG/ML (10ML) SYRINGE FOR IV PUSH (FOR BLOOD PRESSURE SUPPORT)
PREFILLED_SYRINGE | INTRAVENOUS | Status: AC
  Filled 2021-02-05: qty 10

## 2021-02-05 MED ORDER — EPHEDRINE 5 MG/ML INJ
INTRAVENOUS | Status: AC
  Filled 2021-02-05: qty 10

## 2021-02-05 MED ORDER — FENTANYL CITRATE (PF) 100 MCG/2ML IJ SOLN
INTRAMUSCULAR | Status: AC
  Filled 2021-02-05: qty 2

## 2021-02-05 MED ORDER — ONDANSETRON HCL 4 MG/2ML IJ SOLN
INTRAMUSCULAR | Status: AC
  Filled 2021-02-05: qty 2

## 2021-02-05 MED ORDER — DEXAMETHASONE SODIUM PHOSPHATE 10 MG/ML IJ SOLN
INTRAMUSCULAR | Status: DC | PRN
  Administered 2021-02-05: 10 mg via INTRAVENOUS

## 2021-02-05 SURGICAL SUPPLY — 45 items
BANDAGE ESMARK 6X9 LF (GAUZE/BANDAGES/DRESSINGS) IMPLANT
BNDG CMPR 9X6 STRL LF SNTH (GAUZE/BANDAGES/DRESSINGS)
BNDG COHESIVE 4X5 TAN STRL (GAUZE/BANDAGES/DRESSINGS) ×1 IMPLANT
BNDG ESMARK 6X9 LF (GAUZE/BANDAGES/DRESSINGS)
BNDG GAUZE ELAST 4 BULKY (GAUZE/BANDAGES/DRESSINGS) ×2 IMPLANT
COVER SURGICAL LIGHT HANDLE (MISCELLANEOUS) ×4 IMPLANT
COVER WAND RF STERILE (DRAPES) ×2 IMPLANT
CUFF TOURN SGL QUICK 34 (TOURNIQUET CUFF)
CUFF TOURN SGL QUICK 42 (TOURNIQUET CUFF) IMPLANT
CUFF TRNQT CYL 34X4.125X (TOURNIQUET CUFF) IMPLANT
DRAPE C-ARM 42X72 X-RAY (DRAPES) IMPLANT
DRAPE INCISE IOBAN 66X45 STRL (DRAPES) IMPLANT
DRAPE ORTHO SPLIT 77X108 STRL (DRAPES)
DRAPE SURG ORHT 6 SPLT 77X108 (DRAPES) IMPLANT
DRAPE U-SHAPE 47X51 STRL (DRAPES) ×2 IMPLANT
DRSG ADAPTIC 3X8 NADH LF (GAUZE/BANDAGES/DRESSINGS) ×1 IMPLANT
DRSG EMULSION OIL 3X3 NADH (GAUZE/BANDAGES/DRESSINGS) ×2 IMPLANT
DRSG PAD ABDOMINAL 8X10 ST (GAUZE/BANDAGES/DRESSINGS) ×2 IMPLANT
DURAPREP 26ML APPLICATOR (WOUND CARE) ×2 IMPLANT
ELECT REM PT RETURN 9FT ADLT (ELECTROSURGICAL) ×2
ELECTRODE REM PT RTRN 9FT ADLT (ELECTROSURGICAL) ×1 IMPLANT
GAUZE SPONGE 4X4 12PLY STRL (GAUZE/BANDAGES/DRESSINGS) ×2 IMPLANT
GLOVE BIOGEL PI IND STRL 9 (GLOVE) ×1 IMPLANT
GLOVE BIOGEL PI INDICATOR 9 (GLOVE) ×1
GLOVE SURG ORTHO 9.0 STRL STRW (GLOVE) ×2 IMPLANT
GOWN STRL REUS W/ TWL XL LVL3 (GOWN DISPOSABLE) ×3 IMPLANT
GOWN STRL REUS W/TWL XL LVL3 (GOWN DISPOSABLE) ×6
KIT BASIN OR (CUSTOM PROCEDURE TRAY) ×2 IMPLANT
KIT TURNOVER KIT B (KITS) ×2 IMPLANT
MANIFOLD NEPTUNE II (INSTRUMENTS) ×2 IMPLANT
NS IRRIG 1000ML POUR BTL (IV SOLUTION) ×2 IMPLANT
PACK ORTHO EXTREMITY (CUSTOM PROCEDURE TRAY) ×2 IMPLANT
PAD ARMBOARD 7.5X6 YLW CONV (MISCELLANEOUS) ×4 IMPLANT
SPONGE LAP 18X18 RF (DISPOSABLE) IMPLANT
STAPLER VISISTAT 35W (STAPLE) IMPLANT
STOCKINETTE IMPERVIOUS 9X36 MD (GAUZE/BANDAGES/DRESSINGS) IMPLANT
SUT ETHILON 2 0 PSLX (SUTURE) IMPLANT
SUT VIC AB 0 CT1 27 (SUTURE)
SUT VIC AB 0 CT1 27XBRD ANBCTR (SUTURE) IMPLANT
SUT VIC AB 2-0 CT1 27 (SUTURE)
SUT VIC AB 2-0 CT1 TAPERPNT 27 (SUTURE) IMPLANT
TOWEL GREEN STERILE (TOWEL DISPOSABLE) ×2 IMPLANT
TOWEL GREEN STERILE FF (TOWEL DISPOSABLE) ×2 IMPLANT
UNDERPAD 30X36 HEAVY ABSORB (UNDERPADS AND DIAPERS) ×2 IMPLANT
WATER STERILE IRR 1000ML POUR (IV SOLUTION) ×2 IMPLANT

## 2021-02-05 NOTE — Anesthesia Preprocedure Evaluation (Addendum)
Anesthesia Evaluation  Patient identified by MRN, date of birth, ID band Patient awake    Reviewed: Allergy & Precautions, NPO status , Patient's Chart, lab work & pertinent test results, reviewed documented beta blocker date and time   Airway Mallampati: I  TM Distance: >3 FB Neck ROM: Full    Dental  (+) Caps,    Pulmonary shortness of breath and with exertion, COPD,  COPD inhaler, former smoker,    breath sounds clear to auscultation       Cardiovascular + dysrhythmias Atrial Fibrillation  Rhythm:Regular Rate:Normal  EKG 11/2020 NSR, borderline 1st degree AV Block  Echo 11/17/16 Left ventricle: The cavity size was normal. Systolic function was vigorous. The estimated ejection fraction was in the range of 65% to 70%. Wall motion was normal; there were no regional wall motion abnormalities. Features are consistent with a pseudonormal left ventricular filling pattern, with concomitant abnormal relaxation and increased filling pressure (grade 2 diastolic dysfunction). Doppler parameters are consistent with indeterminate ventricular filling pressure.  - Aortic valve: Transvalvular velocity was within the normal range. There was no stenosis. There was no regurgitation.  - Mitral valve: Transvalvular velocity was within the normal range. There was no evidence for stenosis. There was trivial regurgitation.  - Right ventricle: The cavity size was normal. Wall thickness was normal. Systolic function was normal.  - Tricuspid valve: There was trivial regurgitation.  - Pulmonary arteries: Systolic pressure was within the normal range. PA peak pressure: 27 mm Hg (S).    Neuro/Psych PSYCHIATRIC DISORDERS Anxiety Depression negative neurological ROS     GI/Hepatic Neg liver ROS, PUD, GERD  Medicated and Controlled,Hx/o anal Ca   Endo/Other  Hyperlipidemia  Renal/GU negative Renal ROS  negative genitourinary    Musculoskeletal  (+) Arthritis , Osteoarthritis,  Painful hardware right ankle   Abdominal Normal abdominal exam  (+)   Peds  Hematology negative hematology ROS (+)   Anesthesia Other Findings   Reproductive/Obstetrics                           Anesthesia Physical  Anesthesia Plan  ASA: III  Anesthesia Plan: General   Post-op Pain Management:    Induction: Intravenous  PONV Risk Score and Plan: 4 or greater and Ondansetron and Treatment may vary due to age or medical condition  Airway Management Planned: LMA  Additional Equipment: None  Intra-op Plan:   Post-operative Plan: Extubation in OR  Informed Consent: I have reviewed the patients History and Physical, chart, labs and discussed the procedure including the risks, benefits and alternatives for the proposed anesthesia with the patient or authorized representative who has indicated his/her understanding and acceptance.     Dental advisory given  Plan Discussed with: CRNA and Anesthesiologist  Anesthesia Plan Comments:         Anesthesia Quick Evaluation

## 2021-02-05 NOTE — H&P (Signed)
Julie Mann is an 75 y.o. female.   Chief Complaint: Painful Hardware Right Ankle HPI: Patient is an active pleasant 75 year old woman with a chief complaint of right lateral ankle pain she is status post ORIF of a trimalleolar ankle fracture 2 years ago.  She had an injection last summer which seemed to help quite a bit of her ankle pain.  She did have a return of some symptoms and was injected approximately 3 weeks ago.  She said that injection did not help her.  She focuses her concerns over the lateral side of the ankle and the lateral plate.  She said this had some mild redness a week ago but is now improved.  She is in the process of moving but also enjoys walking and being active.  She inquires if perhaps the hardware could be removed  Past Medical History:  Diagnosis Date  . 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 Anxiety   . Arthritis    "hands, fingers, back" (02/27/2014)  . Atrial fibrillation ([place])   . Cat scratch of right hand 02/24/2014  . COPD (chronic obstructive pulmonary disease) ([facility])   . Dyspnea    walking up hill;.  stairs (If Iam tired)  . Dysrhythmia    PAF  . Fall from horse 01/2018  . Fracture, ankle 02/2019   Right   . High cholesterol   . History of stomach ulcers ~ 1966  . Osteopenia     Past Surgical History:  Procedure Laterality Date  . ANUS SURGERY  2005   "biopsy"  . BREAST CYST EXCISION Right 1966  . DILATION AND CURETTAGE OF UTERUS  1980's   S/P miscarriage  . EXTERNAL FIXATION LEG Right 03/10/2019   Procedure: OPEN REDUCTION INTERNAL FIXATION RIGHT ANKLE;  Surgeon: Marchia Bond, MD;  Location: [facility];  Service: Orthopedics;  Laterality: Right;  Marland Kitchen VARICOSE VEIN SURGERY Left    left leg  . VIDEO BRONCHOSCOPY WITH ENDOBRONCHIAL NAVIGATION Right 08/25/2020   Procedure: VIDEO BRONCHOSCOPY WITH ENDOBRONCHIAL NAVIGATION WITH FIDUCIAL PLACEMENT;  Surgeon: Garner Nash, DO;  Location: [facility];  Service: Pulmonary;   Laterality: Right;    Family History  Problem Relation Age of Onset  . Breast cancer Maternal Aunt   . Breast cancer Maternal Grandmother   . Heart disease Mother   . Stroke Father   . Rectal cancer Paternal Grandmother   . Breast cancer Sister 84  . Breast cancer Sister 45   Social History:  reports that she quit smoking about 17 years ago. Her smoking use included cigarettes. She has a 30.00 pack-year smoking history. She has never used smokeless tobacco. She reports that she does not drink alcohol and does not use drugs.  Allergies:  Allergies  Allergen Reactions  . Eliquis [Apixaban] Anaphylaxis    Made her feel sick  . Ivp Dye [Iodinated Diagnostic Agents] Swelling  . Oxycodone Other (See Comments)    Went into Afib.   . Tiotropium Bromide Monohydrate Other (See Comments)    Headache, Blurry vision, chest heaviness.  . Conjugated Estrogens     Felt weird Other reaction(s): hallucinations  . Doxycycline Other (See Comments)    Dizziness and tingling  . Erythromycin Other (See Comments)    Hallucinations   . Flonase [Fluticasone Propionate]   . Keflex [Cephalexin] Other (See Comments)    Hallucinations.  . Other     Other reaction(s): hallucinations Other reaction(s): sedation Other reaction(s): hallucinations  .  Paxil [Paroxetine Hcl] Diarrhea  . Provera [Medroxyprogesterone Acetate]     Patient do not remember reaction  . Provera [Medroxyprogesterone]     Other reaction(s): hallucinations  . Xarelto [Rivaroxaban]     Made her feel sick  . Zithromax [Azithromycin] Other (See Comments)    hallucinations  . Zoloft [Sertraline Hcl] Other (See Comments)    sedation  . Zoloft [Sertraline]     Other reaction(s): sedation    Medications Prior to Admission  Medication Sig Dispense Refill  . albuterol (VENTOLIN HFA) 108 (90 Base) MCG/ACT inhaler Inhale 2 puffs into the lungs every 6 (six) hours as needed for wheezing or shortness of breath. 8 g 6  . APPLE CIDER  VINEGAR PO Take 5 mLs by mouth daily as needed (Constipation).    Marland Kitchen aspirin EC 81 MG tablet Take 1 tablet (81 mg total) by mouth daily. 90 tablet 3  . atorvastatin (LIPITOR) 10 MG tablet Take 10 mg by mouth 2 (two) times a week. Monday and thursday  12  . ibuprofen (ADVIL) 600 MG tablet Take 1 tablet (600 mg total) by mouth daily as needed for moderate pain. 30 tablet 2  . LORazepam (ATIVAN) 1 MG tablet Take 0.5 tablets (0.5 mg total) by mouth at bedtime. 30 tablet 2  . metoprolol tartrate (LOPRESSOR) 25 MG tablet Take 25 mg by mouth daily as needed (palpitations/AFIB).     . Multiple Vitamins-Minerals (MULTIVITAMIN PO) Take 1 tablet by mouth daily. One a day    . omeprazole (PRILOSEC OTC) 20 MG tablet Take 20 mg by mouth daily as needed (Upset Stomach).    . OVER THE COUNTER MEDICATION Take 5 mLs by mouth daily as needed (Constipation). Olive oil    . Propylene Glycol-Glycerin 0.6-0.6 % SOLN Place 1 drop into both eyes 2 (two) times daily. Soothe Dry eyes      Results for orders placed or performed during the hospital encounter of 02/03/21 (from the past 48 hour(s))  SARS CORONAVIRUS 2 (TAT 6-24 HRS) Nasopharyngeal Nasopharyngeal Swab     Status: None   Collection Time: 02/03/21  8:38 AM   Specimen: Nasopharyngeal Swab  Result Value Ref Range   SARS Coronavirus 2 NEGATIVE NEGATIVE    Comment: (NOTE) SARS-CoV-2 target nucleic acids are NOT DETECTED.  The SARS-CoV-2 RNA is generally detectable in upper and lower respiratory specimens during the acute phase of infection. Negative results do not preclude SARS-CoV-2 infection, do not rule out co-infections with other pathogens, and should not be used as the sole basis for treatment or other patient management decisions. Negative results must be combined with clinical observations, patient history, and epidemiological information. The expected result is Negative.  Fact Sheet for Patients: SugarRoll.be  Fact  Sheet for Healthcare Providers: https://www.woods-mathews.com/  This test is not yet approved or cleared by the [place] FDA and  has been authorized for detection and/or diagnosis of SARS-CoV-2 by FDA under an Emergency Use Authorization (EUA). This EUA will remain  in effect (meaning this test can be used) for the duration of the COVID-19 declaration under Se ction 564(b)(1) of the Act, 21 U.S.C. section 360bbb-3(b)(1), unless the authorization is terminated or revoked sooner.  Performed at [facility], [place] [address] [place], [place] [address]    No results found.  Review of Systems  All other systems reviewed and are negative.   Last menstrual period 11/14/1992. Physical Exam  Patient is alert, oriented, no adenopathy, well-dressed, normal affect, normal respiratory effort. Right  ankle she has some bruising on the anterior lateral aspect.  She is tender over the lateral hardware.  Does not have much tenderness with ankle range of motion.  Has good plantar flexion dorsiflexion eversion inversion no tenderness along peroneal tendonsHeart RRR Lungs Clear Assessment/Plan X-rays of her right ankle were reviewed today.  She is status post ORIF of a trimalleolar ankle fracture.  Hardware is intact and in place.  She does have some loss of joint space in the tibiotalar joint.  No acute osseous changes no significant changes from x-rays a year ago   [place], [place] 02/05/2021, 6:08 AM

## 2021-02-05 NOTE — Transfer of Care (Signed)
Immediate Anesthesia Transfer of Care Note  Patient: Julie Mann [facility]  Procedure(s) Performed: REMOVAL OF HARDWARE RIGHT ANKLE (Right )  Patient Location: PACU  Anesthesia Type:General  Level of Consciousness: drowsy  Airway & Oxygen Therapy: Patient Spontanous Breathing and Patient connected to face mask oxygen  Post-op Assessment: Report given to RN and Post -op Vital signs reviewed and stable  Post vital signs: Reviewed and stable  Last Vitals:  Vitals Value Taken Time  BP    Temp    Pulse 73 02/05/21 0939  Resp 13 02/05/21 0939  SpO2 100 % 02/05/21 0939  Vitals shown include unvalidated device data.  Last Pain:  Vitals:   02/05/21 0721  TempSrc:   PainSc: 0-No pain         Complications: No complications documented.

## 2021-02-05 NOTE — Interval H&P Note (Signed)
History and Physical Interval Note:  02/05/2021 6:42 AM  Julie Mann  has presented today for surgery, with the diagnosis of Painful Hardware Right Ankle.  The various methods of treatment have been discussed with the patient and family. After consideration of risks, benefits and other options for treatment, the patient has consented to  Procedure(s): REMOVAL OF HARDWARE RIGHT ANKLE (Right) as a surgical intervention.  The patient's history has been reviewed, patient examined, no change in status, stable for surgery.  I have reviewed the patient's chart and labs.  Questions were answered to the patient's satisfaction.     Newt Minion

## 2021-02-05 NOTE — Progress Notes (Signed)
Orthopedic Tech Progress Note Patient Details:  Julie Mann [facility] Jan 08, 1946 [id number]  Ortho Devices Type of Ortho Device: Postop shoe/boot Ortho Device/Splint Location: RLE Ortho Device/Splint Interventions: Ordered,Application,Adjustment   Post Interventions Patient Tolerated: Well Instructions Provided: Adjustment of device,Care of device,Poper ambulation with device   Julie Mann 02/05/2021, 12:52 PM

## 2021-02-05 NOTE — Anesthesia Postprocedure Evaluation (Signed)
Anesthesia Post Note  Patient: Julie Mann [facility]  Procedure(s) Performed: REMOVAL OF HARDWARE RIGHT ANKLE (Right )     Anesthesia Post Evaluation No complications documented.  Last Vitals:  Vitals:   02/05/21 0940 02/05/21 0955  BP: (!) 124/50 123/79  Pulse: 73 81  Resp: 13 16  Temp: (!) 36.2 C   SpO2: 100% 96%    Last Pain:  Vitals:   02/05/21 0940  TempSrc:   PainSc: 0-No pain                 Tamario Heal A.

## 2021-02-05 NOTE — Anesthesia Procedure Notes (Signed)
Procedure Name: LMA Insertion Date/Time: 02/05/2021 8:58 AM Performed by: Hoy Morn, CRNA Pre-anesthesia Checklist: Patient identified, Emergency Drugs available, Suction available and Patient being monitored Patient Re-evaluated:Patient Re-evaluated prior to induction Oxygen Delivery Method: Circle system utilized Preoxygenation: Pre-oxygenation with 100% oxygen Induction Type: IV induction Ventilation: Mask ventilation without difficulty LMA: LMA inserted LMA Size: 4.0 Number of attempts: 1 Placement Confirmation: positive ETCO2 and breath sounds checked- equal and bilateral Tube secured with: Tape Dental Injury: Teeth and Oropharynx as per pre-operative assessment

## 2021-02-05 NOTE — Op Note (Signed)
02/05/2021  9:29 AM  PATIENT:  Julie Mann    PRE-OPERATIVE DIAGNOSIS: Infected hardware Right Ankle  POST-OPERATIVE DIAGNOSIS:  Same  PROCEDURE:  REMOVAL OF HARDWARE RIGHT ANKLE Soft tissues sent for cultures.  SURGEON:  Newt Minion, MD  PHYSICIAN ASSISTANT:None ANESTHESIA:   General  PREOPERATIVE INDICATIONS:  Julie Mann is a  75 y.o. female with a diagnosis of Painful Hardware Right Ankle who failed conservative measures and elected for surgical management.    The risks benefits and alternatives were discussed with the patient preoperatively including but not limited to the risks of infection, bleeding, nerve injury, cardiopulmonary complications, the need for revision surgery, among others, and the patient was willing to proceed.  OPERATIVE IMPLANTS: None  @ENCIMAGES @  OPERATIVE FINDINGS: There was some small amount of fibrinous tissue beneath the plate and a small amount of drainage which was both sent for cultures.  No deep purulent abscess.  OPERATIVE PROCEDURE: Patient was brought the operating room and underwent a general anesthetic.  After adequate levels anesthesia were obtained patient's right lower extremity was prepped using DuraPrep draped into a sterile field a timeout was called.  Her previous lateral incision over the fibula was used this was carried down to the plate a periosteal elevator was used to elevate the fibrous tissue off the plate.  The screws including the interfrag screw were removed the plate was removed.  The fibrinous tissue and fluid beneath the plate was debrided with a periosteal elevator and a rondure and sent for cultures.  The wound was irrigated with normal saline the incision was closed using 2-0 nylon a sterile dressing was applied patient was extubated taken the PACU in stable condition   DISCHARGE PLANNING:  Antibiotic duration: Preoperative antibiotics  Weightbearing: Weightbearing as tolerated in the postoperative shoe  Pain  medication: Vicodin  Dressing care/ Wound VAC: Dry dressing  Ambulatory devices: Crutches  Discharge to: Home.  Follow-up: In the office 1 week post operative.

## 2021-02-08 ENCOUNTER — Other Ambulatory Visit: Payer: Self-pay | Admitting: Orthopedic Surgery

## 2021-02-08 ENCOUNTER — Telehealth: Payer: Self-pay | Admitting: Orthopedic Surgery

## 2021-02-08 MED ORDER — AMOXICILLIN-POT CLAVULANATE 875-125 MG PO TABS: 1.0000 | ORAL_TABLET | Freq: Two times a day (BID) | ORAL | 0 refills | Status: DC

## 2021-02-08 NOTE — Telephone Encounter (Signed)
Please call patient her cultures are positive for staph epi.  Prescription called in to [facility] for Augmentin.

## 2021-02-08 NOTE — Telephone Encounter (Signed)
I called pt and advised of message. Offered to make an appt for follow up but pt said that she will need to call back as she had inspectors at her home currently. Voiced understanding of the medication and will call to sch follow up as soon as she is free.

## 2021-02-09 ENCOUNTER — Encounter (HOSPITAL_COMMUNITY): Payer: Self-pay | Admitting: Orthopedic Surgery

## 2021-02-09 ENCOUNTER — Ambulatory Visit (INDEPENDENT_AMBULATORY_CARE_PROVIDER_SITE_OTHER): Payer: Medicare Other | Admitting: Physician Assistant

## 2021-02-09 DIAGNOSIS — M25571 Pain in right ankle and joints of right foot: Secondary | ICD-10-CM

## 2021-02-09 NOTE — [place] Notes (Signed)
Office Visit Note   Patient: Julie Mann           Date of Birth: 1946/02/01           MRN: [id number] Visit Date: 02/09/2021              Requested by: Lajean Manes, MD [address] [place],  [place] [address] PCP: Lajean Manes, MD  Chief Complaint  Patient presents with  . Right Ankle - Follow-up      HPI: Patient is 4 days status removal of hardware from right ankle.  Her cultures were positive for staph and she is currently taking Augmentin.  She discontinued the hydrocodone because it made her sick to her stomach.  She is not requesting any other pain medication  Assessment & Plan: Visit Diagnoses: No diagnosis found.  Plan: Continue daily cleansing with Dial soap and water keep area covered we will follow-up in 1 week  Follow-Up Instructions: No follow-ups on file.   Ortho Exam  Patient is alert, oriented, no adenopathy, well-dressed, normal affect, normal respiratory effort. Examination of her ankle demonstrates healing surgical incision with well opposed wound edges.  No ascending cellulitis or erythema.  Mild to moderate soft tissue swelling.  Wound edges are healthy  Imaging: No results found. No images are attached to the encounter.  Labs: Lab Results  Component Value Date   REPTSTATUS PENDING 02/05/2021   GRAMSTAIN  02/05/2021    MODERATE WBC PRESENT,BOTH PMN AND MONONUCLEAR NO ORGANISMS SEEN Performed at [facility], [place] [address] [place], [place] [address]    CULT  02/05/2021    RARE STAPHYLOCOCCUS CAPITIS RARE STAPHYLOCOCCUS EPIDERMIDIS NO ANAEROBES ISOLATED; CULTURE IN PROGRESS FOR 5 DAYS    LABORGA STAPHYLOCOCCUS CAPITIS 02/05/2021   LABORGA STAPHYLOCOCCUS EPIDERMIDIS 02/05/2021     Lab Results  Component Value Date   ALBUMIN 3.6 08/25/2020   ALBUMIN 4.1 03/24/2009   ALBUMIN 3.9 09/24/2008    No results found for: MG No results found for: VD25OH  No results found for: PREALBUMIN CBC EXTENDED Latest Ref  Rng & Units 02/05/2021 08/25/2020 03/10/2019  WBC 4.0 - 10.5 K/uL 7.5 6.6 13.1(H)  RBC 3.87 - 5.11 MIL/uL 5.22(H) 4.76 4.70  HGB 12.0 - 15.0 g/dL 13.9 12.6 12.8  HCT 36.0 - 46.0 % 45.2 40.5 40.5  PLT 150 - 400 K/uL 391 321 287  NEUTROABS 1.7 - 7.7 K/uL - - -  LYMPHSABS 0.7 - 4.0 K/uL - - -     There is no height or weight on file to calculate BMI.  Orders:  No orders of the defined types were placed in this encounter.  No orders of the defined types were placed in this encounter.    Procedures: No procedures performed  Clinical Data: No additional findings.  ROS:  All other systems negative, except as noted in the HPI. Review of Systems  Objective: Vital Signs: LMP 11/14/1992   Specialty Comments:  No specialty comments available.  PMFS History: Patient Active Problem List   Diagnosis Date Noted  . Hardware complicating wound infection ([place])   . Neck pain 10/05/2020  . Nodule of upper lobe of right lung 08/17/2020  . Biceps tendonosis of left shoulder 02/13/2020  . Impingement syndrome of left shoulder 02/13/2020  . Post-operative state 02/13/2020  . Ankle dislocation, right, initial encounter 03/10/2019  . Trimalleolar fracture of ankle, closed, right, initial encounter   . Chronic left shoulder pain 02/19/2019  . Anxiety 12/17/2016  .  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 Osteopenia 12/17/2016  . Paroxysmal atrial fibrillation ([place]) 12/17/2016  . Pure hypercholesterolemia 12/17/2016  . Rectal cancer ([place]) 07/30/2015   Past Medical History:  Diagnosis Date  . 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 Anxiety   . Arthritis    "hands, fingers, back" (02/27/2014)  . Atrial fibrillation ([place])   . Cat scratch of right hand 02/24/2014  . COPD (chronic obstructive pulmonary disease) ([place])   . Dyspnea    walking up hill;.  stairs (If Iam tired)  . Dysrhythmia    PAF  . Fall from horse 01/2018  . Fracture, ankle 02/2019    Right   . High cholesterol   . History of stomach ulcers ~ 1966  . Osteopenia     Family History  Problem Relation Age of Onset  . Breast cancer Maternal Aunt   . Breast cancer Maternal Grandmother   . Heart disease Mother   . Stroke Father   . Rectal cancer Paternal Grandmother   . Breast cancer Sister 25  . Breast cancer Sister 59    Past Surgical History:  Procedure Laterality Date  . ANUS SURGERY  2005   "biopsy"  . BREAST CYST EXCISION Right 1966  . DILATION AND CURETTAGE OF UTERUS  1980's   S/P miscarriage  . EXTERNAL FIXATION LEG Right 03/10/2019   Procedure: OPEN REDUCTION INTERNAL FIXATION RIGHT ANKLE;  Surgeon: Marchia Bond, MD;  Location: [facility];  Service: Orthopedics;  Laterality: Right;  . HARDWARE REMOVAL Right 02/05/2021   Procedure: REMOVAL OF HARDWARE RIGHT ANKLE;  Surgeon: Newt Minion, MD;  Location: [facility];  Service: Orthopedics;  Laterality: Right;  Marland Kitchen VARICOSE VEIN SURGERY Left    left leg  . VIDEO BRONCHOSCOPY WITH ENDOBRONCHIAL NAVIGATION Right 08/25/2020   Procedure: VIDEO BRONCHOSCOPY WITH ENDOBRONCHIAL NAVIGATION WITH FIDUCIAL PLACEMENT;  Surgeon: Garner Nash, DO;  Location: [facility];  Service: Pulmonary;  Laterality: Right;   Social History   Occupational History  . Not on file  Tobacco Use  . Smoking status: Former Smoker    Packs/day: 1.00    Years: 30.00    Pack years: 30.00    Types: Cigarettes    Quit date: 2005    Years since quitting: 17.2  . Smokeless tobacco: Never Used  Vaping Use  . Vaping Use: Never used  Substance and Sexual Activity  . Alcohol use: No  . Drug use: No  . Sexual activity: Not Currently    Partners: Male    Birth control/protection: Post-menopausal

## 2021-02-10 LAB — AEROBIC/ANAEROBIC CULTURE W GRAM STAIN (SURGICAL/DEEP WOUND)

## 2021-02-11 ENCOUNTER — Other Ambulatory Visit: Payer: Self-pay | Admitting: Orthopedic Surgery

## 2021-02-11 DIAGNOSIS — K219 Gastro-esophageal reflux disease without esophagitis: Secondary | ICD-10-CM | POA: Diagnosis not present

## 2021-02-11 DIAGNOSIS — I480 Paroxysmal atrial fibrillation: Secondary | ICD-10-CM | POA: Diagnosis not present

## 2021-02-11 DIAGNOSIS — J449 Chronic obstructive pulmonary disease, unspecified: Secondary | ICD-10-CM | POA: Diagnosis not present

## 2021-02-11 DIAGNOSIS — E7800 Pure hypercholesterolemia, unspecified: Secondary | ICD-10-CM | POA: Diagnosis not present

## 2021-02-11 MED ORDER — CIPROFLOXACIN HCL 500 MG PO TABS: 500.0000 mg | ORAL_TABLET | Freq: Two times a day (BID) | ORAL | 0 refills | Status: DC

## 2021-02-12 ENCOUNTER — Telehealth: Payer: Self-pay

## 2021-02-12 NOTE — Telephone Encounter (Signed)
Spoke with patient. She got severe nausea and vomiting with Cipro. She would like to continue on the Augmentin though she knows it doesn't cover both organisms. Will follow up in office next week

## 2021-02-12 NOTE — Telephone Encounter (Signed)
Patient called stating that she had a reaction to the Cipro that she was prescribed and was told by her pharmacist to try Doxycycline.  CB# [phone] or [phone].  Please advise.  Thank you

## 2021-02-16 ENCOUNTER — Ambulatory Visit (INDEPENDENT_AMBULATORY_CARE_PROVIDER_SITE_OTHER): Payer: Medicare Other | Admitting: Physician Assistant

## 2021-02-16 ENCOUNTER — Encounter: Payer: Self-pay | Admitting: Orthopedic Surgery

## 2021-02-16 DIAGNOSIS — M25571 Pain in right ankle and joints of right foot: Secondary | ICD-10-CM

## 2021-02-16 NOTE — [place] Notes (Signed)
Office Visit Note   Patient: Julie Mann           Date of Birth: 1946-06-17           MRN: [id number] Visit Date: 02/16/2021              Requested by: Lajean Manes, MD [address] [place],  [place] [address] PCP: Lajean Manes, MD  Chief Complaint  Patient presents with  . Right Ankle - Routine Post Op    02/05/21 removal hardware right ankle.       HPI: Patient is 2 weeks status post hardware removal from her right ankle.  Cultures grew out bacteria sensitive to Cipro.  Unfortunately she had significant nausea vomiting and diarrhea from the Cipro.  She did go back on the Augmentin and has been tolerating that.  She has been washing her ankle twice daily and applying Neosporin and alcohol Assessment & Plan: Visit Diagnoses: No diagnosis found.  Plan: I have measured her for size medium vive compression stocking.  She should change this daily.  I cautioned her against using too much Neosporin and alcohol.  She will follow-up in 1 week.  Follow-Up Instructions: No follow-ups on file.   Ortho Exam  Patient is alert, oriented, no adenopathy, well-dressed, normal affect, normal respiratory effort. Examination of her ankle sutures are intact no ascending cellulitis no foul odor is minimal serous drainage wound does appear somewhat damp and wet secondary to Neosporin.  Edges are opposed but not quite healed yet.  Imaging: No results found. No images are attached to the encounter.  Labs: Lab Results  Component Value Date   REPTSTATUS 02/10/2021 FINAL 02/05/2021   GRAMSTAIN  02/05/2021    MODERATE WBC PRESENT,BOTH PMN AND MONONUCLEAR NO ORGANISMS SEEN    CULT  02/05/2021    RARE STAPHYLOCOCCUS CAPITIS RARE STAPHYLOCOCCUS EPIDERMIDIS NO ANAEROBES ISOLATED Performed at [facility], [address], [place], [place] [address]    [place] 02/05/2021   LABORGA STAPHYLOCOCCUS EPIDERMIDIS 02/05/2021     Lab Results  Component  Value Date   ALBUMIN 3.6 08/25/2020   ALBUMIN 4.1 03/24/2009   ALBUMIN 3.9 09/24/2008    No results found for: MG No results found for: VD25OH  No results found for: PREALBUMIN CBC EXTENDED Latest Ref Rng & Units 02/05/2021 08/25/2020 03/10/2019  WBC 4.0 - 10.5 K/uL 7.5 6.6 13.1(H)  RBC 3.87 - 5.11 MIL/uL 5.22(H) 4.76 4.70  HGB 12.0 - 15.0 g/dL 13.9 12.6 12.8  HCT 36.0 - 46.0 % 45.2 40.5 40.5  PLT 150 - 400 K/uL 391 321 287  NEUTROABS 1.7 - 7.7 K/uL - - -  LYMPHSABS 0.7 - 4.0 K/uL - - -     There is no height or weight on file to calculate BMI.  Orders:  No orders of the defined types were placed in this encounter.  No orders of the defined types were placed in this encounter.    Procedures: No procedures performed  Clinical Data: No additional findings.  ROS:  All other systems negative, except as noted in the HPI. Review of Systems  Objective: Vital Signs: LMP 11/14/1992   Specialty Comments:  No specialty comments available.  PMFS History: Patient Active Problem List   Diagnosis Date Noted  . Hardware complicating wound infection ([place])   . Neck pain 10/05/2020  . Nodule of upper lobe of right lung 08/17/2020  . Biceps tendonosis of left shoulder 02/13/2020  . Impingement  syndrome of left shoulder 02/13/2020  . Post-operative state 02/13/2020  . Ankle dislocation, right, initial encounter 03/10/2019  . Trimalleolar fracture of ankle, closed, right, initial encounter   . Chronic left shoulder pain 02/19/2019  . Anxiety 12/17/2016  . 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 Osteopenia 12/17/2016  . Paroxysmal atrial fibrillation ([place]) 12/17/2016  . Pure hypercholesterolemia 12/17/2016  . Rectal cancer ([place]) 07/30/2015   Past Medical History:  Diagnosis Date  . 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 Anxiety   . Arthritis    "hands, fingers, back" (02/27/2014)  . Atrial fibrillation ([place])   . Cat scratch of right  hand 02/24/2014  . COPD (chronic obstructive pulmonary disease) ([place])   . Dyspnea    walking up hill;.  stairs (If Iam tired)  . Dysrhythmia    PAF  . Fall from horse 01/2018  . Fracture, ankle 02/2019   Right   . High cholesterol   . History of stomach ulcers ~ 1966  . Osteopenia     Family History  Problem Relation Age of Onset  . Breast cancer Maternal Aunt   . Breast cancer Maternal Grandmother   . Heart disease Mother   . Stroke Father   . Rectal cancer Paternal Grandmother   . Breast cancer Sister 65  . Breast cancer Sister 64    Past Surgical History:  Procedure Laterality Date  . ANUS SURGERY  2005   "biopsy"  . BREAST CYST EXCISION Right 1966  . DILATION AND CURETTAGE OF UTERUS  1980's   S/P miscarriage  . EXTERNAL FIXATION LEG Right 03/10/2019   Procedure: OPEN REDUCTION INTERNAL FIXATION RIGHT ANKLE;  Surgeon: Marchia Bond, MD;  Location: [facility];  Service: Orthopedics;  Laterality: Right;  . HARDWARE REMOVAL Right 02/05/2021   Procedure: REMOVAL OF HARDWARE RIGHT ANKLE;  Surgeon: Newt Minion, MD;  Location: [facility];  Service: Orthopedics;  Laterality: Right;  Marland Kitchen VARICOSE VEIN SURGERY Left    left leg  . VIDEO BRONCHOSCOPY WITH ENDOBRONCHIAL NAVIGATION Right 08/25/2020   Procedure: VIDEO BRONCHOSCOPY WITH ENDOBRONCHIAL NAVIGATION WITH FIDUCIAL PLACEMENT;  Surgeon: Garner Nash, DO;  Location: [facility];  Service: Pulmonary;  Laterality: Right;   Social History   Occupational History  . Not on file  Tobacco Use  . Smoking status: Former Smoker    Packs/day: 1.00    Years: 30.00    Pack years: 30.00    Types: Cigarettes    Quit date: 2005    Years since quitting: 17.2  . Smokeless tobacco: Never Used  Vaping Use  . Vaping Use: Never used  Substance and Sexual Activity  . Alcohol use: No  . Drug use: No  . Sexual activity: Not Currently    Partners: Male    Birth control/protection: Post-menopausal

## 2021-02-23 ENCOUNTER — Ambulatory Visit (INDEPENDENT_AMBULATORY_CARE_PROVIDER_SITE_OTHER): Payer: Medicare Other | Admitting: Physician Assistant

## 2021-02-23 ENCOUNTER — Other Ambulatory Visit (HOSPITAL_COMMUNITY): Payer: Self-pay

## 2021-02-23 ENCOUNTER — Other Ambulatory Visit: Payer: Self-pay | Admitting: Obstetrics & Gynecology

## 2021-02-23 ENCOUNTER — Encounter: Payer: Self-pay | Admitting: Orthopedic Surgery

## 2021-02-23 DIAGNOSIS — F419 Anxiety disorder, unspecified: Secondary | ICD-10-CM

## 2021-02-23 DIAGNOSIS — M25571 Pain in right ankle and joints of right foot: Secondary | ICD-10-CM

## 2021-02-23 MED ORDER — LORAZEPAM 1 MG PO TABS
1.0000 mg | ORAL_TABLET | Freq: Every evening | ORAL | 2 refills | Status: DC | PRN
  Filled 2021-02-23: qty 30, 30d supply, fill #0
  Filled 2021-04-19: qty 30, 30d supply, fill #1
  Filled 2021-06-11: qty 30, 30d supply, fill #0

## 2021-02-23 NOTE — [place] Notes (Signed)
Office Visit Note   Patient: Julie Mann           Date of Birth: 1946-07-31           MRN: [id number] Visit Date: 02/23/2021              Requested by: Lajean Manes, MD [address] [place] [address] [address] [place],  [place] [address] PCP: Lajean Manes, MD  Chief Complaint  Patient presents with  . Right Ankle - Routine Post Op    02/05/21 right ankle removal HDW       HPI: Patient is a pleasant 75 year old woman who is 3 weeks status post removal of hardware from her right ankle.  She did go to bacteria which was sensitive to Cipro but unfortunately she was unable to tolerate this.  She has been on Augmentin and tolerating this okay.  She has 1 pill left.  She does feel she is doing much better she is wearing her compression sock  Assessment & Plan: Visit Diagnoses: No diagnosis found.  Plan: She will finish the antibiotic will follow up in 2 weeks.  Sutures will be harvested today.  Follow-Up Instructions: No follow-ups on file.   Ortho Exam  Patient is alert, oriented, no adenopathy, well-dressed, normal affect, normal respiratory effort. Examination of her ankle demonstrates well approximated wound edges sutures are in place with some redness right around the sutures.  Swelling is significantly decreased.  No ascending cellulitis swelling is well controlled compartments are soft  Imaging: No results found. No images are attached to the encounter.  Labs: Lab Results  Component Value Date   REPTSTATUS 02/10/2021 FINAL 02/05/2021   GRAMSTAIN  02/05/2021    MODERATE WBC PRESENT,BOTH PMN AND MONONUCLEAR NO ORGANISMS SEEN    CULT  02/05/2021    RARE STAPHYLOCOCCUS CAPITIS RARE STAPHYLOCOCCUS EPIDERMIDIS NO ANAEROBES ISOLATED Performed at [facility], [address], [place], [place] [address]    [place] 02/05/2021   LABORGA STAPHYLOCOCCUS EPIDERMIDIS 02/05/2021     Lab Results  Component Value Date   ALBUMIN 3.6 08/25/2020   ALBUMIN  4.1 03/24/2009   ALBUMIN 3.9 09/24/2008    No results found for: MG No results found for: VD25OH  No results found for: PREALBUMIN CBC EXTENDED Latest Ref Rng & Units 02/05/2021 08/25/2020 03/10/2019  WBC 4.0 - 10.5 K/uL 7.5 6.6 13.1(H)  RBC 3.87 - 5.11 MIL/uL 5.22(H) 4.76 4.70  HGB 12.0 - 15.0 g/dL 13.9 12.6 12.8  HCT 36.0 - 46.0 % 45.2 40.5 40.5  PLT 150 - 400 K/uL 391 321 287  NEUTROABS 1.7 - 7.7 K/uL - - -  LYMPHSABS 0.7 - 4.0 K/uL - - -     There is no height or weight on file to calculate BMI.  Orders:  No orders of the defined types were placed in this encounter.  No orders of the defined types were placed in this encounter.    Procedures: No procedures performed  Clinical Data: No additional findings.  ROS:  All other systems negative, except as noted in the HPI. Review of Systems  Objective: Vital Signs: LMP 11/14/1992   Specialty Comments:  No specialty comments available.  PMFS History: Patient Active Problem List   Diagnosis Date Noted  . Hardware complicating wound infection ([place])   . Neck pain 10/05/2020  . Nodule of upper lobe of right lung 08/17/2020  . Biceps tendonosis of left shoulder 02/13/2020  . Impingement syndrome of left shoulder 02/13/2020  .  Post-operative state 02/13/2020  . Ankle dislocation, right, initial encounter 03/10/2019  . Trimalleolar fracture of ankle, closed, right, initial encounter   . Chronic left shoulder pain 02/19/2019  . Anxiety 12/17/2016  . 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 Osteopenia 12/17/2016  . Paroxysmal atrial fibrillation ([place]) 12/17/2016  . Pure hypercholesterolemia 12/17/2016  . Rectal cancer ([place]) 07/30/2015   Past Medical History:  Diagnosis Date  . 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 Anxiety   . Arthritis    "hands, fingers, back" (02/27/2014)  . Atrial fibrillation ([place])   . Cat scratch of right hand 02/24/2014  . COPD (chronic obstructive  pulmonary disease) ([place])   . Dyspnea    walking up hill;.  stairs (If Iam tired)  . Dysrhythmia    PAF  . Fall from horse 01/2018  . Fracture, ankle 02/2019   Right   . High cholesterol   . History of stomach ulcers ~ 1966  . Osteopenia     Family History  Problem Relation Age of Onset  . Breast cancer Maternal Aunt   . Breast cancer Maternal Grandmother   . Heart disease Mother   . Stroke Father   . Rectal cancer Paternal Grandmother   . Breast cancer Sister 39  . Breast cancer Sister 41    Past Surgical History:  Procedure Laterality Date  . ANUS SURGERY  2005   "biopsy"  . BREAST CYST EXCISION Right 1966  . DILATION AND CURETTAGE OF UTERUS  1980's   S/P miscarriage  . EXTERNAL FIXATION LEG Right 03/10/2019   Procedure: OPEN REDUCTION INTERNAL FIXATION RIGHT ANKLE;  Surgeon: Marchia Bond, MD;  Location: [facility];  Service: Orthopedics;  Laterality: Right;  . HARDWARE REMOVAL Right 02/05/2021   Procedure: REMOVAL OF HARDWARE RIGHT ANKLE;  Surgeon: Newt Minion, MD;  Location: [facility];  Service: Orthopedics;  Laterality: Right;  Marland Kitchen VARICOSE VEIN SURGERY Left    left leg  . VIDEO BRONCHOSCOPY WITH ENDOBRONCHIAL NAVIGATION Right 08/25/2020   Procedure: VIDEO BRONCHOSCOPY WITH ENDOBRONCHIAL NAVIGATION WITH FIDUCIAL PLACEMENT;  Surgeon: Garner Nash, DO;  Location: [facility];  Service: Pulmonary;  Laterality: Right;   Social History   Occupational History  . Not on file  Tobacco Use  . Smoking status: Former Smoker    Packs/day: 1.00    Years: 30.00    Pack years: 30.00    Types: Cigarettes    Quit date: 2005    Years since quitting: 17.2  . Smokeless tobacco: Never Used  Vaping Use  . Vaping Use: Never used  Substance and Sexual Activity  . Alcohol use: No  . Drug use: No  . Sexual activity: Not Currently    Partners: Male    Birth control/protection: Post-menopausal

## 2021-02-24 ENCOUNTER — Other Ambulatory Visit (HOSPITAL_COMMUNITY): Payer: Self-pay

## 2021-02-24 MED FILL — Atorvastatin Calcium Tab 10 MG (Base Equivalent): ORAL | 80 days supply | Qty: 23 | Fill #0 | Status: AC

## 2021-03-09 ENCOUNTER — Ambulatory Visit (INDEPENDENT_AMBULATORY_CARE_PROVIDER_SITE_OTHER): Payer: Medicare Other | Admitting: Physician Assistant

## 2021-03-09 ENCOUNTER — Encounter: Payer: Self-pay | Admitting: Physician Assistant

## 2021-03-09 DIAGNOSIS — M25571 Pain in right ankle and joints of right foot: Secondary | ICD-10-CM

## 2021-03-09 NOTE — [place] Notes (Signed)
Office Visit Note   Patient: Julie Mann           Date of Birth: [birth year]           MRN: [id number] Visit Date: 03/09/2021              Requested by: Lajean Manes, MD [address] [place],  [place] [address] PCP: Lajean Manes, MD  No chief complaint on file.     HPI: Patient is 1 month status post removal of hardware right ankle.  She continues to improve.  She has completed her antibiotics and reports less pain she still has some numbness across the front of the ankle and foot  Assessment & Plan: Visit Diagnoses: No diagnosis found.  Plan: Continue with compression stockings follow-up for final visit in 1 month  Follow-Up Instructions: No follow-ups on file.   Ortho Exam  Patient is alert, oriented, no adenopathy, well-dressed, normal affect, normal respiratory effort. Well-healed surgical incision there is 1 very small area of superficial pulling a part of the wound but this does not probe deeply and is much better than her last visit.  2 Steri-Strips were reapplied in this area and she was given some and shown how to use them.  No ascending cellulitis no foul odor no drainage no signs of acute infection  Imaging: No results found. No images are attached to the encounter.  Labs: Lab Results  Component Value Date   REPTSTATUS 02/10/2021 FINAL 02/05/2021   GRAMSTAIN  02/05/2021    MODERATE WBC PRESENT,BOTH PMN AND MONONUCLEAR NO ORGANISMS SEEN    CULT  02/05/2021    RARE STAPHYLOCOCCUS CAPITIS RARE STAPHYLOCOCCUS EPIDERMIDIS NO ANAEROBES ISOLATED Performed at [facility], [address], [place], [place] [address]    [place] 02/05/2021   LABORGA STAPHYLOCOCCUS EPIDERMIDIS 02/05/2021     Lab Results  Component Value Date   ALBUMIN 3.6 08/25/2020   ALBUMIN 4.1 03/24/2009   ALBUMIN 3.9 09/24/2008    No results found for: MG No results found for: VD25OH  No results found for: PREALBUMIN CBC EXTENDED  Latest Ref Rng & Units 02/05/2021 08/25/2020 03/10/2019  WBC 4.0 - 10.5 K/uL 7.5 6.6 13.1(H)  RBC 3.87 - 5.11 MIL/uL 5.22(H) 4.76 4.70  HGB 12.0 - 15.0 g/dL 13.9 12.6 12.8  HCT 36.0 - 46.0 % 45.2 40.5 40.5  PLT 150 - 400 K/uL 391 321 287  NEUTROABS 1.7 - 7.7 K/uL - - -  LYMPHSABS 0.7 - 4.0 K/uL - - -     There is no height or weight on file to calculate BMI.  Orders:  No orders of the defined types were placed in this encounter.  No orders of the defined types were placed in this encounter.    Procedures: No procedures performed  Clinical Data: No additional findings.  ROS:  All other systems negative, except as noted in the HPI. Review of Systems  Objective: Vital Signs: LMP 11/14/1992   Specialty Comments:  No specialty comments available.  PMFS History: Patient Active Problem List   Diagnosis Date Noted  . Hardware complicating wound infection ([place])   . Neck pain 10/05/2020  . Nodule of upper lobe of right lung 08/17/2020  . Biceps tendonosis of left shoulder 02/13/2020  . Impingement syndrome of left shoulder 02/13/2020  . Post-operative state 02/13/2020  . Ankle dislocation, right, initial encounter 03/10/2019  . Trimalleolar fracture of ankle, closed, right, initial encounter   . Chronic left  shoulder pain 02/19/2019  . Anxiety 12/17/2016  . 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 Osteopenia 12/17/2016  . Paroxysmal atrial fibrillation ([place]) 12/17/2016  . Pure hypercholesterolemia 12/17/2016  . Rectal cancer ([place]) 07/30/2015   Past Medical History:  Diagnosis Date  . 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 Anxiety   . Arthritis    "hands, fingers, back" (02/27/2014)  . Atrial fibrillation ([place])   . Cat scratch of right hand 02/24/2014  . COPD (chronic obstructive pulmonary disease) ([place])   . Dyspnea    walking up hill;.  stairs (If Iam tired)  . Dysrhythmia    PAF  . Fall from horse 01/2018  . Fracture, ankle  02/2019   Right   . High cholesterol   . History of stomach ulcers ~ 1966  . Osteopenia     Family History  Problem Relation Age of Onset  . Breast cancer Maternal Aunt   . Breast cancer Maternal Grandmother   . Heart disease Mother   . Stroke Father   . Rectal cancer Paternal Grandmother   . Breast cancer Sister 67  . Breast cancer Sister 39    Past Surgical History:  Procedure Laterality Date  . ANUS SURGERY  2005   "biopsy"  . BREAST CYST EXCISION Right 1966  . DILATION AND CURETTAGE OF UTERUS  1980's   S/P miscarriage  . EXTERNAL FIXATION LEG Right 03/10/2019   Procedure: OPEN REDUCTION INTERNAL FIXATION RIGHT ANKLE;  Surgeon: Marchia Bond, MD;  Location: [facility];  Service: Orthopedics;  Laterality: Right;  . HARDWARE REMOVAL Right 02/05/2021   Procedure: REMOVAL OF HARDWARE RIGHT ANKLE;  Surgeon: Newt Minion, MD;  Location: [facility];  Service: Orthopedics;  Laterality: Right;  Marland Kitchen VARICOSE VEIN SURGERY Left    left leg  . VIDEO BRONCHOSCOPY WITH ENDOBRONCHIAL NAVIGATION Right 08/25/2020   Procedure: VIDEO BRONCHOSCOPY WITH ENDOBRONCHIAL NAVIGATION WITH FIDUCIAL PLACEMENT;  Surgeon: Garner Nash, DO;  Location: [facility];  Service: Pulmonary;  Laterality: Right;   Social History   Occupational History  . Not on file  Tobacco Use  . Smoking status: Former Smoker    Packs/day: 1.00    Years: 30.00    Pack years: 30.00    Types: Cigarettes    Quit date: 2005    Years since quitting: 17.3  . Smokeless tobacco: Never Used  Vaping Use  . Vaping Use: Never used  Substance and Sexual Activity  . Alcohol use: No  . Drug use: No  . Sexual activity: Not Currently    Partners: Male    Birth control/protection: Post-menopausal

## 2021-03-10 DIAGNOSIS — Z86010 Personal history of colonic polyps: Secondary | ICD-10-CM | POA: Diagnosis not present

## 2021-03-10 DIAGNOSIS — K219 Gastro-esophageal reflux disease without esophagitis: Secondary | ICD-10-CM | POA: Diagnosis not present

## 2021-03-15 ENCOUNTER — Other Ambulatory Visit (HOSPITAL_COMMUNITY): Payer: Self-pay

## 2021-03-22 DIAGNOSIS — E7800 Pure hypercholesterolemia, unspecified: Secondary | ICD-10-CM | POA: Diagnosis not present

## 2021-03-22 DIAGNOSIS — K219 Gastro-esophageal reflux disease without esophagitis: Secondary | ICD-10-CM | POA: Diagnosis not present

## 2021-03-22 DIAGNOSIS — I480 Paroxysmal atrial fibrillation: Secondary | ICD-10-CM | POA: Diagnosis not present

## 2021-03-22 DIAGNOSIS — J449 Chronic obstructive pulmonary disease, unspecified: Secondary | ICD-10-CM | POA: Diagnosis not present

## 2021-04-06 ENCOUNTER — Ambulatory Visit (INDEPENDENT_AMBULATORY_CARE_PROVIDER_SITE_OTHER): Payer: Medicare Other | Admitting: Physician Assistant

## 2021-04-06 ENCOUNTER — Encounter: Payer: Self-pay | Admitting: Physician Assistant

## 2021-04-06 DIAGNOSIS — M25571 Pain in right ankle and joints of right foot: Secondary | ICD-10-CM

## 2021-04-06 NOTE — Progress Notes (Signed)
Office Visit Note   Patient: Julie Mann           Date of Birth: 18-Jun-1946           MRN: [id number] Visit Date: 04/06/2021              Requested by: Lajean Manes, MD [address] [place],  [place] [address] PCP: Lajean Manes, MD  No chief complaint on file.     HPI: Patient presents in follow-up today she is status post removal of hardware 2 months ago.  She continues to do better.  She says her swelling always but gone away.  She still has some pain over the incision but it is completely healed.  Her biggest complaint is of ankle stiffness she does her own physical therapy day by day  Assessment & Plan: Visit Diagnoses: No diagnosis found.  Plan: She is going to continue to work on her own and advance her activities.  If she has any concerns she will call follow-up in 2 months or sooner  Follow-Up Instructions: No follow-ups on file.   Ortho Exam  Patient is alert, oriented, no adenopathy, well-dressed, normal affect, normal respiratory effort. Ankle well-healed surgical incision.  No surrounding erythema no ascending cellulitis she has 1 very small thin eschar at the distal end of the wound.  Ankle range of motion is somewhat stiff with dorsiflexion she comes only to position  Imaging: No results found. No images are attached to the encounter.  Labs: Lab Results  Component Value Date   REPTSTATUS 02/10/2021 FINAL 02/05/2021   GRAMSTAIN  02/05/2021    MODERATE WBC PRESENT,BOTH PMN AND MONONUCLEAR NO ORGANISMS SEEN    CULT  02/05/2021    RARE STAPHYLOCOCCUS CAPITIS RARE STAPHYLOCOCCUS EPIDERMIDIS NO ANAEROBES ISOLATED Performed at [facility], [address], [place], [place] [address]    [place] 02/05/2021   LABORGA STAPHYLOCOCCUS EPIDERMIDIS 02/05/2021     Lab Results  Component Value Date   ALBUMIN 3.6 08/25/2020   ALBUMIN 4.1 03/24/2009   ALBUMIN 3.9 09/24/2008    No results found for: MG No  results found for: VD25OH  No results found for: PREALBUMIN CBC EXTENDED Latest Ref Rng & Units 02/05/2021 08/25/2020 03/10/2019  WBC 4.0 - 10.5 K/uL 7.5 6.6 13.1(H)  RBC 3.87 - 5.11 MIL/uL 5.22(H) 4.76 4.70  HGB 12.0 - 15.0 g/dL 13.9 12.6 12.8  HCT 36.0 - 46.0 % 45.2 40.5 40.5  PLT 150 - 400 K/uL 391 321 287  NEUTROABS 1.7 - 7.7 K/uL - - -  LYMPHSABS 0.7 - 4.0 K/uL - - -     There is no height or weight on file to calculate BMI.  Orders:  No orders of the defined types were placed in this encounter.  No orders of the defined types were placed in this encounter.    Procedures: No procedures performed  Clinical Data: No additional findings.  ROS:  All other systems negative, except as noted in the HPI. Review of Systems  Objective: Vital Signs: LMP 11/14/1992   Specialty Comments:  No specialty comments available.  PMFS History: Patient Active Problem List   Diagnosis Date Noted  . Hardware complicating wound infection ([place])   . Neck pain 10/05/2020  . Nodule of upper lobe of right lung 08/17/2020  . Biceps tendonosis of left shoulder 02/13/2020  . Impingement syndrome of left shoulder 02/13/2020  . Post-operative state 02/13/2020  . Ankle dislocation, right, initial encounter  03/10/2019  . Trimalleolar fracture of ankle, closed, right, initial encounter   . Chronic left shoulder pain 02/19/2019  . Anxiety 12/17/2016  . 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 Osteopenia 12/17/2016  . Paroxysmal atrial fibrillation ([place]) 12/17/2016  . Pure hypercholesterolemia 12/17/2016  . Rectal cancer ([place]) 07/30/2015   Past Medical History:  Diagnosis Date  . 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 Anxiety   . Arthritis    "hands, fingers, back" (02/27/2014)  . Atrial fibrillation ([place])   . Cat scratch of right hand 02/24/2014  . COPD (chronic obstructive pulmonary disease) ([place])   . Dyspnea    walking up hill;.  stairs (If [place]am  tired)  . Dysrhythmia    PAF  . Fall from horse 01/2018  . Fracture, ankle 02/2019   Right   . High cholesterol   . History of stomach ulcers ~ 1966  . Osteopenia     Family History  Problem Relation Age of Onset  . Breast cancer Maternal Aunt   . Breast cancer Maternal Grandmother   . Heart disease Mother   . Stroke Father   . Rectal cancer Paternal Grandmother   . Breast cancer Sister 105  . Breast cancer Sister 98    Past Surgical History:  Procedure Laterality Date  . ANUS SURGERY  2005   "biopsy"  . BREAST CYST EXCISION Right 1966  . DILATION AND CURETTAGE OF UTERUS  1980's   S/P miscarriage  . EXTERNAL FIXATION LEG Right 03/10/2019   Procedure: OPEN REDUCTION INTERNAL FIXATION RIGHT ANKLE;  Surgeon: Marchia Bond, MD;  Location: [facility];  Service: Orthopedics;  Laterality: Right;  . HARDWARE REMOVAL Right 02/05/2021   Procedure: REMOVAL OF HARDWARE RIGHT ANKLE;  Surgeon: Newt Minion, MD;  Location: [facility];  Service: Orthopedics;  Laterality: Right;  [facility] VARICOSE VEIN SURGERY Left    left leg  . VIDEO BRONCHOSCOPY WITH ENDOBRONCHIAL NAVIGATION Right 08/25/2020   Procedure: VIDEO BRONCHOSCOPY WITH ENDOBRONCHIAL NAVIGATION WITH FIDUCIAL PLACEMENT;  Surgeon: Garner Nash, DO;  Location: [facility];  Service: Pulmonary;  Laterality: Right;   Social History   Occupational History  . Not on file  Tobacco Use  . Smoking status: Former Smoker    Packs/day: 1.00    Years: 30.00    Pack years: 30.00    Types: Cigarettes    Quit date: 2005    Years since quitting: 17.4  . Smokeless tobacco: Never Used  Vaping Use  . Vaping Use: Never used  Substance and Sexual Activity  . Alcohol use: No  . Drug use: No  . Sexual activity: Not Currently    Partners: Male    Birth control/protection: Post-menopausal

## 2021-04-19 ENCOUNTER — Other Ambulatory Visit (HOSPITAL_COMMUNITY): Payer: Self-pay

## 2021-05-07 DIAGNOSIS — H3500 Unspecified background retinopathy: Secondary | ICD-10-CM | POA: Diagnosis not present

## 2021-05-07 DIAGNOSIS — Z961 Presence of intraocular lens: Secondary | ICD-10-CM | POA: Diagnosis not present

## 2021-05-07 DIAGNOSIS — H52203 Unspecified astigmatism, bilateral: Secondary | ICD-10-CM | POA: Diagnosis not present

## 2021-05-10 ENCOUNTER — Other Ambulatory Visit (HOSPITAL_COMMUNITY): Payer: Self-pay

## 2021-05-10 MED ORDER — ATORVASTATIN CALCIUM 10 MG PO TABS
ORAL_TABLET | ORAL | 3 refills | Status: DC
  Filled 2021-05-10 – 2021-08-02 (×2): qty 23, 80d supply, fill #0
  Filled 2021-08-02 – 2021-10-05 (×2): qty 23, 80d supply, fill #1
  Filled 2021-12-31: qty 23, 80d supply, fill #2

## 2021-05-18 DIAGNOSIS — L570 Actinic keratosis: Secondary | ICD-10-CM | POA: Diagnosis not present

## 2021-05-18 DIAGNOSIS — Z85828 Personal history of other malignant neoplasm of skin: Secondary | ICD-10-CM | POA: Diagnosis not present

## 2021-05-18 DIAGNOSIS — X32XXXD Exposure to sunlight, subsequent encounter: Secondary | ICD-10-CM | POA: Diagnosis not present

## 2021-05-18 DIAGNOSIS — Z08 Encounter for follow-up examination after completed treatment for malignant neoplasm: Secondary | ICD-10-CM | POA: Diagnosis not present

## 2021-05-18 DIAGNOSIS — L718 Other rosacea: Secondary | ICD-10-CM | POA: Diagnosis not present

## 2021-05-19 ENCOUNTER — Other Ambulatory Visit (HOSPITAL_COMMUNITY): Payer: Self-pay

## 2021-05-19 DIAGNOSIS — R197 Diarrhea, unspecified: Secondary | ICD-10-CM | POA: Diagnosis not present

## 2021-05-19 DIAGNOSIS — K625 Hemorrhage of anus and rectum: Secondary | ICD-10-CM | POA: Diagnosis not present

## 2021-05-19 MED ORDER — PEG 3350-KCL-NA BICARB-NACL 420 G PO SOLR
ORAL | 0 refills | Status: DC
  Filled 2021-05-19: qty 4000, 1d supply, fill #0

## 2021-05-27 ENCOUNTER — Encounter: Payer: Self-pay | Admitting: Orthopaedic Surgery

## 2021-05-27 ENCOUNTER — Other Ambulatory Visit: Payer: Self-pay

## 2021-05-27 ENCOUNTER — Ambulatory Visit: Payer: Medicare Other | Admitting: Orthopaedic Surgery

## 2021-05-27 VITALS — Ht 67.0 in | Wt 130.0 lb

## 2021-05-27 DIAGNOSIS — M7542 Impingement syndrome of left shoulder: Secondary | ICD-10-CM | POA: Diagnosis not present

## 2021-05-27 DIAGNOSIS — G8929 Other chronic pain: Secondary | ICD-10-CM

## 2021-05-27 DIAGNOSIS — M25512 Pain in left shoulder: Secondary | ICD-10-CM | POA: Diagnosis not present

## 2021-05-27 MED ORDER — LIDOCAINE HCL 1 % IJ SOLN
2.0000 mL | INTRAMUSCULAR | Status: AC | PRN
  Administered 2021-05-27: 2 mL

## 2021-05-27 MED ORDER — BUPIVACAINE HCL 0.25 % IJ SOLN
2.0000 mL | INTRAMUSCULAR | Status: AC | PRN
  Administered 2021-05-27: 2 mL via INTRA_ARTICULAR

## 2021-05-27 NOTE — Progress Notes (Signed)
Office Visit Note   Patient: Julie Mann           Date of Birth: Jan 14, 1946           MRN: [id number] Visit Date: 05/27/2021              Requested by: Lajean Manes, MD [address] [place],  [place] [address] PCP: Lajean Manes, MD   Assessment & Plan: Visit Diagnoses:  1. Chronic left shoulder pain   2. Impingement syndrome of left shoulder     Plan: Recurrent left shoulder pain.  Has had prior arthroscopic SCD DCR and biceps tenodesis.  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  She is done well with intra-articular cortisone in the left shoulder in the past so we will perform that again.  Last injection was September Follow-Up Instructions: Return if symptoms worsen or fail to improve.   Orders:  Orders Placed This Encounter  Procedures   Large Joint Inj: L glenohumeral   No orders of the defined types were placed in this encounter.     Procedures: Large Joint Inj: L glenohumeral on 05/27/2021 1:22 PM Details: 25 G 1.5 in needle, posterior approach  Arthrogram: No  Medications: 2 mL lidocaine 1 %; 2 mL bupivacaine 0.25 %  12 mg betamethasone injected in the glenohumeral joint and subacromial space left shoulder without problem Procedure, treatment alternatives, risks and benefits explained, specific risks discussed. Consent was given by the patient. Immediately prior to procedure a time out was called to verify the correct patient, procedure, equipment, support staff and site/side marked as required. Patient was prepped and draped in the usual sterile fashion.      Clinical Data: No additional findings.   Subjective: Chief Complaint  Patient presents with   Left Shoulder - Follow-up, Pain  Patient presents today for recurrent left shoulder pain. She said that the injections help. She last received one in September. She would like to get another one today.   HPI  Review  of Systems   Objective: Vital Signs: Ht 5\' 7"  (1.702 m)   Wt 130 lb (59 kg)   LMP 11/14/1992   BMI 20.36 kg/m   Physical Exam Constitutional:      Appearance: She is well-developed.  Eyes:     Pupils: Pupils are equal, round, and reactive to light.  Pulmonary:     Effort: Pulmonary effort is normal.  Skin:    General: Skin is warm and dry.  Neurological:     Mental Status: She is alert and oriented to person, place, and time.  Psychiatric:        Behavior: Behavior normal.    Ortho Exam left shoulder lacked a few degrees to full overhead motion and a little bit of external rotation but no crepitation and minimally positive impingement.  Did have some tenderness along the anterior aspect of the shoulder in the subacromial region but not at the [facility] joint and no pain posteriorly.  Specialty Comments:  No specialty comments available.  Imaging: No results found.   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   stairs (If Iam tired)   Dysrhythmia    PAF   Fall from horse 01/2018   Fracture, ankle 02/2019   Right    High cholesterol    History of stomach ulcers ~ 1966   Osteopenia     Family History  Problem Relation Age of Onset   Breast cancer Maternal Aunt    Breast cancer Maternal Grandmother    Heart disease Mother    Stroke Father    Rectal cancer Paternal Grandmother    Breast cancer Sister 41   Breast cancer Sister 1    Past Surgical History:  Procedure Laterality Date   ANUS SURGERY  2005   "biopsy"   BREAST CYST EXCISION Right 1966   [place]  1980's   S/P miscarriage   EXTERNAL FIXATION LEG Right 03/10/2019   Procedure: OPEN REDUCTION INTERNAL FIXATION RIGHT ANKLE;  Surgeon: Marchia Bond, MD;  Location: [facility];  Service: Orthopedics;  Laterality: Right;   HARDWARE REMOVAL Right 02/05/2021   Procedure: REMOVAL OF HARDWARE RIGHT ANKLE;  Surgeon: Newt Minion, MD;  Location: [facility];  Service: Orthopedics;  Laterality: Right;   VARICOSE VEIN SURGERY Left    left leg   VIDEO BRONCHOSCOPY WITH ENDOBRONCHIAL NAVIGATION Right 08/25/2020   Procedure: VIDEO BRONCHOSCOPY WITH ENDOBRONCHIAL NAVIGATION WITH FIDUCIAL PLACEMENT;  Surgeon: Garner Nash, DO;  Location: [facility];  Service: Pulmonary;  Laterality: Right;   Social History   Occupational History   Not on file  Tobacco Use   Smoking status: Former    Packs/day: 1.00    Years: 30.00    Pack years: 30.00    Types: Cigarettes    Quit date: 2005    Years since quitting: 17.5   Smokeless tobacco: Never  Vaping Use   Vaping Use: Never used  Substance and Sexual Activity   Alcohol use: No   Drug use: No   Sexual activity: Not Currently    Partners: Male    Birth control/protection: Post-menopausal

## 2021-06-01 DIAGNOSIS — Z86010 Personal history of colonic polyps: Secondary | ICD-10-CM | POA: Diagnosis not present

## 2021-06-01 DIAGNOSIS — D122 Benign neoplasm of ascending colon: Secondary | ICD-10-CM | POA: Diagnosis not present

## 2021-06-01 DIAGNOSIS — K624 Stenosis of anus and rectum: Secondary | ICD-10-CM | POA: Diagnosis not present

## 2021-06-02 DIAGNOSIS — H35033 Hypertensive retinopathy, bilateral: Secondary | ICD-10-CM | POA: Diagnosis not present

## 2021-06-02 DIAGNOSIS — K219 Gastro-esophageal reflux disease without esophagitis: Secondary | ICD-10-CM | POA: Diagnosis not present

## 2021-06-02 DIAGNOSIS — Z85048 Personal history of other malignant neoplasm of rectum, rectosigmoid junction, and anus: Secondary | ICD-10-CM | POA: Diagnosis not present

## 2021-06-02 DIAGNOSIS — I480 Paroxysmal atrial fibrillation: Secondary | ICD-10-CM | POA: Diagnosis not present

## 2021-06-04 DIAGNOSIS — D122 Benign neoplasm of ascending colon: Secondary | ICD-10-CM | POA: Diagnosis not present

## 2021-06-11 ENCOUNTER — Other Ambulatory Visit (HOSPITAL_COMMUNITY): Payer: Self-pay

## 2021-06-17 ENCOUNTER — Ambulatory Visit: Payer: Medicare Other | Admitting: Orthopedic Surgery

## 2021-07-01 ENCOUNTER — Ambulatory Visit
Admission: RE | Admit: 2021-07-01 | Discharge: 2021-07-01 | Disposition: A | Discharge status: Home / Self Care | Payer: Medicare Other | Source: Ambulatory Visit | Attending: Pulmonary Disease | Admitting: Pulmonary Disease

## 2021-07-01 DIAGNOSIS — R911 Solitary pulmonary nodule: Secondary | ICD-10-CM | POA: Diagnosis not present

## 2021-07-01 DIAGNOSIS — R918 Other nonspecific abnormal finding of lung field: Secondary | ICD-10-CM | POA: Diagnosis not present

## 2021-07-01 DIAGNOSIS — I700 Atherosclerosis of aorta: Secondary | ICD-10-CM | POA: Diagnosis not present

## 2021-07-05 ENCOUNTER — Encounter: Payer: Self-pay | Admitting: Pulmonary Disease

## 2021-07-05 ENCOUNTER — Ambulatory Visit: Payer: Medicare Other | Admitting: Pulmonary Disease

## 2021-07-05 ENCOUNTER — Other Ambulatory Visit: Payer: Self-pay

## 2021-07-05 VITALS — BP 132/84 | HR 84 | Temp 97.6°F | Ht 66.5 in | Wt 132.4 lb

## 2021-07-05 DIAGNOSIS — J449 Chronic obstructive pulmonary disease, unspecified: Secondary | ICD-10-CM

## 2021-07-05 DIAGNOSIS — R911 Solitary pulmonary nodule: Secondary | ICD-10-CM

## 2021-07-05 DIAGNOSIS — Z87891 Personal history of nicotine dependence: Secondary | ICD-10-CM | POA: Diagnosis not present

## 2021-07-05 NOTE — Patient Instructions (Addendum)
Thank you for visiting Dr. Valeta Harms at [facility]. Today we recommend the following:  Orders Placed This Encounter  Procedures   CT Super D Chest Wo Contrast   Return in about 6 months (around 01/05/2022) for w/ Dr. Valeta Harms . After CT chest complete    Please do your part to reduce the spread of COVID-19.

## 2021-07-05 NOTE — Progress Notes (Signed)
Synopsis: Referred in October 2021 for lung nodule by Lajean Manes, MD.  Referral from Dr. Kipp Brood.  Subjective:   PATIENT ID: Julie Mann GENDER: female DOB: 1946/04/12, MRN: [id number]  Chief Complaint  Patient presents with   Follow-up    Pt states she has been doing okay since last visit. States she recently had a Super D CT performed.    This is a 75 year old female, history of anal cancer status post chemo plus radiation, atrial fibrillation on aspirin 81 mg daily.  Patient is a former smoker, 30-pack-year history.  CT imaging of the chest was completed in August 2021 by primary care which revealed a interval increase in a subcentimeter pulmonary nodule now 7 mm in size.  It has slowly been growing since 2019.  Images reviewed from 2019 at 5 mm, August 2020 at 6 mm and now August 2021 nodules at 7 mm.  She does have associated upper lobe predominant emphysema.  She is currently relatively asymptomatic except for dyspnea on exertion.  She did have pulmonary function test completed recently which revealed an FEV1 postbronchodilator of 76% consistent with stage II COPD.  She does notice that her dyspnea is worse with climbing stairs.  She does feel out of breath and has to stop which has changed some and likely gotten worse over the past 6 months.  Rarely has any sputum production.  She does not use any maintenance inhalers.  She does not have an albuterol inhaler.  She did have anal cancer and tolerated the radiation well.  She said she did not do well with the chemo treatments.  She is very anxious about having a large surgery for a lung nodule.  If she could have radiation treatments she would prefer this.  Today I discussed the risk benefits and alternatives of proceeding with navigational bronchoscopy.  OV 12/24/2020: Here today for follow-up after recent CT imaging.  Patient was taken initially after CT imaging revealed a upper lobe spiculated 7 mm nodule concerning for malignancy last  fall for navigational bronchoscopy.  Tissue sampling was negative for malignancy.  Cultures negative.  3 fiducials were placed at the time.  Patient here after follow-up images at approximately 5 months from previous.  Shows persistence of the 7 mm right upper lobe nodule with associated centrilobular emphysema.  Also with the adjacent fiducial markers.  OV 07/05/2021: Here today for follow-up regarding lung nodule.  From respiratory standpoint she is doing okay.  She was giving a maintenance inhaler and did not tolerate its use.  She is currently on no inhalers.  She does have COPD.  She recently sold her house and her and her husband live in assisted living now.  Her recent CT scan was completed this week for follow-up of her lung nodule.  It does appear slightly more solid in nature along its inferior margin.  But overall relatively the same.   Past Medical History:  Diagnosis Date   Anal cancer ([place]) 2005   SCCa anus-stage II chemo, radiation   Anxiety    Arthritis    "hands, fingers, back" (02/27/2014)   Atrial fibrillation ([place])    Cat scratch of right hand 02/24/2014   COPD (chronic obstructive pulmonary disease) ([facility])    Dyspnea    walking up hill;.  stairs (If Iam tired)   Dysrhythmia    PAF   Fall from horse 01/2018   Fracture, ankle 02/2019   Right    High cholesterol    History of  stomach ulcers ~ 1966   Osteopenia      Family History  Problem Relation Age of Onset   Breast cancer Maternal Aunt    Breast cancer Maternal Grandmother    Heart disease Mother    Stroke Father    Rectal cancer Paternal Grandmother    Breast cancer Sister 86   Breast cancer Sister 57     Past Surgical History:  Procedure Laterality Date   ANUS SURGERY  2005   "biopsy"   BREAST CYST EXCISION Right 1966   DILATION AND CURETTAGE OF UTERUS  1980's   S/P miscarriage   EXTERNAL FIXATION LEG Right 03/10/2019   Procedure: OPEN REDUCTION INTERNAL FIXATION RIGHT ANKLE;  Surgeon: Marchia Bond,  MD;  Location: [facility];  Service: Orthopedics;  Laterality: Right;   HARDWARE REMOVAL Right 02/05/2021   Procedure: REMOVAL OF HARDWARE RIGHT ANKLE;  Surgeon: Newt Minion, MD;  Location: [facility];  Service: Orthopedics;  Laterality: Right;   VARICOSE VEIN SURGERY Left    left leg   VIDEO BRONCHOSCOPY WITH ENDOBRONCHIAL NAVIGATION Right 08/25/2020   Procedure: VIDEO BRONCHOSCOPY WITH ENDOBRONCHIAL NAVIGATION WITH FIDUCIAL PLACEMENT;  Surgeon: Garner Nash, DO;  Location: [facility];  Service: Pulmonary;  Laterality: Right;    Social History   Socioeconomic History   Marital status: Married    Spouse name: Not on file   Number of children: Not on file   Years of education: Not on file   Highest education level: Not on file  Occupational History   Not on file  Tobacco Use   Smoking status: Former    Packs/day: 1.00    Years: 30.00    Pack years: 30.00    Types: Cigarettes    Quit date: 2005    Years since quitting: 17.6   Smokeless tobacco: Never  Vaping Use   Vaping Use: Never used  Substance and Sexual Activity   Alcohol use: No   Drug use: No   Sexual activity: Not Currently    Partners: Male    Birth control/protection: Post-menopausal  Other Topics Concern   Not on file  Social History Narrative   Not on file   Social Determinants of Health   Financial Resource Strain: Not on file  Food Insecurity: Not on file  Transportation Needs: Not on file  Physical Activity: Not on file  Stress: Not on file  Social Connections: Not on file  Intimate Partner Violence: Not on file     Allergies  Allergen Reactions   Eliquis [Apixaban] Anaphylaxis    Made her feel sick   Ivp Dye [Iodinated Diagnostic Agents] Swelling   Oxycodone Other (See Comments)    Went into Afib.    Tiotropium Bromide Monohydrate Other (See Comments)    Headache, Blurry vision, chest heaviness.   Conjugated Estrogens     Felt weird Other reaction(s): hallucinations   Doxycycline Other (See  Comments)    Dizziness and tingling   Erythromycin Other (See Comments)    Hallucinations    Flonase [Fluticasone Propionate]    Keflex [Cephalexin] Other (See Comments)    Hallucinations.   Other     Other reaction(s): hallucinations Other reaction(s): sedation Other reaction(s): hallucinations   Paxil [Paroxetine Hcl] Diarrhea   Provera [Medroxyprogesterone Acetate]     Patient do not remember reaction   Provera [Medroxyprogesterone]     Other reaction(s): hallucinations   Xarelto [Rivaroxaban]     Made her feel sick   Zithromax [Azithromycin] Other (See Comments)  hallucinations   Zoloft [Sertraline Hcl] Other (See Comments)    sedation   Zoloft [Sertraline]     Other reaction(s): sedation     Outpatient Medications Prior to Visit  Medication Sig Dispense Refill   APPLE CIDER VINEGAR PO Take 5 mLs by mouth daily as needed (Constipation).     aspirin EC 81 MG tablet Take 1 tablet (81 mg total) by mouth daily. 90 tablet 3   atorvastatin (LIPITOR) 10 MG tablet TAKE 1 TABLET BY MOUTH ON MONDAY AND FRIDAY 80 23 tablet 3   ibuprofen (ADVIL) 600 MG tablet TAKE 1 TABLET (600 MG TOTAL) BY MOUTH DAILY AS NEEDED FOR MODERATE PAIN. 30 tablet 2   LORazepam (ATIVAN) 1 MG tablet Take 1 tablet (1 mg total) by mouth at bedtime as needed for anxiety or sleep. 30 tablet 2   metoprolol tartrate (LOPRESSOR) 25 MG tablet Take 25 mg by mouth daily as needed (palpitations/AFIB).      Multiple Vitamins-Minerals (MULTIVITAMIN PO) Take 1 tablet by mouth daily. One a day     omeprazole (PRILOSEC OTC) 20 MG tablet Take 20 mg by mouth daily as needed (Upset Stomach).     OVER THE COUNTER MEDICATION Take 5 mLs by mouth daily as needed (Constipation). Olive oil     Propylene Glycol-Glycerin 0.6-0.6 % SOLN Place 1 drop into both eyes 2 (two) times daily. Soothe Dry eyes     albuterol (VENTOLIN HFA) 108 (90 Base) MCG/ACT inhaler Inhale 2 puffs into the lungs every 6 (six) hours as needed for wheezing or  shortness of breath. (Patient not taking: Reported on 07/05/2021) 8 g 6   amoxicillin-clavulanate (AUGMENTIN) 875-125 MG tablet TAKE 1 TABLET BY MOUTH 2 (TWO) TIMES DAILY. 30 tablet 0   atorvastatin (LIPITOR) 10 MG tablet Take 10 mg by mouth 2 (two) times a week. Monday and thursday  12   ciprofloxacin (CIPRO) 500 MG tablet TAKE 1 TABLET (500 MG TOTAL) BY MOUTH 2 (TWO) TIMES DAILY. 20 tablet 0   COVID-19 mRNA vaccine, Moderna, 100 MCG/0.5ML injection USE AS DIRECTED .25 mL 0   HYDROcodone-acetaminophen (NORCO/VICODIN) 5-325 MG tablet TAKE 1 TABLET BY MOUTH EVERY 4 (FOUR) HOURS AS NEEDED. 30 tablet 0   polyethylene glycol-electrolytes (NULYTELY) 420 g solution Use as directed 4000 mL 0   No facility-administered medications prior to visit.    Review of Systems  Constitutional:  Negative for chills, fever, malaise/fatigue and weight loss.  HENT:  Negative for hearing loss, sore throat and tinnitus.   Eyes:  Negative for blurred vision and double vision.  Respiratory:  Negative for cough, hemoptysis, sputum production, shortness of breath, wheezing and stridor.   Cardiovascular:  Negative for chest pain, palpitations, orthopnea, leg swelling and PND.  Gastrointestinal:  Negative for abdominal pain, constipation, diarrhea, heartburn, nausea and vomiting.  Genitourinary:  Negative for dysuria, hematuria and urgency.  Musculoskeletal:  Negative for joint pain and myalgias.  Skin:  Negative for itching and rash.  Neurological:  Negative for dizziness, tingling, weakness and headaches.  Endo/Heme/Allergies:  Negative for environmental allergies. Does not bruise/bleed easily.  Psychiatric/Behavioral:  Negative for depression. The patient is not nervous/anxious and does not have insomnia.   All other systems reviewed and are negative.   Objective:  Physical Exam Vitals reviewed.  Constitutional:      General: She is not in acute distress.    Appearance: She is well-developed.  HENT:     Head:  Normocephalic and atraumatic.  Eyes:  General: No scleral icterus.    Conjunctiva/sclera: Conjunctivae normal.     Pupils: Pupils are equal, round, and reactive to light.  Neck:     Vascular: No JVD.     Trachea: No tracheal deviation.  Cardiovascular:     Rate and Rhythm: Normal rate and regular rhythm.     Heart sounds: Normal heart sounds. No murmur heard. Pulmonary:     Effort: Pulmonary effort is normal. No tachypnea, accessory muscle usage or respiratory distress.     Breath sounds: No stridor. No wheezing, rhonchi or rales.     Comments: Diminished breath sounds bilaterally Abdominal:     General: Bowel sounds are normal. There is no distension.     Palpations: Abdomen is soft.     Tenderness: There is no abdominal tenderness.  Musculoskeletal:        General: No tenderness.     Cervical back: Neck supple.  Lymphadenopathy:     Cervical: No cervical adenopathy.  Skin:    General: Skin is warm and dry.     Capillary Refill: Capillary refill takes less than 2 seconds.     Findings: No rash.  Neurological:     Mental Status: She is alert and oriented to person, place, and time.  Psychiatric:        Behavior: Behavior normal.     Vitals:   07/05/21 0921  BP: 132/84  Pulse: 84  Temp: 97.6 F (36.4 C)  TempSrc: Oral  SpO2: 99%  Weight: 132 lb 6.4 oz (60.1 kg)  Height: 5' 6.5" (1.689 m)   99% on RA BMI Readings from Last 3 Encounters:  07/05/21 21.05 kg/m  05/27/21 20.36 kg/m  02/05/21 20.36 kg/m   Wt Readings from Last 3 Encounters:  07/05/21 132 lb 6.4 oz (60.1 kg)  05/27/21 130 lb (59 kg)  02/05/21 130 lb (59 kg)     CBC    Component Value Date/Time   WBC 7.5 02/05/2021 0701   RBC 5.22 (H) 02/05/2021 0701   HGB 13.9 02/05/2021 0701   HGB 13.0 03/24/2009 0913   HCT 45.2 02/05/2021 0701   HCT 38.8 03/24/2009 0913   PLT 391 02/05/2021 0701   PLT 317 03/24/2009 0913   MCV 86.6 02/05/2021 0701   MCV 82.4 03/24/2009 0913   MCH 26.6 02/05/2021  0701   MCHC 30.8 02/05/2021 0701   RDW 13.9 02/05/2021 0701   RDW 13.8 03/24/2009 0913   LYMPHSABS 1.0 02/05/2018 1748   LYMPHSABS 1.0 03/24/2009 0913   MONOABS 0.7 02/05/2018 1748   MONOABS 0.3 03/24/2009 0913   EOSABS 0.1 02/05/2018 1748   EOSABS 0.1 03/24/2009 0913   BASOSABS 0.0 02/05/2018 1748   BASOSABS 0.0 03/24/2009 0913    Chest Imaging: CT chest August 2021: Enlarging right upper lobe pulmonary nodule 7 mm in size with associated centrilobular emphysema.  Concerning for a low-grade malignancy. The patient's images have been independently reviewed by me.    CT chest January 2022: Patient has stable 7 mm spiculated upper lobe nodule with adjacent fiducial markers. The patient's images have been independently reviewed by me.    CT chest August 2022: Stable right upper lobe nodule, fiducial markers adjacent, may be more solid on the inferior component of the nodule.  Close attention to detail for follow-up. The patient's images have been independently reviewed by me.    Pulmonary Functions Testing Results: PFT Results Latest Ref Rng & Units 07/28/2020  FVC-Pre L 2.72  FVC-Predicted Pre % 86  FVC-Post L 2.93  FVC-Predicted Post % 93  Pre FEV1/FVC % % 58  Post FEV1/FCV % % 62  FEV1-Pre L 1.58  FEV1-Predicted Pre % 66  FEV1-Post L 1.81  DLCO uncorrected ml/min/mmHg 15.41  DLCO UNC% % 73  DLCO corrected ml/min/mmHg 15.41  DLCO COR %Predicted % 73  DLVA Predicted % 78  TLC L 6.35  TLC % Predicted % 115  RV % Predicted % 139    FeNO:   Pathology:   Echocardiogram:   Heart Catheterization:     Assessment & Plan:     ICD-10-CM   1. Lung nodule  R91.1 CT Super D Chest Wo Contrast    2. Stage 2 moderate COPD by GOLD classification ([place])  J44.9     3. Former smoker  Z87.891        Assessment:   This is a 75 year old female, right upper lobe pulmonary nodule, stage II COPD, bilateral emphysema.  We have been following this upper lobe nodule  Plan: We  discussed various options including follow-up imaging repeat biopsy. I think next best step which be continue surveillance imaging. We will repeat noncontrasted CT chest in 6 months. Patient would prefer not to take any inhalers at this time.    Current Outpatient Medications:    APPLE CIDER VINEGAR PO, Take 5 mLs by mouth daily as needed (Constipation)., Disp: , Rfl:    aspirin EC 81 MG tablet, Take 1 tablet (81 mg total) by mouth daily., Disp: 90 tablet, Rfl: 3   atorvastatin (LIPITOR) 10 MG tablet, TAKE 1 TABLET BY MOUTH ON MONDAY AND FRIDAY 80, Disp: 23 tablet, Rfl: 3   ibuprofen (ADVIL) 600 MG tablet, TAKE 1 TABLET (600 MG TOTAL) BY MOUTH DAILY AS NEEDED FOR MODERATE PAIN., Disp: 30 tablet, Rfl: 2   LORazepam (ATIVAN) 1 MG tablet, Take 1 tablet (1 mg total) by mouth at bedtime as needed for anxiety or sleep., Disp: 30 tablet, Rfl: 2   metoprolol tartrate (LOPRESSOR) 25 MG tablet, Take 25 mg by mouth daily as needed (palpitations/AFIB). , Disp: , Rfl:    Multiple Vitamins-Minerals (MULTIVITAMIN PO), Take 1 tablet by mouth daily. One a day, Disp: , Rfl:    omeprazole (PRILOSEC OTC) 20 MG tablet, Take 20 mg by mouth daily as needed (Upset Stomach)., Disp: , Rfl:    OVER THE COUNTER MEDICATION, Take 5 mLs by mouth daily as needed (Constipation). Olive oil, Disp: , Rfl:    Propylene Glycol-Glycerin 0.6-0.6 % SOLN, Place 1 drop into both eyes 2 (two) times daily. Soothe Dry eyes, Disp: , Rfl:    albuterol (VENTOLIN HFA) 108 (90 Base) MCG/ACT inhaler, Inhale 2 puffs into the lungs every 6 (six) hours as needed for wheezing or shortness of breath. (Patient not taking: Reported on 07/05/2021), Disp: 8 g, Rfl: 6    Garner Nash, DO [facility] 07/05/2021 9:44 AM

## 2021-07-09 DIAGNOSIS — R112 Nausea with vomiting, unspecified: Secondary | ICD-10-CM | POA: Diagnosis not present

## 2021-07-12 DIAGNOSIS — R197 Diarrhea, unspecified: Secondary | ICD-10-CM | POA: Diagnosis not present

## 2021-07-27 ENCOUNTER — Other Ambulatory Visit: Payer: Self-pay

## 2021-07-27 ENCOUNTER — Encounter (INDEPENDENT_AMBULATORY_CARE_PROVIDER_SITE_OTHER): Payer: Self-pay | Admitting: Ophthalmology

## 2021-07-27 ENCOUNTER — Ambulatory Visit (INDEPENDENT_AMBULATORY_CARE_PROVIDER_SITE_OTHER): Payer: Medicare Other | Admitting: Ophthalmology

## 2021-07-27 DIAGNOSIS — H43821 Vitreomacular adhesion, right eye: Secondary | ICD-10-CM | POA: Diagnosis not present

## 2021-07-27 DIAGNOSIS — H43819 Vitreous degeneration, unspecified eye: Secondary | ICD-10-CM | POA: Insufficient documentation

## 2021-07-27 DIAGNOSIS — Z961 Presence of intraocular lens: Secondary | ICD-10-CM | POA: Insufficient documentation

## 2021-07-27 DIAGNOSIS — H35371 Puckering of macula, right eye: Secondary | ICD-10-CM

## 2021-07-27 DIAGNOSIS — H43813 Vitreous degeneration, bilateral: Secondary | ICD-10-CM | POA: Insufficient documentation

## 2021-07-27 NOTE — Progress Notes (Signed)
07/27/2021     CHIEF COMPLAINT Patient presents for  Chief Complaint  Patient presents with   Retina Follow Up      HISTORY OF PRESENT ILLNESS: Julie Mann is a 75 y.o. female who presents to the clinic today for:   HPI   2 yrs fu ou oct. Pt states "I had my checkup with Dr. Ellie Lunch and she stated I had high blood pressure. She sent it to my regular Dr. I had to take my BP every day for two weeks and it was fine." Patient states vision is stable and unchanged since last visit. Denies any new floaters or FOL. BP: yesterday 124/80. Pt states her guess for her BP range from 70-128. Last edited by Laurin Coder on 07/27/2021  2:36 PM.      Referring physician: Luberta Mutter, MD [place],  [place] [id number]  HISTORICAL INFORMATION:   Selected notes from the MEDICAL RECORD NUMBER       CURRENT MEDICATIONS: Current Outpatient Medications (Ophthalmic Drugs)  Medication Sig   Propylene Glycol-Glycerin 0.6-0.6 % SOLN Place 1 drop into both eyes 2 (two) times daily. Soothe Dry eyes   No current facility-administered medications for this visit. (Ophthalmic Drugs)   Current Outpatient Medications (Other)  Medication Sig   albuterol (VENTOLIN HFA) 108 (90 Base) MCG/ACT inhaler Inhale 2 puffs into the lungs every 6 (six) hours as needed for wheezing or shortness of breath. (Patient not taking: Reported on 07/05/2021)   APPLE CIDER VINEGAR PO Take 5 mLs by mouth daily as needed (Constipation).   aspirin EC 81 MG tablet Take 1 tablet (81 mg total) by mouth daily.   atorvastatin (LIPITOR) 10 MG tablet TAKE 1 TABLET BY MOUTH ON MONDAY AND FRIDAY 80   ibuprofen (ADVIL) 600 MG tablet TAKE 1 TABLET (600 MG TOTAL) BY MOUTH DAILY AS NEEDED FOR MODERATE PAIN.   LORazepam (ATIVAN) 1 MG tablet Take 1 tablet (1 mg total) by mouth at bedtime as needed for anxiety or sleep.   metoprolol tartrate (LOPRESSOR) 25 MG tablet Take 25 mg by mouth daily as needed (palpitations/AFIB).     Multiple Vitamins-Minerals (MULTIVITAMIN PO) Take 1 tablet by mouth daily. One a day   omeprazole (PRILOSEC OTC) 20 MG tablet Take 20 mg by mouth daily as needed (Upset Stomach).   OVER THE COUNTER MEDICATION Take 5 mLs by mouth daily as needed (Constipation). Olive oil   No current facility-administered medications for this visit. (Other)      REVIEW OF SYSTEMS:    ALLERGIES Allergies  Allergen Reactions   Eliquis [Apixaban] Anaphylaxis    Made her feel sick   Ivp Dye [Iodinated Diagnostic Agents] Swelling   Oxycodone Other (See Comments)    Went into Afib.    Tiotropium Bromide Monohydrate Other (See Comments)    Headache, Blurry vision, chest heaviness.   Conjugated Estrogens     Felt weird Other reaction(s): hallucinations   Doxycycline Other (See Comments)    Dizziness and tingling   Erythromycin Other (See Comments)    Hallucinations    Flonase [Fluticasone Propionate]    Keflex [Cephalexin] Other (See Comments)    Hallucinations.   Other     Other reaction(s): hallucinations Other reaction(s): sedation Other reaction(s): hallucinations   Paxil [Paroxetine Hcl] Diarrhea   Provera [Medroxyprogesterone Acetate]     Patient do not remember reaction   Provera [Medroxyprogesterone]     Other reaction(s): hallucinations   Xarelto [Rivaroxaban]  Made her feel sick   Zithromax [Azithromycin] Other (See Comments)    hallucinations   Zoloft [Sertraline Hcl] Other (See Comments)    sedation   Zoloft [Sertraline]     Other reaction(s): sedation    PAST MEDICAL HISTORY Past Medical History:  Diagnosis Date   Anal cancer ([place] [facility]) 2005   SCCa anus-stage II chemo, radiation   Anxiety    Arthritis    "hands, fingers, back" (02/27/2014)   Atrial fibrillation ([place])    Cat scratch of right hand 02/24/2014   COPD (chronic obstructive pulmonary disease) ([place])    Dyspnea    walking up hill;.  stairs (If Iam tired)   Dysrhythmia    PAF   Fall from horse 01/2018    Fracture, ankle 02/2019   Right    High cholesterol    History of stomach ulcers ~ 1966   Osteopenia    Past Surgical History:  Procedure Laterality Date   ANUS SURGERY  2005   "biopsy"   BREAST CYST EXCISION Right 1966   DILATION AND CURETTAGE OF UTERUS  1980's   S/P miscarriage   EXTERNAL FIXATION LEG Right 03/10/2019   Procedure: OPEN REDUCTION INTERNAL FIXATION RIGHT ANKLE;  Surgeon: Marchia Bond, MD;  Location: [facility];  Service: Orthopedics;  Laterality: Right;   HARDWARE REMOVAL Right 02/05/2021   Procedure: REMOVAL OF HARDWARE RIGHT ANKLE;  Surgeon: Newt Minion, MD;  Location: [facility];  Service: Orthopedics;  Laterality: Right;   VARICOSE VEIN SURGERY Left    left leg   VIDEO BRONCHOSCOPY WITH ENDOBRONCHIAL NAVIGATION Right 08/25/2020   Procedure: VIDEO BRONCHOSCOPY WITH ENDOBRONCHIAL NAVIGATION WITH FIDUCIAL PLACEMENT;  Surgeon: Garner Nash, DO;  Location: [facility];  Service: Pulmonary;  Laterality: Right;    FAMILY HISTORY Family History  Problem Relation Age of Onset   Breast cancer Maternal Aunt    Breast cancer Maternal Grandmother    Heart disease Mother    Stroke Father    Rectal cancer Paternal Grandmother    Breast cancer Sister 66   Breast cancer Sister 73    SOCIAL HISTORY Social History   Tobacco Use   Smoking status: Former    Packs/day: 1.00    Years: 30.00    Pack years: 30.00    Types: Cigarettes    Quit date: 2005    Years since quitting: 17.7   Smokeless tobacco: Never  Vaping Use   Vaping Use: Never used  Substance Use Topics   Alcohol use: No   Drug use: No         OPHTHALMIC EXAM:  Base Eye Exam     Visual Acuity (ETDRS)       Right Left   Dist cc 20/20 20/25 +1    Correction: Glasses         Tonometry (Tonopen, 2:41 PM)       Right Left   Pressure 7 11         Pupils       Pupils APD   Right PERRL None   Left PERRL None         Visual Fields (Counting fingers)       Left Right    Full Full          Extraocular Movement       Right Left    Full Full         Neuro/Psych     Oriented x3: Yes   Mood/Affect: Normal  Dilation     Both eyes: 1.0% Mydriacyl, 2.5% Phenylephrine @ 2:41 PM           Slit Lamp and Fundus Exam     External Exam       Right Left   External Normal Normal         Slit Lamp Exam       Right Left   Lids/Lashes Normal Normal   Conjunctiva/Sclera White and quiet White and quiet   Cornea Clear Clear   Anterior Chamber Deep and quiet Deep and quiet   Iris Round and reactive Round and reactive   Lens PC IOL PC IOL   Vitreous Vitreous clear and quiet, posterior vitreous detachment, PVD no vitreous present, vitrectomized, , clear          Fundus Exam       Right Left   Disc Normal Normal   C/D Ratio 0.1 0.1   Macula Normal post ilm removal changes,   Vessels Normal Normal   Periphery Normal Normal            IMAGING AND PROCEDURES  Imaging and Procedures for 07/27/21  OCT, Retina - OU - Both Eyes       Right Eye Quality was good. Scan locations included subfoveal. Central Foveal Thickness: 288. Findings include normal foveal contour, epiretinal membrane.   Left Eye Quality was good. Scan locations included subfoveal. Central Foveal Thickness: 317. Findings include normal foveal contour.   Notes Minor epiretinal membrane temporal portion of the macula OD and no foveal involvement will observe Minor elements of vitreal retinal attachment but no tractional changes noted OD  OS normal foveal and avitric             ASSESSMENT/PLAN:  Macular pucker, right eye Minor not involving the fovea on the temporal aspect of the macula OD observe  Vitreomacular adhesion of right eye OD, minor no impact on acuity     ICD-10-CM   1. Vitreomacular adhesion of right eye  H43.821 OCT, Retina - OU - Both Eyes    2. Posterior vitreous detachment, unspecified laterality  H43.819     3. Pseudophakia of right eye   Z96.1     4. Pseudophakia of left eye  Z96.1     5. Macular pucker, right eye  H35.371       1.  Minor epiretinal membrane OD no impact on acuity  2.  3.  Ophthalmic Meds Ordered this visit:  No orders of the defined types were placed in this encounter.      Return in about 1 year (around 07/27/2022) for DILATE OU, OCT.  There are no Patient Instructions on file for this visit.   Explained the diagnoses, plan, and follow up with the patient and they expressed understanding.  Patient expressed understanding of the importance of proper follow up care.   Clent Demark Dayne Dekay M.D. Diseases & Surgery of the Retina and Vitreous [facility]ina [facility] Diabetic [place] 07/27/21     Abbreviations: M myopia (nearsighted); A astigmatism; H hyperopia (farsighted); P presbyopia; Mrx spectacle prescription;  CTL contact lenses; OD right eye; OS left eye; OU both eyes  XT exotropia; ET esotropia; PEK punctate epithelial keratitis; PEE punctate epithelial erosions; DES dry eye syndrome; MGD meibomian gland dysfunction; ATs artificial tears; PFAT's preservative free artificial tears; [place] nuclear sclerotic cataract; PSC posterior subcapsular cataract; ERM epi-retinal membrane; PVD posterior vitreous detachment; RD retinal detachment; DM diabetes mellitus; DR diabetic retinopathy; NPDR non-proliferative diabetic retinopathy; PDR  proliferative diabetic retinopathy; CSME clinically significant macular edema; DME diabetic macular edema; dbh dot blot hemorrhages; CWS cotton wool spot; POAG primary open angle glaucoma; C/D cup-to-disc ratio; HVF humphrey visual field; GVF goldmann visual field; OCT optical coherence tomography; IOP intraocular pressure; BRVO Branch retinal vein occlusion; CRVO central retinal vein occlusion; CRAO central retinal artery occlusion; BRAO branch retinal artery occlusion; RT retinal tear; SB scleral buckle; PPV pars plana vitrectomy; VH Vitreous hemorrhage; PRP panretinal laser  photocoagulation; IVK intravitreal kenalog; VMT vitreomacular traction; MH Macular hole;  NVD neovascularization of the disc; NVE neovascularization elsewhere; AREDS age related eye disease study; ARMD age related macular degeneration; POAG primary open angle glaucoma; EBMD epithelial/anterior basement membrane dystrophy; ACIOL anterior chamber intraocular lens; IOL intraocular lens; PCIOL posterior chamber intraocular lens; Phaco/IOL phacoemulsification with intraocular lens placement; [place] photorefractive keratectomy; LASIK laser assisted in situ keratomileusis; HTN hypertension; DM diabetes mellitus; COPD chronic obstructive pulmonary disease

## 2021-07-27 NOTE — Assessment & Plan Note (Signed)
OD, minor no impact on acuity

## 2021-07-27 NOTE — Assessment & Plan Note (Signed)
Minor not involving the fovea on the temporal aspect of the macula OD observe

## 2021-08-02 ENCOUNTER — Other Ambulatory Visit (HOSPITAL_COMMUNITY): Payer: Self-pay

## 2021-08-03 DIAGNOSIS — K5900 Constipation, unspecified: Secondary | ICD-10-CM | POA: Diagnosis not present

## 2021-08-03 DIAGNOSIS — K219 Gastro-esophageal reflux disease without esophagitis: Secondary | ICD-10-CM | POA: Diagnosis not present

## 2021-08-05 DIAGNOSIS — H35033 Hypertensive retinopathy, bilateral: Secondary | ICD-10-CM | POA: Diagnosis not present

## 2021-08-05 DIAGNOSIS — K219 Gastro-esophageal reflux disease without esophagitis: Secondary | ICD-10-CM | POA: Diagnosis not present

## 2021-08-05 DIAGNOSIS — E7800 Pure hypercholesterolemia, unspecified: Secondary | ICD-10-CM | POA: Diagnosis not present

## 2021-08-05 DIAGNOSIS — J449 Chronic obstructive pulmonary disease, unspecified: Secondary | ICD-10-CM | POA: Diagnosis not present

## 2021-08-05 DIAGNOSIS — I480 Paroxysmal atrial fibrillation: Secondary | ICD-10-CM | POA: Diagnosis not present

## 2021-08-16 ENCOUNTER — Other Ambulatory Visit (HOSPITAL_COMMUNITY): Payer: Self-pay

## 2021-08-16 ENCOUNTER — Other Ambulatory Visit: Payer: Self-pay | Admitting: Obstetrics & Gynecology

## 2021-08-16 DIAGNOSIS — F419 Anxiety disorder, unspecified: Secondary | ICD-10-CM

## 2021-08-17 ENCOUNTER — Other Ambulatory Visit: Payer: Self-pay | Admitting: Obstetrics & Gynecology

## 2021-08-17 ENCOUNTER — Other Ambulatory Visit (HOSPITAL_COMMUNITY): Payer: Self-pay

## 2021-08-17 DIAGNOSIS — F419 Anxiety disorder, unspecified: Secondary | ICD-10-CM

## 2021-08-17 MED ORDER — LORAZEPAM 1 MG PO TABS
1.0000 mg | ORAL_TABLET | Freq: Every evening | ORAL | 1 refills | Status: DC | PRN
  Filled 2021-08-17: qty 30, 30d supply, fill #0
  Filled 2021-10-05: qty 30, 30d supply, fill #1

## 2021-08-18 ENCOUNTER — Other Ambulatory Visit (HOSPITAL_COMMUNITY): Payer: Self-pay

## 2021-09-06 MED FILL — Ibuprofen Tab 600 MG: ORAL | Qty: 30 | Fill #0 | Status: CN

## 2021-09-08 ENCOUNTER — Other Ambulatory Visit (HOSPITAL_COMMUNITY): Payer: Self-pay

## 2021-09-08 MED FILL — Ibuprofen Tab 600 MG: ORAL | 30 days supply | Qty: 30 | Fill #0 | Status: AC

## 2021-09-13 ENCOUNTER — Other Ambulatory Visit (HOSPITAL_COMMUNITY): Payer: Self-pay

## 2021-09-13 MED ORDER — INFLUENZA VAC A&B SA ADJ QUAD 0.5 ML IM PRSY
0.5000 mL | PREFILLED_SYRINGE | INTRAMUSCULAR | 0 refills | Status: DC
  Filled 2021-09-13: qty 0.5, 1d supply, fill #0

## 2021-09-14 DIAGNOSIS — K582 Mixed irritable bowel syndrome: Secondary | ICD-10-CM | POA: Diagnosis not present

## 2021-09-16 ENCOUNTER — Other Ambulatory Visit: Payer: Self-pay | Admitting: Family Medicine

## 2021-09-16 DIAGNOSIS — Z1231 Encounter for screening mammogram for malignant neoplasm of breast: Secondary | ICD-10-CM

## 2021-10-05 ENCOUNTER — Other Ambulatory Visit (HOSPITAL_COMMUNITY): Payer: Self-pay

## 2021-10-09 IMAGING — CT CT CHEST SUPER D W/O CM
2 of 5 series · 15 of 36 positions shown, 18 images · non-contrast
Comparison: 07/01/2020.  PET-CT 07/29/2020

CLINICAL DATA: Right upper lobe nodule.

EXAM:
CT CHEST WITHOUT CONTRAST
TECHNIQUE: Multidetector CT imaging of the chest was performed using thin slice
collimation for electromagnetic bronchoscopy planning purposes,
without intravenous contrast.

[Series 4: thins · axial · 0.68mm/px · z∈[-348,-45]mm · 12 of 439 slices shown, 15 images]
[im 30/439  mediastinal]
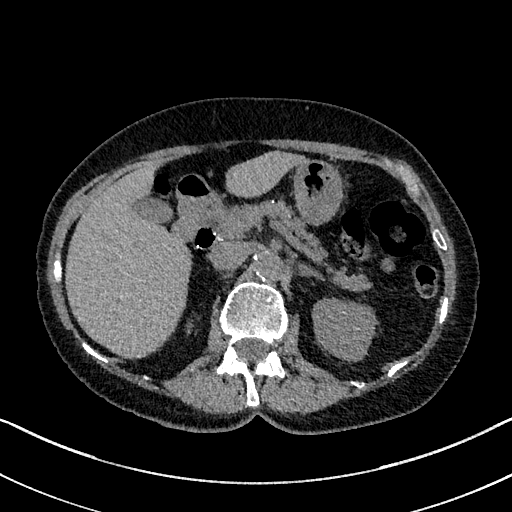
[im 30/439  lung]
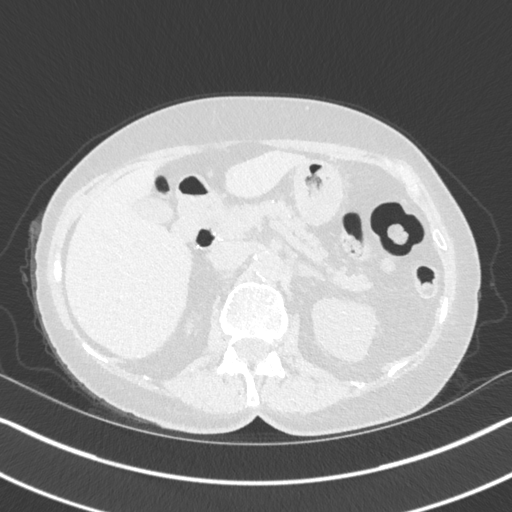
[im 59/439  lung]
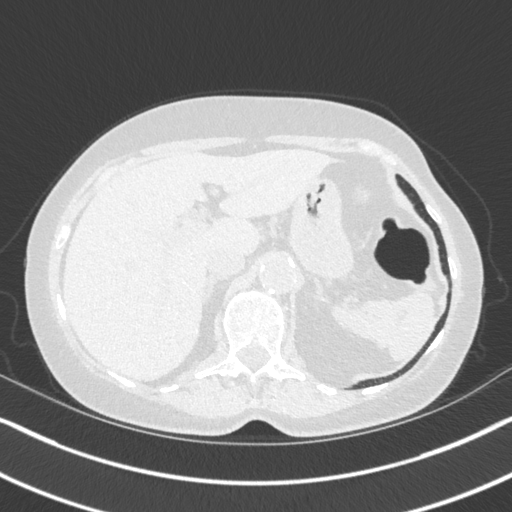
[im 88/439  lung]
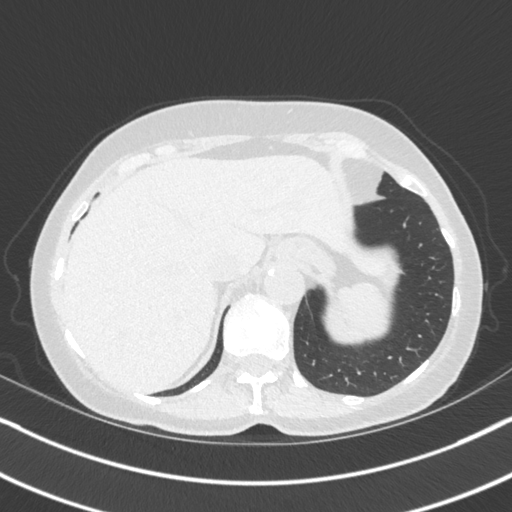
[im 147/439  lung]
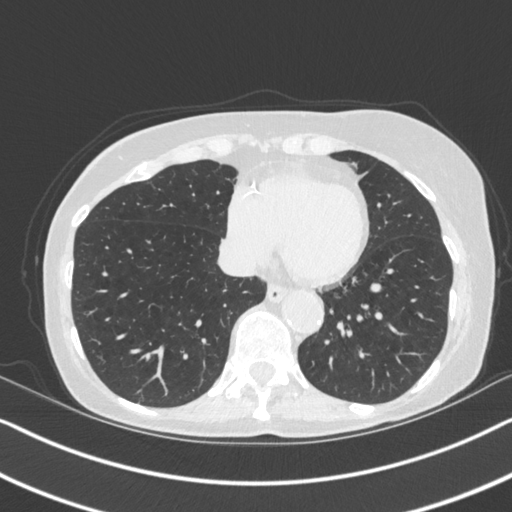
[im 176/439  mediastinal]
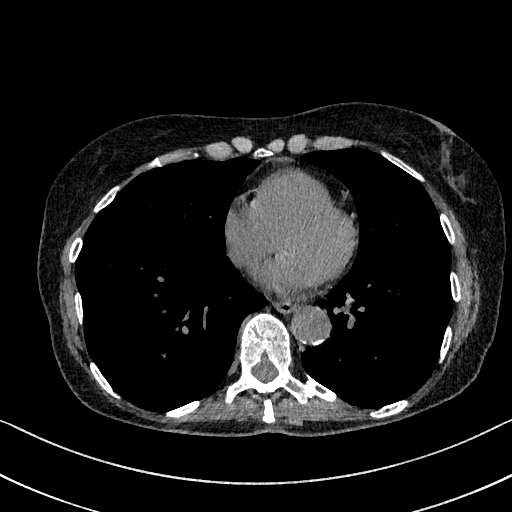
[im 176/439  lung]
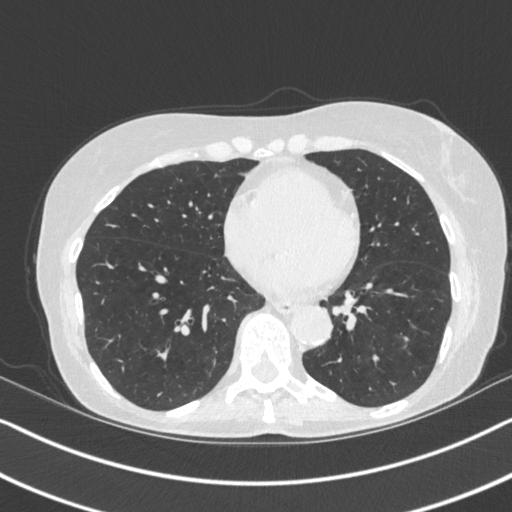
[im 205/439  lung]
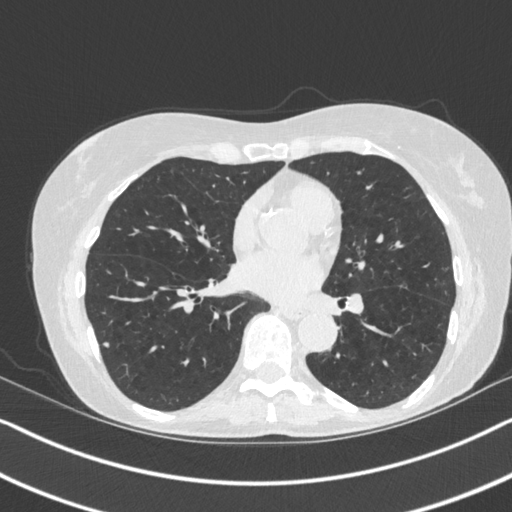
[im 234/439  lung]
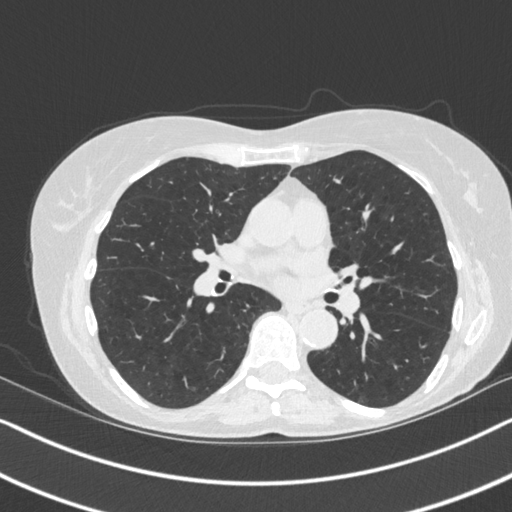
[im 263/439  lung]
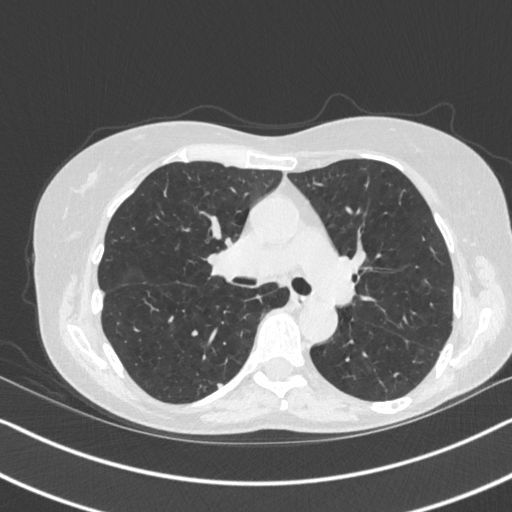
[im 293/439  mediastinal]
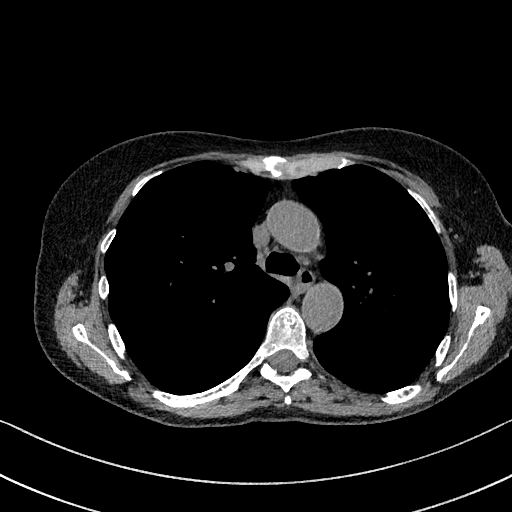
[im 293/439  lung]
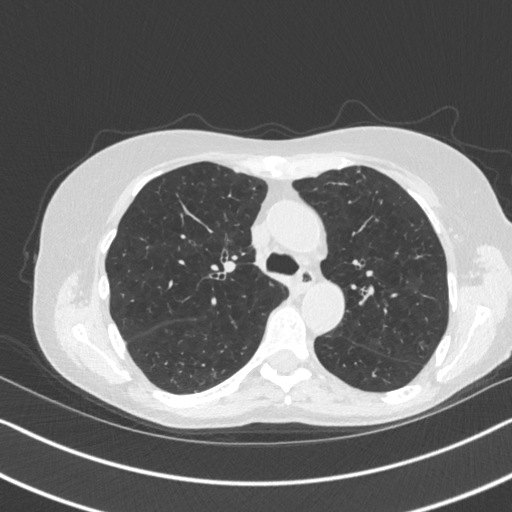
[im 351/439  lung]
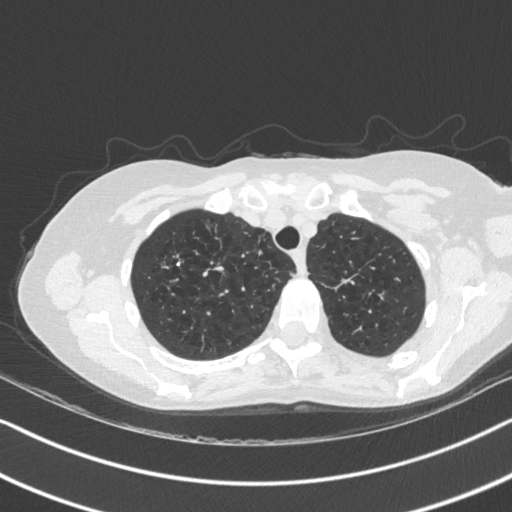
[im 380/439  lung]
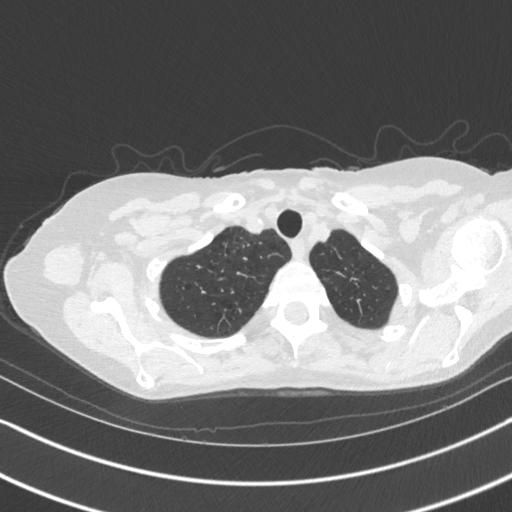
[im 409/439  lung]
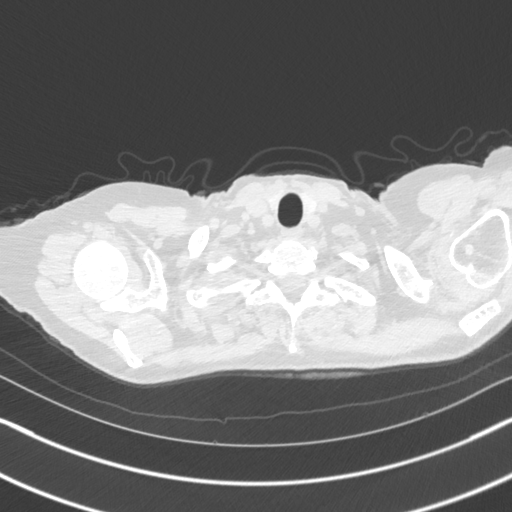

[Series 5: coronal · coronal · 0.61mm/px · 3 of 70 slices shown]
[im 14/70  lung]
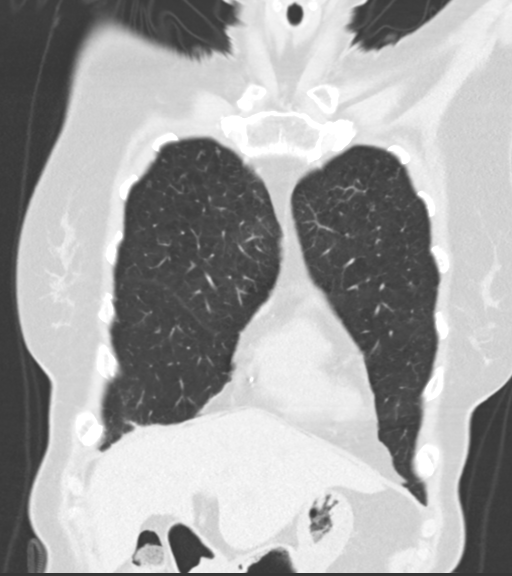
[im 28/70  lung]
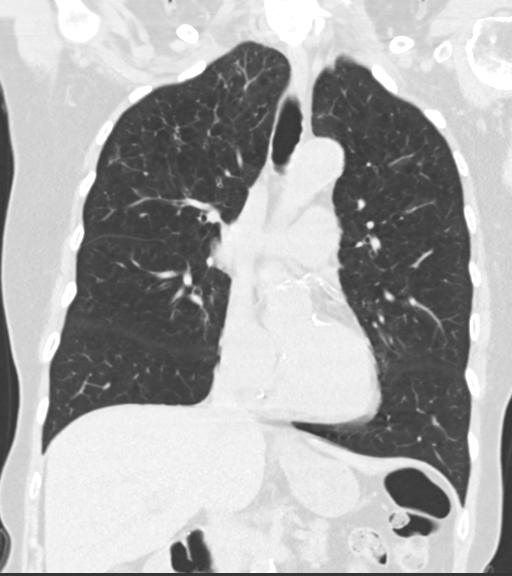
[im 42/70  lung]
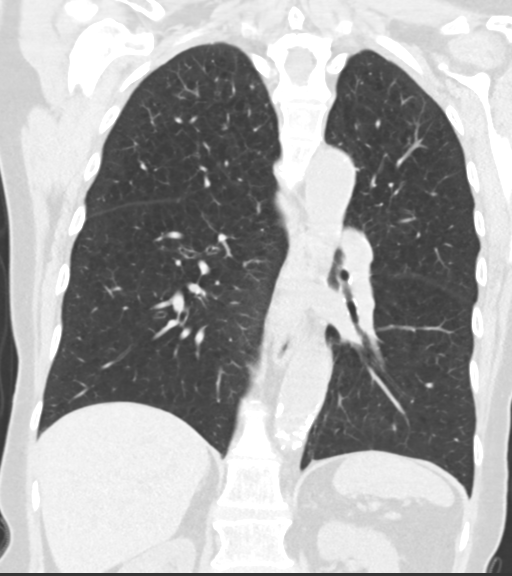

[15 of 36 positions shown; findings below may reference images not displayed]

FINDINGS: Cardiovascular: The heart size is normal. No substantial pericardial
effusion. Coronary artery calcification is evident. Atherosclerotic
calcification is noted in the wall of the thoracic aorta.

Mediastinum/Nodes: No mediastinal lymphadenopathy. No evidence for
gross hilar lymphadenopathy although assessment is limited by the
lack of intravenous contrast on today's study. The esophagus has
normal imaging features. There is no axillary lymphadenopathy.

Lungs/Pleura: Centrilobular emphsyema noted. 7 mm spiculated nodular
density in the right upper lobe measures 7-8 mm today compared to 7
mm previously consistent with no substantial interval change.
Adjacent fiducial markers evident. Tiny adjacent nodules in the
right lower lobe (images 92 and 95 of series 3) are stable since
06/19/2019. Several additional scattered tiny nodules are stable. No
new suspicious pulmonary nodule or mass. No focal airspace
consolidation. No pleural effusion. 6 mm round smoothly marginated
anterior left lower lobe nodule on [DATE] [DATE] is unchanged since
06/19/2019.

Upper Abdomen: Calcified granuloma noted left liver. Large duodenal
diverticulum. Nodular configuration of the pancreatic tail is stable
since 02/05/2018.

Musculoskeletal: No worrisome lytic or sclerotic osseous
abnormality.
IMPRESSION: 1. No substantial interval change in the 7 mm spiculated nodular
density in the right upper lobe with adjacent fiducial markers.
2. Additional bilateral pulmonary nodules stable back to 06/19/2019
most consistent with benign etiology.
3. Aortic Atherosclerosis (82JPO-F5M.M) and Emphysema (82JPO-6B9.Z).

## 2021-10-12 ENCOUNTER — Other Ambulatory Visit: Payer: Self-pay

## 2021-10-12 ENCOUNTER — Ambulatory Visit: Payer: Self-pay

## 2021-10-12 ENCOUNTER — Ambulatory Visit: Payer: Medicare Other | Admitting: Orthopedic Surgery

## 2021-10-12 DIAGNOSIS — M25571 Pain in right ankle and joints of right foot: Secondary | ICD-10-CM | POA: Diagnosis not present

## 2021-10-13 ENCOUNTER — Encounter: Payer: Self-pay | Admitting: Orthopedic Surgery

## 2021-10-13 NOTE — Progress Notes (Signed)
Office Visit Note   Patient: Julie Mann           Date of Birth: [birth year]           MRN: [id number] Visit Date: 10/12/2021              Requested by: Lajean Manes, MD [address] [place],  [place] [id number] PCP: Lajean Manes, MD  Chief Complaint  Patient presents with   Right Ankle - Pain      HPI: Patient is a 75 year old woman who states she has been having increasing right ankle pain for the past month.  She is status post hardware removal from the fibula for a trimalleolar ankle fracture.  She states she recently stopped a little toe and has had ecchymosis and bruising.  She states she has irritation from the foot to the leg and that this irritation is constant.  Patient states she exercises every day.  Assessment & Plan: Visit Diagnoses:  1. 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  Recommend continue exercise and strengthening.  Follow-Up Instructions: Return if symptoms worsen or fail to improve.   Ortho Exam  Patient is alert, oriented, no adenopathy, well-dressed, normal affect, normal respiratory effort. Examination patient has a normal gait.  There is no pain with passive range of motion of the right ankle she does have some ecchymosis over the forefoot laterally secondary to recent blunt trauma.  There is no angular deformity to the toes.  There is mild swelling of the foot and ankle.  Imaging: No results found. No images are attached to the encounter.  Labs: Lab Results  Component Value Date   REPTSTATUS 02/10/2021 FINAL 02/05/2021   GRAMSTAIN  02/05/2021    MODERATE WBC PRESENT,BOTH PMN AND MONONUCLEAR NO ORGANISMS SEEN    CULT  02/05/2021    RARE STAPHYLOCOCCUS CAPITIS RARE STAPHYLOCOCCUS EPIDERMIDIS NO ANAEROBES ISOLATED Performed at [facility], [address], [place], [place] [address]    [place] 02/05/2021   LABORGA  STAPHYLOCOCCUS EPIDERMIDIS 02/05/2021     Lab Results  Component Value Date   ALBUMIN 3.6 08/25/2020   ALBUMIN 4.1 03/24/2009   ALBUMIN 3.9 09/24/2008    No results found for: MG No results found for: VD25OH  No results found for: PREALBUMIN CBC EXTENDED Latest Ref Rng & Units 02/05/2021 08/25/2020 03/10/2019  WBC 4.0 - 10.5 K/uL 7.5 6.6 13.1(H)  RBC 3.87 - 5.11 MIL/uL 5.22(H) 4.76 4.70  HGB 12.0 - 15.0 g/dL 13.9 12.6 12.8  HCT 36.0 - 46.0 % 45.2 40.5 40.5  PLT 150 - 400 K/uL 391 321 287  NEUTROABS 1.7 - 7.7 K/uL - - -  LYMPHSABS 0.7 - 4.0 K/uL - - -     There is no height or weight on file to calculate BMI.  Orders:  Orders Placed This Encounter  Procedures   XR Foot 2 Views Right   XR Ankle Complete Right   No orders of the defined types were placed in this encounter.    Procedures: No procedures performed  Clinical Data: No additional findings.  ROS:  All other systems negative, except as noted in the HPI. Review of Systems  Objective: Vital Signs: LMP 11/14/1992   Specialty Comments:  No specialty comments available.  PMFS History: Patient Active Problem List   Diagnosis Date Noted   PVD (posterior vitreous detachment) 07/27/2021  Pseudophakia of right eye 07/27/2021   Pseudophakia of left eye 07/27/2021   Vitreomacular adhesion of right eye 07/27/2021   Macular pucker, right eye 96/78/9381   Hardware complicating wound infection ([place])    Neck pain 10/05/2020   Nodule of upper lobe of right lung 08/17/2020   Biceps tendonosis of left shoulder 02/13/2020   Impingement syndrome of left shoulder 02/13/2020   Post-operative state 02/13/2020   Ankle dislocation, right, initial encounter 03/10/2019   Trimalleolar fracture of ankle, closed, right, initial encounter    Chronic left shoulder pain 02/19/2019   Anxiety 12/17/2016   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  stairs (If Iam tired)   Dysrhythmia    PAF   Fall from horse 01/2018   Fracture, ankle 02/2019   Right    High cholesterol    History of stomach ulcers ~ 1966   Osteopenia     Family History  Problem Relation Age of Onset   Breast cancer Maternal Aunt    Breast cancer Maternal Grandmother    Heart disease Mother    Stroke Father    Rectal cancer Paternal Grandmother    Breast cancer Sister 32   Breast cancer Sister 57    Past Surgical History:  Procedure Laterality Date   ANUS SURGERY  2005   "biopsy"   BREAST CYST EXCISION Right 1966   DILATION AND CURETTAGE OF UTERUS  1980's   S/P miscarriage   EXTERNAL FIXATION LEG Right 03/10/2019   Procedure: OPEN REDUCTION INTERNAL FIXATION RIGHT ANKLE;  Surgeon: Marchia Bond, MD;  Location: [facility];  Service: Orthopedics;  Laterality: Right;   HARDWARE REMOVAL Right 02/05/2021   Procedure: REMOVAL OF HARDWARE RIGHT ANKLE;  Surgeon: Newt Minion, MD;  Location: [facility];  Service: Orthopedics;  Laterality: Right;   VARICOSE VEIN SURGERY Left    left leg   VIDEO BRONCHOSCOPY WITH ENDOBRONCHIAL NAVIGATION Right 08/25/2020   Procedure: VIDEO BRONCHOSCOPY WITH ENDOBRONCHIAL NAVIGATION WITH FIDUCIAL PLACEMENT;  Surgeon: Garner Nash, DO;  Location: [facility];  Service: Pulmonary;  Laterality: Right;   Social History   Occupational History   Not on file  Tobacco Use   Smoking status: Former    Packs/day: 1.00    Years: 30.00    Pack years: 30.00    Types: Cigarettes    Quit date: 2005    Years since quitting: 17.9   Smokeless tobacco: Never   Vaping Use   Vaping Use: Never used  Substance and Sexual Activity   Alcohol use: No   Drug use: No   Sexual activity: Not Currently    Partners: Male    Birth control/protection: Post-menopausal

## 2021-10-18 DIAGNOSIS — Z85828 Personal history of other malignant neoplasm of skin: Secondary | ICD-10-CM | POA: Diagnosis not present

## 2021-10-18 DIAGNOSIS — D234 Other benign neoplasm of skin of scalp and neck: Secondary | ICD-10-CM | POA: Diagnosis not present

## 2021-10-18 DIAGNOSIS — Z08 Encounter for follow-up examination after completed treatment for malignant neoplasm: Secondary | ICD-10-CM | POA: Diagnosis not present

## 2021-10-18 DIAGNOSIS — L308 Other specified dermatitis: Secondary | ICD-10-CM | POA: Diagnosis not present

## 2021-10-19 ENCOUNTER — Ambulatory Visit
Admission: RE | Admit: 2021-10-19 | Discharge: 2021-10-19 | Disposition: A | Discharge status: Home / Self Care | Payer: Medicare Other | Source: Ambulatory Visit | Attending: Family Medicine | Admitting: Family Medicine

## 2021-10-19 DIAGNOSIS — Z1231 Encounter for screening mammogram for malignant neoplasm of breast: Secondary | ICD-10-CM | POA: Diagnosis not present

## 2021-10-21 ENCOUNTER — Ambulatory Visit: Payer: Self-pay

## 2021-10-21 ENCOUNTER — Other Ambulatory Visit (HOSPITAL_COMMUNITY): Payer: Self-pay

## 2021-10-21 ENCOUNTER — Ambulatory Visit: Payer: Medicare Other | Admitting: Orthopedic Surgery

## 2021-10-21 DIAGNOSIS — G8929 Other chronic pain: Secondary | ICD-10-CM

## 2021-10-21 DIAGNOSIS — M25571 Pain in right ankle and joints of right foot: Secondary | ICD-10-CM | POA: Diagnosis not present

## 2021-10-21 DIAGNOSIS — M5441 Lumbago with sciatica, right side: Secondary | ICD-10-CM

## 2021-10-21 MED ORDER — PREDNISONE 10 MG PO TABS
10.0000 mg | ORAL_TABLET | Freq: Every day | ORAL | 0 refills | Status: DC
Start: 2021-10-21 — End: 2021-12-06
  Filled 2021-10-21: qty 30, 30d supply, fill #0

## 2021-10-21 NOTE — Progress Notes (Signed)
Office Visit Note   Patient: Julie Mann           Date of Birth: [birth year]           MRN: [id number] Visit Date: 10/21/2021              Requested by: Lajean Manes, MD [address] [place],  [place] [address] PCP: Lajean Manes, MD  Chief Complaint  Patient presents with   Right Ankle - Follow-up    Hx HDW removal 02/05/21      HPI: Patient is a 75 year old woman who is status post hardware removal of the fibula from a trimalleolar fracture hardware removal on 02/05/2021.  Patient states she has pain in the foot and ankle that keeps her up at night.  Patient states the pain travels from the lateral foot all the way up to her hip.  Patient states she is afraid that her ankle is infected.  She states the pain is worse at night.  Assessment & Plan: Visit Diagnoses:  1. Pain in right ankle and joints of right foot   2. Chronic right-sided low back pain with right-sided sciatica     Plan: Patient is given a prescription for prednisone to treat the radicular symptoms.  There is no signs of infection in the ankle.  Follow-Up Instructions: Return in about 4 weeks (around 11/18/2021).   Ortho Exam  Patient is alert, oriented, no adenopathy, well-dressed, normal affect, normal respiratory effort. Examination patient has a negative straight leg raise without focal motor weakness patient's radicular symptoms from the buttocks to the lateral leg appears more radicular.  There is no redness swelling or cellulitis around the ankle and no tenderness to palpation.  Radiograph shows no bony changes.  Imaging: No results found. No images are attached to the encounter.  Labs: Lab Results  Component Value Date   REPTSTATUS 02/10/2021 FINAL 02/05/2021   GRAMSTAIN  02/05/2021    MODERATE WBC PRESENT,BOTH PMN AND MONONUCLEAR NO ORGANISMS SEEN    CULT  02/05/2021    RARE STAPHYLOCOCCUS CAPITIS RARE STAPHYLOCOCCUS EPIDERMIDIS NO ANAEROBES ISOLATED Performed at [facility], [address], [place], [place] [address]    [place] 02/05/2021   LABORGA STAPHYLOCOCCUS EPIDERMIDIS 02/05/2021     Lab Results  Component Value Date   ALBUMIN 3.6 08/25/2020   ALBUMIN 4.1 03/24/2009   ALBUMIN 3.9 09/24/2008    No results found for: MG No results found for: VD25OH  No results found for: PREALBUMIN CBC EXTENDED Latest Ref Rng & Units 02/05/2021 08/25/2020 03/10/2019  WBC 4.0 - 10.5 K/uL 7.5 6.6 13.1(H)  RBC 3.87 - 5.11 MIL/uL 5.22(H) 4.76 4.70  HGB 12.0 - 15.0 g/dL 13.9 12.6 12.8  HCT 36.0 - 46.0 % 45.2 40.5 40.5  PLT 150 - 400 K/uL 391 321 287  NEUTROABS 1.7 - 7.7 K/uL - - -  LYMPHSABS 0.7 - 4.0 K/uL - - -     There is no height or weight on file to calculate BMI.  Orders:  Orders Placed This Encounter  Procedures   XR Ankle 2 Views Right   XR Lumbar Spine 2-3 Views   Meds ordered this encounter  Medications   predniSONE (DELTASONE) 10 MG tablet    Sig: Take 1 tablet (10 mg total) by mouth daily with breakfast.    Dispense:  30 tablet    Refill:  0     Procedures: No procedures performed  Clinical Data: No  additional findings.  ROS:  All other systems negative, except as noted in the HPI. Review of Systems  Objective: Vital Signs: LMP 11/14/1992   Specialty Comments:  No specialty comments available.  PMFS History: Patient Active Problem List   Diagnosis Date Noted   PVD (posterior vitreous detachment) 07/27/2021   Pseudophakia of right eye 07/27/2021   Pseudophakia of left eye 07/27/2021   Vitreomacular adhesion of right eye 07/27/2021   Macular pucker, right eye 08/67/6195   Hardware complicating wound infection (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  stairs (If Iam tired)   Dysrhythmia    PAF   Fall from horse 01/2018   Fracture, ankle 02/2019   Right    High cholesterol    History of stomach ulcers ~ 1966   Osteopenia     Family History  Problem Relation Age of Onset   Breast cancer Maternal Aunt    Breast cancer Maternal Grandmother    Heart disease Mother    Stroke Father    Rectal cancer Paternal Grandmother    Breast cancer Sister 36   Breast cancer Sister 56    Past Surgical History:  Procedure Laterality Date   ANUS SURGERY  2005   "biopsy"   BREAST CYST EXCISION Right 1966   DILATION AND CURETTAGE OF UTERUS  1980's   S/P miscarriage   EXTERNAL FIXATION LEG Right 03/10/2019   Procedure: OPEN REDUCTION INTERNAL FIXATION RIGHT ANKLE;  Surgeon: Marchia Bond, MD;  Location: [facility];  Service: Orthopedics;  Laterality: Right;   HARDWARE REMOVAL Right 02/05/2021   Procedure: REMOVAL OF HARDWARE RIGHT ANKLE;  Surgeon: Newt Minion, MD;  Location: [facility];  Service: Orthopedics;  Laterality: Right;   VARICOSE VEIN SURGERY Left    left leg   VIDEO BRONCHOSCOPY WITH ENDOBRONCHIAL NAVIGATION Right 08/25/2020   Procedure: VIDEO BRONCHOSCOPY WITH ENDOBRONCHIAL NAVIGATION WITH FIDUCIAL PLACEMENT;  Surgeon: Garner Nash, DO;  Location: [facility];  Service: Pulmonary;  Laterality: Right;   Social History    Occupational History   Not on file  Tobacco Use   Smoking status: Former    Packs/day: 1.00    Years: 30.00    Pack years: 30.00    Types: Cigarettes    Quit date: 2005    Years since quitting: 17.9   Smokeless tobacco: Never  Vaping Use   Vaping Use: Never used  Substance and Sexual Activity   Alcohol use: No   Drug use: No   Sexual activity: Not Currently    Partners: Male    Birth control/protection:  Post-menopausal

## 2021-10-26 ENCOUNTER — Other Ambulatory Visit: Payer: Self-pay

## 2021-10-26 ENCOUNTER — Encounter: Payer: Self-pay | Admitting: Orthopedic Surgery

## 2021-10-26 ENCOUNTER — Telehealth: Payer: Self-pay | Admitting: Orthopedic Surgery

## 2021-10-26 DIAGNOSIS — M25571 Pain in right ankle and joints of right foot: Secondary | ICD-10-CM

## 2021-10-26 NOTE — Telephone Encounter (Signed)
Pt called stating she had an appt with Dr.Duda 10/21/21 and he prescribed her prednisone but it made her sick. Pt spoke to PT upstairs and would like to have a referral sent in, she states PT said it would be fine if Dr.Duda okayed it. Pt would like a CB when this has been done so she can start scheduling her appts.   [phone]

## 2021-10-26 NOTE — Telephone Encounter (Signed)
Patient called. She would like a referral for PT to come here.

## 2021-10-26 NOTE — Telephone Encounter (Signed)
Duplicate

## 2021-10-26 NOTE — Telephone Encounter (Signed)
Called pt to advise that referral for PT upstairs has been entered. To call questions.

## 2021-11-03 ENCOUNTER — Ambulatory Visit (HOSPITAL_BASED_OUTPATIENT_CLINIC_OR_DEPARTMENT_OTHER): Payer: Medicare Other | Admitting: Obstetrics & Gynecology

## 2021-11-16 ENCOUNTER — Ambulatory Visit: Payer: Medicare Other | Admitting: Rehabilitative and Restorative Service Providers"

## 2021-11-16 ENCOUNTER — Encounter: Payer: Self-pay | Admitting: Rehabilitative and Restorative Service Providers"

## 2021-11-16 ENCOUNTER — Other Ambulatory Visit: Payer: Self-pay

## 2021-11-16 DIAGNOSIS — G8929 Other chronic pain: Secondary | ICD-10-CM | POA: Diagnosis not present

## 2021-11-16 DIAGNOSIS — R262 Difficulty in walking, not elsewhere classified: Secondary | ICD-10-CM | POA: Diagnosis not present

## 2021-11-16 DIAGNOSIS — M5441 Lumbago with sciatica, right side: Secondary | ICD-10-CM | POA: Diagnosis not present

## 2021-11-16 DIAGNOSIS — M25571 Pain in right ankle and joints of right foot: Secondary | ICD-10-CM | POA: Diagnosis not present

## 2021-11-16 NOTE — Patient Instructions (Signed)
Access Code: [email or web address] URL: [email or web address] Date: 11/16/2021 Prepared by: Scot Jun  Exercises Standing Lumbar Extension with Counter - 2 x daily - 7 x weekly - 1-2 sets - 5-10 reps Static Prone on Elbows - 2 x daily - 7 x weekly - 1-2 sets - 5-10 reps Supine Lower Trunk Rotation - 2 x daily - 7 x weekly - 5 reps - 1 sets - 15 hold Supine Piriformis Stretch Pulling Heel to Hip (Mirrored) - 2 x daily - 7 x weekly - 1 sets - 5 reps - 15-30 hold Supine 90/90 Sciatic Nerve Glide with Knee Flexion/Extension - 2 x daily - 7 x weekly - 1-2 sets - 10 reps

## 2021-11-16 NOTE — Therapy (Signed)
[facility] [address] [place], [place], [address] Phone: [phone]   Fax:  [phone]  Physical Therapy Evaluation  Patient Details  Name: Julie Mann MRN: [id number] Date of Birth: 01-26-1946 Referring Provider (PT): Newt Minion, MD   Encounter Date: 11/16/2021   PT End of Session - 11/16/21 0933     Visit Number 1    Number of Visits 20    Date for PT Re-Evaluation 01/25/22    Authorization Type [employer] Medicare $20 copay    Progress Note Due on Visit 10    PT Start Time 0935    PT Stop Time 4315    PT Time Calculation (min) 40 min    Activity Tolerance Patient tolerated treatment well    Behavior During Therapy [facility] for tasks assessed/performed             Past Medical History:  Diagnosis Date   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  stairs (If Iam tired)   Dysrhythmia    PAF   Fall from horse 01/2018   Fracture, ankle 02/2019   Right    High cholesterol    History of stomach ulcers ~ 1966   Osteopenia     Past Surgical History:  Procedure Laterality Date   ANUS SURGERY  2005   "biopsy"   BREAST CYST EXCISION Right 1966   [place]  1980's   S/P miscarriage   EXTERNAL FIXATION LEG Right 03/10/2019   Procedure: OPEN REDUCTION INTERNAL FIXATION RIGHT ANKLE;  Surgeon: Marchia Bond, MD;  Location: [facility];  Service: Orthopedics;  Laterality: Right;   HARDWARE REMOVAL Right 02/05/2021   Procedure: REMOVAL OF HARDWARE RIGHT ANKLE;  Surgeon: Newt Minion, MD;  Location: [facility];  Service: Orthopedics;  Laterality: Right;   VARICOSE VEIN SURGERY Left    left leg   VIDEO BRONCHOSCOPY WITH ENDOBRONCHIAL NAVIGATION Right 08/25/2020   Procedure: VIDEO BRONCHOSCOPY  WITH ENDOBRONCHIAL NAVIGATION WITH FIDUCIAL PLACEMENT;  Surgeon: Garner Nash, DO;  Location: [facility];  Service: Pulmonary;  Laterality: Right;    There were no vitals filed for this visit.    Subjective Assessment - 11/16/21 0938     Subjective Pt. indicated surgery in march to remove ankle hardware.  Pt. stated persistent pain noted and also reported having pain in Rt back/hip and down lateral Rt leg to ankle/foot.    Pertinent History status post hardware removal of the fibula from a trimalleolar fracture hardware removal on 02/05/2021, COPD, high cholesterol, osteopenia.  Pt. stated having back pain without leg pain at times.  Worse at night.   Pt. indicated possibly having times of leg pain without back pain.    Limitations Standing;Walking;House hold activities;Lifting;Other (comment)   lying down   Diagnostic tests xray in chart    Patient Stated Goals Reduce pain    Currently in Pain? Yes    Pain Score 4    at worst   Pain Location Back    Pain Orientation Right    Pain Descriptors / Indicators Aching;Throbbing    Pain Type Chronic pain    Pain Radiating Towards Rt leg to foot/ankle    Pain Onset  More than a month ago    Pain Frequency Constant    Aggravating Factors  lying down at night, gardenening and walking    Pain Relieving Factors exercise routine from past can help at times                [facility] PT Assessment - 11/16/21 0001       Assessment   Medical Diagnosis Rt ankle s/p hardware removal 02/05/2021, Sciatica    Referring Provider (PT) Newt Minion, MD    Onset Date/Surgical Date 02/05/21   reported back/leg symptoms for months     Precautions   Precautions None      Restrictions   Weight Bearing Restrictions No      Balance Screen   Has the patient fallen in the past 6 months No    Has the patient had a decrease in activity level because of a fear of falling?  No    Is the patient reluctant to leave their home because of a fear of falling?  No       Home Environment   Additional Comments flight of stairs      Prior Function   Level of Independence Independent    Leisure gardening, musical peformances (sitting)      Observation/Other Assessments   Focus on Therapeutic Outcomes (FOTO)  not included (was performed for ankle only)      Sensation   Light Touch Appears Intact      Functional Tests   Functional tests Single leg stance      Posture/Postural Control   Posture Comments No observed lateral shift or deviation in standing posture.      ROM / Strength   AROM / PROM / Strength PROM;AROM;Strength      AROM   Overall AROM Comments Started with complaints in Rt lumbar down posterior/lateral leg to Rt foot prior to assessment.  Prone press up on elbows produced ERP, improved Rt thigh and lower leg symptoms.  Inconsistent symptom reporting in standing lumbar extension repeated movements but in general seen as mild improvement or no change (no peripherialization).   Rt ankle ROM to be assessed in future visit.    AROM Assessment Site Ankle;Lumbar    Right/Left Shoulder Left;Right    Lumbar Flexion movement to toes with increased intensity of baseline symptoms    Lumbar Extension 50% mild complaints end range.  repeated x 5 with no complaint changes.    Lumbar - Right Side Bend to lateral knee joint, no complaints    Lumbar - Left Side Bend to lateral epicondyle with Rt lumbar pulling      PROM   PROM Assessment Site Ankle    Right/Left Ankle Left;Right      Strength   Overall Strength Comments strength testing held to future visit for ankle assessment    Strength Assessment Site Ankle;Knee;Hip    Right/Left Hip Left;Right    Right/Left Knee Right;Left    Right/Left Ankle Right;Left      Special Tests   Other special tests + Rt leg radicular symptoms in supine slump testing on Rt                        Objective measurements completed on examination: See above findings.       [facility] Adult PT  Treatment/Exercise - 11/16/21 0001       Exercises   Exercises Other Exercises    Other Exercises  HEP instruction/performance c  cues for techniques, handout provided.  Trial set performed of each for comprehension and symptom assessment.  HEP instruction/performance for lumbar extension standing, prone press up on elbows, supine piriformis figure 4 into flexion stretch, supine sciatic nerve flossing                     PT Education - 11/16/21 0933     Education Details HEP, POC    Person(s) Educated Patient    Methods Explanation;Demonstration;Handout    Comprehension Returned demonstration;Verbalized understanding              PT Short Term Goals - 11/16/21 0933       PT SHORT TERM GOAL #1   Title Patient will demonstrate independent use of home exercise program to maintain progress from in clinic treatments.    Time 3    Period Weeks    Status New    Target Date 12/07/21               PT Long Term Goals - 11/16/21 0933       PT LONG TERM GOAL #1   Title Patient will demonstrate/report pain at worst less than or equal to 2/10 to facilitate minimal limitation in daily activity secondary to pain symptoms.    Time 10    Period Weeks    Status New    Target Date 01/25/22      PT LONG TERM GOAL #2   Title Patient will demonstrate independent use of home exercise program to facilitate ability to maintain/progress functional gains from skilled physical therapy services.    Time 10    Period Weeks    Status New    Target Date 01/25/22      PT LONG TERM GOAL #3   Title Pt. will demonstrate lumbar ext AROM > or = 100 % to facilitate upright standing posture at PLOF.    Time 10    Period Weeks    Status New    Target Date 01/25/22      PT LONG TERM GOAL #4   Title Pt. will demonstrate Rt leg slump testing negative, equal to Lt s symptoms for normalized mobility.    Time 10    Period Weeks    Status New    Target Date 01/25/22      PT LONG TERM  GOAL #5   Title Future Rt ankle mobility/strength goals upon return to clinic.                    Plan - 11/16/21 0934     Clinical Impression Statement Patient is a 76 y.o. who comes to clinic with complaints of low back pain c Rt leg radicular symptoms c possible directional preference for extension as well as Rt ankle pain with mobility, strength and movement coordination deficits that impair their ability to perform usual daily and recreational functional activities without increase difficulty/symptoms at this time.  Patient to benefit from skilled PT services to address impairments and limitations to improve to previous level of function without restriction secondary to condition.    Personal Factors and Comorbidities Comorbidity 3+    Comorbidities COPD, high cholesterol, osteopenia    Examination-Activity Limitations Sleep;Bed Mobility;Sit;Bend;Stand;Locomotion Level    Examination-Participation Restrictions Community Activity;Shop;Yard Work;Occupation    Stability/Clinical Decision Making Stable/Uncomplicated    Clinical Decision Making Low    Rehab Potential Good    PT Frequency Other (comment)   1-2x/week   PT Duration Other (  comment)   10 weeks   PT Treatment/Interventions ADLs/Self Care Home Management;Electrical Stimulation;Cryotherapy;Iontophoresis 4mg /ml Dexamethasone;Moist Heat;Balance training;Therapeutic exercise;Therapeutic activities;Stair training;Functional mobility training;Gait training;Ultrasound;DME Instruction;Patient/family education;Passive range of motion;Dry needling;Joint Manipulations;Vasopneumatic Device;Taping;Manual techniques    PT Next Visit Plan Reassess HEP knowledge, assess ankle mobility and centralization c extension response?    PT Home Exercise Plan [email or web address]    Consulted and Agree with Plan of Care Patient             Patient will benefit from skilled therapeutic intervention in order to improve the following deficits and impairments:   Abnormal gait, Hypomobility, Pain, Decreased strength, Decreased activity tolerance, Increased fascial restricitons, Difficulty walking, Decreased mobility, Decreased balance, Decreased range of motion, Impaired perceived functional ability, Improper body mechanics, Impaired flexibility, Decreased coordination  Visit Diagnosis: Chronic right-sided low back pain with right-sided sciatica  Pain in right ankle and joints of right foot  Difficulty in walking, not elsewhere classified     Problem List Patient Active Problem List   Diagnosis Date Noted   PVD (posterior vitreous detachment) 07/27/2021   Pseudophakia of right eye 07/27/2021   Pseudophakia of left eye 07/27/2021   Vitreomacular adhesion of right eye 07/27/2021   Macular pucker, right eye 83/33/8329   Hardware complicating wound infection ([place])    Neck pain 10/05/2020   Nodule of upper lobe of right lung 08/17/2020   Biceps tendonosis of left shoulder 02/13/2020   Impingement syndrome of left shoulder 02/13/2020   Post-operative state 02/13/2020   Ankle dislocation, right, initial encounter 03/10/2019   Trimalleolar fracture of ankle, closed, right, initial encounter    Chronic left shoulder pain 02/19/2019   Anxiety 12/17/2016   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

## 2021-11-18 ENCOUNTER — Ambulatory Visit: Payer: Medicare Other | Admitting: Orthopedic Surgery

## 2021-11-18 DIAGNOSIS — Z0000 Encounter for general adult medical examination without abnormal findings: Secondary | ICD-10-CM | POA: Diagnosis not present

## 2021-11-18 DIAGNOSIS — E7800 Pure hypercholesterolemia, unspecified: Secondary | ICD-10-CM | POA: Diagnosis not present

## 2021-11-18 DIAGNOSIS — I700 Atherosclerosis of aorta: Secondary | ICD-10-CM | POA: Diagnosis not present

## 2021-11-18 DIAGNOSIS — K219 Gastro-esophageal reflux disease without esophagitis: Secondary | ICD-10-CM | POA: Diagnosis not present

## 2021-11-18 DIAGNOSIS — M5431 Sciatica, right side: Secondary | ICD-10-CM | POA: Diagnosis not present

## 2021-11-18 DIAGNOSIS — J440 Chronic obstructive pulmonary disease with acute lower respiratory infection: Secondary | ICD-10-CM | POA: Diagnosis not present

## 2021-11-18 DIAGNOSIS — Z79899 Other long term (current) drug therapy: Secondary | ICD-10-CM | POA: Diagnosis not present

## 2021-11-18 DIAGNOSIS — R911 Solitary pulmonary nodule: Secondary | ICD-10-CM | POA: Diagnosis not present

## 2021-11-18 DIAGNOSIS — I480 Paroxysmal atrial fibrillation: Secondary | ICD-10-CM | POA: Diagnosis not present

## 2021-11-19 ENCOUNTER — Ambulatory Visit: Payer: Medicare Other | Admitting: Physical Therapy

## 2021-11-19 ENCOUNTER — Encounter: Payer: Self-pay | Admitting: Physical Therapy

## 2021-11-19 ENCOUNTER — Other Ambulatory Visit: Payer: Self-pay

## 2021-11-19 DIAGNOSIS — M25571 Pain in right ankle and joints of right foot: Secondary | ICD-10-CM | POA: Diagnosis not present

## 2021-11-19 DIAGNOSIS — G8929 Other chronic pain: Secondary | ICD-10-CM

## 2021-11-19 DIAGNOSIS — M5441 Lumbago with sciatica, right side: Secondary | ICD-10-CM

## 2021-11-19 DIAGNOSIS — R262 Difficulty in walking, not elsewhere classified: Secondary | ICD-10-CM

## 2021-11-19 NOTE — Therapy (Signed)
[facility] [address] [place], [place], [address] Phone: [phone]   Fax:  [phone]  Physical Therapy Treatment  Patient Details  Name: Julie Mann MRN: [id number] Date of Birth: [birth year] Referring Provider (PT): Newt Minion, MD   Encounter Date: 11/19/2021   PT End of Session - 11/19/21 1056     Visit Number 2    Number of Visits 20    Date for PT Re-Evaluation 01/25/22    Authorization Type [employer] Medicare $20 copay    Progress Note Due on Visit 10    PT Start Time 1015    PT Stop Time 1056    PT Time Calculation (min) 41 min    Activity Tolerance Patient tolerated treatment well    Behavior During Therapy [facility] for tasks assessed/performed             Past Medical History:  Diagnosis Date   Anal cancer ([place]) 2005   SCCa anus-stage II chemo, radiation   Anxiety    Arthritis    "hands, fingers, back" (02/27/2014)   Atrial fibrillation ([place])    Cat scratch of right hand 02/24/2014   COPD (chronic obstructive pulmonary disease) ([place])    Dyspnea    walking up hill;.  stairs (If Iam tired)   Dysrhythmia    PAF   Fall from horse 01/2018   Fracture, ankle 02/2019   Right    High cholesterol    History of stomach ulcers ~ 1966   Osteopenia     Past Surgical History:  Procedure Laterality Date   ANUS SURGERY  2005   "biopsy"   BREAST CYST EXCISION Right 1966   [place]  1980's   S/P miscarriage   EXTERNAL FIXATION LEG Right 03/10/2019   Procedure: OPEN REDUCTION INTERNAL FIXATION RIGHT ANKLE;  Surgeon: Marchia Bond, MD;  Location: [place];  Service: Orthopedics;  Laterality: Right;   HARDWARE REMOVAL Right 02/05/2021   Procedure: REMOVAL OF HARDWARE RIGHT ANKLE;  Surgeon: Newt Minion, MD;  Location: [place];  Service: Orthopedics;  Laterality: Right;   VARICOSE VEIN SURGERY Left    left leg   VIDEO BRONCHOSCOPY WITH ENDOBRONCHIAL NAVIGATION Right 08/25/2020   Procedure: VIDEO BRONCHOSCOPY  WITH ENDOBRONCHIAL NAVIGATION WITH FIDUCIAL PLACEMENT;  Surgeon: Garner Nash, DO;  Location: [place];  Service: Pulmonary;  Laterality: Right;    There were no vitals filed for this visit.   Subjective Assessment - 11/19/21 1017     Subjective can't do prone press up due to shoulder- other exercises are going well    Pertinent History status post hardware removal of the fibula from a trimalleolar fracture hardware removal on 02/05/2021, COPD, high cholesterol, osteopenia.  Pt. stated having back pain without leg pain at times.  Worse at night.   Pt. indicated possibly having times of leg pain without back pain.    Limitations Standing;Walking;House hold activities;Lifting;Other (comment)   lying down   Diagnostic tests xray in chart    Patient Stated Goals Reduce pain    Currently in Pain? Yes    Pain Score 0-No pain    Pain Orientation Right    Pain Descriptors / Indicators Aching;Throbbing    Pain Type Chronic pain    Pain Onset More than a month ago    Pain Frequency Constant    Aggravating Factors  worse at night with lying down, gardening, walking    Pain Relieving Factors exercises  [place] Adult PT Treatment/Exercise - 11/19/21 1020       Exercises   Exercises Lumbar      Lumbar Exercises: Stretches   Lower Trunk Rotation 3 reps;20 seconds    Lower Trunk Rotation Limitations bil    Standing Extension 10 reps;10 seconds    Press Ups 10 reps;10 seconds    Press Ups Limitations press up on elbows only    Piriformis Stretch Right;Left;3 reps;20 seconds    Other Lumbar Stretch Exercise Rt sciatic nerve glides x20 reps; 3-5 sec hold each DF/PF      Lumbar Exercises: Supine   Bridge 10 reps;5 seconds    Bridge Limitations with strap for isometric clam    Other Supine Lumbar Exercises isometric clam 10 x 5 sec hold                       PT Short Term Goals - 11/16/21 0933       PT SHORT TERM GOAL #1   Title  Patient will demonstrate independent use of home exercise program to maintain progress from in clinic treatments.    Time 3    Period Weeks    Status New    Target Date 12/07/21               PT Long Term Goals - 11/16/21 0933       PT LONG TERM GOAL #1   Title Patient will demonstrate/report pain at worst less than or equal to 2/10 to facilitate minimal limitation in daily activity secondary to pain symptoms.    Time 10    Period Weeks    Status New    Target Date 01/25/22      PT LONG TERM GOAL #2   Title Patient will demonstrate independent use of home exercise program to facilitate ability to maintain/progress functional gains from skilled physical therapy services.    Time 10    Period Weeks    Status New    Target Date 01/25/22      PT LONG TERM GOAL #3   Title Pt. will demonstrate lumbar ext AROM > or = 100 % to facilitate upright standing posture at PLOF.    Time 10    Period Weeks    Status New    Target Date 01/25/22      PT LONG TERM GOAL #4   Title Pt. will demonstrate Rt leg slump testing negative, equal to Lt s symptoms for normalized mobility.    Time 10    Period Weeks    Status New    Target Date 01/25/22      PT LONG TERM GOAL #5   Title Future Rt ankle mobility/strength goals upon return to clinic.                   Plan - 11/19/21 1057     Clinical Impression Statement Pt tolerated session well today needing mod cues for HEP instruction.  Did modify prone press up to elbows only due to prior shoulder surgery/injury.  Will continue to benefit from PT to maximzie function.  Reports slight improvement in pain over past few days.    Personal Factors and Comorbidities Comorbidity 3+    Comorbidities COPD, high cholesterol, osteopenia    Examination-Activity Limitations Sleep;Bed Mobility;Sit;Bend;Stand;Locomotion Level    Examination-Participation Restrictions Community Activity;Shop;Yard Work;Occupation    Stability/Clinical Decision  Making Stable/Uncomplicated    Rehab Potential Good    PT Frequency Other (comment)  1-2x/week   PT Duration Other (comment)   10 weeks   PT Treatment/Interventions ADLs/Self Care Home Management;Electrical Stimulation;Cryotherapy;Iontophoresis 4mg /ml Dexamethasone;Moist Heat;Balance training;Therapeutic exercise;Therapeutic activities;Stair training;Functional mobility training;Gait training;Ultrasound;DME Instruction;Patient/family education;Passive range of motion;Dry needling;Joint Manipulations;Vasopneumatic Device;Taping;Manual techniques    PT Next Visit Plan check ankle mobility, continue extension focus with hip strengthening    PT Home Exercise Plan [email or web address]    Consulted and Agree with Plan of Care Patient             Patient will benefit from skilled therapeutic intervention in order to improve the following deficits and impairments:  Abnormal gait, Hypomobility, Pain, Decreased strength, Decreased activity tolerance, Increased fascial restricitons, Difficulty walking, Decreased mobility, Decreased balance, Decreased range of motion, Impaired perceived functional ability, Improper body mechanics, Impaired flexibility, Decreased coordination  Visit Diagnosis: Chronic right-sided low back pain with right-sided sciatica  Pain in right ankle and joints of right foot  Difficulty in walking, not elsewhere classified     Problem List Patient Active Problem List   Diagnosis Date Noted   PVD (posterior vitreous detachment) 07/27/2021   Pseudophakia of right eye 07/27/2021   Pseudophakia of left eye 07/27/2021   Vitreomacular adhesion of right eye 07/27/2021   Macular pucker, right eye 87/57/9728   Hardware complicating wound infection ([place])    Neck pain 10/05/2020   Nodule of upper lobe of right lung 08/17/2020   Biceps tendonosis of left shoulder 02/13/2020   Impingement syndrome of left shoulder 02/13/2020   Post-operative state 02/13/2020   Ankle dislocation, right,  initial encounter 03/10/2019   Trimalleolar fracture of ankle, closed, right, initial encounter    Chronic left shoulder pain 02/19/2019   Anxiety 12/17/2016   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[place]) 12/17/2016   Osteopenia 12/17/2016   Paroxysmal atrial fibrillation ([place]) 12/17/2016   Pure hypercholesterolemia 12/17/2016   Rectal cancer ([place]) 07/30/2015      Laureen Abrahams, PT, DPT 11/19/21 10:59 AM     [facility] [address] [place], [place], [address] Phone: [phone]   Fax:  [phone]  Name: Julie Mann MRN: [id number] Date of Birth: 1946-01-13

## 2021-11-23 ENCOUNTER — Encounter: Payer: Medicare Other | Admitting: Rehabilitative and Restorative Service Providers"

## 2021-11-23 ENCOUNTER — Encounter: Payer: Self-pay | Admitting: Orthopedic Surgery

## 2021-11-23 ENCOUNTER — Ambulatory Visit: Payer: Medicare Other | Admitting: Orthopedic Surgery

## 2021-11-23 DIAGNOSIS — M5441 Lumbago with sciatica, right side: Secondary | ICD-10-CM | POA: Diagnosis not present

## 2021-11-23 DIAGNOSIS — G8929 Other chronic pain: Secondary | ICD-10-CM

## 2021-11-23 NOTE — Progress Notes (Signed)
Office Visit Note   Patient: Julie Mann           Date of Birth: Jun 01, 1946           MRN: [id number] Visit Date: 11/23/2021              Requested by: Lajean Manes, MD [address] [place] [place],  [place] [address] PCP: Lajean Manes, MD  Chief Complaint  Patient presents with   Right Ankle - Follow-up      HPI: Patient is a 76 year old woman who presents in follow-up for right-sided radicular symptoms.  Patient states the prednisone made her feel awful and did not provide her any back relief.  She states the pain that is persistent radiating are from the right buttocks to the lateral aspect of the right foot.  She states the pain is worse with sitting and worse at night.  Assessment & Plan: Visit Diagnoses:  1. Chronic right-sided low back pain with right-sided sciatica     Plan: We will plan for an MRI scan of her lumbar spine and anticipate at follow-up set her up for epidural steroid injections with Dr. Ernestina Patches.  Follow-Up Instructions: Return in about 2 weeks (around 12/07/2021).   Ortho Exam  Patient is alert, oriented, no adenopathy, well-dressed, normal affect, normal respiratory effort. Examination patient has a positive straight leg raise on the right there is no pain with range of motion of hip knee or ankle there is no focal motor weakness.  Imaging: No results found. No images are attached to the encounter.  Labs: Lab Results  Component Value Date   REPTSTATUS 02/10/2021 FINAL 02/05/2021   GRAMSTAIN  02/05/2021    MODERATE WBC PRESENT,BOTH PMN AND MONONUCLEAR NO ORGANISMS SEEN    CULT  02/05/2021    RARE STAPHYLOCOCCUS CAPITIS RARE STAPHYLOCOCCUS EPIDERMIDIS NO ANAEROBES ISOLATED Performed at [facility], [address] [place], [place] [address]    [place] 02/05/2021   LABORGA STAPHYLOCOCCUS EPIDERMIDIS 02/05/2021     Lab Results  Component Value Date   ALBUMIN 3.6 08/25/2020   ALBUMIN 4.1  03/24/2009   ALBUMIN 3.9 09/24/2008    No results found for: MG No results found for: VD25OH  No results found for: PREALBUMIN CBC EXTENDED Latest Ref Rng & Units 02/05/2021 08/25/2020 03/10/2019  WBC 4.0 - 10.5 K/uL 7.5 6.6 13.1(H)  RBC 3.87 - 5.11 MIL/uL 5.22(H) 4.76 4.70  HGB 12.0 - 15.0 g/dL 13.9 12.6 12.8  HCT 36.0 - 46.0 % 45.2 40.5 40.5  PLT 150 - 400 K/uL 391 321 287  NEUTROABS 1.7 - 7.7 K/uL - - -  LYMPHSABS 0.7 - 4.0 K/uL - - -     There is no height or weight on file to calculate BMI.  Orders:  Orders Placed This Encounter  Procedures   MR Lumbar Spine w/o contrast   No orders of the defined types were placed in this encounter.    Procedures: No procedures performed  Clinical Data: No additional findings.  ROS:  All other systems negative, except as noted in the HPI. Review of Systems  Objective: Vital Signs: LMP 11/14/1992   Specialty Comments:  No specialty comments available.  PMFS History: Patient Active Problem List   Diagnosis Date Noted   PVD (posterior vitreous detachment) 07/27/2021   Pseudophakia of right eye 07/27/2021   Pseudophakia of left eye 07/27/2021   Vitreomacular adhesion of right eye 07/27/2021   Macular pucker, right eye 07/27/2021  Hardware complicating wound infection ([place])    Neck pain 10/05/2020   Nodule of upper lobe of right lung 08/17/2020   Biceps tendonosis of left shoulder 02/13/2020   Impingement syndrome of left shoulder 02/13/2020   Post-operative state 02/13/2020   Ankle dislocation, right, initial encounter 03/10/2019   Trimalleolar fracture of ankle, closed, right, initial encounter    Chronic left shoulder pain 02/19/2019   Anxiety 12/17/2016   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[place]) 12/17/2016   Osteopenia 12/17/2016   Paroxysmal atrial fibrillation ([place]) 12/17/2016   Pure hypercholesterolemia 12/17/2016   Rectal cancer ([place]) 07/30/2015   Past Medical History:  Diagnosis Date   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  stairs (If Iam tired)   Dysrhythmia    PAF   Fall from horse 01/2018   Fracture, ankle 02/2019   Right    High cholesterol    History of stomach ulcers ~ 1966   Osteopenia     Family History  Problem Relation Age of Onset   Breast cancer Maternal Aunt    Breast cancer Maternal Grandmother    Heart disease Mother    Stroke Father    Rectal cancer Paternal Grandmother    Breast cancer Sister 31   Breast cancer Sister 43    Past Surgical History:  Procedure Laterality Date   ANUS SURGERY  2005   "biopsy"   BREAST CYST EXCISION Right 1966   [place]  1980's   S/P miscarriage   EXTERNAL FIXATION LEG Right 03/10/2019   Procedure: OPEN REDUCTION INTERNAL FIXATION RIGHT ANKLE;  Surgeon: Marchia Bond, MD;  Location: [place];  Service: Orthopedics;  Laterality: Right;   HARDWARE REMOVAL Right 02/05/2021   Procedure: REMOVAL OF HARDWARE RIGHT ANKLE;  Surgeon: Newt Minion, MD;  Location: [place];  Service: Orthopedics;  Laterality: Right;   VARICOSE VEIN SURGERY Left    left leg   VIDEO BRONCHOSCOPY WITH ENDOBRONCHIAL NAVIGATION Right 08/25/2020   Procedure: VIDEO BRONCHOSCOPY WITH ENDOBRONCHIAL NAVIGATION WITH FIDUCIAL PLACEMENT;  Surgeon: Garner Nash, DO;  Location: [place];  Service: Pulmonary;  Laterality: Right;   Social History   Occupational History   Not on file  Tobacco Use   Smoking status: Former    Packs/day: 1.00    Years: 30.00    Pack years: 30.00    Types: Cigarettes    Quit date: 2005    Years since quitting: 18.0   Smokeless tobacco: Never  Vaping Use   Vaping Use: Never used  Substance and Sexual Activity   Alcohol use: No   Drug use: No   Sexual activity: Not Currently     Partners: Male    Birth control/protection: Post-menopausal

## 2021-11-24 ENCOUNTER — Other Ambulatory Visit: Payer: Self-pay

## 2021-11-24 ENCOUNTER — Ambulatory Visit: Payer: Medicare Other | Admitting: Physical Therapy

## 2021-11-24 ENCOUNTER — Other Ambulatory Visit: Payer: Self-pay | Admitting: Obstetrics & Gynecology

## 2021-11-24 DIAGNOSIS — M25571 Pain in right ankle and joints of right foot: Secondary | ICD-10-CM

## 2021-11-24 DIAGNOSIS — M6281 Muscle weakness (generalized): Secondary | ICD-10-CM | POA: Diagnosis not present

## 2021-11-24 DIAGNOSIS — M25512 Pain in left shoulder: Secondary | ICD-10-CM | POA: Diagnosis not present

## 2021-11-24 DIAGNOSIS — G8929 Other chronic pain: Secondary | ICD-10-CM | POA: Diagnosis not present

## 2021-11-24 DIAGNOSIS — M5441 Lumbago with sciatica, right side: Secondary | ICD-10-CM | POA: Diagnosis not present

## 2021-11-24 DIAGNOSIS — R262 Difficulty in walking, not elsewhere classified: Secondary | ICD-10-CM

## 2021-11-24 NOTE — Therapy (Signed)
[facility] [address] [place], [place], [address] Phone: [phone]   Fax:  [phone]  Physical Therapy Treatment/Recert  Patient Details  Name: Julie Mann MRN: [id number] Date of Birth: [birth year] Referring Provider (PT): Newt Minion, MD   Encounter Date: 11/24/2021   PT End of Session - 11/24/21 1226     Visit Number 3    Number of Visits 20    Date for PT Re-Evaluation 01/25/22    Authorization Type UHC Medicare $20 copay    Progress Note Due on Visit 10    PT Start Time 1100    PT Stop Time 5361    PT Time Calculation (min) 45 min    Activity Tolerance Patient tolerated treatment well    Behavior During Therapy [facility] for tasks assessed/performed             Past Medical History:  Diagnosis Date   Anal cancer ([place]) 2005   SCCa anus-stage II chemo, radiation   Anxiety    Arthritis    "hands, fingers, back" (02/27/2014)   Atrial fibrillation ([place])    Cat scratch of right hand 02/24/2014   COPD (chronic obstructive pulmonary disease) ([place])    Dyspnea    walking up hill;.  stairs (If Iam tired)   Dysrhythmia    PAF   Fall from horse 01/2018   Fracture, ankle 02/2019   Right    High cholesterol    History of stomach ulcers ~ 1966   Osteopenia     Past Surgical History:  Procedure Laterality Date   ANUS SURGERY  2005   "biopsy"   BREAST CYST EXCISION Right 1966   [place]  1980's   S/P miscarriage   EXTERNAL FIXATION LEG Right 03/10/2019   Procedure: OPEN REDUCTION INTERNAL FIXATION RIGHT ANKLE;  Surgeon: Marchia Bond, MD;  Location: [place];  Service: Orthopedics;  Laterality: Right;   HARDWARE REMOVAL Right 02/05/2021   Procedure: REMOVAL OF HARDWARE RIGHT ANKLE;  Surgeon: Newt Minion, MD;  Location: [place];  Service: Orthopedics;  Laterality: Right;   VARICOSE VEIN SURGERY Left    left leg   VIDEO BRONCHOSCOPY WITH ENDOBRONCHIAL NAVIGATION Right 08/25/2020   Procedure: VIDEO  BRONCHOSCOPY WITH ENDOBRONCHIAL NAVIGATION WITH FIDUCIAL PLACEMENT;  Surgeon: Garner Nash, DO;  Location: [place];  Service: Pulmonary;  Laterality: Right;    There were no vitals filed for this visit.   Subjective Assessment - 11/24/21 1216     Subjective She relays she will get injection in her back soon. She says her Lt shoulder is really bothering her now and she would like PT for this. I explained that we need PT refferal for this and I will assess her shoulder today and add this into her PT plan of care and send new recert to MD to sign.    Pertinent History status post hardware removal of the fibula from a trimalleolar fracture hardware removal on 02/05/2021, COPD, high cholesterol, osteopenia.  Pt. stated having back pain without leg pain at times.  Worse at night.   Pt. indicated possibly having times of leg pain without back pain.    Limitations Standing;Walking;House hold activities;Lifting;Other (comment)   lying down   Diagnostic tests xray in chart    Patient Stated Goals Reduce pain    Pain Onset More than a month ago                [facility] PT Assessment - 11/24/21  0001       Assessment   Medical Diagnosis Rt ankle s/p hardware removal 02/05/2021, Sciatica, chronic Lt shoulder pain.    Referring Provider (PT) Newt Minion, MD      AROM   Left Shoulder Flexion 130 Degrees   Rt side 135   Left Shoulder ABduction 120 Degrees   Rt side 130   Left Shoulder Internal Rotation 40 Degrees    Left Shoulder External Rotation 50 Degrees    Lumbar Flexion WFL    Lumbar Extension 50% mild complaints end range.    Lumbar - Right Side Bend to lateral knee joint, no complaints    Lumbar - Left Side Bend to lateral epicondyle with Rt lumbar pulling      Strength   Overall Strength Comments Lt shoulder flexion 4/5, abd 4+, ER 4, IR 5, elbow felxion and extension 5                           [facility] Adult PT Treatment/Exercise - 11/24/21 0001       Exercises    Exercises Shoulder      Lumbar Exercises: Stretches   Piriformis Stretch Right;3 reps;30 seconds    Piriformis Stretch Limitations seated with Rt knee to opposite shoulder    Other Lumbar Stretch Exercise Rt sciatic nerve glides 10 reps ea hold each 3 sec DF/PF    Other Lumbar Stretch Exercise prone lumbar extension propped on wedge under chest discontinued after 2:40 seconds due to complaints of neck pain. We then moved to standing repeated lumbar extensions 10 X holding 5 sec      Shoulder Exercises: Standing   External Rotation Both;15 reps    Theraband Level (Shoulder External Rotation) Level 2 (Red)    Flexion Left;10 reps    Theraband Level (Shoulder Flexion) Level 2 (Red)    Flexion Limitations with bend in elbow to encorporate more bicep into this      Shoulder Exercises: Stretch   Internal Rotation Stretch Limitations modified sleeper stretch at wall 10 sec X5    Other Shoulder Stretches doorway stretch for Lt ER 10 sec X5                       PT Short Term Goals - 11/16/21 2355       PT SHORT TERM GOAL #1   Title Patient will demonstrate independent use of home exercise program to maintain progress from in clinic treatments.    Time 3    Period Weeks    Status New    Target Date 12/07/21               PT Long Term Goals - 11/24/21 1308       PT LONG TERM GOAL #1   Title Patient will demonstrate/report pain at worst less than or equal to 2/10 to facilitate minimal limitation in daily activity secondary to pain symptoms.    Time 10    Period Weeks    Status On-going    Target Date 01/25/22      PT LONG TERM GOAL #2   Title Patient will demonstrate independent use of home exercise program to facilitate ability to maintain/progress functional gains from skilled physical therapy services.    Baseline modified today    Time 10    Period Weeks    Status On-going    Target Date 01/25/22      PT LONG  TERM GOAL #3   Title Pt. will demonstrate  lumbar ext AROM > or = 100 % to facilitate upright standing posture at PLOF.    Time 10    Period Weeks    Status New    Target Date 01/25/22      PT LONG TERM GOAL #4   Title Pt. will demonstrate Rt leg slump testing negative, equal to Lt s symptoms for normalized mobility.    Time 10    Period Weeks    Status On-going    Target Date 01/25/22      PT LONG TERM GOAL #5   Title She will improve Lt shoulder ROM to [facility]    Baseline mild to mod limitations    Time 8    Period Weeks    Status New    Target Date 01/25/22      Additional Long Term Goals   Additional Long Term Goals Yes      PT LONG TERM GOAL #6   Title She will improve Lt shoulder strength to 5/5 MMT all planes for functional reaching and lifting    Time 8    Period Weeks    Status New    Target Date 01/25/22                   Plan - 11/24/21 1140     Clinical Impression Statement Recert today as she has now chief complaint of Lt shoulder pain. PT will plan to add this into plan of care and will send new PT recert off to MD to sign. Her left shoulder pain is anterior around her bicep tendon consistent with tendonitis and she reports has been worse since she got acupuncture from acupunturist. On assessment she does have decreased ROM and strength in Lt shoulder compared to Rt and skilled PT is necessary to address these deficits to improve overall shoulder and pain in her left shoulder. We will also continue to work to improve her Rt lumbar radiculopathy and she responded well to the new exercises for this. I updated her HEP to condense into the most importance lumbar and shoulder exercises to focus on and she showed good understanding of this.    Personal Factors and Comorbidities Comorbidity 3+    Comorbidities COPD, high cholesterol, osteopenia    Examination-Activity Limitations Sleep;Bed Mobility;Sit;Bend;Stand;Locomotion Level    Examination-Participation Restrictions Community Activity;Shop;Yard  Work;Occupation    Stability/Clinical Decision Making Stable/Uncomplicated    Rehab Potential Good    PT Frequency Other (comment)   1-2x/week   PT Duration Other (comment)   10 weeks   PT Treatment/Interventions ADLs/Self Care Home Management;Electrical Stimulation;Cryotherapy;Iontophoresis 4mg /ml Dexamethasone;Moist Heat;Balance training;Therapeutic exercise;Therapeutic activities;Stair training;Functional mobility training;Gait training;Ultrasound;DME Instruction;Patient/family education;Passive range of motion;Dry needling;Joint Manipulations;Vasopneumatic Device;Taping;Manual techniques    PT Next Visit Plan check to see if recert was signed to add shoulder or check for new referral for this.    PT Home Exercise Plan Access Code: [email or web address]  URL: [email or web address]  Date: 11/24/2021  Prepared by: Elsie Ra    Exercises  Standing Lumbar Extension with Counter - 2 x daily - 6 x weekly - 1-2 sets - 10 reps - 5 sec hold  Seated Piriformis Stretch - 2 x daily - 6 x weekly - 1 sets - 3 reps - 30 sec hold  shoulder ER stretch in doorway - 2 x daily - 6 x weekly - 1 sets - 5 reps - 10 hold  Standing Sleeper Stretch at [place]  x daily - 6 x weekly - 1 sets - 5 reps - 10 sec hold  Shoulder External Rotation and Scapular Retraction with Resistance - 2 x daily - 6 x weekly - 2 sets - 10 reps  Standing Shoulder Flexion with Resistance - 2 x daily - 6 x weekly - 1-2 sets - 10 reps    Consulted and Agree with Plan of Care Patient             Patient will benefit from skilled therapeutic intervention in order to improve the following deficits and impairments:  Abnormal gait, Hypomobility, Pain, Decreased strength, Decreased activity tolerance, Increased fascial restricitons, Difficulty walking, Decreased mobility, Decreased balance, Decreased range of motion, Impaired perceived functional ability, Improper body mechanics, Impaired flexibility, Decreased coordination  Visit  Diagnosis: Chronic right-sided low back pain with right-sided sciatica  Pain in right ankle and joints of right foot  Difficulty in walking, not elsewhere classified  Chronic left shoulder pain  Muscle weakness (generalized)     Problem List Patient Active Problem List   Diagnosis Date Noted   PVD (posterior vitreous detachment) 07/27/2021   Pseudophakia of right eye 07/27/2021   Pseudophakia of left eye 07/27/2021   Vitreomacular adhesion of right eye 07/27/2021   Macular pucker, right eye 83/07/4075   Hardware complicating wound infection ([place])    Neck pain 10/05/2020   Nodule of upper lobe of right lung 08/17/2020   Biceps tendonosis of left shoulder 02/13/2020   Impingement syndrome of left shoulder 02/13/2020   Post-operative state 02/13/2020   Ankle dislocation, right, initial encounter 03/10/2019   Trimalleolar fracture of ankle, closed, right, initial encounter    Chronic left shoulder pain 02/19/2019   Anxiety 12/17/2016   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 [place], [place], [address] Phone: [phone]   Fax:  [phone]  Name: Julie Mann MRN: [id number] Date of Birth: Oct 30, 1946

## 2021-11-25 ENCOUNTER — Other Ambulatory Visit (HOSPITAL_COMMUNITY): Payer: Self-pay

## 2021-11-25 ENCOUNTER — Other Ambulatory Visit: Payer: Self-pay | Admitting: Obstetrics & Gynecology

## 2021-11-25 ENCOUNTER — Ambulatory Visit: Payer: Medicare Other | Admitting: Physical Therapy

## 2021-11-25 ENCOUNTER — Encounter: Payer: Self-pay | Admitting: Physical Therapy

## 2021-11-25 DIAGNOSIS — F419 Anxiety disorder, unspecified: Secondary | ICD-10-CM

## 2021-11-25 DIAGNOSIS — M5441 Lumbago with sciatica, right side: Secondary | ICD-10-CM | POA: Diagnosis not present

## 2021-11-25 DIAGNOSIS — G8929 Other chronic pain: Secondary | ICD-10-CM | POA: Diagnosis not present

## 2021-11-25 DIAGNOSIS — M25571 Pain in right ankle and joints of right foot: Secondary | ICD-10-CM

## 2021-11-25 DIAGNOSIS — R262 Difficulty in walking, not elsewhere classified: Secondary | ICD-10-CM | POA: Diagnosis not present

## 2021-11-25 DIAGNOSIS — M25512 Pain in left shoulder: Secondary | ICD-10-CM

## 2021-11-25 DIAGNOSIS — M6281 Muscle weakness (generalized): Secondary | ICD-10-CM

## 2021-11-25 MED ORDER — IBUPROFEN 600 MG PO TABS
600.0000 mg | ORAL_TABLET | Freq: Every day | ORAL | 0 refills | Status: DC | PRN
Start: 2021-11-25 — End: 2022-01-27
  Filled 2021-11-25: qty 30, 30d supply, fill #0

## 2021-11-25 MED ORDER — LORAZEPAM 1 MG PO TABS
1.0000 mg | ORAL_TABLET | Freq: Every evening | ORAL | 1 refills | Status: DC | PRN
  Filled 2021-11-25: qty 30, 30d supply, fill #0
  Filled 2022-01-12: qty 30, 30d supply, fill #1

## 2021-11-25 NOTE — Therapy (Signed)
[facility] [address] [place], [place], [address] Phone: [phone]   Fax:  [phone]  Physical Therapy Treatment  Patient Details  Name: Julie Mann MRN: [id number] Date of Birth: 11/27/1945 Referring Provider (PT): Newt Minion, MD   Encounter Date: 11/25/2021   PT End of Session - 11/25/21 1008     Visit Number 4    Number of Visits 20    Date for PT Re-Evaluation 01/25/22    Authorization Type UHC Medicare $20 copay    Progress Note Due on Visit 10    PT Start Time 0930    PT Stop Time 1008    PT Time Calculation (min) 38 min    Activity Tolerance Patient tolerated treatment well    Behavior During Therapy [facility] for tasks assessed/performed             Past Medical History:  Diagnosis Date   Anal cancer ([place]) 2005   SCCa anus-stage II chemo, radiation   Anxiety    Arthritis    "hands, fingers, back" (02/27/2014)   Atrial fibrillation ([place])    Cat scratch of right hand 02/24/2014   COPD (chronic obstructive pulmonary disease) ([place])    Dyspnea    walking up hill;.  stairs (If Iam tired)   Dysrhythmia    PAF   Fall from horse 01/2018   Fracture, ankle 02/2019   Right    High cholesterol    History of stomach ulcers ~ 1966   Osteopenia     Past Surgical History:  Procedure Laterality Date   ANUS SURGERY  2005   "biopsy"   BREAST CYST EXCISION Right 1966   [place]  1980's   S/P miscarriage   EXTERNAL FIXATION LEG Right 03/10/2019   Procedure: OPEN REDUCTION INTERNAL FIXATION RIGHT ANKLE;  Surgeon: Marchia Bond, MD;  Location: [place];  Service: Orthopedics;  Laterality: Right;   HARDWARE REMOVAL Right 02/05/2021   Procedure: REMOVAL OF HARDWARE RIGHT ANKLE;  Surgeon: Newt Minion, MD;  Location: [place];  Service: Orthopedics;  Laterality: Right;   VARICOSE VEIN SURGERY Left    left leg   VIDEO BRONCHOSCOPY WITH ENDOBRONCHIAL NAVIGATION Right 08/25/2020   Procedure: VIDEO BRONCHOSCOPY  WITH ENDOBRONCHIAL NAVIGATION WITH FIDUCIAL PLACEMENT;  Surgeon: Garner Nash, DO;  Location: [place];  Service: Pulmonary;  Laterality: Right;    There were no vitals filed for this visit.   Subjective Assessment - 11/25/21 0930     Subjective reports Rt leg and back is not bothering her today (yet).  not convinced her pain is getting much better.    Pertinent History status post hardware removal of the fibula from a trimalleolar fracture hardware removal on 02/05/2021, COPD, high cholesterol, osteopenia.  Pt. stated having back pain without leg pain at times.  Worse at night.   Pt. indicated possibly having times of leg pain without back pain.    Limitations Standing;Walking;House hold activities;Lifting;Other (comment)   lying down   Diagnostic tests xray in chart    Patient Stated Goals Reduce pain    Currently in Pain? No/denies    Pain Onset --                               [facility] Adult PT Treatment/Exercise - 11/25/21 0933       Lumbar Exercises: Stretches   Standing Extension 10 reps;10 seconds    Standing  Extension Limitations arms on wall    Gastroc Stretch 3 reps;30 seconds    Gastroc Stretch Limitations both feet on slantboard    Other Lumbar Stretch Exercise Rt lateral shift correction 10 x 10 sec      Lumbar Exercises: Aerobic   Nustep UE/LE L6 x 8 min      Lumbar Exercises: Standing   Row Both;20 reps;Theraband    Theraband Level (Row) Level 3 (Green)    Shoulder Extension Both;20 reps;Theraband    Theraband Level (Shoulder Extension) Level 2 (Red)    Other Standing Lumbar Exercises bil shoulder ER with L2 band x 20 reps - cues for core engagement      Shoulder Exercises: Standing   Flexion Left;20 reps    Theraband Level (Shoulder Flexion) Level 2 (Red)    Flexion Limitations with bend in elbow to encorporate more bicep into this    Other Standing Exercises horizontal adduction (from full abduction to neutral) with L2 band 2x10       Shoulder Exercises: Stretch   Other Shoulder Stretches mid doorway stretch 3x30 sec                       PT Short Term Goals - 11/16/21 0933       PT SHORT TERM GOAL #1   Title Patient will demonstrate independent use of home exercise program to maintain progress from in clinic treatments.    Time 3    Period Weeks    Status New    Target Date 12/07/21               PT Long Term Goals - 11/24/21 1308       PT LONG TERM GOAL #1   Title Patient will demonstrate/report pain at worst less than or equal to 2/10 to facilitate minimal limitation in daily activity secondary to pain symptoms.    Time 10    Period Weeks    Status On-going    Target Date 01/25/22      PT LONG TERM GOAL #2   Title Patient will demonstrate independent use of home exercise program to facilitate ability to maintain/progress functional gains from skilled physical therapy services.    Baseline modified today    Time 10    Period Weeks    Status On-going    Target Date 01/25/22      PT LONG TERM GOAL #3   Title Pt. will demonstrate lumbar ext AROM > or = 100 % to facilitate upright standing posture at PLOF.    Time 10    Period Weeks    Status New    Target Date 01/25/22      PT LONG TERM GOAL #4   Title Pt. will demonstrate Rt leg slump testing negative, equal to Lt s symptoms for normalized mobility.    Time 10    Period Weeks    Status On-going    Target Date 01/25/22      PT LONG TERM GOAL #5   Title She will improve Lt shoulder ROM to [facility]    Baseline mild to mod limitations    Time 8    Period Weeks    Status New    Target Date 01/25/22      Additional Long Term Goals   Additional Long Term Goals Yes      PT LONG TERM GOAL #6   Title She will improve Lt shoulder strength to 5/5 MMT all planes  for functional reaching and lifting    Time 8    Period Weeks    Status New    Target Date 01/25/22                   Plan - 11/25/21 1008     Clinical  Impression Statement Pt tolerated session well today without increase in pain.  Overall still having some pain in RLE as well as Lt shoulder impacting functional ability.  Will benefit from PT to maximize function.    Personal Factors and Comorbidities Comorbidity 3+    Comorbidities COPD, high cholesterol, osteopenia    Examination-Activity Limitations Sleep;Bed Mobility;Sit;Bend;Stand;Locomotion Level    Examination-Participation Restrictions Community Activity;Shop;Yard Work;Occupation    Stability/Clinical Decision Making Stable/Uncomplicated    Rehab Potential Good    PT Frequency Other (comment)   1-2x/week   PT Duration Other (comment)   10 weeks   PT Treatment/Interventions ADLs/Self Care Home Management;Electrical Stimulation;Cryotherapy;Iontophoresis 4mg /ml Dexamethasone;Moist Heat;Balance training;Therapeutic exercise;Therapeutic activities;Stair training;Functional mobility training;Gait training;Ultrasound;DME Instruction;Patient/family education;Passive range of motion;Dry needling;Joint Manipulations;Vasopneumatic Device;Taping;Manual techniques    PT Next Visit Plan continue with hip/core stability, shoulder stability with core activation    PT Home Exercise Plan Access Code: [email or web address]  URL: [email or web address]  Date: 11/24/2021  Prepared by: Elsie Ra    Exercises  Standing Lumbar Extension with Counter - 2 x daily - 6 x weekly - 1-2 sets - 10 reps - 5 sec hold  Seated Piriformis Stretch - 2 x daily - 6 x weekly - 1 sets - 3 reps - 30 sec hold  shoulder ER stretch in doorway - 2 x daily - 6 x weekly - 1 sets - 5 reps - 10 hold  Standing Sleeper Stretch at [place] - 2 x daily - 6 x weekly - 1 sets - 5 reps - 10 sec hold  Shoulder External Rotation and Scapular Retraction with Resistance - 2 x daily - 6 x weekly - 2 sets - 10 reps  Standing Shoulder Flexion with Resistance - 2 x daily - 6 x weekly - 1-2 sets - 10 reps    Consulted and Agree with Plan of Care Patient              Patient will benefit from skilled therapeutic intervention in order to improve the following deficits and impairments:  Abnormal gait, Hypomobility, Pain, Decreased strength, Decreased activity tolerance, Increased fascial restricitons, Difficulty walking, Decreased mobility, Decreased balance, Decreased range of motion, Impaired perceived functional ability, Improper body mechanics, Impaired flexibility, Decreased coordination  Visit Diagnosis: Chronic right-sided low back pain with right-sided sciatica  Pain in right ankle and joints of right foot  Difficulty in walking, not elsewhere classified  Chronic left shoulder pain  Muscle weakness (generalized)     Problem List Patient Active Problem List   Diagnosis Date Noted   PVD (posterior vitreous detachment) 07/27/2021   Pseudophakia of right eye 07/27/2021   Pseudophakia of left eye 07/27/2021   Vitreomacular adhesion of right eye 07/27/2021   Macular pucker, right eye 30/16/0109   Hardware complicating wound infection ([place])    Neck pain 10/05/2020   Nodule of upper lobe of right lung 08/17/2020   Biceps tendonosis of left shoulder 02/13/2020   Impingement syndrome of left shoulder 02/13/2020   Post-operative state 02/13/2020   Ankle dislocation, right, initial encounter 03/10/2019   Trimalleolar fracture of ankle, closed, right, initial encounter    Chronic left shoulder pain 02/19/2019   Anxiety 12/17/2016   Major depression  in remission ([place]) 12/17/2016   Osteopenia 12/17/2016   Paroxysmal atrial fibrillation ([place]) 12/17/2016   Pure hypercholesterolemia 12/17/2016   Rectal cancer ([place]) 07/30/2015      Laureen Abrahams, PT, DPT 11/25/21 10:11 AM    [facility] Physical Therapy [address] [place], [place], [address] Phone: [phone]   Fax:  [phone]  Name: Julie Mann MRN: [id number] Date of Birth: March 12, 1946

## 2021-11-30 ENCOUNTER — Encounter: Payer: Medicare Other | Admitting: Rehabilitative and Restorative Service Providers"

## 2021-11-30 ENCOUNTER — Ambulatory Visit
Admission: RE | Admit: 2021-11-30 | Discharge: 2021-11-30 | Disposition: A | Discharge status: Home / Self Care | Payer: Medicare Other | Source: Ambulatory Visit | Attending: Orthopedic Surgery | Admitting: Orthopedic Surgery

## 2021-11-30 ENCOUNTER — Other Ambulatory Visit: Payer: Self-pay

## 2021-11-30 ENCOUNTER — Ambulatory Visit: Payer: Medicare Other | Admitting: Physical Therapy

## 2021-11-30 ENCOUNTER — Encounter: Payer: Self-pay | Admitting: Physical Therapy

## 2021-11-30 DIAGNOSIS — M25512 Pain in left shoulder: Secondary | ICD-10-CM

## 2021-11-30 DIAGNOSIS — M6281 Muscle weakness (generalized): Secondary | ICD-10-CM | POA: Diagnosis not present

## 2021-11-30 DIAGNOSIS — M545 Low back pain, unspecified: Secondary | ICD-10-CM | POA: Diagnosis not present

## 2021-11-30 DIAGNOSIS — G8929 Other chronic pain: Secondary | ICD-10-CM

## 2021-11-30 DIAGNOSIS — R262 Difficulty in walking, not elsewhere classified: Secondary | ICD-10-CM

## 2021-11-30 DIAGNOSIS — M5441 Lumbago with sciatica, right side: Secondary | ICD-10-CM | POA: Diagnosis not present

## 2021-11-30 DIAGNOSIS — M25571 Pain in right ankle and joints of right foot: Secondary | ICD-10-CM

## 2021-11-30 NOTE — Therapy (Signed)
[facility] [address] [place], [place], [address] Phone: [phone]   Fax:  [phone]  Physical Therapy Treatment  Patient Details  Name: Julie Mann MRN: [id number] Date of Birth: 1946/04/12 Referring Provider (PT): Newt Minion, MD   Encounter Date: 11/30/2021   PT End of Session - 11/30/21 1006     Visit Number 5    Number of Visits 20    Date for PT Re-Evaluation 01/25/22    Authorization Type UHC Medicare $20 copay    Progress Note Due on Visit 10    PT Start Time 0928    PT Stop Time 1009    PT Time Calculation (min) 41 min    Activity Tolerance Patient tolerated treatment well    Behavior During Therapy [facility] for tasks assessed/performed             Past Medical History:  Diagnosis Date   Anal cancer ([place]) 2005   SCCa anus-stage II chemo, radiation   Anxiety    Arthritis    "hands, fingers, back" (02/27/2014)   Atrial fibrillation ([place])    Cat scratch of right hand 02/24/2014   COPD (chronic obstructive pulmonary disease) ([place])    Dyspnea    walking up hill;.  stairs (If Iam tired)   Dysrhythmia    PAF   Fall from horse 01/2018   Fracture, ankle 02/2019   Right    High cholesterol    History of stomach ulcers ~ 1966   Osteopenia     Past Surgical History:  Procedure Laterality Date   ANUS SURGERY  2005   "biopsy"   BREAST CYST EXCISION Right 1966   [place]  1980's   S/P miscarriage   EXTERNAL FIXATION LEG Right 03/10/2019   Procedure: OPEN REDUCTION INTERNAL FIXATION RIGHT ANKLE;  Surgeon: Marchia Bond, MD;  Location: [place];  Service: Orthopedics;  Laterality: Right;   HARDWARE REMOVAL Right 02/05/2021   Procedure: REMOVAL OF HARDWARE RIGHT ANKLE;  Surgeon: Newt Minion, MD;  Location: [place];  Service: Orthopedics;  Laterality: Right;   VARICOSE VEIN SURGERY Left    left leg   VIDEO BRONCHOSCOPY WITH ENDOBRONCHIAL NAVIGATION Right 08/25/2020   Procedure: VIDEO BRONCHOSCOPY  WITH ENDOBRONCHIAL NAVIGATION WITH FIDUCIAL PLACEMENT;  Surgeon: Garner Nash, DO;  Location: [place];  Service: Pulmonary;  Laterality: Right;    There were no vitals filed for this visit.   Subjective Assessment - 11/30/21 0926     Subjective doing pretty well, feels back is "a little better."  Lt shoulder "is killing me"    Pertinent History status post hardware removal of the fibula from a trimalleolar fracture hardware removal on 02/05/2021, COPD, high cholesterol, osteopenia.  Pt. stated having back pain without leg pain at times.  Worse at night.   Pt. indicated possibly having times of leg pain without back pain.    Limitations Standing;Walking;House hold activities;Lifting;Other (comment)   lying down   Diagnostic tests xray in chart    Patient Stated Goals Reduce pain    Pain Score 4     Pain Location Back    Pain Orientation Right    Pain Descriptors / Indicators Aching;Throbbing    Pain Type Chronic pain    Pain Onset More than a month ago    Pain Frequency Constant    Multiple Pain Sites Yes    Pain Score 4    Pain Location Shoulder    Pain Orientation Left  Pain Descriptors / Indicators Sore;Shooting    Pain Type Acute pain;Chronic pain    Pain Onset More than a month ago    Pain Frequency Constant    Aggravating Factors  horizontal adduction    Pain Relieving Factors rest                               [facility] Adult PT Treatment/Exercise - 11/30/21 0935       Lumbar Exercises: Standing   Other Standing Lumbar Exercises in wall pushup position: alternating hip extension 2x10      Lumbar Exercises: Supine   Other Supine Lumbar Exercises in tabletop and bil shouler flexion at 90 deg with 1# DB in hands: 2x10 heel taps to mat bil, mini crunch with opposite UE reach to shin x7 bil (unable to complete more)    Other Supine Lumbar Exercises mini crunch 2x10      Shoulder Exercises: ROM/Strengthening   UBE (Upper Arm Bike) UE/LE x5 min; L4                        PT Short Term Goals - 11/30/21 1006       PT SHORT TERM GOAL #1   Title Patient will demonstrate independent use of home exercise program to maintain progress from in clinic treatments.    Baseline 1/17: has questions, but PT unsure as described exercise isn't on current HEP, advised pt to bring next visit to review; met current HEP to date for this POC    Time 3    Period Weeks    Status Partially Met    Target Date 12/07/21               PT Long Term Goals - 11/24/21 1308       PT LONG TERM GOAL #1   Title Patient will demonstrate/report pain at worst less than or equal to 2/10 to facilitate minimal limitation in daily activity secondary to pain symptoms.    Time 10    Period Weeks    Status On-going    Target Date 01/25/22      PT LONG TERM GOAL #2   Title Patient will demonstrate independent use of home exercise program to facilitate ability to maintain/progress functional gains from skilled physical therapy services.    Baseline modified today    Time 10    Period Weeks    Status On-going    Target Date 01/25/22      PT LONG TERM GOAL #3   Title Pt. will demonstrate lumbar ext AROM > or = 100 % to facilitate upright standing posture at PLOF.    Time 10    Period Weeks    Status New    Target Date 01/25/22      PT LONG TERM GOAL #4   Title Pt. will demonstrate Rt leg slump testing negative, equal to Lt s symptoms for normalized mobility.    Time 10    Period Weeks    Status On-going    Target Date 01/25/22      PT LONG TERM GOAL #5   Title She will improve Lt shoulder ROM to [facility]    Baseline mild to mod limitations    Time 8    Period Weeks    Status New    Target Date 01/25/22      Additional Long Term Goals   Additional Long  Term Goals Yes      PT LONG TERM GOAL #6   Title She will improve Lt shoulder strength to 5/5 MMT all planes for functional reaching and lifting    Time 8    Period Weeks    Status New     Target Date 01/25/22                   Plan - 11/30/21 1007     Clinical Impression Statement Session today focused on core stabilization exercises with shoulder stabilization exercises.  Tolerated well without increase in pain.  Will continue to benefit from PT to maximize function.    Personal Factors and Comorbidities Comorbidity 3+    Comorbidities COPD, high cholesterol, osteopenia    Examination-Activity Limitations Sleep;Bed Mobility;Sit;Bend;Stand;Locomotion Level    Examination-Participation Restrictions Community Activity;Shop;Yard Work;Occupation    Stability/Clinical Decision Making Stable/Uncomplicated    Rehab Potential Good    PT Frequency Other (comment)   1-2x/week   PT Duration Other (comment)   10 weeks   PT Treatment/Interventions ADLs/Self Care Home Management;Electrical Stimulation;Cryotherapy;Iontophoresis 4mg /ml Dexamethasone;Moist Heat;Balance training;Therapeutic exercise;Therapeutic activities;Stair training;Functional mobility training;Gait training;Ultrasound;DME Instruction;Patient/family education;Passive range of motion;Dry needling;Joint Manipulations;Vasopneumatic Device;Taping;Manual techniques    PT Next Visit Plan continue with hip/core stability, shoulder stability with core activation - review HEP with pt as she had questions    PT Home Exercise Plan Access Code: [email or web address]  URL: [email or web address]  Date: 11/24/2021  Prepared by: Elsie Ra    Exercises  Standing Lumbar Extension with Counter - 2 x daily - 6 x weekly - 1-2 sets - 10 reps - 5 sec hold  Seated Piriformis Stretch - 2 x daily - 6 x weekly - 1 sets - 3 reps - 30 sec hold  shoulder ER stretch in doorway - 2 x daily - 6 x weekly - 1 sets - 5 reps - 10 hold  Standing Sleeper Stretch at [place] - 2 x daily - 6 x weekly - 1 sets - 5 reps - 10 sec hold  Shoulder External Rotation and Scapular Retraction with Resistance - 2 x daily - 6 x weekly - 2 sets - 10 reps  Standing Shoulder  Flexion with Resistance - 2 x daily - 6 x weekly - 1-2 sets - 10 reps    Consulted and Agree with Plan of Care Patient             Patient will benefit from skilled therapeutic intervention in order to improve the following deficits and impairments:  Abnormal gait, Hypomobility, Pain, Decreased strength, Decreased activity tolerance, Increased fascial restricitons, Difficulty walking, Decreased mobility, Decreased balance, Decreased range of motion, Impaired perceived functional ability, Improper body mechanics, Impaired flexibility, Decreased coordination  Visit Diagnosis: Chronic right-sided low back pain with right-sided sciatica  Chronic left shoulder pain  Muscle weakness (generalized)  Pain in right ankle and joints of right foot  Difficulty in walking, not elsewhere classified     Problem List Patient Active Problem List   Diagnosis Date Noted   PVD (posterior vitreous detachment) 07/27/2021   Pseudophakia of right eye 07/27/2021   Pseudophakia of left eye 07/27/2021   Vitreomacular adhesion of right eye 07/27/2021   Macular pucker, right eye 27/01/5008   Hardware complicating wound infection ([place])    Neck pain 10/05/2020   Nodule of upper lobe of right lung 08/17/2020   Biceps tendonosis of left shoulder 02/13/2020   Impingement syndrome of left shoulder 02/13/2020   Post-operative state 02/13/2020   Ankle  dislocation, right, initial encounter 03/10/2019   Trimalleolar fracture of ankle, closed, right, initial encounter    Chronic left shoulder pain 02/19/2019   Anxiety 12/17/2016   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[place]) 12/17/2016   Osteopenia 12/17/2016   Paroxysmal atrial fibrillation ([place]) 12/17/2016   Pure hypercholesterolemia 12/17/2016   Rectal cancer ([place]) 07/30/2015       Laureen Abrahams, PT, DPT 11/30/21 10:12 AM     [facility] [address] [place], [place], [address] Phone: [phone]   Fax:   [phone]  Name: Julie Mann MRN: [id number] Date of Birth: 1946-03-05

## 2021-11-30 NOTE — Patient Instructions (Signed)
Access Code: [email or web address] URL: [email or web address] Date: 11/30/2021 Prepared by: Faustino Congress  Exercises Standing Lumbar Extension with Counter - 2 x daily - 7 x weekly - 1-2 sets - 5-10 reps Supine Lower Trunk Rotation - 2 x daily - 7 x weekly - 1 sets - 5 reps - 15 hold Supine Piriformis Stretch Pulling Heel to Hip (Mirrored) - 2 x daily - 7 x weekly - 1 sets - 5 reps - 15-30 hold Supine 90/90 Sciatic Nerve Glide with Knee Flexion/Extension - 2 x daily - 7 x weekly - 1-2 sets - 10 reps Standing Plank on Wall - 1 x daily - 7 x weekly - 3 sets - 10 reps Supine 90/90 Alternating Heel Touches with Posterior Pelvic Tilt - 1 x daily - 7 x weekly - 3 sets - 10 reps Diagonal Curl Up with Reach - 1 x daily - 7 x weekly - 3 sets - 10 reps

## 2021-12-02 ENCOUNTER — Encounter: Payer: Medicare Other | Admitting: Rehabilitative and Restorative Service Providers"

## 2021-12-03 ENCOUNTER — Ambulatory Visit: Payer: Medicare Other | Admitting: Physical Therapy

## 2021-12-03 ENCOUNTER — Encounter: Payer: Self-pay | Admitting: Physical Therapy

## 2021-12-03 ENCOUNTER — Other Ambulatory Visit: Payer: Self-pay

## 2021-12-03 DIAGNOSIS — M6281 Muscle weakness (generalized): Secondary | ICD-10-CM | POA: Diagnosis not present

## 2021-12-03 DIAGNOSIS — M25512 Pain in left shoulder: Secondary | ICD-10-CM

## 2021-12-03 DIAGNOSIS — M5441 Lumbago with sciatica, right side: Secondary | ICD-10-CM

## 2021-12-03 DIAGNOSIS — G8929 Other chronic pain: Secondary | ICD-10-CM | POA: Diagnosis not present

## 2021-12-03 NOTE — Therapy (Signed)
[facility] [address] [place], [place], [address] Phone: [phone]   Fax:  [phone]  Physical Therapy Treatment  Patient Details  Name: Julie Mann MRN: [id number] Date of Birth: 02/18/1946 Referring Provider (PT): Newt Minion, MD   Encounter Date: 12/03/2021   PT End of Session - 12/03/21 1006     Visit Number 6    Number of Visits 20    Date for PT Re-Evaluation 01/25/22    Authorization Type UHC Medicare $20 copay    Progress Note Due on Visit 10    PT Start Time 0928    PT Stop Time 1008    PT Time Calculation (min) 40 min    Activity Tolerance Patient tolerated treatment well    Behavior During Therapy [facility] for tasks assessed/performed             Past Medical History:  Diagnosis Date   Anal cancer ([place]) 2005   SCCa anus-stage II chemo, radiation   Anxiety    Arthritis    "hands, fingers, back" (02/27/2014)   Atrial fibrillation ([place])    Cat scratch of right hand 02/24/2014   COPD (chronic obstructive pulmonary disease) ([place])    Dyspnea    walking up hill;.  stairs (If Iam tired)   Dysrhythmia    PAF   Fall from horse 01/2018   Fracture, ankle 02/2019   Right    High cholesterol    History of stomach ulcers ~ 1966   Osteopenia     Past Surgical History:  Procedure Laterality Date   ANUS SURGERY  2005   "biopsy"   BREAST CYST EXCISION Right 1966   [place]  1980's   S/P miscarriage   EXTERNAL FIXATION LEG Right 03/10/2019   Procedure: OPEN REDUCTION INTERNAL FIXATION RIGHT ANKLE;  Surgeon: Marchia Bond, MD;  Location: [place];  Service: Orthopedics;  Laterality: Right;   HARDWARE REMOVAL Right 02/05/2021   Procedure: REMOVAL OF HARDWARE RIGHT ANKLE;  Surgeon: Newt Minion, MD;  Location: [place];  Service: Orthopedics;  Laterality: Right;   VARICOSE VEIN SURGERY Left    left leg   VIDEO BRONCHOSCOPY WITH ENDOBRONCHIAL NAVIGATION Right 08/25/2020   Procedure: VIDEO BRONCHOSCOPY  WITH ENDOBRONCHIAL NAVIGATION WITH FIDUCIAL PLACEMENT;  Surgeon: Garner Nash, DO;  Location: [place];  Service: Pulmonary;  Laterality: Right;    There were no vitals filed for this visit.   Subjective Assessment - 12/03/21 0927     Subjective called [facility] and got MRI same day.    Pertinent History status post hardware removal of the fibula from a trimalleolar fracture hardware removal on 02/05/2021, COPD, high cholesterol, osteopenia.  Pt. stated having back pain without leg pain at times.  Worse at night.   Pt. indicated possibly having times of leg pain without back pain.    Limitations Standing;Walking;House hold activities;Lifting;Other (comment)   lying down   Diagnostic tests xray in chart    Patient Stated Goals Reduce pain    Currently in Pain? No/denies    Pain Onset --    Pain Onset --                               [facility] Adult PT Treatment/Exercise - 12/03/21 4315       Lumbar Exercises: Standing   Other Standing Lumbar Exercises in wall pushup position: alternating hip extension 2x10  Lumbar Exercises: Seated   Other Seated Lumbar Exercises tall kneeling: shoulder flex bil 1# 2x10; abduction 1# bil 2x10, bicep curl to overhead press 1# 2x10; side bend with 2# each hand 2x10 bil      Lumbar Exercises: Supine   Other Supine Lumbar Exercises in tabletop and bil shouler flexion at 90 deg with 2# DB in hands: 2x10 heel taps to mat bil, mini crunch with opposite UE reach to shin x7 bil (unable to complete more)    Other Supine Lumbar Exercises mini crunch 2x10      Lumbar Exercises: Quadruped   [place] 20 reps      Shoulder Exercises: ROM/Strengthening   UBE (Upper Arm Bike) UE/LE x8 min; L4                       PT Short Term Goals - 12/03/21 1006       PT SHORT TERM GOAL #1   Title Patient will demonstrate independent use of home exercise program to maintain progress from in clinic treatments.    Time 3     Period Weeks    Status Achieved    Target Date 12/07/21               PT Long Term Goals - 11/24/21 1308       PT LONG TERM GOAL #1   Title Patient will demonstrate/report pain at worst less than or equal to 2/10 to facilitate minimal limitation in daily activity secondary to pain symptoms.    Time 10    Period Weeks    Status On-going    Target Date 01/25/22      PT LONG TERM GOAL #2   Title Patient will demonstrate independent use of home exercise program to facilitate ability to maintain/progress functional gains from skilled physical therapy services.    Baseline modified today    Time 10    Period Weeks    Status On-going    Target Date 01/25/22      PT LONG TERM GOAL #3   Title Pt. will demonstrate lumbar ext AROM > or = 100 % to facilitate upright standing posture at PLOF.    Time 10    Period Weeks    Status New    Target Date 01/25/22      PT LONG TERM GOAL #4   Title Pt. will demonstrate Rt leg slump testing negative, equal to Lt s symptoms for normalized mobility.    Time 10    Period Weeks    Status On-going    Target Date 01/25/22      PT LONG TERM GOAL #5   Title She will improve Lt shoulder ROM to [facility]    Baseline mild to mod limitations    Time 8    Period Weeks    Status New    Target Date 01/25/22      Additional Long Term Goals   Additional Long Term Goals Yes      PT LONG TERM GOAL #6   Title She will improve Lt shoulder strength to 5/5 MMT all planes for functional reaching and lifting    Time 8    Period Weeks    Status New    Target Date 01/25/22                   Plan - 12/03/21 1006     Clinical Impression Statement Pt has met STG #1 and is  progressing well with PT. Had MRI the other day so is hopeful for injection to be scheduled soone.  Will continue to benefit from PT to maximize function.    Personal Factors and Comorbidities Comorbidity 3+    Comorbidities COPD, high cholesterol, osteopenia     Examination-Activity Limitations Sleep;Bed Mobility;Sit;Bend;Stand;Locomotion Level    Examination-Participation Restrictions Community Activity;Shop;Yard Work;Occupation    Stability/Clinical Decision Making Stable/Uncomplicated    Rehab Potential Good    PT Frequency Other (comment)   1-2x/week   PT Duration Other (comment)   10 weeks   PT Treatment/Interventions ADLs/Self Care Home Management;Electrical Stimulation;Cryotherapy;Iontophoresis 35m/ml Dexamethasone;Moist Heat;Balance training;Therapeutic exercise;Therapeutic activities;Stair training;Functional mobility training;Gait training;Ultrasound;DME Instruction;Patient/family education;Passive range of motion;Dry needling;Joint Manipulations;Vasopneumatic Device;Taping;Manual techniques    PT Next Visit Plan continue with hip/core stability, shoulder stability with core activation, see if injection is scheduled    PT Home Exercise Plan Access Code: [email or web address] URL: [email or web address]  Date: 11/24/2021  Prepared by: BElsie Ra   Exercises  Standing Lumbar Extension with Counter - 2 x daily - 6 x weekly - 1-2 sets - 10 reps - 5 sec hold  Seated Piriformis Stretch - 2 x daily - 6 x weekly - 1 sets - 3 reps - 30 sec hold  shoulder ER stretch in doorway - 2 x daily - 6 x weekly - 1 sets - 5 reps - 10 hold  Standing Sleeper Stretch at [place]- 2 x daily - 6 x weekly - 1 sets - 5 reps - 10 sec hold  Shoulder External Rotation and Scapular Retraction with Resistance - 2 x daily - 6 x weekly - 2 sets - 10 reps  Standing Shoulder Flexion with Resistance - 2 x daily - 6 x weekly - 1-2 sets - 10 reps    Consulted and Agree with Plan of Care Patient             Patient will benefit from skilled therapeutic intervention in order to improve the following deficits and impairments:  Abnormal gait, Hypomobility, Pain, Decreased strength, Decreased activity tolerance, Increased fascial restricitons, Difficulty walking, Decreased mobility,  Decreased balance, Decreased range of motion, Impaired perceived functional ability, Improper body mechanics, Impaired flexibility, Decreased coordination  Visit Diagnosis: Chronic right-sided low back pain with right-sided sciatica  Chronic left shoulder pain  Muscle weakness (generalized)     Problem List Patient Active Problem List   Diagnosis Date Noted   PVD (posterior vitreous detachment) 07/27/2021   Pseudophakia of right eye 07/27/2021   Pseudophakia of left eye 07/27/2021   Vitreomacular adhesion of right eye 07/27/2021   Macular pucker, right eye 040/76/8088  Hardware complicating wound infection ([place]    Neck pain 10/05/2020   Nodule of upper lobe of right lung 08/17/2020   Biceps tendonosis of left shoulder 02/13/2020   Impingement syndrome of left shoulder 02/13/2020   Post-operative state 02/13/2020   Ankle dislocation, right, initial encounter 03/10/2019   Trimalleolar fracture of ankle, closed, right, initial encounter    Chronic left shoulder pain 02/19/2019   Anxiety 12/17/2016   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[place] 12/17/2016   Osteopenia 12/17/2016   Paroxysmal atrial fibrillation ([place] 12/17/2016   Pure hypercholesterolemia 12/17/2016   Rectal cancer ([place] 07/30/2015    SLaureen Abrahams PT, DPT 12/03/21 10:10 AM    [facility] [address] [place] [address]Phone: [phone]  Fax:  [phone] Name: Julie DIVINEYMRN: [id number] of Birth: [birth year]

## 2021-12-06 ENCOUNTER — Other Ambulatory Visit (HOSPITAL_BASED_OUTPATIENT_CLINIC_OR_DEPARTMENT_OTHER): Payer: Self-pay | Admitting: *Deleted

## 2021-12-06 ENCOUNTER — Ambulatory Visit (INDEPENDENT_AMBULATORY_CARE_PROVIDER_SITE_OTHER): Payer: Medicare Other | Admitting: Obstetrics & Gynecology

## 2021-12-06 ENCOUNTER — Other Ambulatory Visit (HOSPITAL_COMMUNITY): Payer: Self-pay

## 2021-12-06 ENCOUNTER — Other Ambulatory Visit: Payer: Self-pay

## 2021-12-06 ENCOUNTER — Encounter (HOSPITAL_BASED_OUTPATIENT_CLINIC_OR_DEPARTMENT_OTHER): Payer: Self-pay | Admitting: Obstetrics & Gynecology

## 2021-12-06 VITALS — BP 130/84 | HR 78 | Resp 14

## 2021-12-06 DIAGNOSIS — F5101 Primary insomnia: Secondary | ICD-10-CM | POA: Insufficient documentation

## 2021-12-06 DIAGNOSIS — I480 Paroxysmal atrial fibrillation: Secondary | ICD-10-CM | POA: Diagnosis not present

## 2021-12-06 DIAGNOSIS — C20 Malignant neoplasm of rectum: Secondary | ICD-10-CM

## 2021-12-06 DIAGNOSIS — F419 Anxiety disorder, unspecified: Secondary | ICD-10-CM | POA: Diagnosis not present

## 2021-12-06 DIAGNOSIS — B0089 Other herpesviral infection: Secondary | ICD-10-CM | POA: Diagnosis not present

## 2021-12-06 DIAGNOSIS — Z9289 Personal history of other medical treatment: Secondary | ICD-10-CM | POA: Diagnosis not present

## 2021-12-06 DIAGNOSIS — Z01411 Encounter for gynecological examination (general) (routine) with abnormal findings: Secondary | ICD-10-CM

## 2021-12-06 MED ORDER — ACYCLOVIR 5 % EX OINT: 1.0000 "application " | TOPICAL_OINTMENT | CUTANEOUS | 2 refills | Status: DC

## 2021-12-06 MED ORDER — ACYCLOVIR 5 % EX OINT
1.0000 "application " | TOPICAL_OINTMENT | CUTANEOUS | 2 refills | Status: DC
  Filled 2021-12-06: qty 30, 5d supply, fill #0

## 2021-12-06 NOTE — Patient Instructions (Signed)
Dr. [facility] [address] [place], [place] [address] Call us [phone]

## 2021-12-06 NOTE — Progress Notes (Signed)
76 y.o. [id number] Married White or Caucasian female here for breast and pelvic exam.  I am also following her for chronic insomnia.  Takes 1/2 lorazepam nightly.  Has used this for years.  #30 done 11/2021.  Will need RF in March.  Also uses motrin for shoulder pain.  This was done in January as well for #30.  Will let me know when needs RF.  Still playing the violin professionally.    Denies vaginal bleeding.  H/o anal cancer.  Had colonoscopy last year with Dr. Cristina Gong.  He has retired and she has not decided who she will see for follow-up.    Moved to [facility] this past year.  This has been a good decision for her.    Has lung nodule that was biopsied and is being followed with CT scan.    Having some ulcerations on buttocks.  Using OTC product.  Would like something different to try.  Does not want oral medication treatment.    Patient's last menstrual period was 11/14/1992.          Sexually active: No.  H/O STD:  no  Health Maintenance: PCP:  Dr. Felipa Eth.  Last wellness appt was 11/2021.  Did blood work at that appt: did blood work in 2022 so this was not repeated Vaccines are up to date:  reviewed.  She declines any future covid vaccines.   Colonoscopy:  2022.  Dr. Cristina Gong MMG:  10/19/2021 Negative BMD:  01/08/2013.  Pt feels certain she's had one with Dr. Felipa Eth in the last year or two.  She is aware that I do not have a copy of this. Last pap smear:  07/25/2019 Negative.   H/o abnormal pap smear:     reports that she quit smoking about 18 years ago. Her smoking use included cigarettes. She has a 30.00 pack-year smoking history. She has never used smokeless tobacco. She reports that she does not drink alcohol and does not use drugs.  Past Medical History:  Diagnosis Date   Anal cancer ([place]) 2005   SCCa anus-stage II chemo, radiation   Anxiety    Arthritis    "hands, fingers, back" (02/27/2014)   Atrial fibrillation ([place])    Cat scratch of right hand 02/24/2014   COPD  (chronic obstructive pulmonary disease) ([place])    Dyspnea    walking up hill;.  stairs (If Iam tired)   Dysrhythmia    PAF   Fall from horse 01/2018   Fracture, ankle 02/2019   Right    High cholesterol    History of stomach ulcers ~ 1966   Osteopenia     Past Surgical History:  Procedure Laterality Date   ANUS SURGERY  2005   "biopsy"   BREAST CYST EXCISION Right 1966   DILATION AND CURETTAGE OF UTERUS  1980's   S/P miscarriage   EXTERNAL FIXATION LEG Right 03/10/2019   Procedure: OPEN REDUCTION INTERNAL FIXATION RIGHT ANKLE;  Surgeon: Marchia Bond, MD;  Location: [facility];  Service: Orthopedics;  Laterality: Right;   HARDWARE REMOVAL Right 02/05/2021   Procedure: REMOVAL OF HARDWARE RIGHT ANKLE;  Surgeon: Newt Minion, MD;  Location: [facility];  Service: Orthopedics;  Laterality: Right;   VARICOSE VEIN SURGERY Left    left leg   VIDEO BRONCHOSCOPY WITH ENDOBRONCHIAL NAVIGATION Right 08/25/2020   Procedure: VIDEO BRONCHOSCOPY WITH ENDOBRONCHIAL NAVIGATION WITH FIDUCIAL PLACEMENT;  Surgeon: Garner Nash, DO;  Location: [facility];  Service: Pulmonary;  Laterality: Right;  Current Outpatient Medications  Medication Sig Dispense Refill   APPLE CIDER VINEGAR PO Take 5 mLs by mouth daily as needed (Constipation).     aspirin EC 81 MG tablet Take 1 tablet (81 mg total) by mouth daily. 90 tablet 3   atorvastatin (LIPITOR) 10 MG tablet TAKE 1 TABLET BY MOUTH ON MONDAY AND FRIDAY 80 23 tablet 3   ibuprofen (ADVIL) 600 MG tablet Take 1 tablet (600 mg total) by mouth daily as needed for moderate pain 30 tablet 0   LORazepam (ATIVAN) 1 MG tablet Take 1 tablet (1 mg total) by mouth at bedtime as needed for anxiety or sleep. 30 tablet 1   metoprolol tartrate (LOPRESSOR) 25 MG tablet Take 25 mg by mouth daily as needed (palpitations/AFIB).      Multiple Vitamins-Minerals (MULTIVITAMIN PO) Take 1 tablet by mouth daily. One a day     OVER THE COUNTER MEDICATION Take 5 mLs by mouth daily as needed  (Constipation). Olive oil     Propylene Glycol-Glycerin 0.6-0.6 % SOLN Place 1 drop into both eyes 2 (two) times daily. Soothe Dry eyes     acyclovir ointment (ZOVIRAX) 5 % Apply 1 application topically every 3 (three) hours. Use for up to 5 days 30 g 2   No current facility-administered medications for this visit.    Family History  Problem Relation Age of Onset   Breast cancer Maternal Aunt    Breast cancer Maternal Grandmother    Heart disease Mother    Stroke Father    Rectal cancer Paternal Grandmother    Breast cancer Sister 11   Breast cancer Sister 31    Review of Systems  All other systems reviewed and are negative.  Exam:   BP 130/84    Pulse 78    Resp 14    LMP 11/14/1992      General appearance: alert, cooperative and appears stated age Breasts: normal appearance, no masses or tenderness Abdomen: soft, non-tender; bowel sounds normal; no masses,  no organomegaly Lymph nodes: Cervical, supraclavicular, and axillary nodes normal.  No abnormal inguinal nodes palpated Neurologic: Grossly normal  Pelvic: External genitalia:  no lesions              Urethra:  normal appearing urethra with no masses, tenderness or lesions              Bartholins and Skenes: normal                 Vagina: normal appearing vagina with atrophic changes and no discharge, no lesions              Cervix: no lesions              Pap taken: No. Bimanual Exam:  Uterus:  normal size, contour, position, consistency, mobility, non-tender              Adnexa: normal adnexa and no mass, fullness, tenderness               Rectovaginal: Confirms               Anus:  normal sphincter tone, no lesions, stable radiation changes  Chaperone, Octaviano Batty, CMA, was present for exam.  Assessment/Plan: 1. Encntr for gyn exam (general) (routine) w abnormal findings - pap smear guidelines reviewed - MMG up to date - colonoscopy release signed - pt is sure BMD is up to date - vaccines updated - lab work  done with Dr. Felipa Eth  2. Rectal cancer ([place]) - GI names given for follow up at Dr. Cristina Gong has retired  3. Anxiety - using lorazepam 1mg , 1/2 tab nightly for sleep.  4. Paroxysmal atrial fibrillation (HCC) - followed by Dr. Tamala Julian, on baby ASA  5.  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  May need to have this sent to [facility]

## 2021-12-07 ENCOUNTER — Ambulatory Visit: Payer: Medicare Other | Admitting: Physical Therapy

## 2021-12-07 ENCOUNTER — Encounter: Payer: Medicare Other | Admitting: Rehabilitative and Restorative Service Providers"

## 2021-12-07 ENCOUNTER — Encounter: Payer: Medicare Other | Admitting: Physical Therapy

## 2021-12-07 DIAGNOSIS — M25571 Pain in right ankle and joints of right foot: Secondary | ICD-10-CM

## 2021-12-07 DIAGNOSIS — M6281 Muscle weakness (generalized): Secondary | ICD-10-CM | POA: Diagnosis not present

## 2021-12-07 DIAGNOSIS — M25512 Pain in left shoulder: Secondary | ICD-10-CM | POA: Diagnosis not present

## 2021-12-07 DIAGNOSIS — M5441 Lumbago with sciatica, right side: Secondary | ICD-10-CM | POA: Diagnosis not present

## 2021-12-07 DIAGNOSIS — G8929 Other chronic pain: Secondary | ICD-10-CM | POA: Diagnosis not present

## 2021-12-07 DIAGNOSIS — R262 Difficulty in walking, not elsewhere classified: Secondary | ICD-10-CM | POA: Diagnosis not present

## 2021-12-07 NOTE — Therapy (Signed)
[facility] [address] [place], [place], [address] Phone: [phone]   Fax:  [phone]  Physical Therapy Treatment  Patient Details  Name: Julie Mann MRN: [id number] Date of Birth: 1946/05/21 Referring Provider (PT): Newt Minion, MD   Encounter Date: 12/07/2021   PT End of Session - 12/07/21 1156     Visit Number 7    Number of Visits 20    Date for PT Re-Evaluation 01/25/22    Authorization Type [employer] Medicare $20 copay    Progress Note Due on Visit 10    PT Start Time 1104    PT Stop Time 1007    PT Time Calculation (min) 41 min    Activity Tolerance Patient tolerated treatment well    Behavior During Therapy [facility] for tasks assessed/performed             Past Medical History:  Diagnosis Date   Anal cancer ([place]) 2005   SCCa anus-stage II chemo, radiation   Anxiety    Arthritis    "hands, fingers, back" (02/27/2014)   Atrial fibrillation ([place])    Cat scratch of right hand 02/24/2014   COPD (chronic obstructive pulmonary disease) ([place])    Dyspnea    walking up hill;.  stairs (If Iam tired)   Dysrhythmia    PAF   Fall from horse 01/2018   Fracture, ankle 02/2019   Right    High cholesterol    History of stomach ulcers ~ 1966   Osteopenia     Past Surgical History:  Procedure Laterality Date   ANUS SURGERY  2005   "biopsy"   BREAST CYST EXCISION Right 1966   [place]  1980's   S/P miscarriage   EXTERNAL FIXATION LEG Right 03/10/2019   Procedure: OPEN REDUCTION INTERNAL FIXATION RIGHT ANKLE;  Surgeon: Marchia Bond, MD;  Location: [place];  Service: Orthopedics;  Laterality: Right;   HARDWARE REMOVAL Right 02/05/2021   Procedure: REMOVAL OF HARDWARE RIGHT ANKLE;  Surgeon: Newt Minion, MD;  Location: [place];  Service: Orthopedics;  Laterality: Right;   VARICOSE VEIN SURGERY Left    left leg   VIDEO BRONCHOSCOPY WITH ENDOBRONCHIAL NAVIGATION Right 08/25/2020   Procedure: VIDEO BRONCHOSCOPY  WITH ENDOBRONCHIAL NAVIGATION WITH FIDUCIAL PLACEMENT;  Surgeon: Garner Nash, DO;  Location: [place];  Service: Pulmonary;  Laterality: Right;    There were no vitals filed for this visit.   Subjective Assessment - 12/07/21 1111     Subjective she relays pain is getting better overall 2/10 upon arrival for her back and shoulder.    Pertinent History status post hardware removal of the fibula from a trimalleolar fracture hardware removal on 02/05/2021, COPD, high cholesterol, osteopenia.  Pt. stated having back pain without leg pain at times.  Worse at night.   Pt. indicated possibly having times of leg pain without back pain.    Limitations Standing;Walking;House hold activities;Lifting;Other (comment)   lying down   Diagnostic tests xray in chart    Patient Stated Goals Reduce pain    Pain Onset More than a month ago    Pain Onset More than a month ago                               [facility] Adult PT Treatment/Exercise - 12/07/21 0001       Lumbar Exercises: Standing   Other Standing Lumbar Exercises standing lumbar extensions  with elbows on wall 2x10, standing bicep curl to OH shoulder press Lt X15 with 2#    Other Standing Lumbar Exercises in wall pushup position: alternating hip extension 2x10      Lumbar Exercises: Seated   Other Seated Lumbar Exercises half kneeling with knee on airex pad for diagonal lifts 2# ball  X10 bilat      Lumbar Exercises: Supine   Dead Bug 15 reps    Dead Bug Limitations 2# UE and LE    Bridge 20 reps;5 seconds    Other Supine Lumbar Exercises chest press with shoulder flexion into her tolerated ROM 2# X15 bilat    Other Supine Lumbar Exercises supine horizontal shoulder abd red 2X10      Lumbar Exercises: Quadruped   Madcat/Old Horse 10 reps    Madcat/Old Horse Limitations holding 5 sec      Shoulder Exercises: ROM/Strengthening   UBE (Upper Arm Bike) UE/LE x8 min; L4                       PT Short Term Goals  - 12/03/21 1006       PT SHORT TERM GOAL #1   Title Patient will demonstrate independent use of home exercise program to maintain progress from in clinic treatments.    Time 3    Period Weeks    Status Achieved    Target Date 12/07/21               PT Long Term Goals - 11/24/21 1308       PT LONG TERM GOAL #1   Title Patient will demonstrate/report pain at worst less than or equal to 2/10 to facilitate minimal limitation in daily activity secondary to pain symptoms.    Time 10    Period Weeks    Status On-going    Target Date 01/25/22      PT LONG TERM GOAL #2   Title Patient will demonstrate independent use of home exercise program to facilitate ability to maintain/progress functional gains from skilled physical therapy services.    Baseline modified today    Time 10    Period Weeks    Status On-going    Target Date 01/25/22      PT LONG TERM GOAL #3   Title Pt. will demonstrate lumbar ext AROM > or = 100 % to facilitate upright standing posture at PLOF.    Time 10    Period Weeks    Status New    Target Date 01/25/22      PT LONG TERM GOAL #4   Title Pt. will demonstrate Rt leg slump testing negative, equal to Lt s symptoms for normalized mobility.    Time 10    Period Weeks    Status On-going    Target Date 01/25/22      PT LONG TERM GOAL #5   Title She will improve Lt shoulder ROM to [facility]    Baseline mild to mod limitations    Time 8    Period Weeks    Status New    Target Date 01/25/22      Additional Long Term Goals   Additional Long Term Goals Yes      PT LONG TERM GOAL #6   Title She will improve Lt shoulder strength to 5/5 MMT all planes for functional reaching and lifting    Time 8    Period Weeks    Status New  Target Date 01/25/22                   Plan - 12/07/21 1112     Clinical Impression Statement After MRI results she will get lumbar injection 12/20/21. She does feel she is improving overall for her low back pain and  radicular pain. We will continue to work on lumbar extension exercises, core strength and Lt shoulder strengthening to her tolerance.    Personal Factors and Comorbidities Comorbidity 3+    Comorbidities COPD, high cholesterol, osteopenia    Examination-Activity Limitations Sleep;Bed Mobility;Sit;Bend;Stand;Locomotion Level    Examination-Participation Restrictions Community Activity;Shop;Yard Work;Occupation    Stability/Clinical Decision Making Stable/Uncomplicated    Rehab Potential Good    PT Frequency Other (comment)   1-2x/week   PT Duration Other (comment)   10 weeks   PT Treatment/Interventions ADLs/Self Care Home Management;Electrical Stimulation;Cryotherapy;Iontophoresis 4mg /ml Dexamethasone;Moist Heat;Balance training;Therapeutic exercise;Therapeutic activities;Stair training;Functional mobility training;Gait training;Ultrasound;DME Instruction;Patient/family education;Passive range of motion;Dry needling;Joint Manipulations;Vasopneumatic Device;Taping;Manual techniques    PT Next Visit Plan continue with hip/core stability, shoulder stability with core activation, see if injection is scheduled    PT Home Exercise Plan Access Code: [email or web address]  URL: [email or web address]  Date: 11/24/2021  Prepared by: Elsie Ra    Exercises  Standing Lumbar Extension with Counter - 2 x daily - 6 x weekly - 1-2 sets - 10 reps - 5 sec hold  Seated Piriformis Stretch - 2 x daily - 6 x weekly - 1 sets - 3 reps - 30 sec hold  shoulder ER stretch in doorway - 2 x daily - 6 x weekly - 1 sets - 5 reps - 10 hold  Standing Sleeper Stretch at [place] - 2 x daily - 6 x weekly - 1 sets - 5 reps - 10 sec hold  Shoulder External Rotation and Scapular Retraction with Resistance - 2 x daily - 6 x weekly - 2 sets - 10 reps  Standing Shoulder Flexion with Resistance - 2 x daily - 6 x weekly - 1-2 sets - 10 reps    Consulted and Agree with Plan of Care Patient             Patient will benefit from skilled  therapeutic intervention in order to improve the following deficits and impairments:  Abnormal gait, Hypomobility, Pain, Decreased strength, Decreased activity tolerance, Increased fascial restricitons, Difficulty walking, Decreased mobility, Decreased balance, Decreased range of motion, Impaired perceived functional ability, Improper body mechanics, Impaired flexibility, Decreased coordination  Visit Diagnosis: Chronic right-sided low back pain with right-sided sciatica  Chronic left shoulder pain  Muscle weakness (generalized)  Pain in right ankle and joints of right foot  Difficulty in walking, not elsewhere classified     Problem List 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   PVD (posterior vitreous detachment) 07/27/2021   Pseudophakia of right eye 07/27/2021   Pseudophakia of left eye 07/27/2021   Vitreomacular adhesion of right eye 07/27/2021   Macular pucker, right eye 22/97/9892   Hardware complicating wound infection ([place])    Neck pain 10/05/2020   Nodule of upper lobe of right lung 08/17/2020   Biceps tendonosis of left shoulder 02/13/2020   Impingement syndrome of left shoulder 02/13/2020   Post-operative state 02/13/2020   Ankle dislocation, right, initial encounter 03/10/2019   Trimalleolar fracture of ankle, closed, right, initial encounter    Chronic left shoulder pain 02/19/2019   Anxiety 12/17/2016   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[place]) 12/17/2016  Osteopenia 12/17/2016   Paroxysmal atrial fibrillation ([place]) 12/17/2016   Pure hypercholesterolemia 12/17/2016   Rectal cancer ([place]) 07/30/2015    Debbe Odea, PT,DPT 12/07/2021, 11:57 AM  [facility] Physical Therapy [address] [place], [place], [address] Phone: [phone]   Fax:  [phone]  Name: Julie Mann MRN: [id number] Date of Birth: [birth year]

## 2021-12-08 ENCOUNTER — Encounter (HOSPITAL_BASED_OUTPATIENT_CLINIC_OR_DEPARTMENT_OTHER): Payer: Self-pay | Admitting: Obstetrics & Gynecology

## 2021-12-09 ENCOUNTER — Encounter: Payer: Medicare Other | Admitting: Physical Therapy

## 2021-12-09 ENCOUNTER — Encounter: Payer: Medicare Other | Admitting: Rehabilitative and Restorative Service Providers"

## 2021-12-10 ENCOUNTER — Other Ambulatory Visit (HOSPITAL_COMMUNITY): Payer: Self-pay

## 2021-12-10 ENCOUNTER — Other Ambulatory Visit (HOSPITAL_BASED_OUTPATIENT_CLINIC_OR_DEPARTMENT_OTHER): Payer: Self-pay | Admitting: Obstetrics & Gynecology

## 2021-12-13 ENCOUNTER — Other Ambulatory Visit (HOSPITAL_COMMUNITY): Payer: Self-pay

## 2021-12-13 ENCOUNTER — Other Ambulatory Visit (HOSPITAL_BASED_OUTPATIENT_CLINIC_OR_DEPARTMENT_OTHER): Payer: Self-pay | Admitting: Obstetrics & Gynecology

## 2021-12-13 DIAGNOSIS — L308 Other specified dermatitis: Secondary | ICD-10-CM | POA: Diagnosis not present

## 2021-12-13 MED ORDER — TRIAMCINOLONE ACETONIDE 0.1 % EX CREA
TOPICAL_CREAM | CUTANEOUS | 3 refills | Status: DC
  Filled 2021-12-13: qty 454, 30d supply, fill #0

## 2021-12-14 ENCOUNTER — Ambulatory Visit: Payer: Medicare Other | Admitting: Physical Therapy

## 2021-12-14 ENCOUNTER — Other Ambulatory Visit: Payer: Self-pay

## 2021-12-14 ENCOUNTER — Encounter: Payer: Self-pay | Admitting: Physical Therapy

## 2021-12-14 ENCOUNTER — Encounter: Payer: Medicare Other | Admitting: Rehabilitative and Restorative Service Providers"

## 2021-12-14 DIAGNOSIS — M6281 Muscle weakness (generalized): Secondary | ICD-10-CM

## 2021-12-14 DIAGNOSIS — M25571 Pain in right ankle and joints of right foot: Secondary | ICD-10-CM | POA: Diagnosis not present

## 2021-12-14 DIAGNOSIS — G8929 Other chronic pain: Secondary | ICD-10-CM

## 2021-12-14 DIAGNOSIS — R262 Difficulty in walking, not elsewhere classified: Secondary | ICD-10-CM | POA: Diagnosis not present

## 2021-12-14 DIAGNOSIS — M25512 Pain in left shoulder: Secondary | ICD-10-CM | POA: Diagnosis not present

## 2021-12-14 DIAGNOSIS — M5441 Lumbago with sciatica, right side: Secondary | ICD-10-CM

## 2021-12-14 NOTE — Therapy (Signed)
[facility] Physical Therapy [address] [place], [place], [address] Phone: [phone]   Fax:  [phone]  Physical Therapy Treatment  Patient Details  Name: Julie Mann MRN: [id number] Date of Birth: June 30, 1946 Referring Provider (PT): Newt Minion, MD   Encounter Date: 12/14/2021   PT End of Session - 12/14/21 0936     Visit Number 8    Number of Visits 20    Date for PT Re-Evaluation 01/25/22    Authorization Type [employer] Medicare $20 copay    Progress Note Due on Visit 10    PT Start Time 0930    PT Stop Time 1012    PT Time Calculation (min) 42 min    Activity Tolerance Patient tolerated treatment well    Behavior During Therapy [facility] for tasks assessed/performed             Past Medical History:  Diagnosis Date   Anal cancer ([place]) 2005   SCCa anus-stage II chemo, radiation   Anxiety    Arthritis    "hands, fingers, back" (02/27/2014)   Atrial fibrillation ([place])    Cat scratch of right hand 02/24/2014   COPD (chronic obstructive pulmonary disease) ([place])    Dyspnea    walking up hill;.  stairs (If Iam tired)   Dysrhythmia    PAF   Fall from horse 01/2018   Fracture, ankle 02/2019   Right    High cholesterol    History of stomach ulcers ~ 1966   Osteopenia     Past Surgical History:  Procedure Laterality Date   ANUS SURGERY  2005   "biopsy"   BREAST CYST EXCISION Right 1966   [place]  1980's   S/P miscarriage   EXTERNAL FIXATION LEG Right 03/10/2019   Procedure: OPEN REDUCTION INTERNAL FIXATION RIGHT ANKLE;  Surgeon: Marchia Bond, MD;  Location: [place];  Service: Orthopedics;  Laterality: Right;   HARDWARE REMOVAL Right 02/05/2021   Procedure: REMOVAL OF HARDWARE RIGHT ANKLE;  Surgeon: Newt Minion, MD;  Location: [place];  Service: Orthopedics;  Laterality: Right;   VARICOSE VEIN SURGERY Left    left leg   VIDEO BRONCHOSCOPY WITH ENDOBRONCHIAL NAVIGATION Right 08/25/2020   Procedure: VIDEO BRONCHOSCOPY  WITH ENDOBRONCHIAL NAVIGATION WITH FIDUCIAL PLACEMENT;  Surgeon: Garner Nash, DO;  Location: [place];  Service: Pulmonary;  Laterality: Right;    There were no vitals filed for this visit.   Subjective Assessment - 12/14/21 0932     Subjective doing well, denies pain this morning    Pertinent History status post hardware removal of the fibula from a trimalleolar fracture hardware removal on 02/05/2021, COPD, high cholesterol, osteopenia.  Pt. stated having back pain without leg pain at times.  Worse at night.   Pt. indicated possibly having times of leg pain without back pain.    Limitations Standing;Walking;House hold activities;Lifting;Other (comment)   lying down   Diagnostic tests xray in chart    Patient Stated Goals Reduce pain    Currently in Pain? No/denies    Pain Onset More than a month ago                [facility] PT Assessment - 12/14/21 0949       Assessment   Medical Diagnosis Rt ankle s/p hardware removal 02/05/2021, Sciatica, chronic Lt shoulder pain.    Referring Provider (PT) Newt Minion, MD      AROM   Overall AROM Comments Lt shoulder AROM  WFL with mild pain with FIR    Lumbar Extension WNL with pain returning to midline      Strength   Strength Assessment Site Shoulder    Right/Left Shoulder Left    Left Shoulder Flexion 3+/5    Left Shoulder ABduction 3+/5    Left Shoulder Internal Rotation 4+/5    Left Shoulder External Rotation 4/5      Special Tests   Other special tests negative slump test bil                           [facility] Adult PT Treatment/Exercise - 12/14/21 0929       Lumbar Exercises: Standing   Other Standing Lumbar Exercises standing lumbar extensions with elbows on wall 2x10, standing bicep curl to OH shoulder press Lt X15 with 2# on foam; bil shoulder abduction on foam with 2# 2x10    Other Standing Lumbar Exercises in wall pushup position: hip extension 2x10 bil      Lumbar Exercises: Seated   Other Seated  Lumbar Exercises Russian Twist 2x10 wtih 4# total      Lumbar Exercises: Supine   Other Supine Lumbar Exercises crunch with 4# x10      Shoulder Exercises: ROM/Strengthening   Nustep UE/LE x8 min; L7                       PT Short Term Goals - 12/03/21 1006       PT SHORT TERM GOAL #1   Title Patient will demonstrate independent use of home exercise program to maintain progress from in clinic treatments.    Time 3    Period Weeks    Status Achieved    Target Date 12/07/21               PT Long Term Goals - 12/14/21 1031       PT LONG TERM GOAL #1   Title Patient will demonstrate/report pain at worst less than or equal to 2/10 to facilitate minimal limitation in daily activity secondary to pain symptoms.    Time 10    Period Weeks    Status On-going    Target Date 01/25/22      PT LONG TERM GOAL #2   Title Patient will demonstrate independent use of home exercise program to facilitate ability to maintain/progress functional gains from skilled physical therapy services.    Time 10    Period Weeks    Status Achieved    Target Date 01/25/22      PT LONG TERM GOAL #3   Title Pt. will demonstrate lumbar ext AROM > or = 100 % to facilitate upright standing posture at PLOF.    Time 10    Period Weeks    Status Achieved    Target Date 01/25/22      PT LONG TERM GOAL #4   Title Pt. will demonstrate Rt leg slump testing negative, equal to Lt s symptoms for normalized mobility.    Time 10    Period Weeks    Status Achieved    Target Date 01/25/22      PT LONG TERM GOAL #5   Title She will improve Lt shoulder ROM to [facility]    Baseline mild to mod limitations    Time 8    Period Weeks    Status Achieved    Target Date 01/25/22      PT LONG  TERM GOAL #6   Title She will improve Lt shoulder strength to 5/5 MMT all planes for functional reaching and lifting    Time 8    Period Weeks    Status On-going    Target Date 01/25/22                    Plan - 12/14/21 1031     Clinical Impression Statement Pt overall has demonstrated excellent progress with PT meeting 4/6 LTGs at this time.  Plan to hold PT until after MD follow up and possible injection and pt will call if she needs to return to PT.  She's compliant with extensive HEP at this time.    Personal Factors and Comorbidities Comorbidity 3+    Comorbidities COPD, high cholesterol, osteopenia    Examination-Activity Limitations Sleep;Bed Mobility;Sit;Bend;Stand;Locomotion Level    Examination-Participation Restrictions Community Activity;Shop;Yard Work;Occupation    Stability/Clinical Decision Making Stable/Uncomplicated    Rehab Potential Good    PT Frequency Other (comment)   1-2x/week   PT Duration Other (comment)   10 weeks   PT Treatment/Interventions ADLs/Self Care Home Management;Electrical Stimulation;Cryotherapy;Iontophoresis 4mg /ml Dexamethasone;Moist Heat;Balance training;Therapeutic exercise;Therapeutic activities;Stair training;Functional mobility training;Gait training;Ultrasound;DME Instruction;Patient/family education;Passive range of motion;Dry needling;Joint Manipulations;Vasopneumatic Device;Taping;Manual techniques    PT Next Visit Plan hold until after MD/injection, return PRN    PT Home Exercise Plan Access Code: [email or web address]  URL: [email or web address]  Date: 11/24/2021  Prepared by: Elsie Ra    Exercises  Standing Lumbar Extension with Counter - 2 x daily - 6 x weekly - 1-2 sets - 10 reps - 5 sec hold  Seated Piriformis Stretch - 2 x daily - 6 x weekly - 1 sets - 3 reps - 30 sec hold  shoulder ER stretch in doorway - 2 x daily - 6 x weekly - 1 sets - 5 reps - 10 hold  Standing Sleeper Stretch at [place] - 2 x daily - 6 x weekly - 1 sets - 5 reps - 10 sec hold  Shoulder External Rotation and Scapular Retraction with Resistance - 2 x daily - 6 x weekly - 2 sets - 10 reps  Standing Shoulder Flexion with Resistance - 2 x daily - 6 x weekly -  1-2 sets - 10 reps    Consulted and Agree with Plan of Care Patient             Patient will benefit from skilled therapeutic intervention in order to improve the following deficits and impairments:  Abnormal gait, Hypomobility, Pain, Decreased strength, Decreased activity tolerance, Increased fascial restricitons, Difficulty walking, Decreased mobility, Decreased balance, Decreased range of motion, Impaired perceived functional ability, Improper body mechanics, Impaired flexibility, Decreased coordination  Visit Diagnosis: Chronic right-sided low back pain with right-sided sciatica  Chronic left shoulder pain  Muscle weakness (generalized)  Pain in right ankle and joints of right foot  Difficulty in walking, not elsewhere classified     Problem List 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   PVD (posterior vitreous detachment) 07/27/2021   Pseudophakia of right eye 07/27/2021   Pseudophakia of left eye 07/27/2021   Vitreomacular adhesion of right eye 07/27/2021   Macular pucker, right eye 28/31/5176   Hardware complicating wound infection ([place])    Neck pain 10/05/2020   Nodule of upper lobe of right lung 08/17/2020   Biceps tendonosis of left shoulder 02/13/2020   Impingement syndrome of left shoulder 02/13/2020   Post-operative state 02/13/2020  Ankle dislocation, right, initial encounter 03/10/2019   Trimalleolar fracture of ankle, closed, right, initial encounter    Chronic left shoulder pain 02/19/2019   Anxiety 12/17/2016   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[place]) 12/17/2016   Osteopenia 12/17/2016   Paroxysmal atrial fibrillation ([place]) 12/17/2016   Pure hypercholesterolemia 12/17/2016   Rectal cancer ([place]) 07/30/2015      Laureen Abrahams, PT, DPT 12/14/21 10:34 AM     [facility] [address] [place], [place], [address] Phone: [phone]   Fax:  [phone]  Name:  SAPHYRE CILLO MRN: [id number] Date of Birth: October 24, 1946

## 2021-12-15 ENCOUNTER — Other Ambulatory Visit (HOSPITAL_COMMUNITY): Payer: Self-pay

## 2021-12-15 ENCOUNTER — Other Ambulatory Visit (HOSPITAL_BASED_OUTPATIENT_CLINIC_OR_DEPARTMENT_OTHER): Payer: Self-pay | Admitting: Obstetrics & Gynecology

## 2021-12-16 ENCOUNTER — Encounter: Payer: Medicare Other | Admitting: Physical Therapy

## 2021-12-16 ENCOUNTER — Other Ambulatory Visit (HOSPITAL_COMMUNITY): Payer: Self-pay

## 2021-12-16 ENCOUNTER — Encounter: Payer: Medicare Other | Admitting: Rehabilitative and Restorative Service Providers"

## 2021-12-17 ENCOUNTER — Ambulatory Visit
Admission: RE | Admit: 2021-12-17 | Discharge: 2021-12-17 | Disposition: A | Discharge status: Home / Self Care | Payer: Medicare Other | Source: Ambulatory Visit | Attending: Pulmonary Disease | Admitting: Pulmonary Disease

## 2021-12-17 DIAGNOSIS — R918 Other nonspecific abnormal finding of lung field: Secondary | ICD-10-CM | POA: Diagnosis not present

## 2021-12-17 DIAGNOSIS — R911 Solitary pulmonary nodule: Secondary | ICD-10-CM | POA: Diagnosis not present

## 2021-12-17 DIAGNOSIS — J439 Emphysema, unspecified: Secondary | ICD-10-CM | POA: Diagnosis not present

## 2021-12-17 DIAGNOSIS — I700 Atherosclerosis of aorta: Secondary | ICD-10-CM | POA: Diagnosis not present

## 2021-12-20 ENCOUNTER — Other Ambulatory Visit: Payer: Self-pay

## 2021-12-20 ENCOUNTER — Ambulatory Visit: Payer: Medicare Other | Admitting: Orthopedic Surgery

## 2021-12-20 ENCOUNTER — Encounter: Payer: Self-pay | Admitting: Orthopedic Surgery

## 2021-12-20 DIAGNOSIS — M5441 Lumbago with sciatica, right side: Secondary | ICD-10-CM

## 2021-12-20 DIAGNOSIS — G8929 Other chronic pain: Secondary | ICD-10-CM

## 2021-12-20 NOTE — Progress Notes (Signed)
Office Visit Note   Patient: Julie Mann           Date of Birth: 14-Apr-1946           MRN: [id number] Visit Date: 12/20/2021              Requested by: Lajean Manes, MD [address] [place],  [place] [address] PCP: Lajean Manes, MD  Chief Complaint  Patient presents with   Other     Scan review      HPI: Patient is a 76 year old woman who presents with persistent lower back pain with radicular symptoms.  Patient is also status post removal of hardware for infection.  Patient states that despite physical therapy that she finished last week she is still having persistent back pain.  Assessment & Plan: Visit Diagnoses:  1. Chronic right-sided low back pain with right-sided sciatica     Plan: We will make a referral to Dr. Ernestina Patches for evaluation for epidural steroid injection.  No indication for intervention for the ankle.  Follow-Up Instructions: Return if symptoms worsen or fail to improve.   Ortho Exam  Patient is alert, oriented, no adenopathy, well-dressed, normal affect, normal respiratory effort. Examination patient has persistent lower back pain and occasional radicular symptoms.  Review of the MRI scan shows degenerative disc disease at L5-S1 without stenosis and facet arthropathy at L4-5 without stenosis.  Examination of the right ankle the incision is well-healed there is a small 5 mm scab distally patient states occasionally she has some drainage there is no redness no cellulitis no swelling no tenderness to palpation.  Imaging: No results found. No images are attached to the encounter.  Labs: Lab Results  Component Value Date   REPTSTATUS 02/10/2021 FINAL 02/05/2021   GRAMSTAIN  02/05/2021    MODERATE WBC PRESENT,BOTH PMN AND MONONUCLEAR NO ORGANISMS SEEN    CULT  02/05/2021    RARE STAPHYLOCOCCUS CAPITIS RARE STAPHYLOCOCCUS EPIDERMIDIS NO ANAEROBES ISOLATED Performed at [facility], [address], [place], [place]  [address]    [place] 02/05/2021   LABORGA STAPHYLOCOCCUS EPIDERMIDIS 02/05/2021     Lab Results  Component Value Date   ALBUMIN 3.6 08/25/2020   ALBUMIN 4.1 03/24/2009   ALBUMIN 3.9 09/24/2008    No results found for: MG No results found for: VD25OH  No results found for: PREALBUMIN CBC EXTENDED Latest Ref Rng & Units 02/05/2021 08/25/2020 03/10/2019  WBC 4.0 - 10.5 K/uL 7.5 6.6 13.1(H)  RBC 3.87 - 5.11 MIL/uL 5.22(H) 4.76 4.70  HGB 12.0 - 15.0 g/dL 13.9 12.6 12.8  HCT 36.0 - 46.0 % 45.2 40.5 40.5  PLT 150 - 400 K/uL 391 321 287  NEUTROABS 1.7 - 7.7 K/uL - - -  LYMPHSABS 0.7 - 4.0 K/uL - - -     There is no height or weight on file to calculate BMI.  Orders:  Orders Placed This Encounter  Procedures   Ambulatory referral to Physical Medicine Rehab   No orders of the defined types were placed in this encounter.    Procedures: No procedures performed  Clinical Data: No additional findings.  ROS:  All other systems negative, except as noted in the HPI. Review of Systems  Objective: Vital Signs: LMP 11/14/1992   Specialty Comments:  No specialty comments available.  PMFS History: 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   PVD (posterior vitreous detachment) 07/27/2021   Pseudophakia of  right eye 07/27/2021   Pseudophakia of left eye 07/27/2021   Vitreomacular adhesion of right eye 07/27/2021   Macular pucker, right eye 14/97/0263   Hardware complicating wound infection ([place])    Neck pain 10/05/2020   Nodule of upper lobe of right lung 08/17/2020   Biceps tendonosis of left shoulder 02/13/2020   Impingement syndrome of left shoulder 02/13/2020   Post-operative state 02/13/2020   Ankle dislocation, right, initial encounter 03/10/2019   Trimalleolar fracture of ankle, closed, right, initial encounter    Chronic left shoulder pain 02/19/2019   Anxiety 12/17/2016   Major depression in remission  ([place]) 12/17/2016   Osteopenia 12/17/2016   Paroxysmal atrial fibrillation ([place]) 12/17/2016   Pure hypercholesterolemia 12/17/2016   Rectal cancer ([place]) 07/30/2015   Past Medical History:  Diagnosis Date   Anal cancer ([place]) 2005   SCCa anus-stage II chemo, radiation   Anxiety    Arthritis    "hands, fingers, back" (02/27/2014)   Atrial fibrillation ([place])    Cat scratch of right hand 02/24/2014   COPD (chronic obstructive pulmonary disease) ([facility])    Dyspnea    walking up hill;.  stairs (If Iam tired)   Dysrhythmia    PAF   Fall from horse 01/2018   Fracture, ankle 02/2019   Right    High cholesterol    History of stomach ulcers ~ 1966   Osteopenia     Family History  Problem Relation Age of Onset   Breast cancer Maternal Aunt    Breast cancer Maternal Grandmother    Heart disease Mother    Stroke Father    Rectal cancer Paternal Grandmother    Breast cancer Sister 59   Breast cancer Sister 66    Past Surgical History:  Procedure Laterality Date   ANUS SURGERY  2005   "biopsy"   BREAST CYST EXCISION Right 1966   DILATION AND CURETTAGE OF UTERUS  1980's   S/P miscarriage   EXTERNAL FIXATION LEG Right 03/10/2019   Procedure: OPEN REDUCTION INTERNAL FIXATION RIGHT ANKLE;  Surgeon: Marchia Bond, MD;  Location: [place];  Service: Orthopedics;  Laterality: Right;   HARDWARE REMOVAL Right 02/05/2021   Procedure: REMOVAL OF HARDWARE RIGHT ANKLE;  Surgeon: Newt Minion, MD;  Location: [place];  Service: Orthopedics;  Laterality: Right;   VARICOSE VEIN SURGERY Left    left leg   VIDEO BRONCHOSCOPY WITH ENDOBRONCHIAL NAVIGATION Right 08/25/2020   Procedure: VIDEO BRONCHOSCOPY WITH ENDOBRONCHIAL NAVIGATION WITH FIDUCIAL PLACEMENT;  Surgeon: Garner Nash, DO;  Location: [place];  Service: Pulmonary;  Laterality: Right;   Social History   Occupational History   Not on file  Tobacco Use   Smoking status: Former    Packs/day: 1.00    Years: 30.00    Pack years: 30.00     Types: Cigarettes    Quit date: 2005    Years since quitting: 18.1   Smokeless tobacco: Never  Vaping Use   Vaping Use: Never used  Substance and Sexual Activity   Alcohol use: No   Drug use: No   Sexual activity: Not Currently    Partners: Male    Birth control/protection: Post-menopausal

## 2021-12-21 ENCOUNTER — Telehealth: Payer: Self-pay | Admitting: Physical Medicine and Rehabilitation

## 2021-12-21 NOTE — Telephone Encounter (Signed)
Patient returned call asked for a call back.  Ph# [phone]

## 2021-12-22 ENCOUNTER — Encounter (HOSPITAL_BASED_OUTPATIENT_CLINIC_OR_DEPARTMENT_OTHER): Payer: Self-pay | Admitting: *Deleted

## 2021-12-23 ENCOUNTER — Encounter: Payer: Self-pay | Admitting: Physical Medicine and Rehabilitation

## 2021-12-23 ENCOUNTER — Ambulatory Visit: Payer: Medicare Other | Admitting: Physical Medicine and Rehabilitation

## 2021-12-23 ENCOUNTER — Other Ambulatory Visit: Payer: Self-pay

## 2021-12-23 ENCOUNTER — Other Ambulatory Visit (HOSPITAL_BASED_OUTPATIENT_CLINIC_OR_DEPARTMENT_OTHER): Payer: Self-pay | Admitting: *Deleted

## 2021-12-23 VITALS — BP 146/80 | HR 98

## 2021-12-23 DIAGNOSIS — M47816 Spondylosis without myelopathy or radiculopathy, lumbar region: Secondary | ICD-10-CM | POA: Diagnosis not present

## 2021-12-23 DIAGNOSIS — M5441 Lumbago with sciatica, right side: Secondary | ICD-10-CM | POA: Diagnosis not present

## 2021-12-23 DIAGNOSIS — M4726 Other spondylosis with radiculopathy, lumbar region: Secondary | ICD-10-CM | POA: Diagnosis not present

## 2021-12-23 DIAGNOSIS — G8929 Other chronic pain: Secondary | ICD-10-CM | POA: Diagnosis not present

## 2021-12-23 DIAGNOSIS — M5416 Radiculopathy, lumbar region: Secondary | ICD-10-CM | POA: Diagnosis not present

## 2021-12-23 MED ORDER — ACYCLOVIR 5 % EX OINT: 1.0000 "application " | TOPICAL_OINTMENT | CUTANEOUS | 2 refills | Status: DC

## 2021-12-23 NOTE — Progress Notes (Signed)
Julie Mann - 76 y.o. female MRN [id number]  Date of birth: 1946-03-28  Office Visit Note: Visit Date: 12/23/2021 PCP: Lajean Manes, MD Referred by: Lajean Manes, MD  Subjective: Chief Complaint  Patient presents with   Lower Back - Pain   Right Leg - Pain   HPI: Julie Mann is a 76 y.o. female who comes in today at the request of Dr. Meridee Score for evaluation of chronic, worsening and severe right sided lower back pain radiating to buttock and down leg to dorsum of foot. Patient reports pain has been ongoing for several months. Patient states her pain is exacerbated by movement and activity, also reports difficulty sleeping due to severe discomfort. She describes pain as a throbbing and sharp sensation, currently rates as 8 out of 10. Patient reports some relief of pain with home exercise regimen, rest and use of OTC medications. Patient recently completed regimen of formal physical therapy with our in house team and reports some relief of pain, however pain relief was short lived. Patients recent lumbar MRI exhibits moderate facet and ligamentum flavum hypertrophy at L4-L5, there is also small synovial cysts posterior to the facet joints bilaterally without impingement at this level. There is also disc space height loss and mild facet hypertrophy at L5-S1. No high grade spinal canal stenosis noted. Patient states she is a very active person and does walk several times during the week, also plays violin. Patient did sustain right ankle fracture during a fall in 2020 which was repaired by Dr. Meridee Score with hardware removal in 2022. Patient does not have a history of lumbar epidural steroid injections/back surgery. Patient denies focal weakness, numbness and tingling. Patient denies recent falls.   Review of Systems  Musculoskeletal:  Positive for back pain.  Neurological:  Negative for tingling, sensory change, focal weakness and weakness.  All other systems reviewed and are  negative. Otherwise per HPI.  Assessment & Plan: Visit Diagnoses:    ICD-10-CM   1. Lumbar radiculopathy  M54.16 Ambulatory referral to Physical Medicine Rehab    2. Chronic right-sided low back pain with right-sided sciatica  M54.41    G89.29     3. Other spondylosis with radiculopathy, lumbar region  M47.26     4. Facet hypertrophy of lumbar region  M47.816        Plan: Findings:  Chronic, worsening and severe right sided lower back pain radiating to buttock and down leg to dorsum of foot. Patient continues to have extricating and debilitating pain despite good conservative therapies such as formal physical therapy, home exercise regimen, rest and use of OTC medications. Patients clinical presentation and exam are consistent with right L5 nerve pattern vs facet joint syndrome as she does have moderate facet hypertrophy at L4-L5. We feel the next step is to perform a diagnostic and hopefully therapeutic right L5 transforaminal epidural steroid injection under fluoroscopic guidance. If patients right lower back pain persists after epidural injection we would consider the possibility of performing facet joint injections. Patient encouraged to remain active and continue home exercise regimen as tolerated. No red flag symptoms noted upon exam today.    Meds & Orders: No orders of the defined types were placed in this encounter.   Orders Placed This Encounter  Procedures   Ambulatory referral to Physical Medicine Rehab    Follow-up: Return for Right L5 transforaminal epidural steroid injection.   Procedures: No procedures performed      Clinical History: MRI LUMBAR SPINE WITHOUT  CONTRAST   TECHNIQUE: Multiplanar, multisequence MR imaging of the lumbar spine was performed. No intravenous contrast was administered.   COMPARISON:  Lumbar spine MRI 05/30/2012   FINDINGS: Segmentation:  Standard.   Alignment: Slight lower lumbar levoscoliosis. No significant listhesis.    Vertebrae: No fracture or suspicious marrow lesion. Mild degenerative endplate edema at R4-E3.   Conus medullaris and cauda equina: Conus extends to the T12 level and is suboptimally evaluated as it was not covered on axial images. Unremarkable appearance of the cauda equina.   Paraspinal and other soft tissues: Small renal cysts.   Disc levels:   Disc desiccation throughout the lumbar spine with exception of L3-4. Progressive, severe disc space narrowing at L5-S1.   L1-2: Negative.   L2-3: At most minimal disc bulging without stenosis.   L3-4: Minimal leftward disc bulging without stenosis.   L4-5: Disc bulging and moderate facet and ligamentum flavum hypertrophy, mildly progressed but without associated stenosis. Small synovial cysts posterior to the facet joints bilaterally, not in a position to cause neural impingement.   L5-S1: Disc bulging, endplate spurring, disc space height loss, and mild facet hypertrophy without significant stenosis.   IMPRESSION: 1. Mild progression advanced disc degeneration at L5-S1 without stenosis. 2. Mildly progressive facet hypertrophy at L4-5 without stenosis.     Electronically Signed   By: Logan Bores M.D.   On: 12/01/2021 11:05   She reports that she quit smoking about 18 years ago. Her smoking use included cigarettes. She has a 30.00 pack-year smoking history. She has never used smokeless tobacco. No results for input(s): HGBA1C, LABURIC in the last 8760 hours.  Objective:  VS:  HT:     WT:    BMI:      BP:(!) 146/80   HR:98bpm   TEMP: ( )   RESP:  Physical Exam Vitals and nursing note reviewed.  HENT:     Head: Normocephalic and atraumatic.     Right Ear: External ear normal.     Left Ear: External ear normal.     Nose: Nose normal.     Mouth/Throat:     Mouth: Mucous membranes are moist.  Eyes:     Extraocular Movements: Extraocular movements intact.  Cardiovascular:     Rate and Rhythm: Normal rate.     Pulses: Normal  pulses.  Pulmonary:     Effort: Pulmonary effort is normal.  Abdominal:     General: Abdomen is flat. There is no distension.  Musculoskeletal:        General: Tenderness present.     Cervical back: Normal range of motion.     Comments: Pt is slow to rise from seated position to standing. Good lumbar range of motion. Strong distal strength without clonus, no pain upon palpation of greater trochanters. Sensation intact bilaterally. Dysesthesias noted to right L5 dermatome. Walks independently, gait steady.   Skin:    General: Skin is warm and dry.     Capillary Refill: Capillary refill takes less than 2 seconds.  Neurological:     General: No focal deficit present.     Mental Status: She is alert and oriented to person, place, and time.  Psychiatric:        Mood and Affect: Mood normal.        Behavior: Behavior normal.    Ortho Exam  Imaging: No results found.  Past Medical/Family/Surgical/Social History: Medications & Allergies reviewed per EMR, new medications updated. 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   PVD (posterior vitreous detachment) 07/27/2021   Pseudophakia of right eye 07/27/2021   Pseudophakia of left eye 07/27/2021   Vitreomacular adhesion of right eye 07/27/2021   Macular pucker, right eye 85/46/2703   Hardware complicating wound infection ([place])    Neck pain 10/05/2020   Nodule of upper lobe of right lung 08/17/2020   Biceps tendonosis of left shoulder 02/13/2020   Impingement syndrome of left shoulder 02/13/2020   Post-operative state 02/13/2020   Ankle dislocation, right, initial encounter 03/10/2019   Trimalleolar fracture of ankle, closed, right, initial encounter    Chronic left shoulder pain 02/19/2019   Anxiety 12/17/2016   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  stairs (If Iam tired)   Dysrhythmia    PAF   Fall from horse 01/2018   Fracture, ankle 02/2019   Right    High cholesterol    History of stomach ulcers ~ 1966   Osteopenia    Family History  Problem Relation Age of Onset   Breast cancer Maternal Aunt    Breast cancer Maternal Grandmother    Heart disease Mother    Stroke Father    Rectal cancer Paternal Grandmother    Breast cancer Sister 66   Breast cancer Sister 74   Past Surgical History:  Procedure Laterality Date   ANUS SURGERY  2005   "biopsy"   BREAST CYST EXCISION Right 1966   DILATION AND CURETTAGE OF UTERUS  1980's   S/P miscarriage   EXTERNAL FIXATION LEG Right 03/10/2019   Procedure: OPEN REDUCTION INTERNAL FIXATION RIGHT ANKLE;  Surgeon: Marchia Bond, MD;  Location: [facility];  Service: Orthopedics;  Laterality: Right;   HARDWARE REMOVAL Right 02/05/2021   Procedure: REMOVAL OF HARDWARE RIGHT ANKLE;  Surgeon: Newt Minion, MD;  Location: [place] [facility];  Service: Orthopedics;  Laterality: Right;   VARICOSE VEIN SURGERY Left    left leg   VIDEO BRONCHOSCOPY WITH ENDOBRONCHIAL NAVIGATION Right 08/25/2020   Procedure: VIDEO BRONCHOSCOPY WITH ENDOBRONCHIAL NAVIGATION WITH FIDUCIAL PLACEMENT;  Surgeon: Garner Nash, DO;  Location: [facility];  Service: Pulmonary;  Laterality: Right;   Social History   Occupational History   Not on file  Tobacco Use   Smoking status: Former    Packs/day: 1.00    Years: 30.00    Pack years: 30.00    Types: Cigarettes    Quit date: 2005    Years since quitting: 18.1   Smokeless tobacco: Never  Vaping Use   Vaping Use: Never used  Substance and Sexual  Activity   Alcohol use: No   Drug use: No   Sexual activity: Not Currently    Partners: Male    Birth control/protection: Post-menopausal

## 2021-12-23 NOTE — Progress Notes (Signed)
Pt state lower back pain that travels down her right leg. Pt state any movement makes the pain worse. Pt state she uses pain patches and over the counter pain meds to help ease her pain. Pt state she has tried PT.  Numeric Pain Rating Scale and Functional Assessment Average Pain 6 Pain Right Now 3 My pain is constant, dull, tingling, and aching Pain is worse with: walking, bending, sitting, standing, and some activites Pain improves with: therapy/exercise and medication   In the last MONTH (on 0-10 scale) has pain interfered with the following?  1. General activity like being  able to carry out your everyday physical activities such as walking, climbing stairs, carrying groceries, or moving a chair?  Rating(7)  2. Relation with others like being able to carry out your usual social activities and roles such as  activities at home, at work and in your community. Rating(8)  3. Enjoyment of life such that you have  been bothered by emotional problems such as feeling anxious, depressed or irritable?  Rating(9)

## 2021-12-24 NOTE — Progress Notes (Signed)
Please let patient know I reviewed her CT scan of the chest.  Her lung nodules are stable.  She is also likely to qualify for lung cancer screening to start with low-dose lung cancer screening CT in February 2024.  Please place referral to ambulatory lung cancer screening program.  Thanks,  BLI  Garner Nash, DO [facility] 12/24/2021 4:19 PM

## 2021-12-28 ENCOUNTER — Other Ambulatory Visit: Payer: Self-pay

## 2021-12-28 DIAGNOSIS — R911 Solitary pulmonary nodule: Secondary | ICD-10-CM

## 2021-12-29 ENCOUNTER — Encounter (HOSPITAL_BASED_OUTPATIENT_CLINIC_OR_DEPARTMENT_OTHER): Payer: Self-pay | Admitting: *Deleted

## 2021-12-31 ENCOUNTER — Other Ambulatory Visit (HOSPITAL_COMMUNITY): Payer: Self-pay

## 2022-01-03 DIAGNOSIS — L570 Actinic keratosis: Secondary | ICD-10-CM | POA: Diagnosis not present

## 2022-01-03 DIAGNOSIS — L308 Other specified dermatitis: Secondary | ICD-10-CM | POA: Diagnosis not present

## 2022-01-03 DIAGNOSIS — X32XXXD Exposure to sunlight, subsequent encounter: Secondary | ICD-10-CM | POA: Diagnosis not present

## 2022-01-05 ENCOUNTER — Other Ambulatory Visit: Payer: Self-pay

## 2022-01-05 ENCOUNTER — Ambulatory Visit: Payer: Self-pay

## 2022-01-05 ENCOUNTER — Encounter: Payer: Self-pay | Admitting: Physical Medicine and Rehabilitation

## 2022-01-05 ENCOUNTER — Other Ambulatory Visit (HOSPITAL_COMMUNITY): Payer: Self-pay

## 2022-01-05 ENCOUNTER — Ambulatory Visit (INDEPENDENT_AMBULATORY_CARE_PROVIDER_SITE_OTHER): Payer: Medicare Other | Admitting: Physical Medicine and Rehabilitation

## 2022-01-05 VITALS — BP 154/82 | HR 105

## 2022-01-05 DIAGNOSIS — M5416 Radiculopathy, lumbar region: Secondary | ICD-10-CM

## 2022-01-05 MED ORDER — METHYLPREDNISOLONE ACETATE 80 MG/ML IJ SUSP
80.0000 mg | Freq: Once | INTRAMUSCULAR | Status: AC
  Administered 2022-01-05: 80 mg

## 2022-01-05 MED ORDER — VALACYCLOVIR HCL 1 G PO TABS
2000.0000 mg | ORAL_TABLET | ORAL | 3 refills | Status: DC
  Filled 2022-01-05: qty 10, 5d supply, fill #0

## 2022-01-05 NOTE — Progress Notes (Signed)
Pt state lower back pain that travels down her right leg. Pt state any movement makes the pain worse. Pt state she uses pain patches and over the counter pain meds to help ease her pain.   Numeric Pain Rating Scale and Functional Assessment Average Pain 2   In the last MONTH (on 0-10 scale) has pain interfered with the following?  1. General activity like being  able to carry out your everyday physical activities such as walking, climbing stairs, carrying groceries, or moving a chair?  Rating(7)   +Driver, -BT, +Dye Allergies.

## 2022-01-05 NOTE — Patient Instructions (Signed)

## 2022-01-06 NOTE — Progress Notes (Signed)
Julie Mann - 76 y.o. female MRN [id number]  Date of birth: 1946/04/24  Office Visit Note: Visit Date: 01/05/2022 PCP: Lajean Manes, MD Referred by: Lajean Manes, MD  Subjective: Chief Complaint  Patient presents with   Lower Back - Pain   Right Leg - Pain   HPI:  Julie Mann is a 76 y.o. female who comes in today at the request of Barnet Pall, FNP for planned Right L5-S1 Lumbar Transforaminal epidural steroid injection with fluoroscopic guidance.  The patient has failed conservative care including home exercise, medications, time and activity modification.  This injection will be diagnostic and hopefully therapeutic.  Please see requesting physician notes for further details and justification.  Her case is complicated by allergy to contrast but this was with IV contrast study.  She did have swelling but not anaphylactic.  She has multiple drug intolerances up to 17 listed.  No underlying fibromyalgia at least in the chart.  We will use a very small amount of contrast for the injection.  This would not be intravascular.  ROS Otherwise per HPI.  Assessment & Plan: Visit Diagnoses:    ICD-10-CM   1. Lumbar radiculopathy  M54.16 XR C-ARM NO REPORT    Epidural Steroid injection    methylPREDNISolone acetate (DEPO-MEDROL) injection 80 mg      Plan: No additional findings.   Meds & Orders:  Meds ordered this encounter  Medications   methylPREDNISolone acetate (DEPO-MEDROL) injection 80 mg    Orders Placed This Encounter  Procedures   XR C-ARM NO REPORT   Epidural Steroid injection    Follow-up: Return if symptoms worsen or fail to improve.   Procedures: No procedures performed  Lumbosacral Transforaminal Epidural Steroid Injection - Sub-Pedicular Approach with Fluoroscopic Guidance  Patient: Julie Mann      Date of Birth: 01-12-1946 MRN: [id number] PCP: Lajean Manes, MD      Visit Date: 01/05/2022   Universal Protocol:    Date/Time: 01/05/2022  Consent  Given By: the patient  Position: PRONE  Additional Comments: Vital signs were monitored before and after the procedure. Patient was prepped and draped in the usual sterile fashion. The correct patient, procedure, and site was verified.   Injection Procedure Details:   Procedure diagnoses: Lumbar radiculopathy [M54.16]    Meds Administered:  Meds ordered this encounter  Medications   methylPREDNISolone acetate (DEPO-MEDROL) injection 80 mg    Laterality: Right  Location/Site: L5  Needle:5.0 in., 22 ga.  Short bevel or Quincke spinal needle  Needle Placement: Transforaminal  Findings:    -Comments: Excellent flow of contrast along the nerve, nerve root and into the epidural space.  Procedure Details: After squaring off the end-plates to get a true AP view, the C-arm was positioned so that an oblique view of the foramen as noted above was visualized. The target area is just inferior to the "nose of the scotty dog" or sub pedicular. The soft tissues overlying this structure were infiltrated with 2-3 ml. of 1% Lidocaine without Epinephrine.  The spinal needle was inserted toward the target using a "trajectory" view along the fluoroscope beam.  Under AP and lateral visualization, the needle was advanced so it did not puncture dura and was located close the 6 O'Clock position of the pedical in AP tracterory. Biplanar projections were used to confirm position. Aspiration was confirmed to be negative for CSF and/or blood. A 1-2 ml. volume of Isovue-250 was injected and flow of contrast was noted at each level.  Radiographs were obtained for documentation purposes.   After attaining the desired flow of contrast documented above, a 0.5 to 1.0 ml test dose of 0.25% Marcaine was injected into each respective transforaminal space.  The patient was observed for 90 seconds post injection.  After no sensory deficits were reported, and normal lower extremity motor function was noted,   the above  injectate was administered so that equal amounts of the injectate were placed at each foramen (level) into the transforaminal epidural space.   Additional Comments:  The patient tolerated the procedure well Dressing: 2 x 2 sterile gauze and Band-Aid    Post-procedure details: Patient was observed during the procedure. Post-procedure instructions were reviewed.  Patient left the clinic in stable condition.     Clinical History: MRI LUMBAR SPINE WITHOUT CONTRAST   TECHNIQUE: Multiplanar, multisequence MR imaging of the lumbar spine was performed. No intravenous contrast was administered.   COMPARISON:  Lumbar spine MRI 05/30/2012   FINDINGS: Segmentation:  Standard.   Alignment: Slight lower lumbar levoscoliosis. No significant listhesis.   Vertebrae: No fracture or suspicious marrow lesion. Mild degenerative endplate edema at N8-G9.   Conus medullaris and cauda equina: Conus extends to the T12 level and is suboptimally evaluated as it was not covered on axial images. Unremarkable appearance of the cauda equina.   Paraspinal and other soft tissues: Small renal cysts.   Disc levels:   Disc desiccation throughout the lumbar spine with exception of L3-4. Progressive, severe disc space narrowing at L5-S1.   L1-2: Negative.   L2-3: At most minimal disc bulging without stenosis.   L3-4: Minimal leftward disc bulging without stenosis.   L4-5: Disc bulging and moderate facet and ligamentum flavum hypertrophy, mildly progressed but without associated stenosis. Small synovial cysts posterior to the facet joints bilaterally, not in a position to cause neural impingement.   L5-S1: Disc bulging, endplate spurring, disc space height loss, and mild facet hypertrophy without significant stenosis.   IMPRESSION: 1. Mild progression advanced disc degeneration at L5-S1 without stenosis. 2. Mildly progressive facet hypertrophy at L4-5 without stenosis.     Electronically  Signed   By: Logan Bores M.D.   On: 12/01/2021 11:05     Objective:  VS:  HT:     WT:    BMI:      BP:(!) 154/82   HR:(!) 105bpm   TEMP: ( )   RESP:  Physical Exam Vitals and nursing note reviewed.  Constitutional:      General: She is not in acute distress.    Appearance: Normal appearance. She is not ill-appearing.  HENT:     Head: Normocephalic and atraumatic.     Right Ear: External ear normal.     Left Ear: External ear normal.  Eyes:     Extraocular Movements: Extraocular movements intact.  Cardiovascular:     Rate and Rhythm: Normal rate.     Pulses: Normal pulses.  Pulmonary:     Effort: Pulmonary effort is normal. No respiratory distress.  Abdominal:     General: There is no distension.     Palpations: Abdomen is soft.  Musculoskeletal:        General: Tenderness present.     Cervical back: Neck supple.     Right lower leg: No edema.     Left lower leg: No edema.     Comments: Patient has good distal strength with no pain over the greater trochanters.  No clonus or focal weakness.  Skin:  Findings: No erythema, lesion or rash.  Neurological:     General: No focal deficit present.     Mental Status: She is alert and oriented to person, place, and time.     Sensory: No sensory deficit.     Motor: No weakness or abnormal muscle tone.     Coordination: Coordination normal.  Psychiatric:        Mood and Affect: Mood normal.        Behavior: Behavior normal.     Imaging: XR C-ARM NO REPORT  Result Date: 01/05/2022 Please see Notes tab for imaging impression.

## 2022-01-06 NOTE — Procedures (Signed)
Lumbosacral Transforaminal Epidural Steroid Injection - Sub-Pedicular Approach with Fluoroscopic Guidance  Patient: Julie Mann      Date of Birth: 1946-10-15 MRN: [id number] PCP: Lajean Manes, MD      Visit Date: 01/05/2022   Universal Protocol:    Date/Time: 01/05/2022  Consent Given By: the patient  Position: PRONE  Additional Comments: Vital signs were monitored before and after the procedure. Patient was prepped and draped in the usual sterile fashion. The correct patient, procedure, and site was verified.   Injection Procedure Details:   Procedure diagnoses: Lumbar radiculopathy [M54.16]    Meds Administered:  Meds ordered this encounter  Medications   methylPREDNISolone acetate (DEPO-MEDROL) injection 80 mg    Laterality: Right  Location/Site: L5  Needle:5.0 in., 22 ga.  Short bevel or Quincke spinal needle  Needle Placement: Transforaminal  Findings:    -Comments: Excellent flow of contrast along the nerve, nerve root and into the epidural space.  Procedure Details: After squaring off the end-plates to get a true AP view, the C-arm was positioned so that an oblique view of the foramen as noted above was visualized. The target area is just inferior to the "nose of the scotty dog" or sub pedicular. The soft tissues overlying this structure were infiltrated with 2-3 ml. of 1% Lidocaine without Epinephrine.  The spinal needle was inserted toward the target using a "trajectory" view along the fluoroscope beam.  Under AP and lateral visualization, the needle was advanced so it did not puncture dura and was located close the 6 O'Clock position of the pedical in AP tracterory. Biplanar projections were used to confirm position. Aspiration was confirmed to be negative for CSF and/or blood. A 1-2 ml. volume of Isovue-250 was injected and flow of contrast was noted at each level. Radiographs were obtained for documentation purposes.   After attaining the desired flow  of contrast documented above, a 0.5 to 1.0 ml test dose of 0.25% Marcaine was injected into each respective transforaminal space.  The patient was observed for 90 seconds post injection.  After no sensory deficits were reported, and normal lower extremity motor function was noted,   the above injectate was administered so that equal amounts of the injectate were placed at each foramen (level) into the transforaminal epidural space.   Additional Comments:  The patient tolerated the procedure well Dressing: 2 x 2 sterile gauze and Band-Aid    Post-procedure details: Patient was observed during the procedure. Post-procedure instructions were reviewed.  Patient left the clinic in stable condition.

## 2022-01-12 ENCOUNTER — Other Ambulatory Visit (HOSPITAL_COMMUNITY): Payer: Self-pay

## 2022-01-24 ENCOUNTER — Other Ambulatory Visit: Payer: Self-pay

## 2022-01-24 ENCOUNTER — Ambulatory Visit: Payer: Medicare Other | Admitting: Physical Medicine and Rehabilitation

## 2022-01-24 ENCOUNTER — Encounter: Payer: Self-pay | Admitting: Physical Medicine and Rehabilitation

## 2022-01-24 VITALS — BP 133/62 | HR 89

## 2022-01-24 DIAGNOSIS — M4726 Other spondylosis with radiculopathy, lumbar region: Secondary | ICD-10-CM

## 2022-01-24 DIAGNOSIS — M47816 Spondylosis without myelopathy or radiculopathy, lumbar region: Secondary | ICD-10-CM | POA: Diagnosis not present

## 2022-01-24 DIAGNOSIS — M5416 Radiculopathy, lumbar region: Secondary | ICD-10-CM

## 2022-01-24 DIAGNOSIS — M5441 Lumbago with sciatica, right side: Secondary | ICD-10-CM

## 2022-01-24 DIAGNOSIS — G8929 Other chronic pain: Secondary | ICD-10-CM

## 2022-01-24 NOTE — Progress Notes (Signed)
Pt state lower back pain that travels down her right leg. Pt state any movement makes the pain worse. Pt state she uses pain patches and over the counter pain meds to help ease her pain. Pt state hx of inj on 01/05/22 pt state it didn't work. ? ?Numeric Pain Rating Scale and Functional Assessment ?Average Pain 8 ?Pain Right Now 6 ?My pain is intermittent, constant, dull, tingling, and aching ?Pain is worse with: walking, bending, sitting, standing, some activites, and laying down ?Pain improves with: medication and injections ? ? ?In the last MONTH (on 0-10 scale) has pain interfered with the following? ? ?1. General activity like being  able to carry out your everyday physical activities such as walking, climbing stairs, carrying groceries, or moving a chair?  ?Rating(6) ? ?2. Relation with others like being able to carry out your usual social activities and roles such as  activities at home, at work and in your community. ?Rating(7) ? ?3. Enjoyment of life such that you have  been bothered by emotional problems such as feeling anxious, depressed or irritable?  ?Rating(8) ? ?

## 2022-01-24 NOTE — Progress Notes (Signed)
Julie Mann - 76 y.o. female MRN [id number]  Date of birth: [birth year]  Office Visit Note: Visit Date: 01/24/2022 PCP: Merlene Laughter, MD Referred by: Merlene Laughter, MD  Subjective: Chief Complaint  Patient presents with   Lower Back - Pain   Right Leg - Pain   HPI: Julie Mann is a 76 y.o. female who comes in today for evaluation of chronic, worsening and severe right sided lower back pain radiating to buttock, lateral leg and down to foot. Patient reports greater than 50% pain relief for 3 days with recent right L5 transforaminal epidural steroid injection performed in our office, however her pain relief was short lived and her discomfort has gradually increased over the last week. Patient states pain is exacerbated by prolonged sitting, movement and activity, describes as constant sharp and sore sensation, currently rates as 7 out of 10. Patient reports some relief of pain with home exercise regimen, rest and use of OTC medications as needed. Patient reports she recently started acupuncture treatments with Morrell Riddle at [facility] and reports some relief of pain with these treatments. 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 Patients recent lumbar MRI exhibits moderate facet and ligamentum flavum hypertrophy at L4-L5, there is also small synovial cysts posterior to the facet joints bilaterally without impingement at this level. There is also disc space height loss and mild facet hypertrophy at L5-S1. No high grade spinal canal stenosis noted. Patient did sustain right ankle fracture during a fall in 2020 which was repaired by Dr. Aldean Baker with hardware removal in 2022. Patient denies focal weakness, numbness and tingling. Patient denies recent trauma or falls.    Review of Systems  Musculoskeletal:  Positive for back pain.  Neurological:  Negative for tingling, sensory change, focal weakness and weakness.  All other systems  reviewed and are negative. Otherwise per HPI.  Assessment & Plan: Visit Diagnoses:    ICD-10-CM   1. Lumbar radiculopathy  M54.16 Ambulatory referral to Physical Medicine Rehab    2. Chronic right-sided low back pain with right-sided sciatica  M54.41 Ambulatory referral to Physical Medicine Rehab   G89.29     3. Other spondylosis with radiculopathy, lumbar region  M47.26 Ambulatory referral to Physical Medicine Rehab    4. Facet hypertrophy of lumbar region  M47.816        Plan: Findings:  Chronic, worsening and severe right sided lower back pain radiating to buttock, lateral leg and down to foot. Patient did get greater than 50% pain relief with recent right L5 transforaminal epidural steroid injection, however pain relief was short lived. Patient continues to have severe pain despite good conservative therapies such as home exercise regimen, acupuncture, rest and OTC medications. Patient clinical presentation and exam continue to be consistent with L5 nerve pattern. We believe the next step is to perform a diagnostic and hopefully therapeutic right L5-S1 interlaminar epidural steroid injection under fluoroscopic guidance. Patient is not currently on long term anticoagulant therapy. I did discuss medication management with her today, however she does not wish to try any medications at this time as she had multiple intolerances. Patient encouraged to remain active and to continue home exercise regimen and acupuncture as tolerated. We feel that we can get patient in quickly for injection. No red flag symptoms noted upon exam today.    Meds & Orders: No orders of the defined types were placed in this encounter.   Orders Placed This Encounter  Procedures  Ambulatory referral to Physical Medicine Rehab    Follow-up: Return for RIght L5-S1 interlaminar epidural steroid injection.   Procedures: No procedures performed      Clinical History: No specialty comments available.   She reports  that she quit smoking about 18 years ago. Her smoking use included cigarettes. She has a 30.00 pack-year smoking history. She has never used smokeless tobacco. No results for input(s): HGBA1C, LABURIC in the last 8760 hours.  Objective:  VS:  HT:    WT:   BMI:     BP:133/62  HR:89bpm  TEMP: ( )  RESP:  Physical Exam Vitals and nursing note reviewed.  HENT:     Head: Normocephalic and atraumatic.     Right Ear: External ear normal.     Left Ear: External ear normal.     Nose: Nose normal.     Mouth/Throat:     Mouth: Mucous membranes are moist.  Eyes:     Extraocular Movements: Extraocular movements intact.  Cardiovascular:     Rate and Rhythm: Normal rate.     Pulses: Normal pulses.  Pulmonary:     Effort: Pulmonary effort is normal.  Abdominal:     General: Abdomen is flat. There is no distension.  Musculoskeletal:        General: Tenderness present.     Cervical back: Normal range of motion.     Comments: Pt rises from seated position to standing without difficulty. Good lumbar range of motion. Strong distal strength without clonus, no pain upon palpation of greater trochanters. Dysesthesias noted to right L5 dermatome. Sensation intact bilaterally. Walks independently, gait steady.   Skin:    General: Skin is warm and dry.     Capillary Refill: Capillary refill takes less than 2 seconds.  Neurological:     General: No focal deficit present.     Mental Status: She is alert and oriented to person, place, and time.  Psychiatric:        Mood and Affect: Mood normal.        Behavior: Behavior normal.    Ortho Exam  Imaging: No results found.  Past Medical/Family/Surgical/Social History: Medications & Allergies reviewed per EMR, new medications updated. 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/27/2021   Hardware complicating wound infection (HCC)    Neck pain 10/05/2020   Nodule of upper lobe of right lung 08/17/2020   Biceps tendonosis of left shoulder 02/13/2020   Impingement syndrome of left shoulder 02/13/2020   Post-operative state 02/13/2020   Ankle dislocation, right, initial encounter 03/10/2019   Trimalleolar fracture of ankle, closed, right, initial encounter    Chronic left shoulder pain 02/19/2019   Anxiety 12/17/2016   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  stairs (If Iam tired)   Dysrhythmia    PAF   Fall from horse 01/2018   Fracture, ankle 02/2019   Right    High cholesterol    History of stomach ulcers ~ 1966   Osteopenia    Family History  Problem Relation Age of Onset   Breast cancer Maternal Aunt    Breast cancer Maternal Grandmother    Heart disease Mother    Stroke Father    Rectal cancer Paternal Grandmother    Breast cancer Sister 18   Breast cancer Sister 14   Past Surgical History:  Procedure Laterality Date   ANUS SURGERY  2005   "biopsy"   BREAST CYST EXCISION Right 1966   DILATION AND CURETTAGE OF UTERUS  1980's   S/P miscarriage   EXTERNAL FIXATION LEG Right 03/10/2019   Procedure: OPEN REDUCTION INTERNAL FIXATION RIGHT ANKLE;  Surgeon: Teryl Lucy, MD;  Location: [facility] OR;  Service: Orthopedics;  Laterality: Right;   HARDWARE REMOVAL Right 02/05/2021   Procedure: REMOVAL OF  HARDWARE RIGHT ANKLE;  Surgeon: Nadara Mustard, MD;  [facility] OR;  Service: Orthopedics;  Laterality: Right;   VARICOSE VEIN SURGERY Left    left leg   VIDEO BRONCHOSCOPY WITH ENDOBRONCHIAL NAVIGATION Right 08/25/2020   Procedure: VIDEO BRONCHOSCOPY WITH ENDOBRONCHIAL NAVIGATION WITH FIDUCIAL PLACEMENT;  Surgeon: Josephine Igo, DO;  Location: [facility] OR;  Service: Pulmonary;  Laterality: Right;   Social History   Occupational History   Not on file  Tobacco Use   Smoking status: Former    Packs/day: 1.00    Years: 30.00    Pack years: 30.00    Types: Cigarettes    Quit date: 2005    Years since quitting: 18.2   Smokeless tobacco: Never  Vaping Use   Vaping Use: Never used  Substance and Sexual Activity   Alcohol use: No   Drug use: No   Sexual activity: Not Currently    Partners: Male    Birth control/protection: Post-menopausal

## 2022-01-27 ENCOUNTER — Other Ambulatory Visit: Payer: Self-pay | Admitting: Obstetrics & Gynecology

## 2022-01-27 ENCOUNTER — Other Ambulatory Visit (HOSPITAL_COMMUNITY): Payer: Self-pay

## 2022-01-28 ENCOUNTER — Other Ambulatory Visit (HOSPITAL_COMMUNITY): Payer: Self-pay

## 2022-01-28 MED ORDER — IBUPROFEN 600 MG PO TABS
600.0000 mg | ORAL_TABLET | Freq: Every day | ORAL | 1 refills | Status: DC | PRN
  Filled 2022-01-28: qty 30, 30d supply, fill #0
  Filled 2022-03-14: qty 30, 30d supply, fill #1

## 2022-02-10 ENCOUNTER — Ambulatory Visit: Payer: Self-pay

## 2022-02-10 ENCOUNTER — Encounter: Payer: Self-pay | Admitting: Physical Medicine and Rehabilitation

## 2022-02-10 ENCOUNTER — Ambulatory Visit: Payer: Medicare Other | Admitting: Physical Medicine and Rehabilitation

## 2022-02-10 VITALS — BP 157/75 | HR 88

## 2022-02-10 DIAGNOSIS — M5416 Radiculopathy, lumbar region: Secondary | ICD-10-CM

## 2022-02-10 MED ORDER — METHYLPREDNISOLONE ACETATE 80 MG/ML IJ SUSP
80.0000 mg | Freq: Once | INTRAMUSCULAR | Status: AC
  Administered 2022-02-10: 80 mg

## 2022-02-10 NOTE — Progress Notes (Signed)
Pt state lower back pain that travels down her right leg. Pt state any movement makes the pain worse. Pt state she uses pain patches and over the counter pain meds to help ease her pain. ? ? ?Numeric Pain Rating Scale and Functional Assessment ?Average Pain 9 ? ? ?In the last MONTH (on 0-10 scale) has pain interfered with the following? ? ?1. General activity like being  able to carry out your everyday physical activities such as walking, climbing stairs, carrying groceries, or moving a chair?  ?Rating(10) ? ? ?+Driver, -BT, +Dye Allergies. ? ?

## 2022-02-10 NOTE — Patient Instructions (Signed)

## 2022-02-13 NOTE — Procedures (Signed)
Lumbar Epidural Steroid Injection - Interlaminar Approach with Fluoroscopic Guidance ? ?Patient: Julie Mann [facility]      ?Date of Birth: [birth year] ?MRN: [id number] ?PCP: Lajean Manes, MD      ?Visit Date: 02/10/2022 ?  ?Universal Protocol:    ? ?Consent Given By: the patient ? ?Position: PRONE ? ?Additional Comments: ?Vital signs were monitored before and after the procedure. ?Patient was prepped and draped in the usual sterile fashion. ?The correct patient, procedure, and site was verified. ? ? ?Injection Procedure Details:  ? ?Procedure diagnoses: Lumbar radiculopathy [M54.16]  ? ?Meds Administered:  ?Meds ordered this encounter  ?Medications  ? methylPREDNISolone acetate (DEPO-MEDROL) injection 80 mg  ?  ? ?Laterality: Right ? ?Location/Site:  L5-S1 ? ?Needle: 3.5 in., 20 ga. Tuohy ? ?Needle Placement: Paramedian epidural ? ?Findings:  ? -Comments: Excellent flow of contrast into the epidural space. ? ?Procedure Details: ?Using a paramedian approach from the side mentioned above, the region overlying the inferior lamina was localized under fluoroscopic visualization and the soft tissues overlying this structure were infiltrated with 4 ml. of 1% Lidocaine without Epinephrine. The Tuohy needle was inserted into the epidural space using a paramedian approach.  ? ?The epidural space was localized using loss of resistance along with counter oblique bi-planar fluoroscopic views.  After negative aspirate for air, blood, and CSF, a 2 ml. volume of Isovue-250 was injected into the epidural space and the flow of contrast was observed. Radiographs were obtained for documentation purposes.   ? ?The injectate was administered into the level noted above. ? ? ?Additional Comments:  ?The patient tolerated the procedure well ?Dressing: 2 x 2 sterile gauze and Band-Aid ?  ? ?Post-procedure details: ?Patient was observed during the procedure. ?Post-procedure instructions were reviewed. ? ?Patient left the clinic in stable condition. ?

## 2022-02-13 NOTE — Progress Notes (Signed)
? ?[name] - 76 y.o. female MRN [id number]  Date of birth: 11-Mar-1946 ? ?Office Visit Note: ?Visit Date: 02/10/2022 ?PCP: Lajean Manes, MD ?Referred by: Lajean Manes, MD ? ?Subjective: ?Chief Complaint  ?Patient presents with  ? Lower Back - Pain  ? Right Leg - Pain  ? ?HPI:  Julie Mann is a 76 y.o. female who comes in today at the request of Barnet Pall, FNP for planned Right L5-S1 Lumbar Interlaminar epidural steroid injection with fluoroscopic guidance.  The patient has failed conservative care including home exercise, medications, time and activity modification.  This injection will be diagnostic and hopefully therapeutic.  Please see requesting physician notes for further details and justification. ? ?ROS Otherwise per HPI. ? ?Assessment & Plan: ?Visit Diagnoses:  ?  ICD-10-CM   ?1. Lumbar radiculopathy  M54.16 XR C-ARM NO REPORT  ?  Epidural Steroid injection  ?  methylPREDNISolone acetate (DEPO-MEDROL) injection 80 mg  ?  ?  ?Plan: No additional findings.  ? ?Meds & Orders:  ?Meds ordered this encounter  ?Medications  ? methylPREDNISolone acetate (DEPO-MEDROL) injection 80 mg  ?  ?Orders Placed This Encounter  ?Procedures  ? XR C-ARM NO REPORT  ? Epidural Steroid injection  ?  ?Follow-up: Return if symptoms worsen or fail to improve.  ? ?Procedures: ?No procedures performed  ?Lumbar Epidural Steroid Injection - Interlaminar Approach with Fluoroscopic Guidance ? ?Patient: Julie Mann [facility]      ?Date of Birth: Feb 02, 1946 ?MRN: [id number] ?PCP: Lajean Manes, MD      ?Visit Date: 02/10/2022 ?  ?Universal Protocol:    ? ?Consent Given By: the patient ? ?Position: PRONE ? ?Additional Comments: ?Vital signs were monitored before and after the procedure. ?Patient was prepped and draped in the usual sterile fashion. ?The correct patient, procedure, and site was verified. ? ? ?Injection Procedure Details:  ? ?Procedure diagnoses: Lumbar radiculopathy [M54.16]  ? ?Meds Administered:  ?Meds ordered this  encounter  ?Medications  ? methylPREDNISolone acetate (DEPO-MEDROL) injection 80 mg  ?  ? ?Laterality: Right ? ?Location/Site:  L5-S1 ? ?Needle: 3.5 in., 20 ga. Tuohy ? ?Needle Placement: Paramedian epidural ? ?Findings:  ? -Comments: Excellent flow of contrast into the epidural space. ? ?Procedure Details: ?Using a paramedian approach from the side mentioned above, the region overlying the inferior lamina was localized under fluoroscopic visualization and the soft tissues overlying this structure were infiltrated with 4 ml. of 1% Lidocaine without Epinephrine. The Tuohy needle was inserted into the epidural space using a paramedian approach.  ? ?The epidural space was localized using loss of resistance along with counter oblique bi-planar fluoroscopic views.  After negative aspirate for air, blood, and CSF, a 2 ml. volume of Isovue-250 was injected into the epidural space and the flow of contrast was observed. Radiographs were obtained for documentation purposes.   ? ?The injectate was administered into the level noted above. ? ? ?Additional Comments:  ?The patient tolerated the procedure well ?Dressing: 2 x 2 sterile gauze and Band-Aid ?  ? ?Post-procedure details: ?Patient was observed during the procedure. ?Post-procedure instructions were reviewed. ? ?Patient left the clinic in stable condition.  ? ?Clinical History: ?EXAM: ?MRI LUMBAR SPINE WITHOUT CONTRAST ?  ?TECHNIQUE: ?Multiplanar, multisequence MR imaging of the lumbar spine was ?performed. No intravenous contrast was administered. ?  ?COMPARISON:  Lumbar spine MRI 05/30/2012 ?  ?FINDINGS: ?Segmentation:  Standard. ?  ?Alignment: Slight lower lumbar levoscoliosis. No significant ?listhesis. ?  ?Vertebrae: No fracture or suspicious marrow  lesion. Mild ?degenerative endplate edema at Y4-M2. ?  ?Conus medullaris and cauda equina: Conus extends to the T12 level ?and is suboptimally evaluated as it was not covered on axial images. ?Unremarkable appearance of  the cauda equina. ?  ?Paraspinal and other soft tissues: Small renal cysts. ?  ?Disc levels: ?  ?Disc desiccation throughout the lumbar spine with exception of L3-4. ?Progressive, severe disc space narrowing at L5-S1. ?  ?L1-2: Negative. ?  ?L2-3: At most minimal disc bulging without stenosis. ?  ?L3-4: Minimal leftward disc bulging without stenosis. ?  ?L4-5: Disc bulging and moderate facet and ligamentum flavum ?hypertrophy, mildly progressed but without associated stenosis. ?Small synovial cysts posterior to the facet joints bilaterally, not ?in a position to cause neural impingement. ?  ?L5-S1: Disc bulging, endplate spurring, disc space height loss, and ?mild facet hypertrophy without significant stenosis. ?  ?IMPRESSION: ?1. Mild progression advanced disc degeneration at L5-S1 without ?stenosis. ?2. Mildly progressive facet hypertrophy at L4-5 without stenosis. ?  ?  ?Electronically Signed ?  By: Logan Bores M.D. ?  On: 12/01/2021 11:05  ? ? ? ?Objective:  VS:  HT:    WT:   BMI:     BP:(!) 157/75  HR:88bpm  TEMP: ( )  RESP:  ?Physical Exam ?Vitals and nursing note reviewed.  ?Constitutional:   ?   General: She is not in acute distress. ?   Appearance: Normal appearance. She is not ill-appearing.  ?HENT:  ?   Head: Normocephalic and atraumatic.  ?   Right Ear: External ear normal.  ?   Left Ear: External ear normal.  ?Eyes:  ?   Extraocular Movements: Extraocular movements intact.  ?Cardiovascular:  ?   Rate and Rhythm: Normal rate.  ?   Pulses: Normal pulses.  ?Pulmonary:  ?   Effort: Pulmonary effort is normal. No respiratory distress.  ?Abdominal:  ?   General: There is no distension.  ?   Palpations: Abdomen is soft.  ?Musculoskeletal:     ?   General: Tenderness present.  ?   Cervical back: Neck supple.  ?   Right lower leg: No edema.  ?   Left lower leg: No edema.  ?   Comments: Patient has good distal strength with no pain over the greater trochanters.  No clonus or focal weakness.  ?Skin: ?    Findings: No erythema, lesion or rash.  ?Neurological:  ?   General: No focal deficit present.  ?   Mental Status: She is alert and oriented to person, place, and time.  ?   Sensory: No sensory deficit.  ?   Motor: No weakness or abnormal muscle tone.  ?   Coordination: Coordination normal.  ?Psychiatric:     ?   Mood and Affect: Mood normal.     ?   Behavior: Behavior normal.  ?  ? ?Imaging: ?No results found. ?

## 2022-02-15 ENCOUNTER — Other Ambulatory Visit (HOSPITAL_COMMUNITY): Payer: Self-pay

## 2022-02-15 ENCOUNTER — Other Ambulatory Visit: Payer: Self-pay | Admitting: Obstetrics & Gynecology

## 2022-02-15 DIAGNOSIS — F419 Anxiety disorder, unspecified: Secondary | ICD-10-CM

## 2022-02-16 ENCOUNTER — Other Ambulatory Visit (HOSPITAL_COMMUNITY): Payer: Self-pay

## 2022-02-16 MED ORDER — LORAZEPAM 1 MG PO TABS
1.0000 mg | ORAL_TABLET | Freq: Every evening | ORAL | 0 refills | Status: DC | PRN
  Filled 2022-02-16: qty 30, 30d supply, fill #0

## 2022-02-22 ENCOUNTER — Telehealth: Payer: Self-pay

## 2022-02-22 ENCOUNTER — Telehealth: Payer: Self-pay | Admitting: Physical Medicine and Rehabilitation

## 2022-02-22 NOTE — Telephone Encounter (Signed)
Spoke with patient via telephone, patient is concerned that injection caused her to have allergic reaction for the last week and a half. Patient reassured that this is likely not allergic reaction. Patient does have history of GI issues, she reports diarrhea and upset stomach since injection. I did instruct her to follow up with PCP and GI specialist as this could be an exacerbation of her chronic condition.  ?

## 2022-02-22 NOTE — Telephone Encounter (Signed)
Patient contacted the office and states that she received an injection on 02/10/2022 however states that she feels like she has been having a allergic reaction. Patient states that she would like to know what she is able to do for pain relief since injection has wore off.  ? ?[phone] (home)  ? ?

## 2022-02-24 ENCOUNTER — Other Ambulatory Visit (HOSPITAL_COMMUNITY): Payer: Self-pay

## 2022-02-24 ENCOUNTER — Other Ambulatory Visit (HOSPITAL_BASED_OUTPATIENT_CLINIC_OR_DEPARTMENT_OTHER): Payer: Self-pay

## 2022-02-24 DIAGNOSIS — M5431 Sciatica, right side: Secondary | ICD-10-CM | POA: Diagnosis not present

## 2022-02-24 MED ORDER — GABAPENTIN 100 MG PO CAPS
ORAL_CAPSULE | ORAL | 3 refills | Status: DC
  Filled 2022-02-24 (×2): qty 60, 30d supply, fill #0

## 2022-03-02 DIAGNOSIS — X32XXXD Exposure to sunlight, subsequent encounter: Secondary | ICD-10-CM | POA: Diagnosis not present

## 2022-03-02 DIAGNOSIS — L570 Actinic keratosis: Secondary | ICD-10-CM | POA: Diagnosis not present

## 2022-03-02 DIAGNOSIS — L700 Acne vulgaris: Secondary | ICD-10-CM | POA: Diagnosis not present

## 2022-03-09 ENCOUNTER — Encounter: Payer: Self-pay | Admitting: Orthopaedic Surgery

## 2022-03-09 ENCOUNTER — Ambulatory Visit (INDEPENDENT_AMBULATORY_CARE_PROVIDER_SITE_OTHER): Payer: Medicare Other

## 2022-03-09 ENCOUNTER — Ambulatory Visit: Payer: Medicare Other | Admitting: Orthopaedic Surgery

## 2022-03-09 DIAGNOSIS — R102 Pelvic and perineal pain: Secondary | ICD-10-CM

## 2022-03-09 DIAGNOSIS — M4726 Other spondylosis with radiculopathy, lumbar region: Secondary | ICD-10-CM

## 2022-03-09 DIAGNOSIS — M545 Low back pain, unspecified: Secondary | ICD-10-CM | POA: Insufficient documentation

## 2022-03-09 NOTE — Progress Notes (Signed)
? ?Office Visit Note ?  ?Patient: Julie Mann [facility]           ?Date of Birth: Mar 22, 1946           ?MRN: [id number] ?Visit Date: 03/09/2022 ?             ?Requested by: Lajean Manes, MD ?[address] ?[address] ?[place],  [place] [address] ?PCP: Lajean Manes, MD ? ? ?Assessment & Plan: ?Visit Diagnoses:  ?1. Pelvic pain   ?2. Other spondylosis with radiculopathy, lumbar region   ? ? ?Plan: Paullette has had chronic low back pain for 4 to 6 months.  She has been evaluated by Dr. Sharol Given with an MRI scan in January demonstrating mild progression of advanced disc degeneration at L5-S1 without stenosis and mildly progressive facet hypertrophy at L4-5 without stenosis.  She has been seen by Dr. Ernestina Patches on 2 occasions for an epidural steroid injection and relates that she is not had any relief of her pain in fact may be a little bit worse.  She also has developed diarrhea since her last cortisone injection that started quickly after the injection and continues..  Her pain originates in her back radiates into her right lower extremity as far distally as her foot and she is also had some pain along the ischial tuberosities.  She has tried ibuprofen and Aleve which has not provided much relief.  Her primary care physician prescribed gabapentin which also gave her diarrhea. she is obviously concerned about prednisone and cortisone because of her side effects.  She has multiple allergies to medicines.  I have suggested that she check with Dr. Felipa Eth for the chronic diarrhea and I will obtain an MRI scan of her pelvis to see if there is any abnormality other than the  obvious etiology of the lumbar spine that may be causing her discomfort.  She is tender over both ischial tuberosities. ?I did review her lumbosacral spine films and she certainly has some degenerative changes at L4-5 and L5-S1 and narrowing of the L5-S1 disc space.  There is diffuse calcification of the abdominal aorta but without obvious aneurysmal dilatation.  AP  of the pelvis did not demonstrate any hip pathology but there is sclerosis about the SI joints. ?Punam has an appointment with a neurosurgeon tomorrow and I think it is a good idea to keep the appointment. ? ?Follow-Up Instructions: Return After MRI scan pelvis.  ? ?Orders:  ?Orders Placed This Encounter  ?Procedures  ? MR Pelvis w/o contrast  ? ?No orders of the defined types were placed in this encounter. ? ? ? ? Procedures: ?No procedures performed ? ? ?Clinical Data: ?No additional findings. ? ? ?Subjective: ?Chief Complaint  ?Patient presents with  ? Lower Back - Follow-up  ?Linh visits the office second opinion regarding chronic problem with her back.  She first noted onset of pain about 5 or so months ago.  She was seeing Dr. Sharol Given in follow-up for her right ankle fracture and related that she was having some back and right lower extremity discomfort.  He ordered an MRI scan which is addended to the chart demonstrating some degenerative changes.  She was referred to Dr. Ernestina Patches for further evaluation has had 2 epidural steroid injections without much relief.  She is also developed diarrhea from the cortisone injection.  Pain originates in the low back radiates into her right buttock and into her right leg.  She does have trouble sitting.  She is not having any groin  pain ? ?HPI ? ?Review of Systems ? ? ?Objective: ?Vital Signs: LMP 11/14/1992  ? ?Physical Exam ?Constitutional:   ?   Appearance: She is well-developed.  ?Pulmonary:  ?   Effort: Pulmonary effort is normal.  ?Skin: ?   General: Skin is warm and dry.  ?Neurological:  ?   Mental Status: She is alert and oriented to person, place, and time.  ?Psychiatric:     ?   Behavior: Behavior normal.  ? ? ?Ortho Exam awake alert and oriented x3.  Comfortable signs sitting on a cushion examining table but does have tenderness over the ischial tuberosities bilaterally.  Straight leg raise negative.  Painless range of motion both hips.  She does have some areas of  tenderness in the paralumbar region more on the right than the left but no masses.  Motor exam intact.  Good strength.  No flank pain. ? ?Specialty Comments:  ?EXAM: ?MRI LUMBAR SPINE WITHOUT CONTRAST ?  ?TECHNIQUE: ?Multiplanar, multisequence MR imaging of the lumbar spine was ?performed. No intravenous contrast was administered. ?  ?COMPARISON:  Lumbar spine MRI 05/30/2012 ?  ?FINDINGS: ?Segmentation:  Standard. ?  ?Alignment: Slight lower lumbar levoscoliosis. No significant ?listhesis. ?  ?Vertebrae: No fracture or suspicious marrow lesion. Mild ?degenerative endplate edema at Z3-G6. ?  ?Conus medullaris and cauda equina: Conus extends to the T12 level ?and is suboptimally evaluated as it was not covered on axial images. ?Unremarkable appearance of the cauda equina. ?  ?Paraspinal and other soft tissues: Small renal cysts. ?  ?Disc levels: ?  ?Disc desiccation throughout the lumbar spine with exception of L3-4. ?Progressive, severe disc space narrowing at L5-S1. ?  ?L1-2: Negative. ?  ?L2-3: At most minimal disc bulging without stenosis. ?  ?L3-4: Minimal leftward disc bulging without stenosis. ?  ?L4-5: Disc bulging and moderate facet and ligamentum flavum ?hypertrophy, mildly progressed but without associated stenosis. ?Small synovial cysts posterior to the facet joints bilaterally, not ?in a position to cause neural impingement. ?  ?L5-S1: Disc bulging, endplate spurring, disc space height loss, and ?mild facet hypertrophy without significant stenosis. ?  ?IMPRESSION: ?1. Mild progression advanced disc degeneration at L5-S1 without ?stenosis. ?2. Mildly progressive facet hypertrophy at L4-5 without stenosis. ?  ?  ?Electronically Signed ?  By: Logan Bores M.D. ?  On: 12/01/2021 11:05 ? ?Imaging: ?No results found. ? ? ?PMFS History: ?Patient Active Problem List  ? Diagnosis Date Noted  ? Low back pain 03/09/2022  ? Pelvic pain 03/09/2022  ? 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 PVD (posterior vitreous detachment)  07/27/2021  ? Pseudophakia of right eye 07/27/2021  ? Pseudophakia of left eye 07/27/2021  ? Vitreomacular adhesion of right eye 07/27/2021  ? Macular pucker, right eye 07/27/2021  ? Hardware complicating wound infection ([place])   ? Neck pain 10/05/2020  ? Nodule of upper lobe of right lung 08/17/2020  ? Biceps tendonosis of left shoulder 02/13/2020  ? Impingement syndrome of left shoulder 02/13/2020  ? Post-operative state 02/13/2020  ? Ankle dislocation, right, initial encounter 03/10/2019  ? Trimalleolar fracture of ankle, closed, right, initial encounter   ? Chronic left shoulder pain 02/19/2019  ? Anxiety 12/17/2016  ? 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 Osteopenia 12/17/2016  ? Paroxysmal atrial fibrillation ([place]) 12/17/2016  ? Pure hypercholesterolemia 12/17/2016  ? Rectal cancer ([place]) 07/30/2015  ? ?Past Medical History:  ?Diagnosis Date  ? 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 Anxiety   ?  Arthritis   ? "hands, fingers, back" (02/27/2014)  ? Atrial fibrillation ([place])   ? Cat scratch of right hand 02/24/2014  ? COPD (chronic obstructive pulmonary disease) ([place])   ? Dyspnea   ? walking up hill;.  stairs ([place])  ? Dysrhythmia   ? PAF  ? Fall from horse 01/2018  ? Fracture, ankle 02/2019  ? Right   ? High cholesterol   ? History of stomach ulcers ~ 1966  ? Osteopenia   ?  ?Family History  ?Problem Relation Age of Onset  ? Breast cancer Maternal Aunt   ? Breast cancer Maternal Grandmother   ? Heart disease Mother   ? Stroke Father   ? Rectal cancer Paternal Grandmother   ? Breast cancer Sister 85  ? Breast cancer Sister 26  ?  ?Past Surgical History:  ?Procedure Laterality Date  ? ANUS SURGERY  2005  ? "biopsy"  ? BREAST CYST EXCISION Right 1966  ? DILATION AND CURETTAGE OF UTERUS  1980's  ? S/P miscarriage  ? EXTERNAL FIXATION LEG Right 03/10/2019  ? Procedure: OPEN REDUCTION INTERNAL FIXATION RIGHT ANKLE;  Surgeon: Marchia Bond, MD;  Location: [facility];  Service:  Orthopedics;  Laterality: Right;  ? HARDWARE REMOVAL Right 02/05/2021  ? Procedure: REMOVAL OF HARDWARE RIGHT ANKLE;  Surgeon: Newt Minion, MD;  Location: [facility];  Service: Orthopedics;  Laterality: Right;

## 2022-03-10 DIAGNOSIS — G5701 Lesion of sciatic nerve, right lower limb: Secondary | ICD-10-CM | POA: Diagnosis not present

## 2022-03-10 DIAGNOSIS — M5416 Radiculopathy, lumbar region: Secondary | ICD-10-CM | POA: Diagnosis not present

## 2022-03-10 DIAGNOSIS — M7061 Trochanteric bursitis, right hip: Secondary | ICD-10-CM | POA: Diagnosis not present

## 2022-03-11 ENCOUNTER — Other Ambulatory Visit (HOSPITAL_COMMUNITY): Payer: Self-pay

## 2022-03-11 MED ORDER — ATORVASTATIN CALCIUM 10 MG PO TABS
10.0000 mg | ORAL_TABLET | ORAL | 3 refills | Status: DC
  Filled 2022-03-11: qty 23, 80d supply, fill #0
  Filled 2022-05-31: qty 23, 80d supply, fill #1
  Filled 2022-08-08: qty 23, 80d supply, fill #2
  Filled 2022-11-11: qty 23, 80d supply, fill #3

## 2022-03-14 ENCOUNTER — Other Ambulatory Visit (HOSPITAL_COMMUNITY): Payer: Self-pay

## 2022-03-14 DIAGNOSIS — M7061 Trochanteric bursitis, right hip: Secondary | ICD-10-CM | POA: Diagnosis not present

## 2022-03-14 DIAGNOSIS — M5416 Radiculopathy, lumbar region: Secondary | ICD-10-CM | POA: Diagnosis not present

## 2022-03-15 DIAGNOSIS — M5431 Sciatica, right side: Secondary | ICD-10-CM | POA: Diagnosis not present

## 2022-03-16 ENCOUNTER — Other Ambulatory Visit (HOSPITAL_COMMUNITY): Payer: Self-pay

## 2022-03-20 ENCOUNTER — Ambulatory Visit
Admission: RE | Admit: 2022-03-20 | Discharge: 2022-03-20 | Disposition: A | Discharge status: Home / Self Care | Payer: Medicare Other | Source: Ambulatory Visit | Attending: Orthopaedic Surgery | Admitting: Orthopaedic Surgery

## 2022-03-20 DIAGNOSIS — M47817 Spondylosis without myelopathy or radiculopathy, lumbosacral region: Secondary | ICD-10-CM | POA: Diagnosis not present

## 2022-03-20 DIAGNOSIS — K5730 Diverticulosis of large intestine without perforation or abscess without bleeding: Secondary | ICD-10-CM | POA: Diagnosis not present

## 2022-03-20 DIAGNOSIS — M4726 Other spondylosis with radiculopathy, lumbar region: Secondary | ICD-10-CM

## 2022-03-20 DIAGNOSIS — Z85048 Personal history of other malignant neoplasm of rectum, rectosigmoid junction, and anus: Secondary | ICD-10-CM | POA: Diagnosis not present

## 2022-03-24 ENCOUNTER — Ambulatory Visit: Payer: Medicare Other | Admitting: Orthopaedic Surgery

## 2022-03-24 ENCOUNTER — Encounter: Payer: Self-pay | Admitting: Orthopaedic Surgery

## 2022-03-24 ENCOUNTER — Other Ambulatory Visit (HOSPITAL_COMMUNITY): Payer: Self-pay

## 2022-03-24 DIAGNOSIS — M5441 Lumbago with sciatica, right side: Secondary | ICD-10-CM | POA: Diagnosis not present

## 2022-03-24 DIAGNOSIS — G8929 Other chronic pain: Secondary | ICD-10-CM

## 2022-03-24 DIAGNOSIS — R102 Pelvic and perineal pain: Secondary | ICD-10-CM

## 2022-03-24 MED ORDER — TRAMADOL HCL 50 MG PO TABS
50.0000 mg | ORAL_TABLET | Freq: Two times a day (BID) | ORAL | 0 refills | Status: DC | PRN
  Filled 2022-03-24: qty 14, 7d supply, fill #0

## 2022-03-24 NOTE — Progress Notes (Signed)
? ?Office Visit Note ?  ?Patient: Julie Mann [facility]           ?Date of Birth: Mar 25, 1946           ?MRN: [id number] ?Visit Date: 03/24/2022 ?             ?Requested by: Lajean Manes, MD ?[address] ?[address] ?[place],  [place] [address] ?PCP: Lajean Manes, MD ? ? ?Assessment & Plan: ?Visit Diagnoses:  ?1. Chronic right-sided low back pain with right-sided sciatica   ?2. Pelvic pain   ? ? ?Plan: Tamea had an MRI of her pelvis to supplement the MRI scan of her lumbar spine that she had earlier in the year.  The study demonstrated mild pubic symphysis osteoarthritis and mild bilateral femoral acetabular cartilage thinning.  She is not symptomatic in any of those areas.  Her pain continues in the lower lumbar spine to the right radiating into her buttock and is far distally as her right ankle and foot.  She had an MRI of her lumbar spine in January revealing mild progression of advanced disc degeneration L5-S1 without stenosis and mildly progressive facet hypertrophy at L4-5 without stenosis.  She had small synovial cyst posterior the facet joints bilaterally but apparently not in a position to cause neural impingement.  She has been seen by Dr. Ernestina Patches and had a recent cortisone injection which did not alleviate her pain.  She relates that she is "allergic" to either prednisone and cortisone as it makes her feel terrible.  It appears that her problem originates from the lower lumbar spine at either L4-5 or L5-S1 or both.  Most of her pain is on the right side.  She is tried some medicines and recently tramadol that seem to make a difference so we will give her some more.  She has an appointment to see Dr. Ronnald Ramp of neurosurgery in 2 weeks she is very interested in having surgery as she has already tried therapy and medicines and time and she is really miserable.  Neurologically intact ? ?Follow-Up Instructions: Return if symptoms worsen or fail to improve.  ? ?Orders:  ?No orders of the defined types were placed in  this encounter. ? ?No orders of the defined types were placed in this encounter. ? ? ? ? Procedures: ?No procedures performed ? ? ?Clinical Data: ?No additional findings. ? ? ?Subjective: ?Chief Complaint  ?Patient presents with  ? Lower Back - Follow-up  ?  MRI review  ?Patient presents today for follow up on her lower back and pelvis. She had an MRI and is here today to discuss those results.  No change in symptoms with predominant low back pain right lower extremity radiculopathy ? ?HPI ? ?Review of Systems ? ? ?Objective: ?Vital Signs: LMP 11/14/1992  ? ?Physical Exam ?Constitutional:   ?   Appearance: She is well-developed.  ?Eyes:  ?   Pupils: Pupils are equal, round, and reactive to light.  ?Pulmonary:  ?   Effort: Pulmonary effort is normal.  ?Skin: ?   General: Skin is warm and dry.  ?Neurological:  ?   Mental Status: She is alert and oriented to person, place, and time.  ?Psychiatric:     ?   Behavior: Behavior normal.  ? ? ?Ortho Exam awake alert and oriented x3 comfortable sitting.  No acute distress.  Walks without a limp.  She does have percussible tenderness at the lumbosacral junction to the right but no masses.  No lateral hip pain.  Painless range of motion of both hips without any loss of motion.  Straight leg raise negative.  No knee pain.  Motor and sensory exam intact ? ?Specialty Comments:  ?EXAM: ?MRI LUMBAR SPINE WITHOUT CONTRAST ?  ?TECHNIQUE: ?Multiplanar, multisequence MR imaging of the lumbar spine was ?performed. No intravenous contrast was administered. ?  ?COMPARISON:  Lumbar spine MRI 05/30/2012 ?  ?FINDINGS: ?Segmentation:  Standard. ?  ?Alignment: Slight lower lumbar levoscoliosis. No significant ?listhesis. ?  ?Vertebrae: No fracture or suspicious marrow lesion. Mild ?degenerative endplate edema at U9-N2. ?  ?Conus medullaris and cauda equina: Conus extends to the T12 level ?and is suboptimally evaluated as it was not covered on axial images. ?Unremarkable appearance of the cauda  equina. ?  ?Paraspinal and other soft tissues: Small renal cysts. ?  ?Disc levels: ?  ?Disc desiccation throughout the lumbar spine with exception of L3-4. ?Progressive, severe disc space narrowing at L5-S1. ?  ?L1-2: Negative. ?  ?L2-3: At most minimal disc bulging without stenosis. ?  ?L3-4: Minimal leftward disc bulging without stenosis. ?  ?L4-5: Disc bulging and moderate facet and ligamentum flavum ?hypertrophy, mildly progressed but without associated stenosis. ?Small synovial cysts posterior to the facet joints bilaterally, not ?in a position to cause neural impingement. ?  ?L5-S1: Disc bulging, endplate spurring, disc space height loss, and ?mild facet hypertrophy without significant stenosis. ?  ?IMPRESSION: ?1. Mild progression advanced disc degeneration at L5-S1 without ?stenosis. ?2. Mildly progressive facet hypertrophy at L4-5 without stenosis. ?  ?  ?Electronically Signed ?  By: Logan Bores M.D. ?  On: 12/01/2021 11:05 ? ?Imaging: ?No results found. ? ? ?PMFS History: ?Patient Active Problem List  ? Diagnosis Date Noted  ? Low back pain 03/09/2022  ? Pelvic pain 03/09/2022  ? 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 PVD (posterior vitreous detachment) 07/27/2021  ? Pseudophakia of right eye 07/27/2021  ? Pseudophakia of left eye 07/27/2021  ? Vitreomacular adhesion of right eye 07/27/2021  ? Macular pucker, right eye 07/27/2021  ? Hardware complicating wound infection ([place])   ? Neck pain 10/05/2020  ? Nodule of upper lobe of right lung 08/17/2020  ? Biceps tendonosis of left shoulder 02/13/2020  ? Impingement syndrome of left shoulder 02/13/2020  ? Post-operative state 02/13/2020  ? Ankle dislocation, right, initial encounter 03/10/2019  ? Trimalleolar fracture of ankle, closed, right, initial encounter   ? Chronic left shoulder pain 02/19/2019  ? Anxiety 12/17/2016  ? 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 Osteopenia 12/17/2016  ? Paroxysmal atrial fibrillation ([place]) 12/17/2016  ? Pure  hypercholesterolemia 12/17/2016  ? Rectal cancer ([place]) 07/30/2015  ? ?Past Medical History:  ?Diagnosis Date  ? 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 Anxiety   ? Arthritis   ? "hands, fingers, back" (02/27/2014)  ? Atrial fibrillation ([place])   ? Cat scratch of right hand 02/24/2014  ? COPD (chronic obstructive pulmonary disease) ([place])   ? Dyspnea   ? walking up hill;.  stairs (If [place]am tired)  ? Dysrhythmia   ? PAF  ? Fall from horse 01/2018  ? Fracture, ankle 02/2019  ? Right   ? High cholesterol   ? History of stomach ulcers ~ 1966  ? Osteopenia   ?  ?Family History  ?Problem Relation Age of Onset  ? Breast cancer Maternal Aunt   ? Breast cancer Maternal Grandmother   ? Heart disease Mother   ? Stroke Father   ? Rectal  cancer Paternal Grandmother   ? Breast cancer Sister 31  ? Breast cancer Sister 77  ?  ?Past Surgical History:  ?Procedure Laterality Date  ? ANUS SURGERY  2005  ? "biopsy"  ? BREAST CYST EXCISION Right 1966  ? DILATION AND CURETTAGE OF UTERUS  1980's  ? S/P miscarriage  ? EXTERNAL FIXATION LEG Right 03/10/2019  ? Procedure: OPEN REDUCTION INTERNAL FIXATION RIGHT ANKLE;  Surgeon: Marchia Bond, MD;  Location: [facility];  Service: Orthopedics;  Laterality: Right;  ? HARDWARE REMOVAL Right 02/05/2021  ? Procedure: REMOVAL OF HARDWARE RIGHT ANKLE;  Surgeon: Newt Minion, MD;  Location: [facility];  Service: Orthopedics;  Laterality: Right;  ? VARICOSE VEIN SURGERY Left   ? left leg  ? VIDEO BRONCHOSCOPY WITH ENDOBRONCHIAL NAVIGATION Right 08/25/2020  ? Procedure: VIDEO BRONCHOSCOPY WITH ENDOBRONCHIAL NAVIGATION WITH FIDUCIAL PLACEMENT;  Surgeon: Garner Nash, DO;  Location: [facility];  Service: Pulmonary;  Laterality: Right;  ? ?Social History  ? ?Occupational History  ? Not on file  ?Tobacco Use  ? Smoking status: Former  ?  Packs/day: 1.00  ?  Years: 30.00  ?  Pack years: 30.00  ?  Types: Cigarettes  ?  Quit date: 2005  ?  Years since quitting: 18.3  ? Smokeless tobacco: Never  ?Vaping  Use  ? Vaping Use: Never used  ?Substance and Sexual Activity  ? Alcohol use: No  ? Drug use: No  ? Sexual activity: Not Currently  ?  Partners: Male  ?  Birth control/protection: Post-menopausal  ? ? ? ? ? ? ?

## 2022-03-30 ENCOUNTER — Other Ambulatory Visit: Payer: Self-pay | Admitting: Obstetrics & Gynecology

## 2022-03-30 ENCOUNTER — Other Ambulatory Visit (HOSPITAL_COMMUNITY): Payer: Self-pay

## 2022-03-30 DIAGNOSIS — F419 Anxiety disorder, unspecified: Secondary | ICD-10-CM

## 2022-03-30 MED ORDER — LORAZEPAM 1 MG PO TABS
1.0000 mg | ORAL_TABLET | Freq: Every evening | ORAL | 2 refills | Status: DC | PRN
  Filled 2022-03-30: qty 30, 30d supply, fill #0
  Filled 2022-05-04: qty 30, 30d supply, fill #1
  Filled 2022-06-14: qty 30, 30d supply, fill #2

## 2022-04-07 DIAGNOSIS — M5416 Radiculopathy, lumbar region: Secondary | ICD-10-CM | POA: Diagnosis not present

## 2022-04-08 ENCOUNTER — Telehealth: Payer: Self-pay | Admitting: Orthopaedic Surgery

## 2022-04-08 NOTE — Telephone Encounter (Signed)
Pt called for a requesting for a referral with Dr Ernestina Patches for bone density test. Please call pt at [phone] or [phone]

## 2022-04-08 NOTE — Telephone Encounter (Signed)
Spoke with patient. She said that Dr.Jones wants her to have a dexa scan to evaluate the quality of her bones. I advised her to contact their office since they are the ordering physician that wants the scan done. She will contact them.

## 2022-04-13 ENCOUNTER — Other Ambulatory Visit: Payer: Self-pay | Admitting: Student

## 2022-04-13 DIAGNOSIS — Z13820 Encounter for screening for osteoporosis: Secondary | ICD-10-CM

## 2022-04-15 ENCOUNTER — Telehealth: Payer: Self-pay | Admitting: Physical Medicine and Rehabilitation

## 2022-04-15 NOTE — Telephone Encounter (Signed)
Pt called requesting an appt for back injection. Please call pt at [phone].

## 2022-04-18 ENCOUNTER — Ambulatory Visit: Payer: Medicare Other | Admitting: Orthopedic Surgery

## 2022-04-18 DIAGNOSIS — G5701 Lesion of sciatic nerve, right lower limb: Secondary | ICD-10-CM | POA: Diagnosis not present

## 2022-04-21 DIAGNOSIS — Z681 Body mass index (BMI) 19 or less, adult: Secondary | ICD-10-CM | POA: Diagnosis not present

## 2022-04-21 DIAGNOSIS — G5701 Lesion of sciatic nerve, right lower limb: Secondary | ICD-10-CM | POA: Diagnosis not present

## 2022-04-25 DIAGNOSIS — L820 Inflamed seborrheic keratosis: Secondary | ICD-10-CM | POA: Diagnosis not present

## 2022-04-25 DIAGNOSIS — L821 Other seborrheic keratosis: Secondary | ICD-10-CM | POA: Diagnosis not present

## 2022-04-25 DIAGNOSIS — L603 Nail dystrophy: Secondary | ICD-10-CM | POA: Diagnosis not present

## 2022-04-26 ENCOUNTER — Ambulatory Visit: Payer: Medicare Other | Admitting: Orthopedic Surgery

## 2022-04-26 DIAGNOSIS — G8929 Other chronic pain: Secondary | ICD-10-CM

## 2022-04-26 DIAGNOSIS — M5441 Lumbago with sciatica, right side: Secondary | ICD-10-CM | POA: Diagnosis not present

## 2022-04-27 ENCOUNTER — Telehealth: Payer: Self-pay | Admitting: Orthopedic Surgery

## 2022-04-27 NOTE — Telephone Encounter (Signed)
This pt was in the office yesterday for LBP who had been evaluated by Dr. Ronnald Ramp and had ESI. You gave an rx for tramadol yesterday and the pt states that she has had a bad reaction and wants to know what else she can do. This pt also has a listed allergy of oxycodone ( causing afib)

## 2022-04-27 NOTE — Telephone Encounter (Signed)
Pt called and states she has a horrible reaction to tramadol. She would like someone to call her so she can speak about what else to do.

## 2022-04-27 NOTE — Telephone Encounter (Signed)
I called pt and advised of message below.

## 2022-05-02 ENCOUNTER — Encounter: Payer: Self-pay | Admitting: Orthopedic Surgery

## 2022-05-02 NOTE — Progress Notes (Signed)
Office Visit Note   Patient: Julie Mann           Date of Birth: 1946/07/29           MRN: [id number] Visit Date: 04/26/2022              Requested by: Lajean Manes, MD [address] [place],  [place] [address] PCP: Lajean Manes, MD  Chief Complaint  Patient presents with   Lower Back - Follow-up    A  HPI: Patient is a 76 year old woman who presents in follow-up for lower back pain.  She states she is in pain all the time.  She did see Dr. Ernestina Patches for epidural steroid injections the first shot did not help she states the second shot caused diarrhea.  She has been to acupuncture therapy and she did see Dr. Ronnald Ramp of neurosurgery for what she states she had another shot.  Patient states she cannot have surgery due to her allergy to pain medicine.  Patient states she is not allergic to pain medicine.  Patient states she now has piriformis syndrome.  Assessment & Plan: Visit Diagnoses:  1. Chronic right-sided low back pain with right-sided sciatica     Plan: Patient states she is now scheduled for a second SI joint injection.  Follow-Up Instructions: Return in about 2 months (around 06/26/2022).   Ortho Exam  Patient is alert, oriented, no adenopathy, well-dressed, normal affect, normal respiratory effort. Examination patient has a negative straight leg raise bilaterally no focal motor weakness she has pain to palpation over the SI joints bilaterally she has no pain over the lateral hip no pain with range of motion of the hip.  Imaging: No results found. No images are attached to the encounter.  Labs: Lab Results  Component Value Date   REPTSTATUS 02/10/2021 FINAL 02/05/2021   GRAMSTAIN  02/05/2021    MODERATE WBC PRESENT,BOTH PMN AND MONONUCLEAR NO ORGANISMS SEEN    CULT  02/05/2021    RARE STAPHYLOCOCCUS CAPITIS RARE STAPHYLOCOCCUS EPIDERMIDIS NO ANAEROBES ISOLATED Performed at [facility], [address] [place], [place] [address]     [place] 02/05/2021   LABORGA STAPHYLOCOCCUS EPIDERMIDIS 02/05/2021     Lab Results  Component Value Date   ALBUMIN 3.6 08/25/2020   ALBUMIN 4.1 03/24/2009   ALBUMIN 3.9 09/24/2008    No results found for: "MG" No results found for: "VD25OH"  No results found for: "PREALBUMIN"    Latest Ref Rng & Units 02/05/2021    7:01 AM 08/25/2020    6:44 AM 03/10/2019    8:23 AM  CBC EXTENDED  WBC 4.0 - 10.5 K/uL 7.5  6.6  13.1   RBC 3.87 - 5.11 MIL/uL 5.22  4.76  4.70   Hemoglobin 12.0 - 15.0 g/dL 13.9  12.6  12.8   HCT 36.0 - 46.0 % 45.2  40.5  40.5   Platelets 150 - 400 K/uL 391  321  287      There is no height or weight on file to calculate BMI.  Orders:  No orders of the defined types were placed in this encounter.  No orders of the defined types were placed in this encounter.    Procedures: No procedures performed  Clinical Data: No additional findings.  ROS:  All other systems negative, except as noted in the HPI. Review of Systems  Objective: Vital Signs: LMP 11/14/1992   Specialty Comments:  EXAM: MRI LUMBAR SPINE WITHOUT CONTRAST  TECHNIQUE: Multiplanar, multisequence MR imaging of the lumbar spine was performed. No intravenous contrast was administered.   COMPARISON:  Lumbar spine MRI 05/30/2012   FINDINGS: Segmentation:  Standard.   Alignment: Slight lower lumbar levoscoliosis. No significant listhesis.   Vertebrae: No fracture or suspicious marrow lesion. Mild degenerative endplate edema at W2-N5.   Conus medullaris and cauda equina: Conus extends to the T12 level and is suboptimally evaluated as it was not covered on axial images. Unremarkable appearance of the cauda equina.   Paraspinal and other soft tissues: Small renal cysts.   Disc levels:   Disc desiccation throughout the lumbar spine with exception of L3-4. Progressive, severe disc space narrowing at L5-S1.   L1-2: Negative.   L2-3: At most minimal disc  bulging without stenosis.   L3-4: Minimal leftward disc bulging without stenosis.   L4-5: Disc bulging and moderate facet and ligamentum flavum hypertrophy, mildly progressed but without associated stenosis. Small synovial cysts posterior to the facet joints bilaterally, not in a position to cause neural impingement.   L5-S1: Disc bulging, endplate spurring, disc space height loss, and mild facet hypertrophy without significant stenosis.   IMPRESSION: 1. Mild progression advanced disc degeneration at L5-S1 without stenosis. 2. Mildly progressive facet hypertrophy at L4-5 without stenosis.     Electronically Signed   By: Logan Bores M.D.   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   PVD (posterior vitreous detachment) 07/27/2021   Pseudophakia of right eye 07/27/2021   Pseudophakia of left eye 07/27/2021   Vitreomacular adhesion of right eye 07/27/2021   Macular pucker, right eye 62/13/0865   Hardware complicating wound infection ([place])    Neck pain 10/05/2020   Nodule of upper lobe of right lung 08/17/2020   Biceps tendonosis of left shoulder 02/13/2020   Impingement syndrome of left shoulder 02/13/2020   Post-operative state 02/13/2020   Ankle dislocation, right, initial encounter 03/10/2019   Trimalleolar fracture of ankle, closed, right, initial encounter    Chronic left shoulder pain 02/19/2019   Anxiety 12/17/2016   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  stairs (If Iam tired)   Dysrhythmia    PAF   Fall from horse 01/2018   Fracture, ankle 02/2019   Right    High cholesterol    History of stomach ulcers ~ 1966   Osteopenia     Family History  Problem Relation Age of Onset   Breast cancer Maternal Aunt    Breast cancer Maternal Grandmother    Heart disease Mother    Stroke Father    Rectal cancer Paternal Grandmother    Breast cancer Sister 72   Breast cancer Sister 66    Past Surgical History:  Procedure Laterality Date   ANUS SURGERY  2005   "biopsy"   BREAST CYST EXCISION Right 1966   DILATION  AND CURETTAGE OF UTERUS  1980's   S/P miscarriage   EXTERNAL FIXATION LEG Right 03/10/2019   Procedure: OPEN REDUCTION INTERNAL FIXATION RIGHT ANKLE;  Surgeon: Marchia Bond, MD;  Location: [place];  Service: Orthopedics;  Laterality: Right;   HARDWARE REMOVAL Right 02/05/2021   Procedure: REMOVAL OF HARDWARE RIGHT ANKLE;  Surgeon: Newt Minion, MD;  Location: [place];  Service: Orthopedics;  Laterality: Right;   VARICOSE VEIN SURGERY Left    left leg   VIDEO BRONCHOSCOPY WITH ENDOBRONCHIAL NAVIGATION Right 08/25/2020   Procedure: VIDEO BRONCHOSCOPY WITH ENDOBRONCHIAL NAVIGATION WITH FIDUCIAL PLACEMENT;  Surgeon: Garner Nash, DO;  Location: [place];  Service: Pulmonary;  Laterality: Right;   Social History   Occupational History   Not on file  Tobacco Use   Smoking status: Former    Packs/day: 1.00    Years: 30.00    Total pack years: 30.00    Types: Cigarettes    Quit date: 2005    Years since quitting: 18.4   Smokeless tobacco: Never  Vaping Use   Vaping Use: Never used  Substance and Sexual Activity   Alcohol use: No   Drug use: No   Sexual activity: Not Currently    Partners: Male    Birth control/protection: Post-menopausal

## 2022-05-05 ENCOUNTER — Other Ambulatory Visit (HOSPITAL_COMMUNITY): Payer: Self-pay

## 2022-05-11 DIAGNOSIS — H52203 Unspecified astigmatism, bilateral: Secondary | ICD-10-CM | POA: Diagnosis not present

## 2022-05-11 DIAGNOSIS — H35373 Puckering of macula, bilateral: Secondary | ICD-10-CM | POA: Diagnosis not present

## 2022-05-11 DIAGNOSIS — Z961 Presence of intraocular lens: Secondary | ICD-10-CM | POA: Diagnosis not present

## 2022-05-12 ENCOUNTER — Other Ambulatory Visit (HOSPITAL_BASED_OUTPATIENT_CLINIC_OR_DEPARTMENT_OTHER): Payer: Self-pay

## 2022-05-12 ENCOUNTER — Other Ambulatory Visit (HOSPITAL_COMMUNITY): Payer: Self-pay

## 2022-05-12 DIAGNOSIS — M5416 Radiculopathy, lumbar region: Secondary | ICD-10-CM | POA: Diagnosis not present

## 2022-05-12 DIAGNOSIS — T8463XA Infection and inflammatory reaction due to internal fixation device of spine, initial encounter: Secondary | ICD-10-CM | POA: Diagnosis not present

## 2022-05-12 MED ORDER — PREGABALIN 25 MG PO CAPS
ORAL_CAPSULE | ORAL | 0 refills | Status: DC
  Filled 2022-05-12: qty 90, 30d supply, fill #0

## 2022-05-18 DIAGNOSIS — G5701 Lesion of sciatic nerve, right lower limb: Secondary | ICD-10-CM | POA: Diagnosis not present

## 2022-05-31 ENCOUNTER — Other Ambulatory Visit (HOSPITAL_COMMUNITY): Payer: Self-pay

## 2022-06-03 ENCOUNTER — Other Ambulatory Visit (HOSPITAL_COMMUNITY): Payer: Self-pay

## 2022-06-03 DIAGNOSIS — M5442 Lumbago with sciatica, left side: Secondary | ICD-10-CM | POA: Diagnosis not present

## 2022-06-03 DIAGNOSIS — G8929 Other chronic pain: Secondary | ICD-10-CM | POA: Diagnosis not present

## 2022-06-03 DIAGNOSIS — M5441 Lumbago with sciatica, right side: Secondary | ICD-10-CM | POA: Diagnosis not present

## 2022-06-03 MED ORDER — HYDROCODONE-ACETAMINOPHEN 7.5-325 MG PO TABS
1.0000 | ORAL_TABLET | Freq: Three times a day (TID) | ORAL | 0 refills | Status: DC | PRN
Start: 2022-06-03 — End: 2023-09-21
  Filled 2022-06-03: qty 30, 10d supply, fill #0

## 2022-06-10 DIAGNOSIS — K5900 Constipation, unspecified: Secondary | ICD-10-CM | POA: Diagnosis not present

## 2022-06-10 DIAGNOSIS — Z85048 Personal history of other malignant neoplasm of rectum, rectosigmoid junction, and anus: Secondary | ICD-10-CM | POA: Diagnosis not present

## 2022-06-10 DIAGNOSIS — K624 Stenosis of anus and rectum: Secondary | ICD-10-CM | POA: Diagnosis not present

## 2022-06-10 DIAGNOSIS — R140 Abdominal distension (gaseous): Secondary | ICD-10-CM | POA: Diagnosis not present

## 2022-06-15 ENCOUNTER — Other Ambulatory Visit (HOSPITAL_COMMUNITY): Payer: Self-pay

## 2022-06-22 DIAGNOSIS — T148XXA Other injury of unspecified body region, initial encounter: Secondary | ICD-10-CM | POA: Diagnosis not present

## 2022-06-28 DIAGNOSIS — R21 Rash and other nonspecific skin eruption: Secondary | ICD-10-CM | POA: Diagnosis not present

## 2022-06-28 DIAGNOSIS — T50905A Adverse effect of unspecified drugs, medicaments and biological substances, initial encounter: Secondary | ICD-10-CM | POA: Diagnosis not present

## 2022-06-28 DIAGNOSIS — K219 Gastro-esophageal reflux disease without esophagitis: Secondary | ICD-10-CM | POA: Diagnosis not present

## 2022-06-29 ENCOUNTER — Other Ambulatory Visit: Payer: Self-pay | Admitting: Obstetrics & Gynecology

## 2022-06-29 ENCOUNTER — Telehealth: Payer: Self-pay | Admitting: Orthopedic Surgery

## 2022-06-29 NOTE — Telephone Encounter (Signed)
Pt came in office requesting a referral for physical therapy here in our office. Pt phone number is [phone]

## 2022-06-30 ENCOUNTER — Other Ambulatory Visit (HOSPITAL_COMMUNITY): Payer: Self-pay

## 2022-06-30 ENCOUNTER — Ambulatory Visit: Payer: Medicare Other | Admitting: Orthopedic Surgery

## 2022-06-30 MED ORDER — IBUPROFEN 600 MG PO TABS
600.0000 mg | ORAL_TABLET | Freq: Every day | ORAL | 1 refills | Status: DC | PRN
  Filled 2022-06-30: qty 30, 30d supply, fill #0
  Filled 2022-08-24: qty 30, 30d supply, fill #1

## 2022-07-01 ENCOUNTER — Other Ambulatory Visit: Payer: Self-pay | Admitting: Orthopedic Surgery

## 2022-07-01 DIAGNOSIS — G8929 Other chronic pain: Secondary | ICD-10-CM

## 2022-07-01 NOTE — Telephone Encounter (Signed)
Order placed and pt informed.

## 2022-07-04 ENCOUNTER — Ambulatory Visit: Payer: Medicare Other | Admitting: Orthopedic Surgery

## 2022-07-04 ENCOUNTER — Other Ambulatory Visit (HOSPITAL_COMMUNITY): Payer: Self-pay

## 2022-07-04 DIAGNOSIS — G8929 Other chronic pain: Secondary | ICD-10-CM | POA: Diagnosis not present

## 2022-07-04 DIAGNOSIS — M5441 Lumbago with sciatica, right side: Secondary | ICD-10-CM | POA: Diagnosis not present

## 2022-07-04 MED ORDER — TRAMADOL HCL 50 MG PO TABS
50.0000 mg | ORAL_TABLET | Freq: Two times a day (BID) | ORAL | 0 refills | Status: DC | PRN
Start: 2022-07-04 — End: 2022-10-03
  Filled 2022-07-04: qty 30, 15d supply, fill #0

## 2022-07-05 ENCOUNTER — Encounter: Payer: Self-pay | Admitting: Orthopedic Surgery

## 2022-07-05 NOTE — Progress Notes (Signed)
Office Visit Note   Patient: Julie Mann           Date of Birth: 07/02/1946           MRN: [id number] Visit Date: 07/04/2022              Requested by: Lajean Manes, MD [address] [place],  [place] [address] PCP: Lajean Manes, MD  Chief Complaint  Patient presents with   Lower Back - Follow-up      HPI: Patient is a 76 year old woman who presents in follow-up for chronic lower back pain with right-sided radicular symptoms.  She had adverse reaction to her last epidural steroid injection and states that this did not help.  She states that recently she took 1 tramadol and it worked until evening.  Pain primarily radiating from the right buttocks down the right lower extremity.  Patient does have referral to physical therapy.  Assessment & Plan: Visit Diagnoses:  1. Chronic right-sided low back pain with right-sided sciatica     Plan: Proceed with physical therapy.  A prescription is called in for tramadol.  Since she did not have any relief from previous epidural steroid injections recommended against pursuing additional injection.  Follow-Up Instructions: Return in about 4 weeks (around 08/01/2022).   Ortho Exam  Patient is alert, oriented, no adenopathy, well-dressed, normal affect, normal respiratory effort. Examination patient has a negative straight leg raise bilaterally she has no focal motor weakness.  She has subjective radicular symptoms from the right buttocks to the right leg.  Imaging: No results found. No images are attached to the encounter.  Labs: Lab Results  Component Value Date   REPTSTATUS 02/10/2021 FINAL 02/05/2021   GRAMSTAIN  02/05/2021    MODERATE WBC PRESENT,BOTH PMN AND MONONUCLEAR NO ORGANISMS SEEN    CULT  02/05/2021    RARE STAPHYLOCOCCUS CAPITIS RARE STAPHYLOCOCCUS EPIDERMIDIS NO ANAEROBES ISOLATED Performed at [facility], [address], [place], [place] [address]    [place]  02/05/2021   LABORGA STAPHYLOCOCCUS EPIDERMIDIS 02/05/2021     Lab Results  Component Value Date   ALBUMIN 3.6 08/25/2020   ALBUMIN 4.1 03/24/2009   ALBUMIN 3.9 09/24/2008    No results found for: "MG" No results found for: "VD25OH"  No results found for: "PREALBUMIN"    Latest Ref Rng & Units 02/05/2021    7:01 AM 08/25/2020    6:44 AM 03/10/2019    8:23 AM  CBC EXTENDED  WBC 4.0 - 10.5 K/uL 7.5  6.6  13.1   RBC 3.87 - 5.11 MIL/uL 5.22  4.76  4.70   Hemoglobin 12.0 - 15.0 g/dL 13.9  12.6  12.8   HCT 36.0 - 46.0 % 45.2  40.5  40.5   Platelets 150 - 400 K/uL 391  321  287      There is no height or weight on file to calculate BMI.  Orders:  No orders of the defined types were placed in this encounter.  Meds ordered this encounter  Medications   traMADol (ULTRAM) 50 MG tablet    Sig: Take 1 tablet (50 mg total) by mouth every 12 (twelve) hours as needed.    Dispense:  30 tablet    Refill:  0     Procedures: No procedures performed  Clinical Data: No additional findings.  ROS:  All other systems negative, except as noted in the HPI. Review of Systems  Objective: Vital Signs: LMP 11/14/1992  Specialty Comments:  EXAM: MRI LUMBAR SPINE WITHOUT CONTRAST   TECHNIQUE: Multiplanar, multisequence MR imaging of the lumbar spine was performed. No intravenous contrast was administered.   COMPARISON:  Lumbar spine MRI 05/30/2012   FINDINGS: Segmentation:  Standard.   Alignment: Slight lower lumbar levoscoliosis. No significant listhesis.   Vertebrae: No fracture or suspicious marrow lesion. Mild degenerative endplate edema at K3-T4.   Conus medullaris and cauda equina: Conus extends to the T12 level and is suboptimally evaluated as it was not covered on axial images. Unremarkable appearance of the cauda equina.   Paraspinal and other soft tissues: Small renal cysts.   Disc levels:   Disc desiccation throughout the lumbar spine with exception of  L3-4. Progressive, severe disc space narrowing at L5-S1.   L1-2: Negative.   L2-3: At most minimal disc bulging without stenosis.   L3-4: Minimal leftward disc bulging without stenosis.   L4-5: Disc bulging and moderate facet and ligamentum flavum hypertrophy, mildly progressed but without associated stenosis. Small synovial cysts posterior to the facet joints bilaterally, not in a position to cause neural impingement.   L5-S1: Disc bulging, endplate spurring, disc space height loss, and mild facet hypertrophy without significant stenosis.   IMPRESSION: 1. Mild progression advanced disc degeneration at L5-S1 without stenosis. 2. Mildly progressive facet hypertrophy at L4-5 without stenosis.     Electronically Signed   By: Logan Bores M.D.   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   PVD (posterior vitreous detachment) 07/27/2021   Pseudophakia of right eye 07/27/2021   Pseudophakia of left eye 07/27/2021   Vitreomacular adhesion of right eye 07/27/2021   Macular pucker, right eye 65/68/1275   Hardware complicating wound infection ([place])    Neck pain 10/05/2020   Nodule of upper lobe of right lung 08/17/2020   Biceps tendonosis of left shoulder 02/13/2020   Impingement syndrome of left shoulder 02/13/2020   Post-operative state 02/13/2020   Ankle dislocation, right, initial encounter 03/10/2019   Trimalleolar fracture of ankle, closed, right, initial encounter    Chronic left shoulder pain 02/19/2019   Anxiety 12/17/2016   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  stairs (If Iam tired)   Dysrhythmia    PAF   Fall from horse 01/2018   Fracture, ankle 02/2019   Right    High cholesterol    History of stomach ulcers ~ 1966   Osteopenia     Family History  Problem Relation Age of Onset   Breast cancer Maternal Aunt    Breast cancer Maternal Grandmother    Heart disease Mother    Stroke Father    Rectal cancer Paternal Grandmother    Breast cancer Sister 43   Breast cancer Sister 64    Past Surgical History:  Procedure Laterality Date   ANUS SURGERY  2005   "  biopsy"   BREAST CYST EXCISION Right 1966   DILATION AND CURETTAGE OF UTERUS  1980's   S/P miscarriage   EXTERNAL FIXATION LEG Right 03/10/2019   Procedure: OPEN REDUCTION INTERNAL FIXATION RIGHT ANKLE;  Surgeon: Marchia Bond, MD;  Location: [place];  Service: Orthopedics;  Laterality: Right;   HARDWARE REMOVAL Right 02/05/2021   Procedure: REMOVAL OF HARDWARE RIGHT ANKLE;  Surgeon: Newt Minion, MD;  Location: [place];  Service: Orthopedics;  Laterality: Right;   VARICOSE VEIN SURGERY Left    left leg   VIDEO BRONCHOSCOPY WITH ENDOBRONCHIAL NAVIGATION Right 08/25/2020   Procedure: VIDEO BRONCHOSCOPY WITH ENDOBRONCHIAL NAVIGATION WITH FIDUCIAL PLACEMENT;  Surgeon: Garner Nash, DO;  Location: [facility];  Service: Pulmonary;  Laterality: Right;   Social History   Occupational History   Not on file  Tobacco Use   Smoking status: Former    Packs/day: 1.00    Years: 30.00    Total pack years: 30.00    Types: Cigarettes    Quit date: 2005    Years since quitting: 18.6   Smokeless tobacco: Never  Vaping Use   Vaping Use: Never used  Substance and Sexual Activity   Alcohol use: No   Drug use: No   Sexual activity: Not Currently    Partners: Male    Birth control/protection:  Post-menopausal

## 2022-07-11 ENCOUNTER — Other Ambulatory Visit: Payer: Self-pay | Admitting: Orthopedic Surgery

## 2022-07-11 ENCOUNTER — Telehealth: Payer: Self-pay | Admitting: Orthopedic Surgery

## 2022-07-11 ENCOUNTER — Other Ambulatory Visit (HOSPITAL_COMMUNITY): Payer: Self-pay

## 2022-07-11 MED ORDER — METHOCARBAMOL 500 MG PO TABS
500.0000 mg | ORAL_TABLET | Freq: Three times a day (TID) | ORAL | 0 refills | Status: DC | PRN
  Filled 2022-07-11: qty 30, 10d supply, fill #0

## 2022-07-11 NOTE — Telephone Encounter (Signed)
Pt informed

## 2022-07-11 NOTE — Telephone Encounter (Signed)
Pt here for f/u on 07/05/22 for lumbar radiculopathy. Currently taking tramadol. Allergy list indicates oxycodone caused her to go into Afib.

## 2022-07-11 NOTE — Telephone Encounter (Signed)
Pt requesting a new pain medication or something stronger. Pt states tramadol is not working. Please call pt about this matter at [phone].

## 2022-07-13 ENCOUNTER — Other Ambulatory Visit: Payer: Self-pay | Admitting: Obstetrics & Gynecology

## 2022-07-13 DIAGNOSIS — F419 Anxiety disorder, unspecified: Secondary | ICD-10-CM

## 2022-07-14 ENCOUNTER — Other Ambulatory Visit: Payer: Self-pay

## 2022-07-14 ENCOUNTER — Ambulatory Visit: Payer: Medicare Other | Admitting: Physical Therapy

## 2022-07-14 ENCOUNTER — Encounter: Payer: Self-pay | Admitting: Physical Therapy

## 2022-07-14 DIAGNOSIS — M6281 Muscle weakness (generalized): Secondary | ICD-10-CM

## 2022-07-14 DIAGNOSIS — M5459 Other low back pain: Secondary | ICD-10-CM | POA: Diagnosis not present

## 2022-07-14 NOTE — Therapy (Signed)
OUTPATIENT PHYSICAL THERAPY THORACOLUMBAR EVALUATION   Patient Name: Julie Mann MRN: [id number] DOB:[birth year], 76 y.o., female Today's Date: 07/14/2022   PT End of Session - 07/14/22 1247     Visit Number 1    Number of Visits 6    Date for PT Re-Evaluation 08/25/22    Authorization Type UHC Medicare $20 copay    PT Start Time 1100    PT Stop Time 1144    PT Time Calculation (min) 44 min    Activity Tolerance Patient tolerated treatment well    Behavior During Therapy WFL for tasks assessed/performed             Past Medical History:  Diagnosis Date   Anal cancer ([place]) 2005   SCCa anus-stage II chemo, radiation   Anxiety    Arthritis    "hands, fingers, back" (02/27/2014)   Atrial fibrillation ([place])    Cat scratch of right hand 02/24/2014   COPD (chronic obstructive pulmonary disease) ([place])    Dyspnea    walking up hill;.  stairs (If Iam tired)   Dysrhythmia    PAF   Fall from horse 01/2018   Fracture, ankle 02/2019   Right    High cholesterol    History of stomach ulcers ~ 1966   Osteopenia    Past Surgical History:  Procedure Laterality Date   ANUS SURGERY  2005   "biopsy"   BREAST CYST EXCISION Right 1966   DILATION AND CURETTAGE OF UTERUS  1980's   S/P miscarriage   EXTERNAL FIXATION LEG Right 03/10/2019   Procedure: OPEN REDUCTION INTERNAL FIXATION RIGHT ANKLE;  Surgeon: Marchia Bond, MD;  Location: [place];  Service: Orthopedics;  Laterality: Right;   HARDWARE REMOVAL Right 02/05/2021   Procedure: REMOVAL OF HARDWARE RIGHT ANKLE;  Surgeon: Newt Minion, MD;  Location: [place];  Service: Orthopedics;  Laterality: Right;   VARICOSE VEIN SURGERY Left    left leg   VIDEO BRONCHOSCOPY WITH ENDOBRONCHIAL NAVIGATION Right 08/25/2020   Procedure: VIDEO BRONCHOSCOPY WITH ENDOBRONCHIAL NAVIGATION WITH FIDUCIAL PLACEMENT;  Surgeon: Garner Nash, DO;  Location: [place];  Service: Pulmonary;  Laterality: Right;   Patient Active Problem List   Diagnosis Date  Noted   Low back pain 03/09/2022   Pelvic pain 03/09/2022   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   PVD (posterior vitreous detachment) 07/27/2021   Pseudophakia of right eye 07/27/2021   Pseudophakia of left eye 07/27/2021   Vitreomacular adhesion of right eye 07/27/2021   Macular pucker, right eye 27/25/3664   Hardware complicating wound infection ([place])    Neck pain 10/05/2020   Nodule of upper lobe of right lung 08/17/2020   Biceps tendonosis of left shoulder 02/13/2020   Impingement syndrome of left shoulder 02/13/2020   Post-operative state 02/13/2020   Ankle dislocation, right, initial encounter 03/10/2019   Trimalleolar fracture of ankle, closed, right, initial encounter    Chronic left shoulder pain 02/19/2019   Anxiety 12/17/2016   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[place]) 12/17/2016   Osteopenia 12/17/2016   Paroxysmal atrial fibrillation ([place]) 12/17/2016   Pure hypercholesterolemia 12/17/2016   Rectal cancer ([place]) 07/30/2015    PCP: Lajean Manes, MD  REFERRING PROVIDER: Newt Minion, MD   REFERRING DIAG: [phone] (ICD-10-CM) - Chronic right-sided low back pain with right-sided sciatica   Rationale for Evaluation and Treatment Rehabilitation  THERAPY DIAG:  Other low back pain - Plan: PT plan of care cert/re-cert  Muscle weakness (generalized) - Plan: PT plan of care  cert/re-cert  ONSET DATE: Dec 2022  SUBJECTIVE:                                                                                                                                                                                           SUBJECTIVE STATEMENT: Pt with continued pain in lumbar spine and bil piriformis since December 2022.  She reports pain has gotten progressively worse.  She has tried injections which were unsuccessful, massage without success.  PERTINENT HISTORY:  Hx rectal cancer, anxiety, depression, osteopenia, a-fib, Rt ankle fx  PAIN:  Are you having pain? Yes: NPRS scale: 5  currently, up to 7, at best 4/10 Pain location: bil glutes; shoots down RLE Pain description: aching, shooting Aggravating factors: sitting Relieving factors: biofreeze patches, aleve   PRECAUTIONS: None  WEIGHT BEARING RESTRICTIONS No  FALLS:  Has patient fallen in last 6 months? No  LIVING ENVIRONMENT: Lives with: lives with their spouse Lives in: House/apartment Stairs: Yes: inside; no difficulty  OCCUPATION: plays violin; about to get into busy season  PLOF: Independent and Leisure: yardwork, Art gallery manager, walking  PATIENT GOALS improve pain   OBJECTIVE:   DIAGNOSTIC FINDINGS:  MRI: 1. Mild progression advanced disc degeneration at L5-S1 without stenosis. 2. Mildly progressive facet hypertrophy at L4-5 without stenosis.  PATIENT SURVEYS:  07/14/22: FOTO 60 (predicted 65)  SCREENING FOR RED FLAGS: Bowel or bladder incontinence: No Spinal tumors: No   COGNITION: Overall cognitive status: Within functional limits for tasks assessed     SENSATION: WFL  POSTURE: rounded shoulders and forward head  PALPATION: 07/14/22: Active trigger points in bil glutes and piriformis  LUMBAR ROM:   Active  A/PROM  eval  Flexion WNL  Extension WNL  Right lateral flexion WNL  Left lateral flexion WNL  Right rotation WNL  Left rotation WNL   (Blank rows = not tested)  LOWER EXTREMITY MMT:    MMT Right eval Left eval  Hip flexion 3/5 3/5  Hip extension 3/5 3/5  Hip abduction 3/5 3/  Hip internal rotation 4/5 4/5  Hip external rotation 3/5 3/5  Knee extension 5/5 5/5   (Blank rows = not tested)   GAIT: 07/14/22 Independent with mild antalgic gait; no evidence of imbalance  TODAY'S TREATMENT  07/14/22 Therex See HEP - performed trial reps PRN for comprehension and understanding  Manual STM with compression to bil glutes and piriformis; skilled palpation and monitoring of soft tissue during DN  Trigger Point Dry-Needling  Treatment instructions:  Expect mild to moderate muscle soreness. S/S of pneumothorax if dry needled over a lung field, and to seek immediate medical attention should they  occur. Patient verbalized understanding of these instructions and education.  Patient Consent Given: Yes Education handout provided: Previously provided Muscles treated: bil piriformis and bil glute med Electrical stimulation performed: No Parameters: N/A Treatment response/outcome: twitch responses noted bil    PATIENT EDUCATION:  Education details: HEP Person educated: Patient Education method: Consulting civil engineer, Media planner, and Handouts Education comprehension: verbalized understanding, returned demonstration, and needs further education   HOME EXERCISE PROGRAM: Access Code: [email or web address] URL: [email or web address] Date: 07/14/2022 Prepared by: Faustino Congress  Exercises - Standing Lumbar Extension  - 1 x daily - 7 x weekly - 1 sets - 10 reps - Seated Piriformis Stretch with Trunk Bend  - 1 x daily - 7 x weekly - 1 sets - 3 reps - 30 sec hold - Seated Piriformis Stretch  - 1 x daily - 7 x weekly - 1 sets - 3 reps - 30 sec hold - Standing Piriformis Release with Ball at [employer]  - 1 x daily - 7 x weekly - 1 sets - 1 reps - 2-3 min hold - Standing Hip Extension with Resistance at Ankles and Counter Support  - 1 x daily - 7 x weekly - 2 sets - 10 reps - Standing Hip Abduction with Resistance at Ankles and Counter Support  - 1 x daily - 7 x weekly - 2 sets - 10 reps - Seated Hip Internal Rotation with Resistance  - 1 x daily - 7 x weekly - 2 sets - 10 reps - Seated Hip External Rotation with Resistance  - 1 x daily - 7 x weekly - 2 sets - 10 reps  ASSESSMENT:  CLINICAL IMPRESSION: Patient is a 76 y.o. female who was seen today for physical therapy evaluation and treatment for bil back and hip pain.  She demonstrates decreased strength and active trigger points affecting functional mobility.  She will benefit from PT to address  deficits listed.    OBJECTIVE IMPAIRMENTS Abnormal gait, decreased strength, increased fascial restrictions, increased muscle spasms, and pain.   ACTIVITY LIMITATIONS carrying, lifting, bending, sitting, standing, squatting, stairs, transfers, bed mobility, and locomotion level  PARTICIPATION LIMITATIONS: community activity, occupation, and yard work  PERSONAL FACTORS 3+ comorbidities: Hx rectal cancer, anxiety, depression, osteopenia, a-fib, Rt ankle fx  are also affecting patient's functional outcome.   REHAB POTENTIAL: Good  CLINICAL DECISION MAKING: Evolving/moderate complexity  EVALUATION COMPLEXITY: Moderate   GOALS: Goals reviewed with patient? Yes  SHORT TERM GOALS: Target date: 08/04/2022  Independent with initial HEP Goal status: INITIAL   LONG TERM GOALS: Target date: 08/25/2022  Independent with final HEP Goal status: INITIAL  2.  FOTO score improved to 65 Goal status: INITIAL  3.  Bil hip strength improved to 4/5 for improved function and mobility Goal status: INIITAL  4.  Report pain < 3/10 with sitting for improved function and ability to tolerate work Goal status: INITIAL    PLAN: PT FREQUENCY: 1x/week  PT DURATION: 6 weeks  PLANNED INTERVENTIONS: Therapeutic exercises, Therapeutic activity, Neuromuscular re-education, Patient/Family education, Self Care, Joint mobilization, Dry Needling, Electrical stimulation, Cryotherapy, Moist heat, Taping, Traction, Manual therapy, and Re-evaluation.  PLAN FOR NEXT SESSION: review HEP, assess response to DN and repeat PRN  Laureen Abrahams, PT, DPT 07/14/22 12:55 PM

## 2022-07-15 ENCOUNTER — Other Ambulatory Visit (HOSPITAL_COMMUNITY): Payer: Self-pay

## 2022-07-15 MED ORDER — LORAZEPAM 1 MG PO TABS
1.0000 mg | ORAL_TABLET | Freq: Every evening | ORAL | 2 refills | Status: DC | PRN
  Filled 2022-07-15: qty 6, 6d supply, fill #0
  Filled 2022-07-15: qty 24, 24d supply, fill #0
  Filled 2022-08-11: qty 30, 30d supply, fill #1
  Filled 2022-09-18: qty 30, 30d supply, fill #2

## 2022-07-22 ENCOUNTER — Telehealth: Payer: Self-pay | Admitting: Rehabilitative and Restorative Service Providers"

## 2022-07-22 NOTE — Telephone Encounter (Signed)
Returned call to patient about increased pain since last visit.  Detailed instructions to use heat/ice, topical creams/patches as desired as well as using HEP.  Scheduled a visit on Monday (earlier than originally scheduled) for follow up with Faustino Congress.   Scot Jun, PT, DPT, OCS, ATC 07/22/22  2:19 PM

## 2022-07-25 ENCOUNTER — Ambulatory Visit: Payer: Medicare Other | Admitting: Physical Therapy

## 2022-07-25 ENCOUNTER — Encounter: Payer: Self-pay | Admitting: Physical Therapy

## 2022-07-25 DIAGNOSIS — M6281 Muscle weakness (generalized): Secondary | ICD-10-CM

## 2022-07-25 DIAGNOSIS — M5459 Other low back pain: Secondary | ICD-10-CM | POA: Diagnosis not present

## 2022-07-25 DIAGNOSIS — G8929 Other chronic pain: Secondary | ICD-10-CM | POA: Diagnosis not present

## 2022-07-25 DIAGNOSIS — M5441 Lumbago with sciatica, right side: Secondary | ICD-10-CM

## 2022-07-25 NOTE — Therapy (Signed)
OUTPATIENT PHYSICAL THERAPY TREATMENT NOTE   Patient Name: Julie Mann MRN: [id number] DOB:1945-12-02, 76 y.o., female Today's Date: 07/25/2022  END OF SESSION:   PT End of Session - 07/25/22 0803     Visit Number 2    Number of Visits 6    Date for PT Re-Evaluation 08/25/22    Authorization Type UHC Medicare $20 copay    PT Start Time 0800    PT Stop Time 0841    PT Time Calculation (min) 41 min    Activity Tolerance Patient tolerated treatment well    Behavior During Therapy [facility] for tasks assessed/performed             Past Medical History:  Diagnosis Date   Anal cancer ([place]) 2005   SCCa anus-stage II chemo, radiation   Anxiety    Arthritis    "hands, fingers, back" (02/27/2014)   Atrial fibrillation ([place])    Cat scratch of right hand 02/24/2014   COPD (chronic obstructive pulmonary disease) ([place])    Dyspnea    walking up hill;.  stairs (If Iam tired)   Dysrhythmia    PAF   Fall from horse 01/2018   Fracture, ankle 02/2019   Right    High cholesterol    History of stomach ulcers ~ 1966   Osteopenia    Past Surgical History:  Procedure Laterality Date   ANUS SURGERY  2005   "biopsy"   BREAST CYST EXCISION Right 1966   [place] OF UTERUS  1980's   S/P miscarriage   EXTERNAL FIXATION LEG Right 03/10/2019   Procedure: OPEN REDUCTION INTERNAL FIXATION RIGHT ANKLE;  Surgeon: Marchia Bond, MD;  Location: [place];  Service: Orthopedics;  Laterality: Right;   HARDWARE REMOVAL Right 02/05/2021   Procedure: REMOVAL OF HARDWARE RIGHT ANKLE;  Surgeon: Newt Minion, MD;  Location: [place];  Service: Orthopedics;  Laterality: Right;   VARICOSE VEIN SURGERY Left    left leg   VIDEO BRONCHOSCOPY WITH ENDOBRONCHIAL NAVIGATION Right 08/25/2020   Procedure: VIDEO BRONCHOSCOPY WITH ENDOBRONCHIAL NAVIGATION WITH FIDUCIAL PLACEMENT;  Surgeon: Garner Nash, DO;  Location: [place];  Service: Pulmonary;  Laterality: Right;   Patient Active Problem List    Diagnosis Date Noted   Low back pain 03/09/2022   Pelvic pain 03/09/2022   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   PVD (posterior vitreous detachment) 07/27/2021   Pseudophakia of right eye 07/27/2021   Pseudophakia of left eye 07/27/2021   Vitreomacular adhesion of right eye 07/27/2021   Macular pucker, right eye 70/48/8891   Hardware complicating wound infection ([place])    Neck pain 10/05/2020   Nodule of upper lobe of right lung 08/17/2020   Biceps tendonosis of left shoulder 02/13/2020   Impingement syndrome of left shoulder 02/13/2020   Post-operative state 02/13/2020   Ankle dislocation, right, initial encounter 03/10/2019   Trimalleolar fracture of ankle, closed, right, initial encounter    Chronic left shoulder pain 02/19/2019   Anxiety 12/17/2016   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[place]) 12/17/2016   Osteopenia 12/17/2016   Paroxysmal atrial fibrillation ([place]) 12/17/2016   Pure hypercholesterolemia 12/17/2016   Rectal cancer ([place]) 07/30/2015     THERAPY DIAG:  Other low back pain  Muscle weakness (generalized)  Chronic right-sided low back pain with right-sided sciatica   PCP: Lajean Manes, MD  REFERRING PROVIDER: Newt Minion, MD   REFERRING DIAG: [phone] (ICD-10-CM) - Chronic right-sided low back pain with right-sided sciatica   Rationale for Evaluation and  Treatment Rehabilitation  ONSET DATE: Dec 2022  SUBJECTIVE:                                                                                                                                                                                           SUBJECTIVE STATEMENT: Has been sore since last visit with DN, and then had to play all last week which probably didn't help.  She's not sure if DN was helpful.   PERTINENT HISTORY:  Hx rectal cancer, anxiety, depression, osteopenia, a-fib, Rt ankle fx  PAIN:  Are you having pain? Yes: NPRS scale: 5 currently, up to 7, at best 4/10 Pain location: bil glutes;  shoots down RLE Pain description: aching, shooting Aggravating factors: sitting Relieving factors: biofreeze patches, aleve   PRECAUTIONS: None  WEIGHT BEARING RESTRICTIONS No  FALLS:  Has patient fallen in last 6 months? No  LIVING ENVIRONMENT: Lives with: lives with their spouse Lives in: House/apartment Stairs: Yes: inside; no difficulty  OCCUPATION: plays violin; about to get into busy season  PLOF: Independent and Leisure: yardwork, Art gallery manager, walking  PATIENT GOALS improve pain   OBJECTIVE:   DIAGNOSTIC FINDINGS:  MRI: 1. Mild progression advanced disc degeneration at L5-S1 without stenosis. 2. Mildly progressive facet hypertrophy at L4-5 without stenosis.  PATIENT SURVEYS:  07/14/22: FOTO 60 (predicted 65)  POSTURE: rounded shoulders and forward head  PALPATION: 07/14/22: Active trigger points in bil glutes and piriformis  LUMBAR ROM:   Active  A/PROM  eval  Flexion WNL  Extension WNL  Right lateral flexion WNL  Left lateral flexion WNL  Right rotation WNL  Left rotation WNL   (Blank rows = not tested)  LOWER EXTREMITY MMT:    MMT Right eval Left eval  Hip flexion 3/5 3/5  Hip extension 3/5 3/5  Hip abduction 3/5 3/  Hip internal rotation 4/5 4/5  Hip external rotation 3/5 3/5  Knee extension 5/5 5/5   (Blank rows = not tested)   GAIT: 07/14/22 Independent with mild antalgic gait; no evidence of imbalance  TODAY'S TREATMENT  07/25/22 Therex Prone hip extension 2x10 bil; 3 sec hold [place] x20 reps, 5 sec hold Single limb clamshells in hooklying with L3 band 2x10 Seated hip IR with L3 x20 reps Seated hip ER L3 x 20 reps  Manual STM with compression to bil glutes and piriformis; skilled palpation and monitoring of soft tissue during DN  Trigger Point Dry-Needling  Treatment instructions: Expect mild to moderate muscle soreness. S/S of pneumothorax if dry needled over a lung field, and to seek immediate medical attention should  they occur. Patient verbalized understanding of these instructions  and education.  Patient Consent Given: Yes Education handout provided: Previously provided Muscles treated: bil glute max Electrical stimulation performed: No Parameters: N/A Treatment response/outcome: twitch responses noted bil  07/14/22 Therex See HEP - performed trial reps PRN for comprehension and understanding  Manual STM with compression to bil glutes and piriformis; skilled palpation and monitoring of soft tissue during DN  Trigger Point Dry-Needling  Treatment instructions: Expect mild to moderate muscle soreness. S/S of pneumothorax if dry needled over a lung field, and to seek immediate medical attention should they occur. Patient verbalized understanding of these instructions and education.  Patient Consent Given: Yes Education handout provided: Previously provided Muscles treated: bil piriformis and bil glute med Electrical stimulation performed: No Parameters: N/A Treatment response/outcome: twitch responses noted bil    PATIENT EDUCATION:  Education details: HEP Person educated: Patient Education method: Consulting civil engineer, Media planner, and Handouts Education comprehension: verbalized understanding, returned demonstration, and needs further education   HOME EXERCISE PROGRAM: Access Code: [email or web address] URL: [email or web address] Date: 07/14/2022 Prepared by: Faustino Congress  Exercises - Standing Lumbar Extension  - 1 x daily - 7 x weekly - 1 sets - 10 reps - Seated Piriformis Stretch with Trunk Bend  - 1 x daily - 7 x weekly - 1 sets - 3 reps - 30 sec hold - Seated Piriformis Stretch  - 1 x daily - 7 x weekly - 1 sets - 3 reps - 30 sec hold - Standing Piriformis Release with Ball at [place]  - 1 x daily - 7 x weekly - 1 sets - 1 reps - 2-3 min hold - Standing Hip Extension with Resistance at Ankles and Counter Support  - 1 x daily - 7 x weekly - 2 sets - 10 reps - Standing Hip Abduction  with Resistance at Ankles and Counter Support  - 1 x daily - 7 x weekly - 2 sets - 10 reps - Seated Hip Internal Rotation with Resistance  - 1 x daily - 7 x weekly - 2 sets - 10 reps - Seated Hip External Rotation with Resistance  - 1 x daily - 7 x weekly - 2 sets - 10 reps  ASSESSMENT:  CLINICAL IMPRESSION: Pt tolerated session well today, she does need min to mod cues for exercise review.  Will continue to benefit from PT at this time. No goals met as only 2nd visit.   OBJECTIVE IMPAIRMENTS Abnormal gait, decreased strength, increased fascial restrictions, increased muscle spasms, and pain.   ACTIVITY LIMITATIONS carrying, lifting, bending, sitting, standing, squatting, stairs, transfers, bed mobility, and locomotion level  PARTICIPATION LIMITATIONS: community activity, occupation, and yard work  PERSONAL FACTORS 3+ comorbidities: Hx rectal cancer, anxiety, depression, osteopenia, a-fib, Rt ankle fx are also affecting patient's functional outcome.   REHAB POTENTIAL: Good  CLINICAL DECISION MAKING: Evolving/moderate complexity  EVALUATION COMPLEXITY: Moderate   GOALS: Goals reviewed with patient? Yes  SHORT TERM GOALS: Target date: 08/04/2022  Independent with initial HEP Goal status: INITIAL   LONG TERM GOALS: Target date: 08/25/2022  Independent with final HEP Goal status: INITIAL  2.  FOTO score improved to 65 Goal status: INITIAL  3.  Bil hip strength improved to 4/5 for improved function and mobility Goal status: INIITAL  4.  Report pain < 3/10 with sitting for improved function and ability to tolerate work Goal status: INITIAL    PLAN: PT FREQUENCY: 1x/week  PT DURATION: 6 weeks  PLANNED INTERVENTIONS: Therapeutic exercises, Therapeutic activity, Neuromuscular re-education, Patient/Family education, Self Care, Joint mobilization, Dry  Needling, Electrical stimulation, Cryotherapy, Moist heat, Taping, Traction, Manual therapy, and Re-evaluation.  PLAN FOR  NEXT SESSION: review HEP PRN, assess response to DN and repeat PRN, needs hip strengthening   Laureen Abrahams, PT, DPT 07/25/22 8:47 AM

## 2022-07-27 ENCOUNTER — Encounter: Payer: Medicare Other | Admitting: Physical Therapy

## 2022-07-28 ENCOUNTER — Other Ambulatory Visit (HOSPITAL_COMMUNITY): Payer: Self-pay

## 2022-07-28 ENCOUNTER — Ambulatory Visit (INDEPENDENT_AMBULATORY_CARE_PROVIDER_SITE_OTHER): Payer: Medicare Other | Admitting: Ophthalmology

## 2022-07-28 ENCOUNTER — Encounter (INDEPENDENT_AMBULATORY_CARE_PROVIDER_SITE_OTHER): Payer: Self-pay | Admitting: Ophthalmology

## 2022-07-28 ENCOUNTER — Encounter (INDEPENDENT_AMBULATORY_CARE_PROVIDER_SITE_OTHER): Payer: Medicare Other | Admitting: Ophthalmology

## 2022-07-28 DIAGNOSIS — H04123 Dry eye syndrome of bilateral lacrimal glands: Secondary | ICD-10-CM | POA: Insufficient documentation

## 2022-07-28 DIAGNOSIS — H43813 Vitreous degeneration, bilateral: Secondary | ICD-10-CM

## 2022-07-28 DIAGNOSIS — H35371 Puckering of macula, right eye: Secondary | ICD-10-CM

## 2022-07-28 DIAGNOSIS — H43821 Vitreomacular adhesion, right eye: Secondary | ICD-10-CM | POA: Diagnosis not present

## 2022-07-28 NOTE — Progress Notes (Signed)
07/28/2022     CHIEF COMPLAINT Patient presents for  Chief Complaint  Patient presents with   Retina Follow Up      HISTORY OF PRESENT ILLNESS: Julie Mann is a 76 y.o. female who presents to the clinic today for:   HPI     Retina Follow Up           Diagnosis: Other   Laterality: right eye   Severity: moderate   Course: stable         Comments   1 YR FU OU OCT Pt stated, "We had a concert last week and I can tell that something is off. I'm constantly having to put eye drops in and they just feel really dry. I'm trying to look at my musical notes and I noticed that I'm having a hard time reading it, it just looks rally blurry. It has been going on for about a few months now." Pt is currently using "SOOTHE" in both eyes at least 2x daily.       Last edited by Silvestre Moment on 07/28/2022  2:56 PM.      Referring physician: Lajean Manes, [place]. [address] [place],  [place] [address]  HISTORICAL INFORMATION:   Selected notes from the MEDICAL RECORD NUMBER       CURRENT MEDICATIONS: Current Outpatient Medications (Ophthalmic Drugs)  Medication Sig   Propylene Glycol-Glycerin 0.6-0.6 % SOLN Place 1 drop into both eyes 2 (two) times daily. Soothe Dry eyes   No current facility-administered medications for this visit. (Ophthalmic Drugs)   Current Outpatient Medications (Other)  Medication Sig   acyclovir ointment (ZOVIRAX) 5 % Apply 1 application topically every 3 (three) hours. Use for up to 5 days   APPLE CIDER VINEGAR PO Take 5 mLs by mouth daily as needed (Constipation).   aspirin EC 81 MG tablet Take 1 tablet (81 mg total) by mouth daily.   atorvastatin (LIPITOR) 10 MG tablet Take 1 tablet (10 mg total) by mouth on Monday & Friday   gabapentin (NEURONTIN) 100 MG capsule take 1 capsule by mouth twice daily   HYDROcodone-acetaminophen (NORCO) 7.5-325 MG tablet Take 1 tablet by mouth every 8 (eight) hours as needed for 10 days   ibuprofen (ADVIL)  600 MG tablet Take 1 tablet (600 mg total) by mouth daily as needed for moderate pain   LORazepam (ATIVAN) 1 MG tablet Take 1 tablet (1 mg total) by mouth at bedtime as needed for anxiety or sleep.   methocarbamol (ROBAXIN) 500 MG tablet Take 1 tablet (500 mg total) by mouth every 8 (eight) hours as needed for muscle spasms.   metoprolol tartrate (LOPRESSOR) 25 MG tablet Take 25 mg by mouth daily as needed (palpitations/AFIB).    Multiple Vitamins-Minerals (MULTIVITAMIN PO) Take 1 tablet by mouth daily. One a day   OVER THE COUNTER MEDICATION Take 5 mLs by mouth daily as needed (Constipation). Olive oil   pregabalin (LYRICA) 25 MG capsule take 1-3 capsules by oral route 1 hr before bedtime   traMADol (ULTRAM) 50 MG tablet Take 1 tablet (50 mg total) by mouth every 12 (twelve) hours as needed.   triamcinolone cream (KENALOG) 0.1 % Apply topically to affected areas up to twice daily as needed. Do not apply to face, groin, or underarm   valACYclovir (VALTREX) 1000 MG tablet Take 2 tablets (2,000 mg total) by mouth at onset of symptoms; repeat dose in 12 hours as directed. Take with a full galss  of water   No current facility-administered medications for this visit. (Other)      REVIEW OF SYSTEMS: ROS   Negative for: Constitutional, Gastrointestinal, Neurological, Skin, Genitourinary, Musculoskeletal, HENT, Endocrine, Cardiovascular, Eyes, Respiratory, Psychiatric, Allergic/Imm, Heme/Lymph Last edited by Silvestre Moment on 07/28/2022  2:56 PM.       ALLERGIES Allergies  Allergen Reactions   Eliquis [Apixaban] Anaphylaxis    Made her feel sick   Ivp Dye [Iodinated Contrast Media] Swelling    01/05/2022 small amount of contrast used in Transforaminal epidural without problem.   Oxycodone Other (See Comments)    Went into Afib.    Tiotropium Bromide Monohydrate Other (See Comments)    Headache, Blurry vision, chest heaviness.   Conjugated Estrogens     Felt weird Other reaction(s): hallucinations    Doxycycline Other (See Comments)    Dizziness and tingling   Erythromycin Other (See Comments)    Hallucinations    Flonase [Fluticasone Propionate]    Keflex [Cephalexin] Other (See Comments)    Hallucinations.   Other     Other reaction(s): hallucinations Other reaction(s): sedation Other reaction(s): hallucinations   Paxil [Paroxetine Hcl] Diarrhea   Prednisone     blurry eyes   Provera [Medroxyprogesterone Acetate]     Patient do not remember reaction   Provera [Medroxyprogesterone]     Other reaction(s): hallucinations   Xarelto [Rivaroxaban]     Made her feel sick   Zithromax [Azithromycin] Other (See Comments)    hallucinations   Zoloft [Sertraline Hcl] Other (See Comments)    sedation   Zoloft [Sertraline]     Other reaction(s): sedation    PAST MEDICAL HISTORY Past Medical History:  Diagnosis Date   Anal cancer ([facility]) 2005   SCCa anus-stage II chemo, radiation   Anxiety    Arthritis    "hands, fingers, back" (02/27/2014)   Atrial fibrillation ([facility])    Cat scratch of right hand 02/24/2014   COPD (chronic obstructive pulmonary disease) ([place])    Dyspnea    walking up hill;.  stairs (If Iam tired)   Dysrhythmia    PAF   Fall from horse 01/2018   Fracture, ankle 02/2019   Right    High cholesterol    History of stomach ulcers ~ 1966   Osteopenia    Past Surgical History:  Procedure Laterality Date   ANUS SURGERY  2005   "biopsy"   BREAST CYST EXCISION Right 1966   DILATION AND CURETTAGE OF UTERUS  1980's   S/P miscarriage   EXTERNAL FIXATION LEG Right 03/10/2019   Procedure: OPEN REDUCTION INTERNAL FIXATION RIGHT ANKLE;  Surgeon: Marchia Bond, MD;  Location: [facility];  Service: Orthopedics;  Laterality: Right;   HARDWARE REMOVAL Right 02/05/2021   Procedure: REMOVAL OF HARDWARE RIGHT ANKLE;  Surgeon: Newt Minion, MD;  Location: [facility];  Service: Orthopedics;  Laterality: Right;   VARICOSE VEIN SURGERY Left    left leg   VIDEO BRONCHOSCOPY WITH  ENDOBRONCHIAL NAVIGATION Right 08/25/2020   Procedure: VIDEO BRONCHOSCOPY WITH ENDOBRONCHIAL NAVIGATION WITH FIDUCIAL PLACEMENT;  Surgeon: Garner Nash, DO;  Location: [facility];  Service: Pulmonary;  Laterality: Right;    FAMILY HISTORY Family History  Problem Relation Age of Onset   Breast cancer Maternal Aunt    Breast cancer Maternal Grandmother    Heart disease Mother    Stroke Father    Rectal cancer Paternal Grandmother    Breast cancer Sister 89   Breast cancer Sister 78  SOCIAL HISTORY Social History   Tobacco Use   Smoking status: Former    Packs/day: 1.00    Years: 30.00    Total pack years: 30.00    Types: Cigarettes    Quit date: 2005    Years since quitting: 18.7   Smokeless tobacco: Never  Vaping Use   Vaping Use: Never used  Substance Use Topics   Alcohol use: No   Drug use: No         OPHTHALMIC EXAM:  Base Eye Exam     Visual Acuity (ETDRS)       Right Left   Dist cc 20/20 -2 20/20 -2    Correction: Glasses         Tonometry (Tonopen, 3:01 PM)       Right Left   Pressure 12 13         Pupils       Pupils APD   Right PERRL None   Left PERRL None         Visual Fields       Left Right    Full Full         Extraocular Movement       Right Left    Full, Ortho Full, Ortho         Neuro/Psych     Oriented x3: Yes   Mood/Affect: Normal         Dilation     Both eyes: 1.0% Mydriacyl, 2.5% Phenylephrine @ 3:01 PM           Slit Lamp and Fundus Exam     External Exam       Right Left   External Normal Normal         Slit Lamp Exam       Right Left   Lids/Lashes Normal Normal   Conjunctiva/Sclera White and quiet White and quiet   Cornea Clear Clear   Anterior Chamber Deep and quiet Deep and quiet   Iris Round and reactive Round and reactive   Lens PC IOL PC IOL   Anterior Vitreous Vitreous clear and quiet, posterior vitreous detachment, PVD no vitreous present, vitrectomized, , clear           Fundus Exam       Right Left   Posterior Vitreous Posterior vitreous detachment Posterior vitreous detachment   Disc Normal Normal   C/D Ratio 0.1 0.1   Macula Normal post ilm removal changes,   Vessels Normal Normal   Periphery Normal Normal            IMAGING AND PROCEDURES  Imaging and Procedures for 07/28/22  OCT, Retina - OU - Both Eyes       Right Eye Quality was good. Scan locations included subfoveal. Central Foveal Thickness: 274. Findings include normal foveal contour, epiretinal membrane.   Left Eye Quality was good. Scan locations included subfoveal. Central Foveal Thickness: 304. Findings include normal foveal contour.   Notes Minor epiretinal membrane temporal portion of the macula OD and no foveal involvement will observe Minor elements of vitreal retinal attachment but no tractional changes noted OD  OS normal foveal and avitric              ASSESSMENT/PLAN:  Posterior vitreous detachment of both eyes  The nature of posterior vitreous detachment was discussed with the patient as well as its physiology, its age prevalence, and its possible implication regarding retinal breaks and detachment.  An informational brochure was  offered to the patient.  All the patient's questions were answered.  The patient was asked to return if new or different flashes or floaters develops.   Patient was instructed to contact office immediately if any new changes were noticed. I explained to the patient that vitreous inside the eye is similar to jello inside a bowl. As the jello melts it can start to pull away from the bowl, similarly the vitreous throughout our lives can begin to pull away from the retina. That process is called a posterior vitreous detachment. In some cases, the vitreous can tug hard enough on the retina to form a retinal tear. I discussed with the patient the signs and symptoms of a retinal detachment.  Do not rub the eye.    Macular pucker,  right eye Minor OD  Dry eyes, bilateral Patient does use artificial tears and has been using soothe recently.  I suggested using TheraTears and then 10 minutes to use sooth     ICD-10-CM   1. Vitreomacular adhesion of right eye  H43.821 OCT, Retina - OU - Both Eyes    2. Posterior vitreous detachment of both eyes  H43.813     3. Macular pucker, right eye  H35.371     4. Dry eyes, bilateral  H04.123       1.  PVD OU.  Stable  2.  Minor ERM not impactful on acuity  3.  Close of dry eye with wavering clear vision and not to clear vision likely indeed is an ocular surface disorder.  Instructions are to use a double artificial tear followed there soon thereafter by the use of soothe which may delay evaporative forces and thus drying of the eye  Ophthalmic Meds Ordered this visit:  No orders of the defined types were placed in this encounter.      Return in about 1 year (around 07/29/2023) for DILATE OU, OCT.  Patient Instructions  Patient to use TheraTears and then 10 minutes later follow-up with Soothe delay evaporation   Explained the diagnoses, plan, and follow up with the patient and they expressed understanding.  Patient expressed understanding of the importance of proper follow up care.   Clent Demark Anjana Cheek M.D. Diseases & Surgery of the Retina and Vitreous [facility] 07/28/22     Abbreviations: M myopia (nearsighted); A astigmatism; H hyperopia (farsighted); P presbyopia; Mrx spectacle prescription;  CTL contact lenses; OD right eye; OS left eye; OU both eyes  XT exotropia; ET esotropia; PEK punctate epithelial keratitis; PEE punctate epithelial erosions; DES dry eye syndrome; MGD meibomian gland dysfunction; ATs artificial tears; PFAT's preservative free artificial tears; [place] nuclear sclerotic cataract; PSC posterior subcapsular cataract; ERM epi-retinal membrane; PVD posterior vitreous detachment; RD retinal detachment; DM diabetes mellitus; DR  diabetic retinopathy; NPDR non-proliferative diabetic retinopathy; PDR proliferative diabetic retinopathy; CSME clinically significant macular edema; DME diabetic macular edema; dbh dot blot hemorrhages; CWS cotton wool spot; POAG primary open angle glaucoma; C/D cup-to-disc ratio; HVF humphrey visual field; GVF goldmann visual field; OCT optical coherence tomography; IOP intraocular pressure; BRVO Branch retinal vein occlusion; CRVO central retinal vein occlusion; CRAO central retinal artery occlusion; BRAO branch retinal artery occlusion; RT retinal tear; SB scleral buckle; PPV pars plana vitrectomy; VH Vitreous hemorrhage; PRP panretinal laser photocoagulation; IVK intravitreal kenalog; VMT vitreomacular traction; MH Macular hole;  NVD neovascularization of the disc; NVE neovascularization elsewhere; AREDS age related eye disease study; ARMD age related macular degeneration; POAG primary open angle glaucoma; EBMD epithelial/anterior  basement membrane dystrophy; ACIOL anterior chamber intraocular lens; IOL intraocular lens; PCIOL posterior chamber intraocular lens; Phaco/IOL phacoemulsification with intraocular lens placement; [place] photorefractive keratectomy; LASIK laser assisted in situ keratomileusis; HTN hypertension; DM diabetes mellitus; COPD chronic obstructive pulmonary disease

## 2022-07-28 NOTE — Patient Instructions (Signed)
Patient to use TheraTears and then 10 minutes later follow-up with Soothe delay evaporation

## 2022-07-28 NOTE — Assessment & Plan Note (Signed)
Patient does use artificial tears and has been using soothe recently.  I suggested using TheraTears and then 10 minutes to use sooth

## 2022-07-28 NOTE — Assessment & Plan Note (Signed)
Minor OD

## 2022-07-28 NOTE — Assessment & Plan Note (Signed)

## 2022-08-01 ENCOUNTER — Ambulatory Visit: Payer: Medicare Other | Admitting: Orthopedic Surgery

## 2022-08-02 ENCOUNTER — Encounter: Payer: Medicare Other | Admitting: Physical Therapy

## 2022-08-08 ENCOUNTER — Other Ambulatory Visit (HOSPITAL_COMMUNITY): Payer: Self-pay

## 2022-08-09 ENCOUNTER — Other Ambulatory Visit (HOSPITAL_COMMUNITY): Payer: Self-pay

## 2022-08-09 ENCOUNTER — Encounter: Payer: Self-pay | Admitting: Physical Therapy

## 2022-08-09 ENCOUNTER — Ambulatory Visit: Payer: Medicare Other | Admitting: Physical Therapy

## 2022-08-09 DIAGNOSIS — M6281 Muscle weakness (generalized): Secondary | ICD-10-CM | POA: Diagnosis not present

## 2022-08-09 DIAGNOSIS — M5459 Other low back pain: Secondary | ICD-10-CM | POA: Diagnosis not present

## 2022-08-09 MED ORDER — FLUAD QUADRIVALENT 0.5 ML IM PRSY
0.5000 mL | PREFILLED_SYRINGE | INTRAMUSCULAR | 0 refills | Status: AC
  Filled 2022-08-09: qty 0.5, 1d supply, fill #0

## 2022-08-09 NOTE — Therapy (Signed)
OUTPATIENT PHYSICAL THERAPY TREATMENT NOTE   Patient Name: Julie Mann MRN: [id number] DOB:May 20, 1946, 76 y.o., female Today's Date: 08/09/2022  END OF SESSION:   PT End of Session - 08/09/22 1105     Visit Number 3    Number of Visits 6    Date for PT Re-Evaluation 08/25/22    Authorization Type [employer] Medicare $20 copay    PT Start Time 1105    PT Stop Time 1143    PT Time Calculation (min) 38 min    Activity Tolerance Patient tolerated treatment well    Behavior During Therapy WFL for tasks assessed/performed              Past Medical History:  Diagnosis Date   Anal cancer ([facility]) 2005   SCCa anus-stage II chemo, radiation   Anxiety    Arthritis    "hands, fingers, back" (02/27/2014)   Atrial fibrillation ([place])    Cat scratch of right hand 02/24/2014   COPD (chronic obstructive pulmonary disease) ([facility])    Dyspnea    walking up hill;.  stairs (If Iam tired)   Dysrhythmia    PAF   Fall from horse 01/2018   Fracture, ankle 02/2019   Right    High cholesterol    History of stomach ulcers ~ 1966   Osteopenia    Past Surgical History:  Procedure Laterality Date   ANUS SURGERY  2005   "biopsy"   BREAST CYST EXCISION Right 1966   DILATION AND CURETTAGE OF UTERUS  1980's   S/P miscarriage   EXTERNAL FIXATION LEG Right 03/10/2019   Procedure: OPEN REDUCTION INTERNAL FIXATION RIGHT ANKLE;  Surgeon: Marchia Bond, MD;  Location: [facility];  Service: Orthopedics;  Laterality: Right;   HARDWARE REMOVAL Right 02/05/2021   Procedure: REMOVAL OF HARDWARE RIGHT ANKLE;  Surgeon: Newt Minion, MD;  Location: [facility];  Service: Orthopedics;  Laterality: Right;   VARICOSE VEIN SURGERY Left    left leg   VIDEO BRONCHOSCOPY WITH ENDOBRONCHIAL NAVIGATION Right 08/25/2020   Procedure: VIDEO BRONCHOSCOPY WITH ENDOBRONCHIAL NAVIGATION WITH FIDUCIAL PLACEMENT;  Surgeon: Garner Nash, DO;  Location: [facility];  Service: Pulmonary;  Laterality: Right;   Patient Active Problem List    Diagnosis Date Noted   Dry eyes, bilateral 07/28/2022   Low back pain 03/09/2022   Pelvic pain 03/09/2022   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   Posterior vitreous detachment of both eyes 07/27/2021   Pseudophakia of right eye 07/27/2021   Pseudophakia of left eye 07/27/2021   Vitreomacular adhesion of right eye 07/27/2021   Macular pucker, right eye 14/08/3012   Hardware complicating wound infection ([place])    Neck pain 10/05/2020   Nodule of upper lobe of right lung 08/17/2020   Biceps tendonosis of left shoulder 02/13/2020   Impingement syndrome of left shoulder 02/13/2020   Post-operative state 02/13/2020   Ankle dislocation, right, initial encounter 03/10/2019   Trimalleolar fracture of ankle, closed, right, initial encounter    Chronic left shoulder pain 02/19/2019   Anxiety 12/17/2016   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[facility]) 12/17/2016   Osteopenia 12/17/2016   Paroxysmal atrial fibrillation ([facility]) 12/17/2016   Pure hypercholesterolemia 12/17/2016   Rectal cancer ([facility]) 07/30/2015     THERAPY DIAG:  Other low back pain  Muscle weakness (generalized)   PCP: Lajean Manes, MD  REFERRING PROVIDER: Newt Minion, MD   REFERRING DIAG: [phone] (ICD-10-CM) - Chronic right-sided low back pain with right-sided sciatica   Rationale for Evaluation and  Treatment Rehabilitation  ONSET DATE: Dec 2022  SUBJECTIVE:                                                                                                                                                                                           SUBJECTIVE STATEMENT: Sore, "hurts all the time."  Reports pain doesn't seem to improve. [facility] app[facility] for potential surgery.    PERTINENT HISTORY:  Hx rectal cancer, anxiety, depression, osteopenia, a-fib, Rt ankle fx  PAIN:  Are you having pain? Yes: NPRS scale: 6 currently, up to 7, at best 4/10 Pain location: bil glutes; shoots down RLE Pain  description: aching, shooting Aggravating factors: sitting Relieving factors: biofreeze patches, aleve   PRECAUTIONS: None  WEIGHT BEARING RESTRICTIONS No  FALLS:  Has patient fallen in last 6 months? No  LIVING ENVIRONMENT: Lives with: lives with their spouse Lives in: House/apartment Stairs: Yes: inside; no difficulty  OCCUPATION: plays violin; about to get into busy season  PLOF: Independent and Leisure: yardwork, Art gallery manager, walking  PATIENT GOALS improve pain   OBJECTIVE:   DIAGNOSTIC FINDINGS:  MRI: 1. Mild progression advanced disc degeneration at L5-S1 without stenosis. 2. Mildly progressive facet hypertrophy at L4-5 without stenosis.  PATIENT SURVEYS:  07/14/22: FOTO 60 (predicted 65) 08/09/22: FOTO 58  POSTURE: rounded shoulders and forward head  PALPATION: 07/14/22: Active trigger points in bil glutes and piriformis  LUMBAR ROM:   Active  A/PROM  eval  Flexion WNL  Extension WNL  Right lateral flexion WNL  Left lateral flexion WNL  Right rotation WNL  Left rotation WNL   (Blank rows = not tested)  LOWER EXTREMITY MMT:    MMT Right eval Left eval  Hip flexion 3/5 3/5  Hip extension 3/5 3/5  Hip abduction 3/5 3/  Hip internal rotation 4/5 4/5  Hip external rotation 3/5 3/5  Knee extension 5/5 5/5   (Blank rows = not tested)   GAIT: 07/14/22 Independent with mild antalgic gait; no evidence of imbalance  TODAY'S TREATMENT  08/09/22 Therex NuStep L5 x 8 min Standing hip abduction x20 reps bil; 4# each Squats x 20 reps; bil UE support  Manual STM with compression to Rt glutes and piriformis; skilled palpation and monitoring of soft tissue during DN  Trigger Point Dry-Needling  Treatment instructions: Expect mild to moderate muscle soreness. S/S of pneumothorax if dry needled over a lung field, and to seek immediate medical attention should they occur. Patient verbalized understanding of these instructions and education.  Patient  Consent Given: Yes Education handout provided: Previously provided Muscles treated: Rt piriformis Electrical stimulation  performed: No Parameters: N/A Treatment response/outcome: multiple twitch responses  07/25/22 Therex Prone hip extension 2x10 bil; 3 sec hold Bridges x20 reps, 5 sec hold Single limb clamshells in hooklying with L3 band 2x10 Seated hip IR with L3 x20 reps Seated hip ER L3 x 20 reps  Manual STM with compression to bil glutes and piriformis; skilled palpation and monitoring of soft tissue during DN  Trigger Point Dry-Needling  Treatment instructions: Expect mild to moderate muscle soreness. S/S of pneumothorax if dry needled over a lung field, and to seek immediate medical attention should they occur. Patient verbalized understanding of these instructions and education.  Patient Consent Given: Yes Education handout provided: Previously provided Muscles treated: bil glute max Electrical stimulation performed: No Parameters: N/A Treatment response/outcome: twitch responses noted bil  07/14/22 Therex See HEP - performed trial reps PRN for comprehension and understanding  Manual STM with compression to bil glutes and piriformis; skilled palpation and monitoring of soft tissue during DN  Trigger Point Dry-Needling  Treatment instructions: Expect mild to moderate muscle soreness. S/S of pneumothorax if dry needled over a lung field, and to seek immediate medical attention should they occur. Patient verbalized understanding of these instructions and education.  Patient Consent Given: Yes Education handout provided: Previously provided Muscles treated: bil piriformis and bil glute med Electrical stimulation performed: No Parameters: N/A Treatment response/outcome: twitch responses noted bil    PATIENT EDUCATION:  Education details: HEP Person educated: Patient Education method: Consulting civil engineer, Media planner, and Handouts Education comprehension: verbalized  understanding, returned demonstration, and needs further education   HOME EXERCISE PROGRAM: Access Code: [email or web address] URL: [email or web address] Date: 07/14/2022 Prepared by: Faustino Congress  Exercises - Standing Lumbar Extension  - 1 x daily - 7 x weekly - 1 sets - 10 reps - Seated Piriformis Stretch with Trunk Bend  - 1 x daily - 7 x weekly - 1 sets - 3 reps - 30 sec hold - Seated Piriformis Stretch  - 1 x daily - 7 x weekly - 1 sets - 3 reps - 30 sec hold - Standing Piriformis Release with Ball at [place]  - 1 x daily - 7 x weekly - 1 sets - 1 reps - 2-3 min hold - Standing Hip Extension with Resistance at Ankles and Counter Support  - 1 x daily - 7 x weekly - 2 sets - 10 reps - Standing Hip Abduction with Resistance at Ankles and Counter Support  - 1 x daily - 7 x weekly - 2 sets - 10 reps - Seated Hip Internal Rotation with Resistance  - 1 x daily - 7 x weekly - 2 sets - 10 reps - Seated Hip External Rotation with Resistance  - 1 x daily - 7 x weekly - 2 sets - 10 reps  ASSESSMENT:  CLINICAL IMPRESSION: Pt with good response to DN today with significant twitch responses noted.  FOTO score decreased 2% from last visit.  May need to hold PT after next visit due to limited progress.   OBJECTIVE IMPAIRMENTS Abnormal gait, decreased strength, increased fascial restrictions, increased muscle spasms, and pain.   ACTIVITY LIMITATIONS carrying, lifting, bending, sitting, standing, squatting, stairs, transfers, bed mobility, and locomotion level  PARTICIPATION LIMITATIONS: community activity, occupation, and yard work  PERSONAL FACTORS 3+ comorbidities: Hx rectal cancer, anxiety, depression, osteopenia, a-fib, Rt ankle fx are also affecting patient's functional outcome.   REHAB POTENTIAL: Good  CLINICAL DECISION MAKING: Evolving/moderate complexity  EVALUATION COMPLEXITY: Moderate   GOALS: Goals reviewed with patient? Yes  SHORT TERM GOALS: Target date:  08/04/2022  Independent with initial HEP Goal status: MET 08/09/22   LONG TERM GOALS: Target date: 08/25/2022  Independent with final HEP Goal status: ONGOING 08/09/22  2.  FOTO score improved to 65 Goal status: ONGOING 08/09/22  3.  Bil hip strength improved to 4/5 for improved function and mobility Goal status: INITIAL  4.  Report pain < 3/10 with sitting for improved function and ability to tolerate work Goal status: INITIAL    PLAN: PT FREQUENCY: 1x/week  PT DURATION: 6 weeks  PLANNED INTERVENTIONS: Therapeutic exercises, Therapeutic activity, Neuromuscular re-education, Patient/Family education, Self Care, Joint mobilization, Dry Needling, Electrical stimulation, Cryotherapy, Moist heat, Taping, Traction, Manual therapy, and Re-evaluation.  PLAN FOR NEXT SESSION:  assess response to DN and repeat PRN, needs hip strengthening   Laureen Abrahams, PT, DPT 08/09/22 11:50 AM

## 2022-08-12 ENCOUNTER — Other Ambulatory Visit (HOSPITAL_COMMUNITY): Payer: Self-pay

## 2022-08-16 ENCOUNTER — Encounter: Payer: Self-pay | Admitting: Physical Therapy

## 2022-08-16 ENCOUNTER — Ambulatory Visit: Payer: Medicare Other | Admitting: Physical Therapy

## 2022-08-16 DIAGNOSIS — M5441 Lumbago with sciatica, right side: Secondary | ICD-10-CM

## 2022-08-16 DIAGNOSIS — M5459 Other low back pain: Secondary | ICD-10-CM | POA: Diagnosis not present

## 2022-08-16 DIAGNOSIS — G8929 Other chronic pain: Secondary | ICD-10-CM | POA: Diagnosis not present

## 2022-08-16 DIAGNOSIS — M6281 Muscle weakness (generalized): Secondary | ICD-10-CM

## 2022-08-16 NOTE — Therapy (Signed)
OUTPATIENT PHYSICAL THERAPY TREATMENT NOTE   Patient Name: Julie Mann MRN: [id number] DOB:1945-12-13, 76 y.o., female Today's Date: 08/16/2022  END OF SESSION:   PT End of Session - 08/16/22 1016     Visit Number 4    Number of Visits 6    Date for PT Re-Evaluation 08/25/22    Authorization Type [employer] Medicare $20 copay    PT Start Time 5638    PT Stop Time 7564    PT Time Calculation (min) 29 min    Activity Tolerance Patient tolerated treatment well    Behavior During Therapy WFL for tasks assessed/performed               Past Medical History:  Diagnosis Date   Anal cancer ([place]) 2005   SCCa anus-stage II chemo, radiation   Anxiety    Arthritis    "hands, fingers, back" (02/27/2014)   Atrial fibrillation ([place])    Cat scratch of right hand 02/24/2014   COPD (chronic obstructive pulmonary disease) ([place])    Dyspnea    walking up hill;.  stairs (If Iam tired)   Dysrhythmia    PAF   Fall from horse 01/2018   Fracture, ankle 02/2019   Right    High cholesterol    History of stomach ulcers ~ 1966   Osteopenia    Past Surgical History:  Procedure Laterality Date   ANUS SURGERY  2005   "biopsy"   BREAST CYST EXCISION Right 1966   DILATION AND CURETTAGE OF UTERUS  1980's   S/P miscarriage   EXTERNAL FIXATION LEG Right 03/10/2019   Procedure: OPEN REDUCTION INTERNAL FIXATION RIGHT ANKLE;  Surgeon: Marchia Bond, MD;  Location: [place];  Service: Orthopedics;  Laterality: Right;   HARDWARE REMOVAL Right 02/05/2021   Procedure: REMOVAL OF HARDWARE RIGHT ANKLE;  Surgeon: Newt Minion, MD;  Location: [place];  Service: Orthopedics;  Laterality: Right;   VARICOSE VEIN SURGERY Left    left leg   VIDEO BRONCHOSCOPY WITH ENDOBRONCHIAL NAVIGATION Right 08/25/2020   Procedure: VIDEO BRONCHOSCOPY WITH ENDOBRONCHIAL NAVIGATION WITH FIDUCIAL PLACEMENT;  Surgeon: Garner Nash, DO;  Location: [place];  Service: Pulmonary;  Laterality: Right;   Patient Active Problem List    Diagnosis Date Noted   Dry eyes, bilateral 07/28/2022   Low back pain 03/09/2022   Pelvic pain 03/09/2022   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   Posterior vitreous detachment of both eyes 07/27/2021   Pseudophakia of right eye 07/27/2021   Pseudophakia of left eye 07/27/2021   Vitreomacular adhesion of right eye 07/27/2021   Macular pucker, right eye 33/29/5188   Hardware complicating wound infection ([place])    Neck pain 10/05/2020   Nodule of upper lobe of right lung 08/17/2020   Biceps tendonosis of left shoulder 02/13/2020   Impingement syndrome of left shoulder 02/13/2020   Post-operative state 02/13/2020   Ankle dislocation, right, initial encounter 03/10/2019   Trimalleolar fracture of ankle, closed, right, initial encounter    Chronic left shoulder pain 02/19/2019   Anxiety 12/17/2016   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[place]) 12/17/2016   Osteopenia 12/17/2016   Paroxysmal atrial fibrillation ([place]) 12/17/2016   Pure hypercholesterolemia 12/17/2016   Rectal cancer ([place]) 07/30/2015     THERAPY DIAG:  Other low back pain  Muscle weakness (generalized)  Chronic right-sided low back pain with right-sided sciatica   PCP: Lajean Manes, MD  REFERRING PROVIDER: Newt Minion, MD   REFERRING DIAG: [phone] (ICD-10-CM) - Chronic right-sided low back  pain with right-sided sciatica   Rationale for Evaluation and Treatment Rehabilitation  ONSET DATE: Dec 2022  SUBJECTIVE:                                                                                                                                                                                           SUBJECTIVE STATEMENT: No significant change; "I've decided today will be my last day."  Sees spine specialist next week.  PERTINENT HISTORY:  Hx rectal cancer, anxiety, depression, osteopenia, a-fib, Rt ankle fx  PAIN:  Are you having pain? Yes: NPRS scale: 6 currently, up to 7, at best 4/10 Pain location: bil glutes;  shoots down RLE Pain description: aching, shooting Aggravating factors: sitting Relieving factors: biofreeze patches, aleve   PRECAUTIONS: None  WEIGHT BEARING RESTRICTIONS No  FALLS:  Has patient fallen in last 6 months? No  LIVING ENVIRONMENT: Lives with: lives with their spouse Lives in: House/apartment Stairs: Yes: inside; no difficulty  OCCUPATION: plays violin; about to get into busy season  PLOF: Independent and Leisure: yardwork, Art gallery manager, walking  PATIENT GOALS improve pain   OBJECTIVE:   DIAGNOSTIC FINDINGS:  MRI: 1. Mild progression advanced disc degeneration at L5-S1 without stenosis. 2. Mildly progressive facet hypertrophy at L4-5 without stenosis.  PATIENT SURVEYS:  07/14/22: FOTO 60 (predicted 65) 08/09/22: FOTO 58  POSTURE: rounded shoulders and forward head  PALPATION: 07/14/22: Active trigger points in bil glutes and piriformis  LUMBAR ROM:   Active  A/PROM  eval  Flexion WNL  Extension WNL  Right lateral flexion WNL  Left lateral flexion WNL  Right rotation WNL  Left rotation WNL   (Blank rows = not tested)  LOWER EXTREMITY MMT:    MMT Right eval Left eval Right 08/16/22 Left 08/16/22  Hip flexion 3/5 3/5 3+/5 3+/5  Hip extension 3/5 3/5 3+/5 3+/5  Hip abduction 3/5 3/5 4/5 4/5  Hip internal rotation 4/5 4/5    Hip external rotation 3/5 3/5 3+/5 3+/5  Knee extension 5/5 5/5     (Blank rows = not tested)   GAIT: 07/14/22 Independent with mild antalgic gait; no evidence of imbalance  TODAY'S TREATMENT  08/16/22 Self Care Discussed seat options and modifications for pt when playing her instrument Demonstrated TENS use application and applied to Rt piriformis while performing MMT  TherEx MMT as noted above  Modalities TENS single channel x 10 min to tolerance to Rt piriformis  08/09/22 Therex NuStep L5 x 8 min Standing hip abduction x20 reps bil; 4# each Squats x 20 reps; bil UE support  Manual STM with  compression to Rt glutes and piriformis; skilled  palpation and monitoring of soft tissue during DN  Trigger Point Dry-Needling  Treatment instructions: Expect mild to moderate muscle soreness. S/S of pneumothorax if dry needled over a lung field, and to seek immediate medical attention should they occur. Patient verbalized understanding of these instructions and education.  Patient Consent Given: Yes Education handout provided: Previously provided Muscles treated: Rt piriformis Electrical stimulation performed: No Parameters: N/A Treatment response/outcome: multiple twitch responses  07/25/22 Therex Prone hip extension 2x10 bil; 3 sec hold Bridges x20 reps, 5 sec hold Single limb clamshells in hooklying with L3 band 2x10 Seated hip IR with L3 x20 reps Seated hip ER L3 x 20 reps  Manual STM with compression to bil glutes and piriformis; skilled palpation and monitoring of soft tissue during DN  Trigger Point Dry-Needling  Treatment instructions: Expect mild to moderate muscle soreness. S/S of pneumothorax if dry needled over a lung field, and to seek immediate medical attention should they occur. Patient verbalized understanding of these instructions and education.  Patient Consent Given: Yes Education handout provided: Previously provided Muscles treated: bil glute max Electrical stimulation performed: No Parameters: N/A Treatment response/outcome: twitch responses noted bil  07/14/22 Therex See HEP - performed trial reps PRN for comprehension and understanding  Manual STM with compression to bil glutes and piriformis; skilled palpation and monitoring of soft tissue during DN  Trigger Point Dry-Needling  Treatment instructions: Expect mild to moderate muscle soreness. S/S of pneumothorax if dry needled over a lung field, and to seek immediate medical attention should they occur. Patient verbalized understanding of these instructions and education.  Patient Consent Given:  Yes Education handout provided: Previously provided Muscles treated: bil piriformis and bil glute med Electrical stimulation performed: No Parameters: N/A Treatment response/outcome: twitch responses noted bil    PATIENT EDUCATION:  Education details: HEP Person educated: Patient Education method: Consulting civil engineer, Media planner, and Handouts Education comprehension: verbalized understanding, returned demonstration, and needs further education   HOME EXERCISE PROGRAM: Access Code: [email or web address] URL: [email or web address] Date: 07/14/2022 Prepared by: Faustino Congress  Exercises - Standing Lumbar Extension  - 1 x daily - 7 x weekly - 1 sets - 10 reps - Seated Piriformis Stretch with Trunk Bend  - 1 x daily - 7 x weekly - 1 sets - 3 reps - 30 sec hold - Seated Piriformis Stretch  - 1 x daily - 7 x weekly - 1 sets - 3 reps - 30 sec hold - Standing Piriformis Release with Ball at [place]  - 1 x daily - 7 x weekly - 1 sets - 1 reps - 2-3 min hold - Standing Hip Extension with Resistance at Ankles and Counter Support  - 1 x daily - 7 x weekly - 2 sets - 10 reps - Standing Hip Abduction with Resistance at Ankles and Counter Support  - 1 x daily - 7 x weekly - 2 sets - 10 reps - Seated Hip Internal Rotation with Resistance  - 1 x daily - 7 x weekly - 2 sets - 10 reps - Seated Hip External Rotation with Resistance  - 1 x daily - 7 x weekly - 2 sets - 10 reps  ASSESSMENT:  CLINICAL IMPRESSION: Pt has only met 1 LTG at this time.  She is requesting d/c as she plans to inquire about surgical intervention for pain as PT and other conservative measures have been ineffective.  Will d/c PT today.  OBJECTIVE IMPAIRMENTS Abnormal gait, decreased strength, increased fascial restrictions, increased muscle spasms, and pain.  ACTIVITY LIMITATIONS carrying, lifting, bending, sitting, standing, squatting, stairs, transfers, bed mobility, and locomotion level  PARTICIPATION LIMITATIONS: community  activity, occupation, and yard work  PERSONAL FACTORS 3+ comorbidities: Hx rectal cancer, anxiety, depression, osteopenia, a-fib, Rt ankle fx are also affecting patient's functional outcome.   REHAB POTENTIAL: Good  CLINICAL DECISION MAKING: Evolving/moderate complexity  EVALUATION COMPLEXITY: Moderate   GOALS: Goals reviewed with patient? Yes  SHORT TERM GOALS: Target date: 08/04/2022  Independent with initial HEP Goal status: MET 08/09/22   LONG TERM GOALS: Target date: 08/25/2022  Independent with final HEP Goal status:Met 08/16/22  2.  FOTO score improved to 65 Goal status: Not Met 08/16/22  3.  Bil hip strength improved to 4/5 for improved function and mobility Goal status: Partially Met 08/16/22  4.  Report pain < 3/10 with sitting for improved function and ability to tolerate work Goal status: Not Met 08/16/22    PLAN: PT FREQUENCY: 1x/week  PT DURATION: 6 weeks  PLANNED INTERVENTIONS: Therapeutic exercises, Therapeutic activity, Neuromuscular re-education, Patient/Family education, Self Care, Joint mobilization, Dry Needling, Electrical stimulation, Cryotherapy, Moist heat, Taping, Traction, Manual therapy, and Re-evaluation.  PLAN FOR NEXT SESSION:  d.c PT  Laureen Abrahams, PT, DPT 08/16/22 10:48 AM     PHYSICAL THERAPY DISCHARGE SUMMARY  Visits from Start of Care: 4  Current functional level related to goals / functional outcomes: See above   Remaining deficits: See above   Education / Equipment: HEP, DN   Patient agrees to discharge. Patient goals were partially met. Patient is being discharged due to the patient's request.   Laureen Abrahams, PT, DPT 08/16/22 10:48 AM  [facility] [address] [place], [place], [address] Phone: [phone]   Fax:  [phone]

## 2022-08-24 ENCOUNTER — Other Ambulatory Visit (HOSPITAL_COMMUNITY): Payer: Self-pay

## 2022-08-25 ENCOUNTER — Telehealth: Payer: Self-pay | Admitting: *Deleted

## 2022-08-25 ENCOUNTER — Other Ambulatory Visit (HOSPITAL_COMMUNITY): Payer: Self-pay

## 2022-08-25 DIAGNOSIS — M4716 Other spondylosis with myelopathy, lumbar region: Secondary | ICD-10-CM | POA: Diagnosis not present

## 2022-08-25 DIAGNOSIS — M5416 Radiculopathy, lumbar region: Secondary | ICD-10-CM | POA: Diagnosis not present

## 2022-08-25 NOTE — Telephone Encounter (Signed)
   Pre-operative Risk Assessment    Patient Name: Julie Mann  DOB: [birth year] MRN: [id number]      Request for Surgical Clearance    Procedure:   L5-S1 TLIF  Date of Surgery:  Clearance TBD                                 Surgeon:  DR. MAX COHEN Surgeon's Group or Practice Name:  [facility] Phone number:  [phone] Fax number:  [phone]   Type of Clearance Requested:   - Medical ; ASA  x 7 days prior   Type of Anesthesia:  General    Additional requests/questions:    Jiles Prows   08/25/2022, 12:12 PM

## 2022-08-25 NOTE — Telephone Encounter (Signed)
   Name: Julie Mann  DOB: 1946-07-12  MRN: [id number]  Primary Cardiologist: Sinclair Grooms, MD  Chart reviewed as part of pre-operative protocol coverage. Because of [facility] past medical history and time since last visit, she will require a follow-up in-office visit in order to better assess preoperative cardiovascular risk.  Pre-op covering staff: - Please schedule appointment and call patient to inform them. If patient already had an upcoming appointment within acceptable timeframe, please add "pre-op clearance" to the appointment notes so provider is aware. - Please contact requesting surgeon's office via preferred method (i.e, phone, fax) to inform them of need for appointment prior to surgery.  She has a history of atrial fibrillation and this is why she is on aspirin.  No history CAD.  Okay to hold ASA x7 days prior to the procedure.  Please restart medically safe to do so.  Elgie Collard, PA-C  08/25/2022, 1:24 PM

## 2022-08-26 NOTE — Telephone Encounter (Signed)
Patient is scheduled to see Dr. Tamala Julian on 08/30/22. Clearance will be addressed at visit.

## 2022-08-28 NOTE — Progress Notes (Signed)
Cardiology Office Note:    Date:  08/30/2022   ID:  Julie Mann, Julie Mann 02-27-1946, MRN [id number]  PCP:  Lajean Manes, MD  Cardiologist:  Sinclair Grooms, MD   Referring MD: Lajean Manes, MD   Chief Complaint  Patient presents with   Atrial Fibrillation    History of Present Illness:    Julie Mann is a 76 y.o. female with a hx of paroxysmal atrial fibrillation and CHADS VASC score  3 (female, age) and refuses anticoagulation (Eliquis and Xarelto previously used)..  She has decided against any surgery for her piriformis syndrome.  She has had brief episodes of atrial fibrillation but no prolonged episodes.  When she does have them, she will use the metoprolol and usually gets improvement immediately.  No other cardiac complaints.  Past Medical History:  Diagnosis Date   Anal cancer ([place]) 2005   SCCa anus-stage II chemo, radiation   Anxiety    Arthritis    "hands, fingers, back" (02/27/2014)   Atrial fibrillation ([place])    Cat scratch of right hand 02/24/2014   COPD (chronic obstructive pulmonary disease) ([place])    Dyspnea    walking up hill;.  stairs (If Iam tired)   Dysrhythmia    PAF   Fall from horse 01/2018   Fracture, ankle 02/2019   Right    High cholesterol    History of stomach ulcers ~ 1966   Osteopenia     Past Surgical History:  Procedure Laterality Date   ANUS SURGERY  2005   "biopsy"   BREAST CYST EXCISION Right 1966   DILATION AND CURETTAGE OF UTERUS  1980's   S/P miscarriage   EXTERNAL FIXATION LEG Right 03/10/2019   Procedure: OPEN REDUCTION INTERNAL FIXATION RIGHT ANKLE;  Surgeon: Marchia Bond, MD;  Location: [place];  Service: Orthopedics;  Laterality: Right;   HARDWARE REMOVAL Right 02/05/2021   Procedure: REMOVAL OF HARDWARE RIGHT ANKLE;  Surgeon: Newt Minion, MD;  Location: [facility] [place] [facility];  Service: Orthopedics;  Laterality: Right;   VARICOSE VEIN SURGERY Left    left leg   VIDEO BRONCHOSCOPY WITH ENDOBRONCHIAL NAVIGATION Right  08/25/2020   Procedure: VIDEO BRONCHOSCOPY WITH ENDOBRONCHIAL NAVIGATION WITH FIDUCIAL PLACEMENT;  Surgeon: Garner Nash, DO;  Location: [place];  Service: Pulmonary;  Laterality: Right;    Current Medications: Current Meds  Medication Sig   APPLE CIDER VINEGAR PO Take 5 mLs by mouth daily as needed (Constipation).   aspirin EC 81 MG tablet Take 1 tablet (81 mg total) by mouth daily.   atorvastatin (LIPITOR) 10 MG tablet Take 1 tablet (10 mg total) by mouth on Monday & Friday   DULoxetine (CYMBALTA) 30 MG capsule Take 1 capsule (30 mg total) by mouth daily.   HYDROcodone-acetaminophen (NORCO) 7.5-325 MG tablet Take 1 tablet by mouth every 8 (eight) hours as needed for 10 days   ibuprofen (ADVIL) 600 MG tablet Take 1 tablet (600 mg total) by mouth daily as needed for moderate pain   influenza vaccine adjuvanted (FLUAD QUADRIVALENT) 0.5 ML injection Inject 0.5 mLs into the muscle.   LORazepam (ATIVAN) 1 MG tablet Take 1 tablet (1 mg total) by mouth at bedtime as needed for anxiety or sleep.   metoprolol tartrate (LOPRESSOR) 25 MG tablet Take 25 mg by mouth daily as needed (palpitations/AFIB).    Multiple Vitamins-Minerals (MULTIVITAMIN PO) Take 1 tablet by mouth daily. One a day   OVER THE COUNTER MEDICATION Take 5 mLs by mouth daily as  needed (Constipation). Olive oil   Propylene Glycol-Glycerin 0.6-0.6 % SOLN Place 1 drop into both eyes 2 (two) times daily. Soothe Dry eyes   traMADol (ULTRAM) 50 MG tablet Take 1 tablet (50 mg total) by mouth every 12 (twelve) hours as needed.   triamcinolone cream (KENALOG) 0.1 % Apply topically to affected areas up to twice daily as needed. Do not apply to face, groin, or underarm   valACYclovir (VALTREX) 1000 MG tablet Take 2 tablets (2,000 mg total) by mouth at onset of symptoms; repeat dose in 12 hours as directed. Take with a full galss of water     Allergies:   Eliquis [apixaban], Ivp dye [iodinated contrast media], Oxycodone, Tiotropium bromide  monohydrate, Conjugated estrogens, Doxycycline, Erythromycin, Flonase [fluticasone propionate], Hydrocodone, Keflex [cephalexin], Other, Paxil [paroxetine hcl], Prednisone, Provera [medroxyprogesterone acetate], Provera [medroxyprogesterone], Xarelto [rivaroxaban], Zithromax [azithromycin], Zoloft [sertraline hcl], and Zoloft [sertraline]   Social History   Socioeconomic History   Marital status: Married    Spouse name: Not on file   Number of children: Not on file   Years of education: Not on file   Highest education level: Not on file  Occupational History   Not on file  Tobacco Use   Smoking status: Former    Packs/day: 1.00    Years: 30.00    Total pack years: 30.00    Types: Cigarettes    Quit date: 2005    Years since quitting: 18.8   Smokeless tobacco: Never  Vaping Use   Vaping Use: Never used  Substance and Sexual Activity   Alcohol use: No   Drug use: No   Sexual activity: Not Currently    Partners: Male    Birth control/protection: Post-menopausal  Other Topics Concern   Not on file  Social History Narrative   Not on file   Social Determinants of Health   Financial Resource Strain: Not on file  Food Insecurity: Not on file  Transportation Needs: Not on file  Physical Activity: Not on file  Stress: Not on file  Social Connections: Not on file     Family History: The patient's family history includes Breast cancer in her maternal aunt and maternal grandmother; Breast cancer (age of onset: 48) in her sister; Breast cancer (age of onset: 60) in her sister; Heart disease in her mother; Rectal cancer in her paternal grandmother; Stroke in her father.  ROS:   Please see the history of present illness.    Right buttocks pain related to piriformis all other systems reviewed and are negative.  EKGs/Labs/Other Studies Reviewed:    The following studies were reviewed today: No new data  EKG:  EKG normal sinus rhythm, first-degree AV block (218 ms), biatrial  abnormality.  Nonspecific T wave abnormality.  Compared to January 2022, no changes noted.  Recent Labs: No results found for requested labs within last 365 days.  Recent Lipid Panel No results found for: "CHOL", "TRIG", "HDL", "CHOLHDL", "VLDL", "LDLCALC", "LDLDIRECT"  Physical Exam:    VS:  BP 130/64   Pulse 79   Ht 5' 6.5" (1.689 m)   Wt 123 lb 12.8 oz (56.2 kg)   LMP 11/14/1992   SpO2 99%   BMI 19.68 kg/m     Wt Readings from Last 3 Encounters:  08/30/22 123 lb 12.8 oz (56.2 kg)  07/05/21 132 lb 6.4 oz (60.1 kg)  05/27/21 130 lb (59 kg)     GEN: Slender. No acute distress HEENT: Normal NECK: No JVD. LYMPHATICS: No lymphadenopathy  CARDIAC: No murmur. RRR no gallop, or edema. VASCULAR:  Normal Pulses. No bruits. RESPIRATORY:  Clear to auscultation without rales, wheezing or rhonchi  ABDOMEN: Soft, non-tender, non-distended, No pulsatile mass, MUSCULOSKELETAL: No deformity  SKIN: Warm and dry NEUROLOGIC:  Alert and oriented x 3 PSYCHIATRIC:  Normal affect   ASSESSMENT:    1. Preoperative clearance   2. Paroxysmal atrial fibrillation (HCC)   3. Pure hypercholesterolemia   4. Chronic anticoagulation    PLAN:    In order of problems listed above:  She has decided against any surgery.  She would be cleared at low risk for any general anesthesia at this point. She did not want to be on anticoagulation therapy.  She is on a baby aspirin per day.  She feels episodes of atrial fibrillation a very sparse and short-lived.  She may use 3 metoprolol tablets to help assist resolution of episodes over a 69-monthtimeframe.  Not sure where her heart most recent lipid levels are.   Continue on Lipitor 10 mg/day. She has a CHA2DS2-VASc score of 2 but has refused anticoagulation.  Continue aspirin with close clinical follow-up.  Any prolonged atrial fibrillation should be reported immediately (episodes longer than 1 hour not improved with metoprolol).  She prefers to have  longitudinal cardiology follow-up in case her self determined  treatment strategy needs to be altered.  Medication Adjustments/Labs and Tests Ordered: Current medicines are reviewed at length with the patient today.  Concerns regarding medicines are outlined above.  No orders of the defined types were placed in this encounter.  No orders of the defined types were placed in this encounter.   There are no Patient Instructions on file for this visit.   Signed, HSinclair Grooms MD  08/30/2022 11:07 AM    [facility]

## 2022-08-29 ENCOUNTER — Other Ambulatory Visit: Payer: Self-pay | Admitting: Internal Medicine

## 2022-08-29 ENCOUNTER — Other Ambulatory Visit (HOSPITAL_COMMUNITY): Payer: Self-pay

## 2022-08-29 DIAGNOSIS — G8929 Other chronic pain: Secondary | ICD-10-CM

## 2022-08-29 DIAGNOSIS — G5791 Unspecified mononeuropathy of right lower limb: Secondary | ICD-10-CM | POA: Diagnosis not present

## 2022-08-29 DIAGNOSIS — G5701 Lesion of sciatic nerve, right lower limb: Secondary | ICD-10-CM

## 2022-08-29 DIAGNOSIS — Z01818 Encounter for other preprocedural examination: Secondary | ICD-10-CM | POA: Diagnosis not present

## 2022-08-29 DIAGNOSIS — M5441 Lumbago with sciatica, right side: Secondary | ICD-10-CM | POA: Diagnosis not present

## 2022-08-29 DIAGNOSIS — F419 Anxiety disorder, unspecified: Secondary | ICD-10-CM

## 2022-08-29 MED ORDER — DULOXETINE HCL 30 MG PO CPEP
30.0000 mg | ORAL_CAPSULE | Freq: Every day | ORAL | 0 refills | Status: DC
  Filled 2022-08-29: qty 30, 30d supply, fill #0

## 2022-08-30 ENCOUNTER — Ambulatory Visit: Payer: Medicare Other | Attending: Interventional Cardiology | Admitting: Interventional Cardiology

## 2022-08-30 ENCOUNTER — Encounter: Payer: Self-pay | Admitting: Interventional Cardiology

## 2022-08-30 VITALS — BP 130/64 | HR 79 | Ht 66.5 in | Wt 123.8 lb

## 2022-08-30 DIAGNOSIS — I480 Paroxysmal atrial fibrillation: Secondary | ICD-10-CM

## 2022-08-30 DIAGNOSIS — E7800 Pure hypercholesterolemia, unspecified: Secondary | ICD-10-CM | POA: Diagnosis not present

## 2022-08-30 DIAGNOSIS — Z7901 Long term (current) use of anticoagulants: Secondary | ICD-10-CM | POA: Diagnosis not present

## 2022-08-30 DIAGNOSIS — Z01818 Encounter for other preprocedural examination: Secondary | ICD-10-CM | POA: Diagnosis not present

## 2022-08-30 NOTE — Patient Instructions (Signed)
Medication Instructions:  Your physician recommends that you continue on your current medications as directed. Please refer to the Current Medication list given to you today.  *If you need a refill on your cardiac medications before your next appointment, please call your pharmacy*  Lab Work: NONE  Testing/Procedures: NONE  Follow-Up: At [facility], you and your health needs are our priority.  As part of our continuing mission to provide you with exceptional heart care, we have created designated Provider Care Teams.  These Care Teams include your primary Cardiologist (physician) and Advanced Practice Providers (APPs -  Physician Assistants and Nurse Practitioners) who all work together to provide you with the care you need, when you need it.  Your next appointment:   1 year(s)  The format for your next appointment:   In Person  Provider:   Henry W Smith III, MD    Important Information About Sugar       

## 2022-09-12 ENCOUNTER — Ambulatory Visit: Payer: Medicare Other | Admitting: Orthopedic Surgery

## 2022-09-12 DIAGNOSIS — M5441 Lumbago with sciatica, right side: Secondary | ICD-10-CM

## 2022-09-12 DIAGNOSIS — G8929 Other chronic pain: Secondary | ICD-10-CM | POA: Diagnosis not present

## 2022-09-19 ENCOUNTER — Other Ambulatory Visit (HOSPITAL_COMMUNITY): Payer: Self-pay

## 2022-09-27 ENCOUNTER — Encounter: Payer: Self-pay | Admitting: Orthopedic Surgery

## 2022-09-27 NOTE — Progress Notes (Signed)
Office Visit Note   Patient: Julie Mann           Date of Birth: [birth year]           MRN: [id number] Visit Date: 09/12/2022              Requested by: Lajean Manes, MD [address] [place],  [place] [address] PCP: Lajean Manes, MD  Chief Complaint  Patient presents with   Lower Back - Pain    Right sided back pain      HPI: Patient is a 76 year old woman who presents in follow-up for right-sided radicular symptoms.  Patient states that her problems are coming from the piriformis muscle and she wants surgery on her piriformis muscle.  Patient has lower back pain that radiates to the right foot.  Patient states that her sister has had 3 spine surgeries at neurosurgery in [place].  Patient has had 2 epidural steroid injections with Dr. Ernestina Patches without relief.  Patient states she has had injections at neurosurgery as well.  Patient states she has done therapy and has also been to spine and scoliosis.  Assessment & Plan: Visit Diagnoses:  1. Chronic right-sided low back pain with right-sided sciatica     Plan: Recommended patient follow-up with neurosurgery in [facility] to see if there are surgical options available for her.  Follow-Up Instructions: No follow-ups on file.   Ortho Exam  Patient is alert, oriented, no adenopathy, well-dressed, normal affect, normal respiratory effort. Examination MRI scan of her pelvis in May was negative.  She had an MRI scan in January which showed degenerative disc disease at L5-S1 and facet arthropathy at L4-5.  Examination she has negative straight leg raise and no focal motor weakness.  No pain reproduced with internal or external rotation of the hip.  Imaging: No results found. No images are attached to the encounter.  Labs: Lab Results  Component Value Date   REPTSTATUS 02/10/2021 FINAL 02/05/2021   GRAMSTAIN  02/05/2021    MODERATE WBC PRESENT,BOTH PMN AND MONONUCLEAR NO ORGANISMS SEEN    CULT   02/05/2021    RARE STAPHYLOCOCCUS CAPITIS RARE STAPHYLOCOCCUS EPIDERMIDIS NO ANAEROBES ISOLATED Performed at [facility], [address], [place], [place] [address]    [place] 02/05/2021   LABORGA STAPHYLOCOCCUS EPIDERMIDIS 02/05/2021     Lab Results  Component Value Date   ALBUMIN 3.6 08/25/2020   ALBUMIN 4.1 03/24/2009   ALBUMIN 3.9 09/24/2008    No results found for: "MG" No results found for: "VD25OH"  No results found for: "PREALBUMIN"    Latest Ref Rng & Units 02/05/2021    7:01 AM 08/25/2020    6:44 AM 03/10/2019    8:23 AM  CBC EXTENDED  WBC 4.0 - 10.5 K/uL 7.5  6.6  13.1   RBC 3.87 - 5.11 MIL/uL 5.22  4.76  4.70   Hemoglobin 12.0 - 15.0 g/dL 13.9  12.6  12.8   HCT 36.0 - 46.0 % 45.2  40.5  40.5   Platelets 150 - 400 K/uL 391  321  287      There is no height or weight on file to calculate BMI.  Orders:  No orders of the defined types were placed in this encounter.  No orders of the defined types were placed in this encounter.    Procedures: No procedures performed  Clinical Data: No additional findings.  ROS:  All other systems negative, except as noted in the  HPI. Review of Systems  Objective: Vital Signs: LMP 11/14/1992   Specialty Comments:  EXAM: MRI LUMBAR SPINE WITHOUT CONTRAST   TECHNIQUE: Multiplanar, multisequence MR imaging of the lumbar spine was performed. No intravenous contrast was administered.   COMPARISON:  Lumbar spine MRI 05/30/2012   FINDINGS: Segmentation:  Standard.   Alignment: Slight lower lumbar levoscoliosis. No significant listhesis.   Vertebrae: No fracture or suspicious marrow lesion. Mild degenerative endplate edema at T0-Z6.   Conus medullaris and cauda equina: Conus extends to the T12 level and is suboptimally evaluated as it was not covered on axial images. Unremarkable appearance of the cauda equina.   Paraspinal and other soft tissues: Small renal cysts.   Disc  levels:   Disc desiccation throughout the lumbar spine with exception of L3-4. Progressive, severe disc space narrowing at L5-S1.   L1-2: Negative.   L2-3: At most minimal disc bulging without stenosis.   L3-4: Minimal leftward disc bulging without stenosis.   L4-5: Disc bulging and moderate facet and ligamentum flavum hypertrophy, mildly progressed but without associated stenosis. Small synovial cysts posterior to the facet joints bilaterally, not in a position to cause neural impingement.   L5-S1: Disc bulging, endplate spurring, disc space height loss, and mild facet hypertrophy without significant stenosis.   IMPRESSION: 1. Mild progression advanced disc degeneration at L5-S1 without stenosis. 2. Mildly progressive facet hypertrophy at L4-5 without stenosis.     Electronically Signed   By: Logan Bores M.D.   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   Posterior vitreous detachment of both eyes 07/27/2021   Pseudophakia of right eye 07/27/2021   Pseudophakia of left eye 07/27/2021   Vitreomacular adhesion of right eye 07/27/2021   Macular pucker, right eye 11/22/3233   Hardware complicating wound infection ([place])    Neck pain 10/05/2020   Nodule of upper lobe of right lung 08/17/2020   Biceps tendonosis of left shoulder 02/13/2020   Impingement syndrome of left shoulder 02/13/2020   Post-operative state 02/13/2020   Ankle dislocation, right, initial encounter 03/10/2019   Trimalleolar fracture of ankle, closed, right, initial encounter    Chronic left shoulder pain 02/19/2019   Anxiety 12/17/2016   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  stairs (If Iam tired)   Dysrhythmia    PAF   Fall from horse 01/2018   Fracture, ankle 02/2019   Right    High cholesterol    History of stomach ulcers ~ 1966   Osteopenia     Family History  Problem Relation Age of Onset   Breast cancer Maternal Aunt    Breast cancer Maternal Grandmother    Heart disease Mother    Stroke Father    Rectal cancer Paternal Grandmother    Breast cancer Sister 38   Breast cancer  Sister 53    Past Surgical History:  Procedure Laterality Date   ANUS SURGERY  2005   "biopsy"   BREAST CYST EXCISION Right 1966   DILATION AND CURETTAGE OF UTERUS  1980's   S/P miscarriage   EXTERNAL FIXATION LEG Right 03/10/2019   Procedure: OPEN REDUCTION INTERNAL FIXATION RIGHT ANKLE;  Surgeon: Marchia Bond, MD;  Location: [facility];  Service: Orthopedics;  Laterality: Right;   HARDWARE REMOVAL Right 02/05/2021   Procedure: REMOVAL OF HARDWARE RIGHT ANKLE;  Surgeon: Newt Minion, MD;  Location: [facility];  Service: Orthopedics;  Laterality: Right;   VARICOSE VEIN SURGERY Left    left leg   VIDEO BRONCHOSCOPY WITH ENDOBRONCHIAL NAVIGATION Right 08/25/2020   Procedure: VIDEO BRONCHOSCOPY WITH ENDOBRONCHIAL NAVIGATION WITH FIDUCIAL PLACEMENT;  Surgeon: Garner Nash, DO;  Location: [facility];  Service: Pulmonary;  Laterality: Right;   Social History   Occupational History   Not on file  Tobacco Use   Smoking status: Former    Packs/day: 1.00    Years: 30.00    Total pack years: 30.00    Types: Cigarettes    Quit date: 2005    Years since quitting: 18.8   Smokeless tobacco: Never  Vaping Use   Vaping Use: Never used  Substance and Sexual Activity    Alcohol use: No   Drug use: No   Sexual activity: Not Currently    Partners: Male    Birth control/protection: Post-menopausal

## 2022-10-03 ENCOUNTER — Other Ambulatory Visit: Payer: Self-pay | Admitting: Orthopedic Surgery

## 2022-10-03 ENCOUNTER — Other Ambulatory Visit (HOSPITAL_COMMUNITY): Payer: Self-pay

## 2022-10-03 MED ORDER — TRAMADOL HCL 50 MG PO TABS
50.0000 mg | ORAL_TABLET | Freq: Two times a day (BID) | ORAL | 0 refills | Status: DC | PRN
  Filled 2022-10-03: qty 30, 15d supply, fill #0

## 2022-10-04 ENCOUNTER — Other Ambulatory Visit (HOSPITAL_COMMUNITY): Payer: Self-pay

## 2022-10-05 ENCOUNTER — Other Ambulatory Visit: Payer: Medicare Other

## 2022-10-19 ENCOUNTER — Other Ambulatory Visit: Payer: Self-pay | Admitting: Obstetrics & Gynecology

## 2022-10-19 DIAGNOSIS — M5416 Radiculopathy, lumbar region: Secondary | ICD-10-CM | POA: Diagnosis not present

## 2022-10-19 DIAGNOSIS — F419 Anxiety disorder, unspecified: Secondary | ICD-10-CM

## 2022-10-20 ENCOUNTER — Other Ambulatory Visit (HOSPITAL_COMMUNITY): Payer: Self-pay

## 2022-10-21 ENCOUNTER — Other Ambulatory Visit (HOSPITAL_COMMUNITY): Payer: Self-pay

## 2022-10-21 MED ORDER — LORAZEPAM 1 MG PO TABS
1.0000 mg | ORAL_TABLET | Freq: Every evening | ORAL | 1 refills | Status: DC | PRN
  Filled 2022-10-21: qty 30, 30d supply, fill #0
  Filled 2022-11-22: qty 30, 30d supply, fill #1

## 2022-11-12 DIAGNOSIS — M5416 Radiculopathy, lumbar region: Secondary | ICD-10-CM | POA: Diagnosis not present

## 2022-11-12 DIAGNOSIS — M545 Low back pain, unspecified: Secondary | ICD-10-CM | POA: Diagnosis not present

## 2022-11-16 ENCOUNTER — Other Ambulatory Visit: Payer: Self-pay | Admitting: Obstetrics & Gynecology

## 2022-11-16 ENCOUNTER — Other Ambulatory Visit (HOSPITAL_COMMUNITY): Payer: Self-pay

## 2022-11-17 ENCOUNTER — Other Ambulatory Visit (HOSPITAL_COMMUNITY): Payer: Self-pay

## 2022-11-17 MED ORDER — IBUPROFEN 600 MG PO TABS
600.0000 mg | ORAL_TABLET | Freq: Every day | ORAL | 1 refills | Status: DC | PRN
  Filled 2022-11-17: qty 30, 30d supply, fill #0
  Filled 2023-01-07: qty 30, 30d supply, fill #1

## 2022-11-23 ENCOUNTER — Other Ambulatory Visit: Payer: Self-pay

## 2022-12-05 ENCOUNTER — Other Ambulatory Visit: Payer: Self-pay | Admitting: Internal Medicine

## 2022-12-05 DIAGNOSIS — Z1231 Encounter for screening mammogram for malignant neoplasm of breast: Secondary | ICD-10-CM

## 2022-12-07 ENCOUNTER — Ambulatory Visit
Admission: RE | Admit: 2022-12-07 | Discharge: 2022-12-07 | Disposition: A | Discharge status: Home / Self Care | Payer: Medicare Other | Source: Ambulatory Visit | Attending: Internal Medicine | Admitting: Internal Medicine

## 2022-12-07 DIAGNOSIS — Z1231 Encounter for screening mammogram for malignant neoplasm of breast: Secondary | ICD-10-CM

## 2022-12-08 ENCOUNTER — Other Ambulatory Visit (HOSPITAL_COMMUNITY): Payer: Self-pay

## 2022-12-08 ENCOUNTER — Encounter (HOSPITAL_BASED_OUTPATIENT_CLINIC_OR_DEPARTMENT_OTHER): Payer: Self-pay | Admitting: Obstetrics & Gynecology

## 2022-12-08 ENCOUNTER — Ambulatory Visit (INDEPENDENT_AMBULATORY_CARE_PROVIDER_SITE_OTHER): Payer: Medicare Other | Admitting: Obstetrics & Gynecology

## 2022-12-08 VITALS — BP 164/76 | HR 75 | Ht 67.0 in | Wt 119.0 lb

## 2022-12-08 DIAGNOSIS — Z9189 Other specified personal risk factors, not elsewhere classified: Secondary | ICD-10-CM

## 2022-12-08 DIAGNOSIS — I10 Essential (primary) hypertension: Secondary | ICD-10-CM | POA: Diagnosis not present

## 2022-12-08 DIAGNOSIS — Z79899 Other long term (current) drug therapy: Secondary | ICD-10-CM

## 2022-12-08 DIAGNOSIS — Z85048 Personal history of other malignant neoplasm of rectum, rectosigmoid junction, and anus: Secondary | ICD-10-CM | POA: Diagnosis not present

## 2022-12-08 DIAGNOSIS — G5700 Lesion of sciatic nerve, unspecified lower limb: Secondary | ICD-10-CM | POA: Insufficient documentation

## 2022-12-08 DIAGNOSIS — A609 Anogenital herpesviral infection, unspecified: Secondary | ICD-10-CM

## 2022-12-08 DIAGNOSIS — I480 Paroxysmal atrial fibrillation: Secondary | ICD-10-CM

## 2022-12-08 DIAGNOSIS — M5416 Radiculopathy, lumbar region: Secondary | ICD-10-CM | POA: Insufficient documentation

## 2022-12-08 DIAGNOSIS — F419 Anxiety disorder, unspecified: Secondary | ICD-10-CM

## 2022-12-08 LAB — COMPREHENSIVE METABOLIC PANEL
ALT: 9 IU/L (ref 0–32)
AST: 19 IU/L (ref 0–40)
Albumin/Globulin Ratio: 1.8 (ref 1.2–2.2)
Albumin: 4.2 g/dL (ref 3.8–4.8)
Alkaline Phosphatase: 51 IU/L (ref 44–121)
BUN/Creatinine Ratio: 18 (ref 12–28)
BUN: 16 mg/dL (ref 8–27)
Bilirubin Total: 0.4 mg/dL (ref 0.0–1.2)
CO2: 22 mmol/L (ref 20–29)
Calcium: 10 mg/dL (ref 8.7–10.3)
Chloride: 100 mmol/L (ref 96–106)
Creatinine, Ser: 0.91 mg/dL (ref 0.57–1.00)
Globulin, Total: 2.4 g/dL (ref 1.5–4.5)
Glucose: 88 mg/dL (ref 70–99)
Potassium: 4.3 mmol/L (ref 3.5–5.2)
Sodium: 138 mmol/L (ref 134–144)
Total Protein: 6.6 g/dL (ref 6.0–8.5)
eGFR: 65 mL/min/{1.73_m2} (ref >59)

## 2022-12-08 MED ORDER — LORAZEPAM 1 MG PO TABS
1.0000 mg | ORAL_TABLET | Freq: Every evening | ORAL | 2 refills | Status: DC | PRN
  Filled 2022-12-08 – 2022-12-20 (×2): qty 30, 30d supply, fill #0
  Filled 2023-01-18: qty 30, 30d supply, fill #1
  Filled 2023-02-08 – 2023-02-15 (×6): qty 30, 30d supply, fill #2

## 2022-12-08 NOTE — Progress Notes (Signed)
77 y.o. G16P0041 Married White or Caucasian female here for breast and pelvic exam.  I am also following her for h/o rectal cancer.  Had steroid injection due to her sciatica issues.  She had diarrhea for several days.  This has resolved.  Denies vaginal bleeding.  Uses lorazepam 1/2 tab nightly.  Doesn't take every night.  Would like rx sent to pharmacy but not filled right now.    Does not need valtrex rx.  Patient's last menstrual period was 11/14/1992.          Sexually active: No.  H/O STD:  buttocks HSV  Health Maintenance: PCP:  Reina Fuse PCP.  Pt has not had wellness exam form new PCP. Vaccines are up to date:  is not going to have another covid vaccination Colonoscopy:  2022 with Dr. Cristina Gong.  Follow up 5 years recommended. MMG:  done yesterday.  No result yet. BMD:  last one I have was 2014.  She feels this has been done with PCP Last pap smear:  2020 neg.   H/o abnormal pap smear:      reports that she quit smoking about 19 years ago. Her smoking use included cigarettes. She has a 30.00 pack-year smoking history. She has never used smokeless tobacco. She reports that she does not drink alcohol and does not use drugs.  Past Medical History:  Diagnosis Date   Anal cancer ([place]) 2005   SCCa anus-stage II chemo, radiation   Anxiety    Arthritis    "hands, fingers, back" (02/27/2014)   Atrial fibrillation ([place])    Cat scratch of right hand 02/24/2014   COPD (chronic obstructive pulmonary disease) ([place])    Dyspnea    walking up hill;.  stairs (If Iam tired)   Dysrhythmia    PAF   Fall from horse 01/2018   Fracture, ankle 02/2019   Right    High cholesterol    History of stomach ulcers ~ 1966   Osteopenia     Past Surgical History:  Procedure Laterality Date   ANUS SURGERY  2005   "biopsy"   BREAST CYST EXCISION Right 1966   DILATION AND CURETTAGE OF UTERUS  1980's   S/P miscarriage   EXTERNAL FIXATION LEG Right 03/10/2019   Procedure: OPEN REDUCTION INTERNAL  FIXATION RIGHT ANKLE;  Surgeon: Marchia Bond, MD;  Location: [facility];  Service: Orthopedics;  Laterality: Right;   HARDWARE REMOVAL Right 02/05/2021   Procedure: REMOVAL OF HARDWARE RIGHT ANKLE;  Surgeon: Newt Minion, MD;  Location: [facility];  Service: Orthopedics;  Laterality: Right;   VARICOSE VEIN SURGERY Left    left leg   VIDEO BRONCHOSCOPY WITH ENDOBRONCHIAL NAVIGATION Right 08/25/2020   Procedure: VIDEO BRONCHOSCOPY WITH ENDOBRONCHIAL NAVIGATION WITH FIDUCIAL PLACEMENT;  Surgeon: Garner Nash, DO;  Location: [facility];  Service: Pulmonary;  Laterality: Right;    Current Outpatient Medications  Medication Sig Dispense Refill   APPLE CIDER VINEGAR PO Take 5 mLs by mouth daily as needed (Constipation).     aspirin EC 81 MG tablet Take 1 tablet (81 mg total) by mouth daily. 90 tablet 3   atorvastatin (LIPITOR) 10 MG tablet Take 1 tablet (10 mg total) by mouth on Monday & Friday 23 tablet 3   HYDROcodone-acetaminophen (NORCO) 7.5-325 MG tablet Take 1 tablet by mouth every 8 (eight) hours as needed for 10 days 30 tablet 0   ibuprofen (ADVIL) 600 MG tablet Take 1 tablet (600 mg total) by mouth daily as needed for  moderate pain 30 tablet 1   influenza vaccine adjuvanted (FLUAD QUADRIVALENT) 0.5 ML injection Inject 0.5 mLs into the muscle. 0.5 mL 0   metoprolol tartrate (LOPRESSOR) 25 MG tablet Take 25 mg by mouth daily as needed (palpitations/AFIB).      Multiple Vitamins-Minerals (MULTIVITAMIN PO) Take 1 tablet by mouth daily. One a day     OVER THE COUNTER MEDICATION Take 5 mLs by mouth daily as needed (Constipation). Olive oil     Propylene Glycol-Glycerin 0.6-0.6 % SOLN Place 1 drop into both eyes 2 (two) times daily. Soothe Dry eyes     traMADol (ULTRAM) 50 MG tablet Take 1 tablet (50 mg total) by mouth every 12 (twelve) hours as needed. 30 tablet 0   triamcinolone cream (KENALOG) 0.1 % Apply topically to affected areas up to twice daily as needed. Do not apply to face, groin, or underarm  454 g 3   valACYclovir (VALTREX) 1000 MG tablet Take 2 tablets (2,000 mg total) by mouth at onset of symptoms; repeat dose in 12 hours as directed. Take with a full galss of water 10 tablet 3   LORazepam (ATIVAN) 1 MG tablet Take 1 tablet (1 mg total) by mouth at bedtime as needed for anxiety or sleep. 30 tablet 2   No current facility-administered medications for this visit.    Family History  Problem Relation Age of Onset   Breast cancer Maternal Aunt    Breast cancer Maternal Grandmother    Heart disease Mother    Stroke Father    Rectal cancer Paternal Grandmother    Breast cancer Sister 44   Breast cancer Sister 57    Review of Systems  Constitutional: Negative.   Genitourinary: Negative.     Exam:   BP (!) 164/76 (BP Location: Right Arm, Patient Position: Sitting, Cuff Size: Normal)   Pulse 75   Ht '5\' 7"'$  (1.702 m) Comment: Reported  Wt 119 lb (54 kg)   LMP 11/14/1992   BMI 18.64 kg/m   Height: '5\' 7"'$  (170.2 cm) (Reported)  General appearance: alert, cooperative and appears stated age Head: Normocephalic, without obvious abnormality, atraumatic Neck: no adenopathy, supple, symmetrical, trachea midline and thyroid normal to inspection and palpation Lungs: clear to auscultation bilaterally Breasts: normal appearance, no masses or tenderness Heart: regular rate and rhythm Abdomen: soft, non-tender; bowel sounds normal; no masses,  no organomegaly Extremities: extremities normal, atraumatic, no cyanosis or edema Skin: Skin color, texture, turgor normal. No rashes or lesions Lymph nodes: Cervical, supraclavicular, and axillary nodes normal. No abnormal inguinal nodes palpated Neurologic: Grossly normal   Pelvic: External genitalia:  no lesions              Urethra:  normal appearing urethra with no masses, tenderness or lesions              Bartholins and Skenes: normal                 Vagina: normal appearing vagina with normal color and no discharge, no lesions               Cervix: no lesions              Pap taken: No. Bimanual Exam:  Uterus:  normal size, contour, position, consistency, mobility, non-tender              Adnexa: normal adnexa               Rectovaginal: Confirms  Anus:  normal sphincter tone, no lesions  Chaperone, Octaviano Batty, CMA, was present for exam.  Assessment/Plan: 1. GYN exam for high-risk Medicare patient - Pap smear 2020.  Will consider repeating with HR HPV next year due to hx of anal cancer - Mammogram done yesterday - Colonoscopy 2022 - Bone mineral density results are not on EPIC - lab work:  CMP ordered today - vaccines reviewed/updated  2. Elevated blood pressure reading with diagnosis of hypertension - pt will check at home  3. History of rectal cancer  4. Anxiety - LORazepam (ATIVAN) 1 MG tablet; Take 1 tablet (1 mg total) by mouth at bedtime as needed for anxiety or sleep.  Dispense: 30 tablet; Refill: 2  5. Paroxysmal atrial fibrillation (HCC) - on metoprolol  6. HSV (herpes simplex virus) anogenital infection - does not need rx for valtrex

## 2022-12-13 ENCOUNTER — Other Ambulatory Visit (HOSPITAL_COMMUNITY): Payer: Self-pay

## 2022-12-15 ENCOUNTER — Encounter: Payer: Self-pay | Admitting: *Deleted

## 2022-12-20 ENCOUNTER — Other Ambulatory Visit (HOSPITAL_COMMUNITY): Payer: Self-pay

## 2022-12-29 ENCOUNTER — Other Ambulatory Visit: Payer: Self-pay | Admitting: Internal Medicine

## 2022-12-29 DIAGNOSIS — M8589 Other specified disorders of bone density and structure, multiple sites: Secondary | ICD-10-CM

## 2022-12-31 ENCOUNTER — Other Ambulatory Visit (HOSPITAL_COMMUNITY): Payer: Self-pay

## 2023-01-02 ENCOUNTER — Other Ambulatory Visit (HOSPITAL_COMMUNITY): Payer: Self-pay

## 2023-01-02 MED ORDER — ATORVASTATIN CALCIUM 10 MG PO TABS
10.0000 mg | ORAL_TABLET | ORAL | 3 refills | Status: DC
  Filled 2023-01-02: qty 26, 91d supply, fill #0
  Filled 2023-01-03: qty 26, 90d supply, fill #0
  Filled 2023-01-07: qty 26, 91d supply, fill #0
  Filled 2023-01-09: qty 26, 90d supply, fill #0
  Filled 2023-04-06: qty 26, 90d supply, fill #1

## 2023-01-03 ENCOUNTER — Other Ambulatory Visit (HOSPITAL_COMMUNITY): Payer: Self-pay

## 2023-01-09 ENCOUNTER — Other Ambulatory Visit: Payer: Self-pay

## 2023-01-09 ENCOUNTER — Other Ambulatory Visit (HOSPITAL_COMMUNITY): Payer: Self-pay

## 2023-01-10 ENCOUNTER — Other Ambulatory Visit (HOSPITAL_COMMUNITY): Payer: Self-pay

## 2023-01-18 ENCOUNTER — Other Ambulatory Visit: Payer: Self-pay

## 2023-01-25 IMAGING — MR MR PELVIS W/O CM
4 of 5 series · 26 of 48 positions shown · non-contrast
Comparison: AP pelvis 07/09/2022

CLINICAL DATA: Lower back pain radiating into the right hip.
Symptoms since [DATE]. History of anal cancer in 4950.

EXAM:
MRI PELVIS WITHOUT CONTRAST
TECHNIQUE: Multiplanar multisequence MR imaging of the pelvis was performed. No
intravenous contrast was administered.

[Series 3: STIR · coronal · 3.0mm · 1.25mm/px · 8 of 35 slices shown]
[im 1/35]
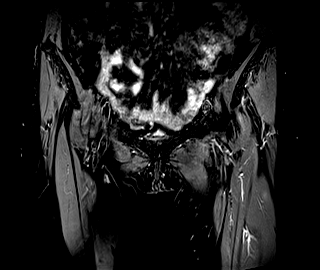
[im 4/35]
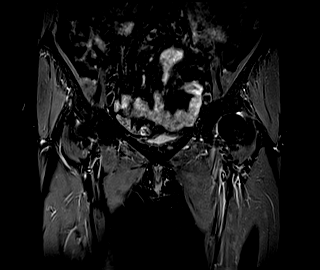
[im 12/35]
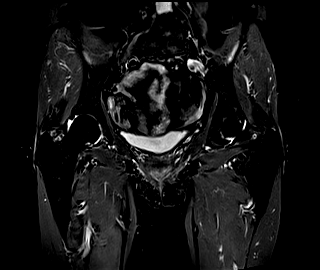
[im 16/35]
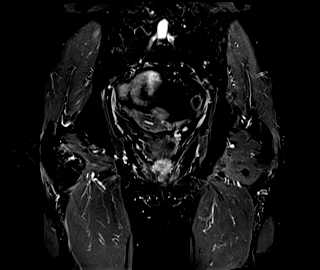
[im 19/35]
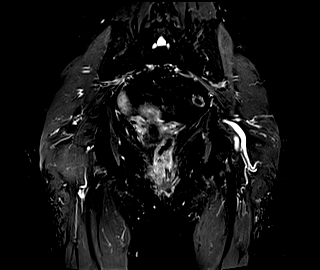
[im 23/35]
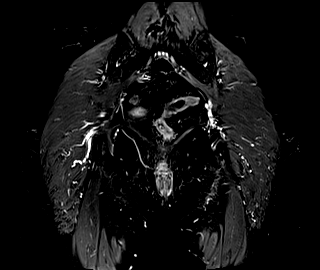
[im 31/35]
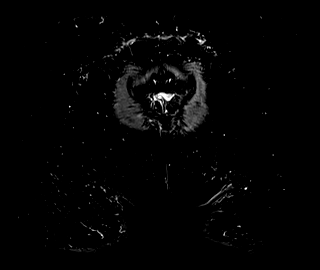
[im 35/35]
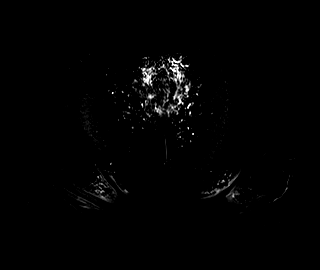

[Series 4: T1 · coronal · 3.0mm · 0.78mm/px · 8 of 35 slices shown (1 of 2)]
[im 1/35]
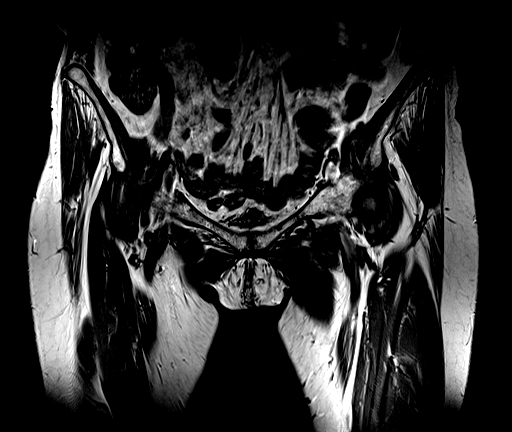
[im 4/35]
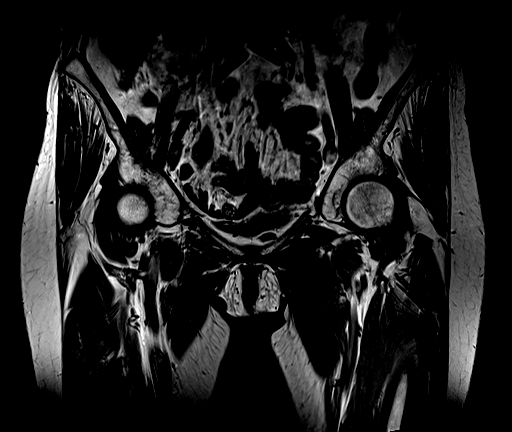
[im 12/35]
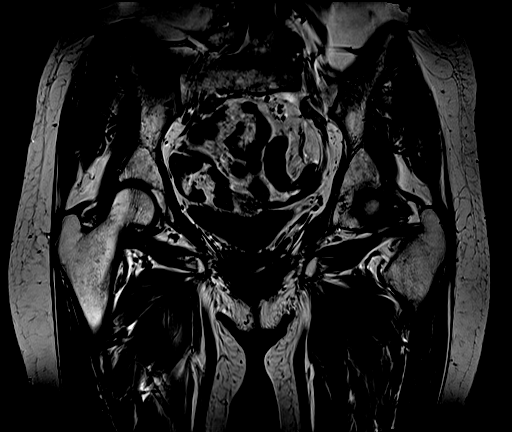
[im 16/35]
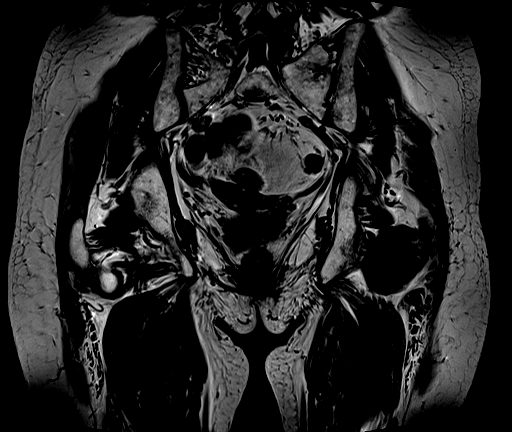
[im 19/35]
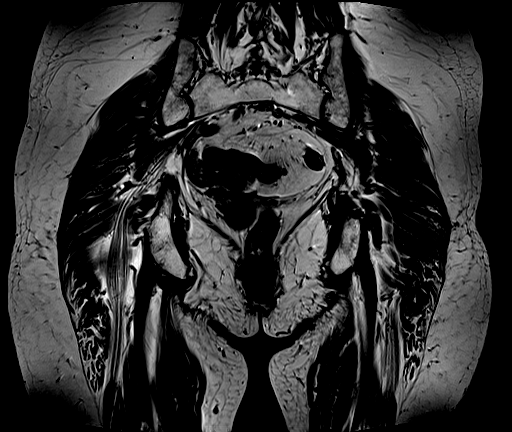
[im 23/35]
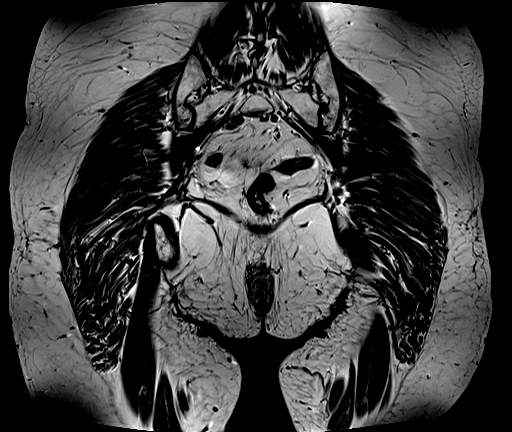
[im 31/35]
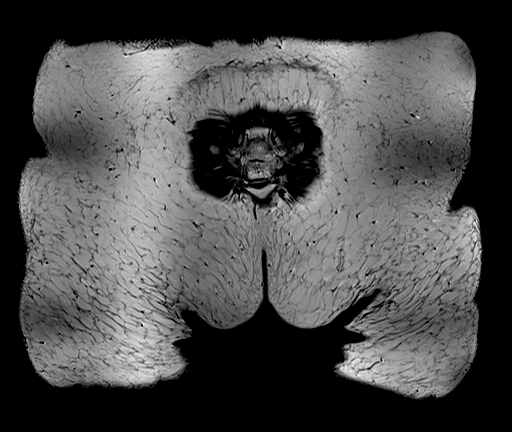
[im 35/35]
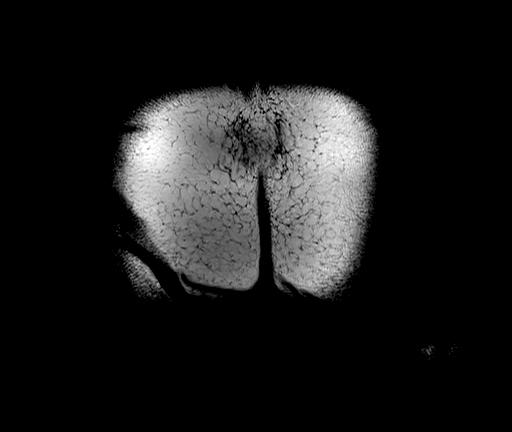

[Series 5: T1 · axial · 5.0mm · 0.78mm/px · z∈[-66,+102]mm · 7 of 30 slices shown (2 of 2)]
[im 1/30]
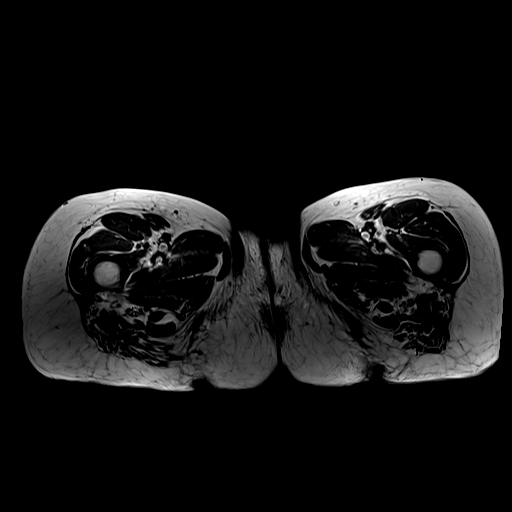
[im 5/30]
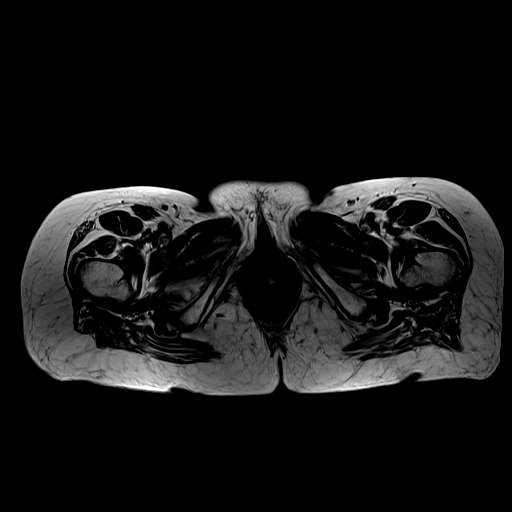
[im 9/30]
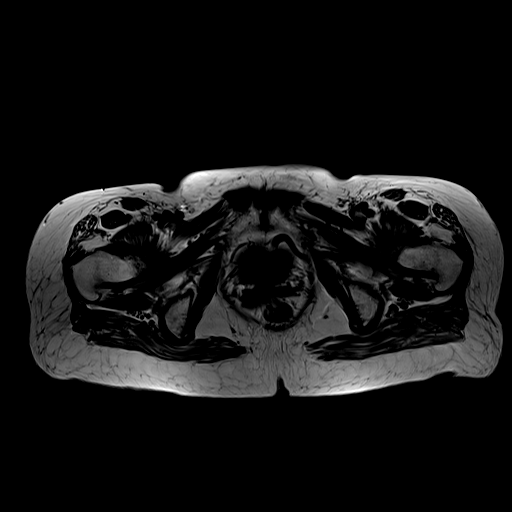
[im 13/30]
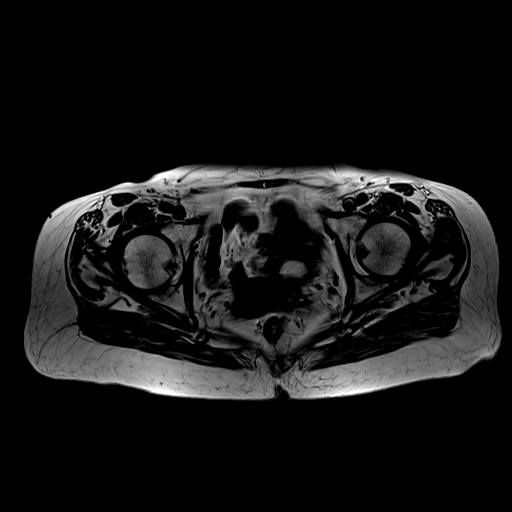
[im 17/30]
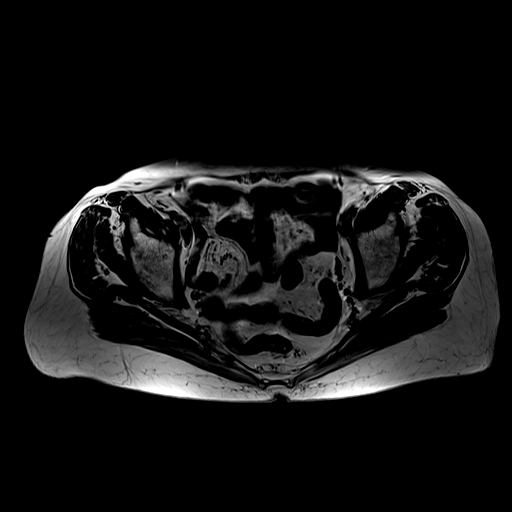
[im 21/30]
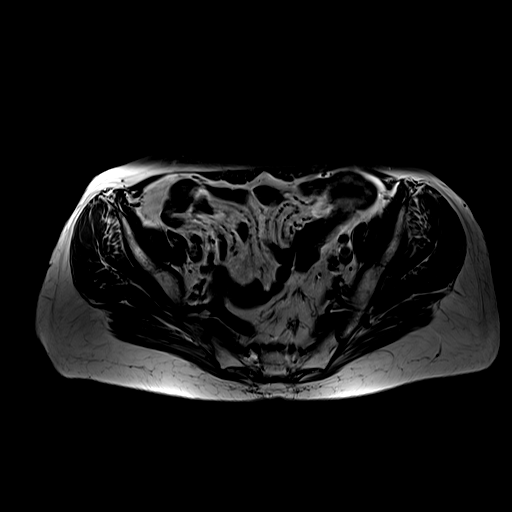
[im 25/30]
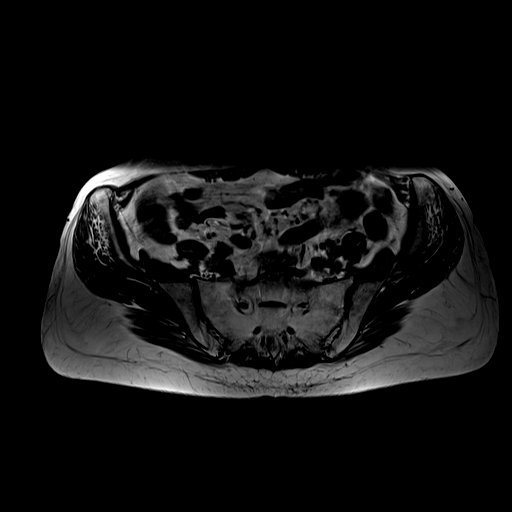

[Series 6: T2 fat-sat · axial · 5.0mm · 0.78mm/px · z∈[-38,+102]mm · 3 of 30 slices shown]
[im 5/30]
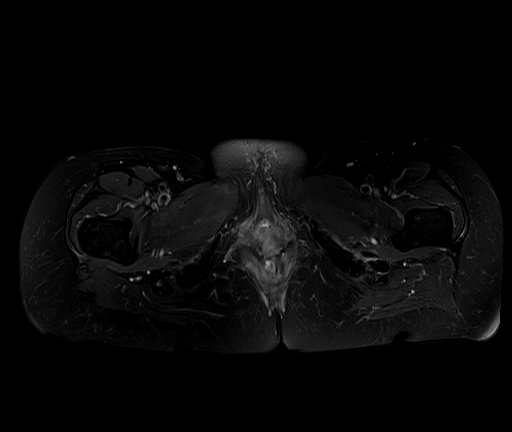
[im 17/30]
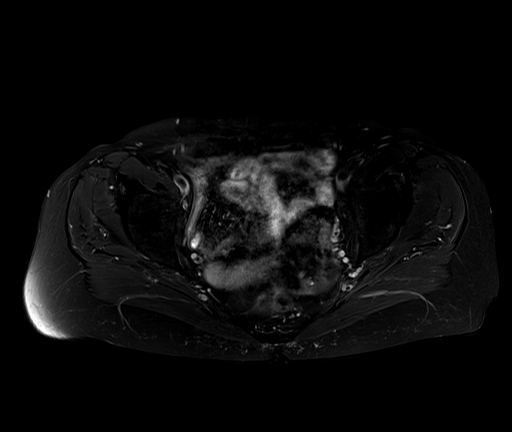
[im 25/30]
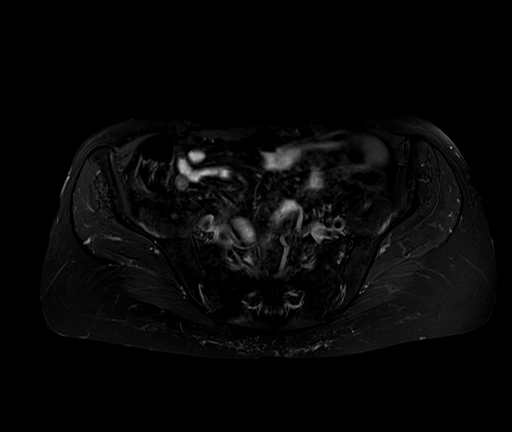

[26 of 48 positions shown; findings below may reference images not displayed]

FINDINGS: Urinary Tract: No focal urinary bladder wall thickening however the
urinary bladder is only mildly distended.

Bowel:  Mild to moderate sigmoid diverticulosis.

Vascular/Lymphatic: Mildly prominent blood vessels around the
uterus, as can be seen with pelvic venous congestion.

Reproductive: The uterus is atrophic. No gross adnexal abnormality.

Other: Moderate L5-S1 disc space narrowing, disc desiccation, and
marrow endplate edema degenerative change.

Musculoskeletal: Mild pubic symphysis joint space narrowing
osteoarthritis.

Mild bilateral femoroacetabular cartilage thinning.

No acute fracture, avascular necrosis, or destructive bone lesion.
IMPRESSION: :
IMPRESSION: 1. Mild pubic symphysis osteoarthritis.
2. Mild bilateral femoroacetabular cartilage thinning.

## 2023-01-27 DIAGNOSIS — M545 Low back pain, unspecified: Secondary | ICD-10-CM | POA: Diagnosis not present

## 2023-01-27 DIAGNOSIS — M5416 Radiculopathy, lumbar region: Secondary | ICD-10-CM | POA: Diagnosis not present

## 2023-02-02 DIAGNOSIS — M5416 Radiculopathy, lumbar region: Secondary | ICD-10-CM | POA: Diagnosis not present

## 2023-02-02 DIAGNOSIS — M545 Low back pain, unspecified: Secondary | ICD-10-CM | POA: Diagnosis not present

## 2023-02-09 ENCOUNTER — Other Ambulatory Visit (HOSPITAL_COMMUNITY): Payer: Self-pay

## 2023-02-09 DIAGNOSIS — M545 Low back pain, unspecified: Secondary | ICD-10-CM | POA: Diagnosis not present

## 2023-02-09 DIAGNOSIS — M5416 Radiculopathy, lumbar region: Secondary | ICD-10-CM | POA: Diagnosis not present

## 2023-02-13 ENCOUNTER — Other Ambulatory Visit: Payer: Self-pay

## 2023-02-13 ENCOUNTER — Other Ambulatory Visit (HOSPITAL_COMMUNITY): Payer: Self-pay

## 2023-02-15 ENCOUNTER — Other Ambulatory Visit: Payer: Self-pay

## 2023-02-20 ENCOUNTER — Other Ambulatory Visit: Payer: Self-pay | Admitting: Obstetrics & Gynecology

## 2023-02-22 DIAGNOSIS — G5701 Lesion of sciatic nerve, right lower limb: Secondary | ICD-10-CM | POA: Diagnosis not present

## 2023-02-22 DIAGNOSIS — R413 Other amnesia: Secondary | ICD-10-CM | POA: Diagnosis not present

## 2023-02-23 ENCOUNTER — Other Ambulatory Visit (HOSPITAL_COMMUNITY): Payer: Self-pay

## 2023-02-23 DIAGNOSIS — M5416 Radiculopathy, lumbar region: Secondary | ICD-10-CM | POA: Diagnosis not present

## 2023-02-23 MED ORDER — PREDNISONE 5 MG (48) PO TBPK
ORAL_TABLET | ORAL | 0 refills | Status: DC
  Filled 2023-02-23: qty 48, 12d supply, fill #0

## 2023-02-24 ENCOUNTER — Other Ambulatory Visit: Payer: Self-pay

## 2023-02-24 ENCOUNTER — Other Ambulatory Visit: Payer: Self-pay | Admitting: Obstetrics & Gynecology

## 2023-02-27 ENCOUNTER — Other Ambulatory Visit: Payer: Self-pay | Admitting: Obstetrics & Gynecology

## 2023-02-27 ENCOUNTER — Other Ambulatory Visit (HOSPITAL_COMMUNITY): Payer: Self-pay

## 2023-03-01 ENCOUNTER — Telehealth: Payer: Self-pay | Admitting: Orthopedic Surgery

## 2023-03-01 ENCOUNTER — Other Ambulatory Visit (HOSPITAL_COMMUNITY): Payer: Self-pay

## 2023-03-01 MED ORDER — TRAMADOL HCL 50 MG PO TABS
50.0000 mg | ORAL_TABLET | Freq: Two times a day (BID) | ORAL | 0 refills | Status: DC | PRN
  Filled 2023-03-01: qty 30, 15d supply, fill #0

## 2023-03-01 NOTE — Telephone Encounter (Signed)
Hx right sided lumbar radiculopathy pain. Last tramadol refill on 10/03/22 #30

## 2023-03-01 NOTE — Telephone Encounter (Signed)
Patient called. She would like Tramadol called in to the [facility]. Her call back number is [phone]

## 2023-03-03 DIAGNOSIS — M5416 Radiculopathy, lumbar region: Secondary | ICD-10-CM | POA: Diagnosis not present

## 2023-03-06 ENCOUNTER — Other Ambulatory Visit (HOSPITAL_COMMUNITY): Payer: Self-pay

## 2023-03-06 ENCOUNTER — Other Ambulatory Visit: Payer: Self-pay | Admitting: Orthopedic Surgery

## 2023-03-06 DIAGNOSIS — M545 Low back pain, unspecified: Secondary | ICD-10-CM

## 2023-03-06 MED ORDER — IBUPROFEN 600 MG PO TABS
600.0000 mg | ORAL_TABLET | ORAL | 1 refills | Status: DC | PRN
  Filled 2023-03-06: qty 30, 10d supply, fill #0
  Filled 2023-05-11: qty 30, 10d supply, fill #1

## 2023-03-08 ENCOUNTER — Other Ambulatory Visit (HOSPITAL_COMMUNITY): Payer: Self-pay

## 2023-03-08 DIAGNOSIS — J449 Chronic obstructive pulmonary disease, unspecified: Secondary | ICD-10-CM | POA: Diagnosis not present

## 2023-03-08 DIAGNOSIS — I700 Atherosclerosis of aorta: Secondary | ICD-10-CM | POA: Diagnosis not present

## 2023-03-08 DIAGNOSIS — I10 Essential (primary) hypertension: Secondary | ICD-10-CM | POA: Diagnosis not present

## 2023-03-08 DIAGNOSIS — I480 Paroxysmal atrial fibrillation: Secondary | ICD-10-CM | POA: Diagnosis not present

## 2023-03-08 DIAGNOSIS — G5701 Lesion of sciatic nerve, right lower limb: Secondary | ICD-10-CM | POA: Diagnosis not present

## 2023-03-08 MED ORDER — OLMESARTAN MEDOXOMIL 20 MG PO TABS
10.0000 mg | ORAL_TABLET | Freq: Every day | ORAL | 1 refills | Status: DC
  Filled 2023-03-08: qty 30, 60d supply, fill #0

## 2023-03-11 ENCOUNTER — Ambulatory Visit
Admission: RE | Admit: 2023-03-11 | Discharge: 2023-03-11 | Disposition: A | Discharge status: Home / Self Care | Payer: Medicare Other | Source: Ambulatory Visit | Attending: Orthopedic Surgery | Admitting: Orthopedic Surgery

## 2023-03-11 DIAGNOSIS — M545 Low back pain, unspecified: Secondary | ICD-10-CM

## 2023-03-14 ENCOUNTER — Other Ambulatory Visit (HOSPITAL_BASED_OUTPATIENT_CLINIC_OR_DEPARTMENT_OTHER): Payer: Self-pay | Admitting: Obstetrics & Gynecology

## 2023-03-14 ENCOUNTER — Other Ambulatory Visit (HOSPITAL_COMMUNITY): Payer: Self-pay

## 2023-03-14 DIAGNOSIS — F419 Anxiety disorder, unspecified: Secondary | ICD-10-CM

## 2023-03-16 ENCOUNTER — Other Ambulatory Visit (HOSPITAL_BASED_OUTPATIENT_CLINIC_OR_DEPARTMENT_OTHER): Payer: Self-pay | Admitting: Obstetrics & Gynecology

## 2023-03-16 ENCOUNTER — Other Ambulatory Visit (HOSPITAL_COMMUNITY): Payer: Self-pay

## 2023-03-16 DIAGNOSIS — F419 Anxiety disorder, unspecified: Secondary | ICD-10-CM

## 2023-03-16 MED ORDER — LORAZEPAM 1 MG PO TABS
0.5000 mg | ORAL_TABLET | Freq: Every day | ORAL | 0 refills | Status: DC | PRN
  Filled 2023-03-16 (×2): qty 30, 30d supply, fill #0

## 2023-03-17 DIAGNOSIS — M5416 Radiculopathy, lumbar region: Secondary | ICD-10-CM | POA: Diagnosis not present

## 2023-03-21 ENCOUNTER — Encounter (HOSPITAL_BASED_OUTPATIENT_CLINIC_OR_DEPARTMENT_OTHER): Payer: Self-pay

## 2023-03-21 ENCOUNTER — Emergency Department (HOSPITAL_BASED_OUTPATIENT_CLINIC_OR_DEPARTMENT_OTHER): Payer: Medicare Other | Admitting: Radiology

## 2023-03-21 ENCOUNTER — Emergency Department (HOSPITAL_BASED_OUTPATIENT_CLINIC_OR_DEPARTMENT_OTHER)
Admission: EM | Admit: 2023-03-21 | Discharge: 2023-03-21 | Disposition: A | Discharge status: Home / Self Care | Payer: Medicare Other | Attending: Emergency Medicine | Admitting: Emergency Medicine

## 2023-03-21 ENCOUNTER — Other Ambulatory Visit: Payer: Self-pay

## 2023-03-21 DIAGNOSIS — R1013 Epigastric pain: Secondary | ICD-10-CM | POA: Diagnosis not present

## 2023-03-21 DIAGNOSIS — R079 Chest pain, unspecified: Secondary | ICD-10-CM | POA: Insufficient documentation

## 2023-03-21 DIAGNOSIS — E871 Hypo-osmolality and hyponatremia: Secondary | ICD-10-CM | POA: Diagnosis not present

## 2023-03-21 DIAGNOSIS — Z7982 Long term (current) use of aspirin: Secondary | ICD-10-CM | POA: Insufficient documentation

## 2023-03-21 DIAGNOSIS — R748 Abnormal levels of other serum enzymes: Secondary | ICD-10-CM

## 2023-03-21 DIAGNOSIS — I480 Paroxysmal atrial fibrillation: Secondary | ICD-10-CM | POA: Diagnosis not present

## 2023-03-21 DIAGNOSIS — J439 Emphysema, unspecified: Secondary | ICD-10-CM | POA: Diagnosis not present

## 2023-03-21 DIAGNOSIS — I4891 Unspecified atrial fibrillation: Secondary | ICD-10-CM | POA: Diagnosis not present

## 2023-03-21 DIAGNOSIS — D75839 Thrombocytosis, unspecified: Secondary | ICD-10-CM

## 2023-03-21 DIAGNOSIS — R109 Unspecified abdominal pain: Secondary | ICD-10-CM | POA: Diagnosis present

## 2023-03-21 DIAGNOSIS — I10 Essential (primary) hypertension: Secondary | ICD-10-CM | POA: Diagnosis not present

## 2023-03-21 DIAGNOSIS — J449 Chronic obstructive pulmonary disease, unspecified: Secondary | ICD-10-CM | POA: Diagnosis not present

## 2023-03-21 DIAGNOSIS — R0789 Other chest pain: Secondary | ICD-10-CM | POA: Diagnosis not present

## 2023-03-21 DIAGNOSIS — R030 Elevated blood-pressure reading, without diagnosis of hypertension: Secondary | ICD-10-CM

## 2023-03-21 LAB — CBC
HCT: 38.6 % (ref 36.0–46.0)
Hemoglobin: 12.3 g/dL (ref 12.0–15.0)
MCH: 26.9 pg (ref 26.0–34.0)
MCHC: 31.9 g/dL (ref 30.0–36.0)
MCV: 84.3 fL (ref 80.0–100.0)
Platelets: 403 10*3/uL — ABNORMAL HIGH (ref 150–400)
RBC: 4.58 MIL/uL (ref 3.87–5.11)
RDW: 13.8 % (ref 11.5–15.5)
WBC: 8.3 10*3/uL (ref 4.0–10.5)
nRBC: 0 % (ref 0.0–0.2)

## 2023-03-21 LAB — COMPREHENSIVE METABOLIC PANEL
ALT: 8 U/L (ref 0–44)
AST: 16 U/L (ref 15–41)
Albumin: 4.3 g/dL (ref 3.5–5.0)
Alkaline Phosphatase: 44 U/L (ref 38–126)
Anion gap: 10 (ref 5–15)
BUN: 9 mg/dL (ref 8–23)
CO2: 24 mmol/L (ref 22–32)
Calcium: 9.2 mg/dL (ref 8.9–10.3)
Chloride: 99 mmol/L (ref 98–111)
Creatinine, Ser: 0.89 mg/dL (ref 0.44–1.00)
GFR, Estimated: >60 mL/min (ref >60)
Glucose, Bld: 99 mg/dL (ref 70–99)
Potassium: 3.8 mmol/L (ref 3.5–5.1)
Sodium: 133 mmol/L — ABNORMAL LOW (ref 135–145)
Total Bilirubin: 0.4 mg/dL (ref 0.3–1.2)
Total Protein: 7 g/dL (ref 6.5–8.1)

## 2023-03-21 LAB — TROPONIN I (HIGH SENSITIVITY): Troponin I (High Sensitivity): 5 ng/L (ref <18)

## 2023-03-21 LAB — LIPASE, BLOOD: Lipase: 59 U/L — ABNORMAL HIGH (ref 11–51)

## 2023-03-21 NOTE — ED Triage Notes (Signed)
Patient here POV from Home.  Endorses Epigastric ABD Pain that began 6 Weeks ago.   Sent by PCP for Assessment. No N/V/D. No Fevers.  NAD Noted during Triage. A&Ox4. GCS 15. Ambulatory.

## 2023-03-21 NOTE — ED Provider Notes (Signed)
[place] EMERGENCY DEPARTMENT AT [facility] Provider Note   CSN: [id number] Arrival date & time: 03/21/23  1918     History  Chief Complaint  Patient presents with   Abdominal Pain    Julie Mann is a 77 y.o. female.  The history is provided by the patient.  Abdominal Pain She has history of hyperlipidemia, COPD, anal cancer, paroxysmal atrial fibrillation but refusing anticoagulation and had an episode this morning where she felt like her heart was racing and she had a tight feeling in her chest.  This lasted about 30 minutes before resolving.  There is no associated dyspnea, nausea, diaphoresis.  She has such episodes on a somewhat regular basis, but decided she should be evaluated being after this episode.  She is a former smoker, denies history of hypertension or diabetes.   Home Medications Prior to Admission medications   Medication Sig Start Date End Date Taking? Authorizing Provider  APPLE CIDER VINEGAR PO Take 5 mLs by mouth daily as needed (Constipation).    [provider]  aspirin EC 81 MG tablet Take 1 tablet (81 mg total) by mouth daily. 08/03/17   Lyn Records, MD  atorvastatin (LIPITOR) 10 MG tablet Take 1 tablet (10 mg total) by mouth 2 (two) times a week on monday and friday 01/02/23     HYDROcodone-acetaminophen (NORCO) 7.5-325 MG tablet Take 1 tablet by mouth every 8 (eight) hours as needed for 10 days 06/03/22     ibuprofen (ADVIL) 600 MG tablet Take 1 tablet (600 mg total) by mouth as needed. 03/06/23     influenza vaccine adjuvanted (FLUAD QUADRIVALENT) 0.5 ML injection Inject 0.5 mLs into the muscle. 08/09/22   Judyann Munson, MD  LORazepam (ATIVAN) 1 MG tablet Take 1 tablet (1 mg total) by mouth at bedtime as needed for anxiety or sleep. 12/08/22   Jerene Bears, MD  LORazepam (ATIVAN) 1 MG tablet Take 1/2-1 tablet (0.5-1 mg total) by mouth daily as needed. 03/16/23     metoprolol tartrate (LOPRESSOR) 25 MG tablet Take 25 mg by mouth daily  as needed (palpitations/AFIB).  02/14/18   [provider]  Multiple Vitamins-Minerals (MULTIVITAMIN PO) Take 1 tablet by mouth daily. One a day    [provider]  olmesartan (BENICAR) 20 MG tablet Take 0.5 tablets (10 mg total) by mouth daily. 03/08/23     OVER THE COUNTER MEDICATION Take 5 mLs by mouth daily as needed (Constipation). Olive oil    [provider]  predniSONE (STERAPRED UNI-PAK 48 TAB) 5 MG (48) TBPK tablet Take as directed per package instructions. 02/23/23     Propylene Glycol-Glycerin 0.6-0.6 % SOLN Place 1 drop into both eyes 2 (two) times daily. Soothe Dry eyes    [provider]  traMADol (ULTRAM) 50 MG tablet Take 1 tablet (50 mg total) by mouth every 12 (twelve) hours as needed. 03/01/23   Adonis Huguenin, NP  triamcinolone cream (KENALOG) 0.1 % Apply topically to affected areas up to twice daily as needed. Do not apply to face, groin, or underarm 12/13/21     valACYclovir (VALTREX) 1000 MG tablet Take 2 tablets (2,000 mg total) by mouth at onset of symptoms; repeat dose in 12 hours as directed. Take with a full galss of water 01/03/22     DULoxetine (CYMBALTA) 30 MG capsule Take 1 capsule (30 mg total) by mouth daily. 08/29/22 11/16/22        Allergies    Duloxetine, Eliquis [apixaban],  Ivp dye [iodinated contrast media], Oxycodone, Tiotropium bromide monohydrate, Conjugated estrogens, Doxycycline, Erythromycin, Flonase [fluticasone propionate], Hydrocodone, Keflex [cephalexin], Other, Paxil [paroxetine hcl], Prednisone, Provera [medroxyprogesterone acetate], Xarelto [rivaroxaban], Zithromax [azithromycin], and Zoloft [sertraline hcl]    Review of Systems   Review of Systems  Gastrointestinal:  Positive for abdominal pain.  All other systems reviewed and are negative.   Physical Exam Updated Vital Signs BP (!) 190/68   Pulse 70   Temp 97.8 F (36.6 C) (Oral)   Resp 16   Ht 5\' 7"  (1.702 m)   Wt 52.2 kg   LMP 11/14/1992   SpO2 100%    BMI 18.01 kg/m  Physical Exam Vitals and nursing note reviewed.   77 year old female, resting comfortably and in no acute distress. Vital signs are significant for elevated blood pressure. Oxygen saturation is 100%, which is normal. Head is normocephalic and atraumatic. PERRLA, EOMI. Oropharynx is clear. Neck is nontender and supple without adenopathy or JVD. Back is nontender and there is no CVA tenderness. Lungs are clear without rales, wheezes, or rhonchi. Chest is nontender. Heart has regular rate and rhythm without murmur. Abdomen is soft, flat, nontender. Extremities have no cyanosis or edema, full range of motion is present. Skin is warm and dry without rash. Neurologic: Mental status is normal, cranial nerves are intact, moves all extremities equally.  ED Results / Procedures / Treatments   Labs (all labs ordered are listed, but only abnormal results are displayed) Labs Reviewed  LIPASE, BLOOD - Abnormal; Notable for the following components:      Result Value   Lipase 59 (*)    All other components within normal limits  COMPREHENSIVE METABOLIC PANEL - Abnormal; Notable for the following components:   Sodium 133 (*)    All other components within normal limits  CBC - Abnormal; Notable for the following components:   Platelets 403 (*)    All other components within normal limits  TROPONIN I (HIGH SENSITIVITY)  TROPONIN I (HIGH SENSITIVITY)    EKG EKG Interpretation  Date/Time:  Tuesday Mar 21 2023 19:27:33 EDT Ventricular Rate:  70 PR Interval:  182 QRS Duration: 84 QT Interval:  380 QTC Calculation: 410 R Axis:   85 Text Interpretation: Normal sinus rhythm Normal ECG When compared with ECG of 05-Feb-2018 17:46, Sinus rhythm has replaced Atrial fibrillation Reconfirmed by Dione Booze ([id number]) on 03/21/2023 11:25:55 PM  Radiology DG Chest 2 View  Result Date: 03/21/2023 CLINICAL DATA:  Epigastric pain. EXAM: CHEST - 2 VIEW COMPARISON:  Chest radiograph dated  08/25/2020. FINDINGS: Background of emphysema. Multiple surgical clips in the right upper lobe. No focal consolidation, pleural effusion, or pneumothorax. The cardiac silhouette is within normal limits. Atherosclerotic calcification of the aorta. No acute osseous pathology. IMPRESSION: 1. No active cardiopulmonary disease. 2. Emphysema. Electronically Signed   By: Elgie Collard M.D.   On: 03/21/2023 20:11    Procedures Procedures  Cardiac monitor shows normal sinus rhythm, per my interpretation.  Medications Ordered in ED Medications - No data to display  ED Course/ Medical Decision Making/ A&P                             Medical Decision Making Amount and/or Complexity of Data Reviewed Radiology: ordered.   Episode of chest discomfort which occurred when she also noted palpitations.  I strongly suspect that she was having atrial fibrillation with rapid ventricular response and the chest discomfort was  secondary to the arrhythmia.  I reviewed and interpreted her electrocardiogram and my interpretation is normal ECG.  However, previous ECG on 02/05/2018 had atrial fibrillation with rapid ventricular response.  Chest x-ray shows emphysema but no acute cardiopulmonary process.  Have independently viewed the images, and agree with radiologist interpretation.  I have reviewed and interpreted her laboratory tests and my interpretation is borderline hyponatremia which is not felt to be clinically significant, minimal elevation of lipase which is also not felt to be clinically significant, minimal thrombocytosis which is also not felt to be clinically significant.  Since it had been approximately 10 hours since symptoms resolved when her troponin was drawn, I do not feel she needs to be kept in the emergency department for repeat troponin.  I have reviewed her past records, and cardiology office visit on 08/30/2022 confirms history of paroxysmal atrial fibrillation with CHA2DS2-VASc score of 3 with  patient refusing anticoagulation.  I am referring her back to her cardiologist to consider outpatient cardiac monitoring to see how much time she is spending in atrial fibrillation.  I have also discussed with the patient that she is at risk for stroke because of atrial fibrillation and not being anticoagulated.  This is another discussion that we will need to be held with her cardiologist.  I have advised her to return if she has an episode that does not stop on its own.  Final Clinical Impression(s) / ED Diagnoses Final diagnoses:  Nonspecific chest pain  Paroxysmal atrial fibrillation (HCC)  Elevated blood pressure reading without diagnosis of hypertension  Hyponatremia  Elevated lipase  Thrombocytosis    Rx / DC Orders ED Discharge Orders     None         Dione Booze, MD 03/21/23 2339

## 2023-03-21 NOTE — ED Notes (Signed)
Upon bringing pt back to room she stated "I went into Afib and took a metoprolol". Pt does not know the dosage of the and did not have the medication bottle with her. EDP has been notified.

## 2023-03-21 NOTE — Discharge Instructions (Signed)
I am concerned that you are probably going in and out of atrial fibrillation which puts you at significant risk for having a stroke.  Please follow-up with the cardiologist to assess how often you are going into atrial fibrillation and to consider additional evaluation because of the episode of chest tightness.  Your blood pressure was high tonight.  You stated that it is usually normal at home.  Please continue to monitor your blood pressure at home.  If it is staying persistently elevated, you will need to be on medication to keep it in the normal range.  Uncontrolled high blood pressure can lead to heart attacks, strokes, kidney failure.

## 2023-03-22 ENCOUNTER — Telehealth: Payer: Self-pay | Admitting: Cardiology

## 2023-03-22 DIAGNOSIS — R079 Chest pain, unspecified: Secondary | ICD-10-CM | POA: Diagnosis not present

## 2023-03-22 NOTE — Telephone Encounter (Signed)
Pt c/o medication issue:  1. Name of Medication: metoprolol tartrate (LOPRESSOR) 25 MG tablet   2. How are you currently taking this medication (dosage and times per day)? Take 25 mg by mouth daily as needed   3. Are you having a reaction (difficulty breathing--STAT)? No  4. What is your medication issue? Patient is calling because she stated that her heart goes up and down, typically this medication will help. Patient saw her PCP yesterday and was told she needed to speak with her cardiologist office.

## 2023-03-22 NOTE — Telephone Encounter (Signed)
Pt is a former Dr. Katrinka Blazing pt and will be establishing to see Dr. Shari Prows for her yearly OV appt on 08/29/23.  Pt is calling into the office to schedule an appointment for sometime soon, for post-ER visit from yesterday, for complaints of palpitations/heart racing/chest tightness.  See ER note from yesterday in epic.  Pt states she does have history of PAF, but her last episode was so long ago and was very brief and not prolonged.  Pt last saw Dr. Katrinka Blazing back in Oct 2023 where he recommended anticoagulation and she refused.  He did provide her with a metoprolol script to take as needed for breakthrough palps/afib, which she states she hasn't had to resort in using, until yesterday.   Pt states that yesterday she started having heart palpitations/fluttering and felt her heart racing with chest tightness.  She states it lasted about 30 mins before resolving.  She said it mostly resolved with use of her prn metoprolol which prescribed by Dr. Katrinka Blazing for her to take  as needed for palpitations/breakthrough afib.  She said she had no sob, doe, diaphoresis, N/V, pre-syncopal or syncopal episode during this event.   Per the ER MD note from yesterday, he suspected that the pt may have had afib with rapid ventricular response and the chest tightness was secondary to the arrhythmia.  Afib did not show up on ER EKG (showed NSR), but pt had already taken her PRN metoprolol prior to going into the ER.  ER Provider discharged her last night and advised her to follow-up with Cards today, to schedule an appointment for this, where an outpatient heart monitor should probably be ordered.  Also ER MD advised for her to follow-up with Cards, to discuss again with the pt that she is at high risk for stroke being she is not anticoagulated.    Given the pts recent ER visit, possible afib and not being anti-coagulated, scheduled her an appointment with our DOD Dr. Clifton James for this Friday 5/10 at 4:30 pm.  Pt is aware to arrive 15  mins prior to this appt.   Advised the pt to continue her current med regimen and PRN metoprolol for palpitations/afib.  ED precautions provided to the pt if symptoms return, worsen, or persist between now and her appt this Friday.   Pt is aware that I will share this plan with her new Cardiologist, Dr. Shari Prows.   Pt verbalized understanding and agrees with this plan.

## 2023-03-23 DIAGNOSIS — K5900 Constipation, unspecified: Secondary | ICD-10-CM | POA: Diagnosis not present

## 2023-03-23 DIAGNOSIS — R198 Other specified symptoms and signs involving the digestive system and abdomen: Secondary | ICD-10-CM | POA: Diagnosis not present

## 2023-03-24 ENCOUNTER — Other Ambulatory Visit (HOSPITAL_COMMUNITY): Payer: Self-pay

## 2023-03-24 ENCOUNTER — Ambulatory Visit: Payer: Medicare Other | Attending: Cardiovascular Disease | Admitting: Cardiovascular Disease

## 2023-03-24 ENCOUNTER — Encounter: Payer: Self-pay | Admitting: Cardiovascular Disease

## 2023-03-24 VITALS — BP 150/66 | HR 88 | Ht 67.0 in | Wt 117.8 lb

## 2023-03-24 DIAGNOSIS — I480 Paroxysmal atrial fibrillation: Secondary | ICD-10-CM

## 2023-03-24 MED ORDER — METOPROLOL SUCCINATE ER 25 MG PO TB24
25.0000 mg | ORAL_TABLET | Freq: Every day | ORAL | 3 refills | Status: DC
  Filled 2023-03-24: qty 90, 90d supply, fill #0

## 2023-03-24 NOTE — Patient Instructions (Signed)
Medication Instructions:  Your physician has recommended you make the following change in your medication:  1.) metoprolol succinate (Toprol XL) 25 mg - take one tablet daily   *If you need a refill on your cardiac medications before your next appointment, please call your pharmacy*   Lab Work: No changes   Testing/Procedures: None   Follow-Up: As planned with Dr. Shari Prows

## 2023-03-24 NOTE — Progress Notes (Signed)
Chief Complaint  Patient presents with   Follow-up    PAF   History of Present Illness: 77 yo female with history of anxiety, anal cancer treated with chemotherapy and radiation, arthritis, paroxysmal atrial fibrillation, former tobacco abuse, COPD and hyperlipidemia who is here today for cardiac follow up. She has been followed in the past by Dr. Katrinka Blazing. She is scheduled to see Dr. Shari Prows to establish care in October 2024. She is added onto my DOD schedule today to discuss her recent palpitations. She has not been on anti-coagulation secondary to refusal in the past. She has been on ASA. Echo in 2018 with LVEF=65-70%. No significant valve disease. She was seen in the [facility] ED on 03/21/23 with c/o palpitations/tachycardia and chest pain. When her heart rate came down at home her chest pain resolved. In the ED, troponin negative. EKG with sinus rhythm. She notes more frequent palpitations over the past month which she thinks is due to her severe right leg pain from sciatica and bursitis. She feels well overall. She has no dyspnea, lower extremity edema, orthopnea, PND, dizziness, near syncope or syncope. No exertional chest pain.   Primary Care Physician: Merlene Laughter, MD   Past Medical History:  Diagnosis Date   Anal cancer (HCC) 2005   SCCa anus-stage II chemo, radiation   Anxiety    Arthritis    "hands, fingers, back" (02/27/2014)   Atrial fibrillation (HCC)    Cat scratch of right hand 02/24/2014   COPD (chronic obstructive pulmonary disease) (HCC)    Dyspnea    walking up hill;.  stairs (If Iam tired)   Dysrhythmia    PAF   Fall from horse 01/2018   Fracture, ankle 02/2019   Right    High cholesterol    History of stomach ulcers ~ 1966   Osteopenia     Past Surgical History:  Procedure Laterality Date   ANUS SURGERY  2005   "biopsy"   BREAST CYST EXCISION Right 1966   DILATION AND CURETTAGE OF UTERUS  1980's   S/P miscarriage   EXTERNAL FIXATION LEG Right  03/10/2019   Procedure: OPEN REDUCTION INTERNAL FIXATION RIGHT ANKLE;  Surgeon: Teryl Lucy, MD;  Location: [facility] OR;  Service: Orthopedics;  Laterality: Right;   HARDWARE REMOVAL Right 02/05/2021   Procedure: REMOVAL OF HARDWARE RIGHT ANKLE;  Surgeon: Nadara Mustard, MD;  Location: [facility] OR;  Service: Orthopedics;  Laterality: Right;   VARICOSE VEIN SURGERY Left    left leg   VIDEO BRONCHOSCOPY WITH ENDOBRONCHIAL NAVIGATION Right 08/25/2020   Procedure: VIDEO BRONCHOSCOPY WITH ENDOBRONCHIAL NAVIGATION WITH FIDUCIAL PLACEMENT;  Surgeon: Josephine Igo, DO;  Location: [facility] OR;  Service: Pulmonary;  Laterality: Right;    Current Outpatient Medications  Medication Sig Dispense Refill   APPLE CIDER VINEGAR PO Take 5 mLs by mouth daily as needed (Constipation).     aspirin EC 81 MG tablet Take 1 tablet (81 mg total) by mouth daily. 90 tablet 3   atorvastatin (LIPITOR) 10 MG tablet Take 1 tablet (10 mg total) by mouth 2 (two) times a week on monday and friday 26 tablet 3   HYDROcodone-acetaminophen (NORCO) 7.5-325 MG tablet Take 1 tablet by mouth every 8 (eight) hours as needed for 10 days 30 tablet 0   ibuprofen (ADVIL) 600 MG tablet Take 1 tablet (600 mg total) by mouth as needed. 30 tablet 1   influenza vaccine adjuvanted (FLUAD QUADRIVALENT) 0.5 ML injection Inject 0.5 mLs into the muscle. 0.5 mL  0   LORazepam (ATIVAN) 1 MG tablet Take 1 tablet (1 mg total) by mouth at bedtime as needed for anxiety or sleep. 30 tablet 2   LORazepam (ATIVAN) 1 MG tablet Take 1/2-1 tablet (0.5-1 mg total) by mouth daily as needed. 30 tablet 0   metoprolol succinate (TOPROL XL) 25 MG 24 hr tablet Take 1 tablet (25 mg total) by mouth daily. 90 tablet 3   metoprolol tartrate (LOPRESSOR) 25 MG tablet Take 25 mg by mouth daily as needed (palpitations/AFIB).      Multiple Vitamins-Minerals (MULTIVITAMIN PO) Take 1 tablet by mouth daily. One a day     OVER THE COUNTER MEDICATION Take 5 mLs by mouth daily as needed  (Constipation). Olive oil     Propylene Glycol-Glycerin 0.6-0.6 % SOLN Place 1 drop into both eyes 2 (two) times daily. Soothe Dry eyes     traMADol (ULTRAM) 50 MG tablet Take 1 tablet (50 mg total) by mouth every 12 (twelve) hours as needed. 30 tablet 0   triamcinolone cream (KENALOG) 0.1 % Apply topically to affected areas up to twice daily as needed. Do not apply to face, groin, or underarm 454 g 3   valACYclovir (VALTREX) 1000 MG tablet Take 2 tablets (2,000 mg total) by mouth at onset of symptoms; repeat dose in 12 hours as directed. Take with a full galss of water 10 tablet 3   olmesartan (BENICAR) 20 MG tablet Take 0.5 tablets (10 mg total) by mouth daily. (Patient not taking: Reported on 03/24/2023) 30 tablet 1   No current facility-administered medications for this visit.    Allergies  Allergen Reactions   Duloxetine Other (See Comments)    Pt states put her in a coma   Eliquis [Apixaban] Anaphylaxis    Made her feel sick   Ivp Dye [Iodinated Contrast Media] Swelling    01/05/2022 small amount of contrast used in Transforaminal epidural without problem.   Oxycodone Other (See Comments)    Went into Afib.    Tiotropium Bromide Monohydrate Other (See Comments)    Headache, Blurry vision, chest heaviness.   Conjugated Estrogens     Felt weird Other reaction(s): hallucinations   Doxycycline Other (See Comments)    Dizziness and tingling   Erythromycin Other (See Comments)    Hallucinations    Flonase [Fluticasone Propionate]    Hydrocodone    Keflex [Cephalexin] Other (See Comments)    Hallucinations.   Other     Other reaction(s): hallucinations Other reaction(s): sedation Other reaction(s): hallucinations   Paxil [Paroxetine Hcl] Diarrhea   Prednisone     blurry eyes   Provera [Medroxyprogesterone Acetate]     Patient do not remember reaction   Xarelto [Rivaroxaban]     Made her feel sick   Zithromax [Azithromycin] Other (See Comments)    hallucinations   Zoloft  [Sertraline Hcl] Other (See Comments)    sedation    Social History   Socioeconomic History   Marital status: Married    Spouse name: Not on file   Number of children: 1   Years of education: Not on file   Highest education level: Not on file  Occupational History   Occupation: Violinist  Tobacco Use   Smoking status: Former    Packs/day: 1.00    Years: 30.00    Additional pack years: 0.00    Total pack years: 30.00    Types: Cigarettes    Quit date: 2005    Years since quitting: 47.3  Smokeless tobacco: Never  Vaping Use   Vaping Use: Never used  Substance and Sexual Activity   Alcohol use: No   Drug use: No   Sexual activity: Not Currently    Partners: Male    Birth control/protection: Post-menopausal  Other Topics Concern   Not on file  Social History Narrative   Not on file   Social Determinants of Health   Financial Resource Strain: Not on file  Food Insecurity: Not on file  Transportation Needs: Not on file  Physical Activity: Not on file  Stress: Not on file  Social Connections: Not on file  Intimate Partner Violence: Not on file    Family History  Problem Relation Age of Onset   Breast cancer Maternal Aunt    Breast cancer Maternal Grandmother    Heart disease Mother    Stroke Father    Rectal cancer Paternal Grandmother    Breast cancer Sister 15   Breast cancer Sister 34    Review of Systems:  As stated in the HPI and otherwise negative.   BP (!) 150/66   Pulse 88   Ht 5\' 7"  (1.702 m)   Wt 53.4 kg   LMP 11/14/1992   SpO2 98%   BMI 18.45 kg/m   Physical Examination: General: Well developed, well nourished, NAD  HEENT: OP clear, mucus membranes moist  SKIN: warm, dry. No rashes. Neuro: No focal deficits  Musculoskeletal: Muscle strength 5/5 all ext  Psychiatric: Mood and affect normal  Neck: No JVD, no carotid bruits, no thyromegaly, no lymphadenopathy.  Lungs:Clear bilaterally, no wheezes, rhonci, crackles Cardiovascular:  Regular rate and rhythm. No murmurs, gallops or rubs. Abdomen:Soft. Bowel sounds present. Non-tender.  Extremities: No lower extremity edema. Pulses are 2 + in the bilateral DP/PT.  EKG:  EKG is not ordered today. The ekg from 03/21/23 is reviewed today and shows Sinus  Recent Labs: 03/21/2023: ALT 8; BUN 9; Creatinine, Ser 0.89; Hemoglobin 12.3; Platelets 403; Potassium 3.8; Sodium 133   Lipid Panel No results found for: "CHOL", "TRIG", "HDL", "CHOLHDL", "VLDL", "LDLCALC", "LDLDIRECT"   Wt Readings from Last 3 Encounters:  03/24/23 53.4 kg  03/21/23 52.2 kg  12/08/22 54 kg    Assessment and Plan:   1. Paroxysmal atrial fibrillation: More frequent palpitations over the past month but they only last for a few minutes and resolve if she takes metoprolol tartrate which was given to her by Dr. Katrinka Blazing. Sinus rhythm on EKG 3 days ago. Sinus on exam today. She presented to the ED with c/o palpitations with associated chest pain. Likely atrial fib at home. We discussed arranging a cardiac monitor to assess atrial fib burden but she does not wish to do this today. CHADS VASC score of 35 (age, female). She has refused anti-coagulation in the past. She does not wish to consider anti-coagulation. She is willing to start Toprol 25 mg daily. I have told her that if she continues to have episodes of palpitations, we will need to consider a cardiac monitor and if there is frequent atrial fib, will need to really consider stroke risk reduction with long term anti-coagulation.   Labs/ tests ordered today include:  No orders of the defined types were placed in this encounter.  Disposition:   F/U as planned with Dr. Shari Prows in October 2024.    Signed, Verne Carrow, MD, [facility] 03/24/2023 3:19 PM    [facility] [address] [place], [place], [place]  [address] Phone: [phone]; Fax: [phone]    

## 2023-03-27 ENCOUNTER — Other Ambulatory Visit (HOSPITAL_COMMUNITY): Payer: Self-pay

## 2023-03-27 ENCOUNTER — Telehealth: Payer: Self-pay | Admitting: Cardiovascular Disease

## 2023-03-27 ENCOUNTER — Emergency Department (HOSPITAL_COMMUNITY): Payer: Medicare Other

## 2023-03-27 ENCOUNTER — Other Ambulatory Visit: Payer: Self-pay

## 2023-03-27 ENCOUNTER — Emergency Department (HOSPITAL_COMMUNITY)
Admission: EM | Admit: 2023-03-27 | Discharge: 2023-03-27 | Disposition: A | Discharge status: Home / Self Care | Payer: Medicare Other | Attending: Student | Admitting: Student

## 2023-03-27 ENCOUNTER — Encounter (HOSPITAL_COMMUNITY): Payer: Self-pay

## 2023-03-27 DIAGNOSIS — Z79899 Other long term (current) drug therapy: Secondary | ICD-10-CM | POA: Diagnosis not present

## 2023-03-27 DIAGNOSIS — H538 Other visual disturbances: Secondary | ICD-10-CM

## 2023-03-27 DIAGNOSIS — I10 Essential (primary) hypertension: Secondary | ICD-10-CM | POA: Diagnosis not present

## 2023-03-27 DIAGNOSIS — J449 Chronic obstructive pulmonary disease, unspecified: Secondary | ICD-10-CM | POA: Diagnosis not present

## 2023-03-27 DIAGNOSIS — Z85048 Personal history of other malignant neoplasm of rectum, rectosigmoid junction, and anus: Secondary | ICD-10-CM | POA: Diagnosis not present

## 2023-03-27 DIAGNOSIS — I4891 Unspecified atrial fibrillation: Secondary | ICD-10-CM | POA: Diagnosis not present

## 2023-03-27 DIAGNOSIS — Z7982 Long term (current) use of aspirin: Secondary | ICD-10-CM | POA: Insufficient documentation

## 2023-03-27 LAB — CBC
HCT: 38.2 % (ref 36.0–46.0)
Hemoglobin: 12.1 g/dL (ref 12.0–15.0)
MCH: 26.7 pg (ref 26.0–34.0)
MCHC: 31.7 g/dL (ref 30.0–36.0)
MCV: 84.3 fL (ref 80.0–100.0)
Platelets: 439 10*3/uL — ABNORMAL HIGH (ref 150–400)
RBC: 4.53 MIL/uL (ref 3.87–5.11)
RDW: 13.6 % (ref 11.5–15.5)
WBC: 8.4 10*3/uL (ref 4.0–10.5)
nRBC: 0 % (ref 0.0–0.2)

## 2023-03-27 LAB — BASIC METABOLIC PANEL
Anion gap: 10 (ref 5–15)
BUN: 10 mg/dL (ref 8–23)
CO2: 22 mmol/L (ref 22–32)
Calcium: 9.2 mg/dL (ref 8.9–10.3)
Chloride: 102 mmol/L (ref 98–111)
Creatinine, Ser: 0.92 mg/dL (ref 0.44–1.00)
GFR, Estimated: >60 mL/min (ref >60)
Glucose, Bld: 140 mg/dL — ABNORMAL HIGH (ref 70–99)
Potassium: 3.9 mmol/L (ref 3.5–5.1)
Sodium: 134 mmol/L — ABNORMAL LOW (ref 135–145)

## 2023-03-27 MED ORDER — TETRACAINE HCL 0.5 % OP SOLN
1.0000 [drp] | Freq: Once | OPHTHALMIC | Status: AC
  Administered 2023-03-27: 1 [drp] via OPHTHALMIC
  Filled 2023-03-27: qty 4

## 2023-03-27 NOTE — ED Provider Notes (Signed)
Julie Mann EMERGENCY DEPARTMENT AT [facility] Provider Note   CSN: [id number] Arrival date & time: 03/27/23  1443     History  No chief complaint on file.   Julie Mann is a 77 y.o. female.  HPI 77 year old female with history of atrial fibrillation on metoprolol, COPD, hyperlipidemia, prior anal cancer now in remission presenting for blurry vision.  Last week patient was seen in the ED for atrial fibrillation.  She followed up with Dr. Clifton James with cardiology on Friday.  She was started on metoprolol succinate once daily.  She took 1 dose Friday morning.  Since then she has had progressive bilateral blurry vision which is persistent.  No dizziness, lightheadedness, syncope, chest pain, difficulty breathing.  No headache, weakness, numbness.  Her vision changes are symmetric, no visual field cuts.  She is not had any eye trauma or pain.  She wears glasses but no contacts.  No other ocular problems.  She used to take metoprolol tartrate as needed, and tolerated that without difficulty.     Home Medications Prior to Admission medications   Medication Sig Start Date End Date Taking? Authorizing Provider  APPLE CIDER VINEGAR PO Take 5 mLs by mouth daily as needed (Constipation).    [provider]  aspirin EC 81 MG tablet Take 1 tablet (81 mg total) by mouth daily. 08/03/17   Lyn Records, MD  atorvastatin (LIPITOR) 10 MG tablet Take 1 tablet (10 mg total) by mouth 2 (two) times a week on monday and friday 01/02/23     HYDROcodone-acetaminophen (NORCO) 7.5-325 MG tablet Take 1 tablet by mouth every 8 (eight) hours as needed for 10 days 06/03/22     ibuprofen (ADVIL) 600 MG tablet Take 1 tablet (600 mg total) by mouth as needed. 03/06/23     influenza vaccine adjuvanted (FLUAD QUADRIVALENT) 0.5 ML injection Inject 0.5 mLs into the muscle. 08/09/22   Judyann Munson, MD  LORazepam (ATIVAN) 1 MG tablet Take 1 tablet (1 mg total) by mouth at bedtime as needed for anxiety or  sleep. 12/08/22   Jerene Bears, MD  LORazepam (ATIVAN) 1 MG tablet Take 1/2-1 tablet (0.5-1 mg total) by mouth daily as needed. 03/16/23     metoprolol succinate (TOPROL XL) 25 MG 24 hr tablet Take 1 tablet (25 mg total) by mouth daily. 03/24/23   Kathleene Hazel, MD  metoprolol tartrate (LOPRESSOR) 25 MG tablet Take 25 mg by mouth daily as needed (palpitations/AFIB).  02/14/18   [provider]  Multiple Vitamins-Minerals (MULTIVITAMIN PO) Take 1 tablet by mouth daily. One a day    [provider]  olmesartan (BENICAR) 20 MG tablet Take 0.5 tablets (10 mg total) by mouth daily. Patient not taking: Reported on 03/24/2023 03/08/23     OVER THE COUNTER MEDICATION Take 5 mLs by mouth daily as needed (Constipation). Olive oil    [provider]  Propylene Glycol-Glycerin 0.6-0.6 % SOLN Place 1 drop into both eyes 2 (two) times daily. Soothe Dry eyes    [provider]  traMADol (ULTRAM) 50 MG tablet Take 1 tablet (50 mg total) by mouth every 12 (twelve) hours as needed. 03/01/23   Adonis Huguenin, NP  triamcinolone cream (KENALOG) 0.1 % Apply topically to affected areas up to twice daily as needed. Do not apply to face, groin, or underarm 12/13/21     valACYclovir (VALTREX) 1000 MG tablet Take 2 tablets (2,000 mg total) by mouth at onset of symptoms; repeat dose  in 12 hours as directed. Take with a full galss of water 01/03/22     DULoxetine (CYMBALTA) 30 MG capsule Take 1 capsule (30 mg total) by mouth daily. 08/29/22 11/16/22        Allergies    Duloxetine, Eliquis [apixaban], Ivp dye [iodinated contrast media], Oxycodone, Tiotropium bromide monohydrate, Conjugated estrogens, Doxycycline, Erythromycin, Flonase [fluticasone propionate], Hydrocodone, Keflex [cephalexin], Other, Paxil [paroxetine hcl], Prednisone, Provera [medroxyprogesterone acetate], Xarelto [rivaroxaban], Zithromax [azithromycin], and Zoloft [sertraline hcl]    Review of Systems   Review of Systems   Eyes:  Positive for visual disturbance.  All other systems reviewed and are negative.   Physical Exam Updated Vital Signs BP (!) 170/75   Pulse 72   Temp 98 F (36.7 C) (Oral)   Resp 17   LMP 11/14/1992   SpO2 100%  Physical Exam Vitals and nursing note reviewed.  Constitutional:      General: She is not in acute distress.    Appearance: She is well-developed.  HENT:     Head: Normocephalic and atraumatic.     Nose: Nose normal.     Mouth/Throat:     Mouth: Mucous membranes are moist.     Pharynx: Oropharynx is clear.  Eyes:     General: Lids are normal. No visual field deficit.    Extraocular Movements: Extraocular movements intact.     Right eye: Normal extraocular motion.     Left eye: Normal extraocular motion.     Conjunctiva/sclera: Conjunctivae normal.     Right eye: Right conjunctiva is not injected. No chemosis or hemorrhage.    Left eye: Left conjunctiva is not injected. No chemosis or hemorrhage.    Pupils: Pupils are equal, round, and reactive to light.     Comments: 20/50 R eye, 20/30 L eye  Cardiovascular:     Rate and Rhythm: Normal rate and regular rhythm.     Heart sounds: No murmur heard. Pulmonary:     Effort: Pulmonary effort is normal. No respiratory distress.     Breath sounds: Normal breath sounds.  Abdominal:     Palpations: Abdomen is soft.     Tenderness: There is no abdominal tenderness.  Musculoskeletal:        General: No swelling.     Cervical back: Neck supple.  Skin:    General: Skin is warm and dry.     Capillary Refill: Capillary refill takes less than 2 seconds.  Neurological:     General: No focal deficit present.     Mental Status: She is alert and oriented to person, place, and time. Mental status is at baseline.     GCS: GCS eye subscore is 4. GCS verbal subscore is 5. GCS motor subscore is 6.     Cranial Nerves: No cranial nerve deficit, dysarthria or facial asymmetry.     Sensory: No sensory deficit.     Motor: Motor  function is intact. No weakness.     Coordination: Coordination is intact. Romberg sign negative. Coordination normal. Finger-Nose-Finger Test normal.     Gait: Gait is intact.     Deep Tendon Reflexes: Reflexes normal.  Psychiatric:        Mood and Affect: Mood normal.     ED Results / Procedures / Treatments   Labs (all labs ordered are listed, but only abnormal results are displayed) Labs Reviewed  BASIC METABOLIC PANEL - Abnormal; Notable for the following components:      Result Value   Sodium 134 (*)  Glucose, Bld 140 (*)    All other components within normal limits  CBC - Abnormal; Notable for the following components:   Platelets 439 (*)    All other components within normal limits    EKG None  Radiology CT Head Wo Contrast  Result Date: 03/27/2023 CLINICAL DATA:  Vision loss.  Binocular blurry vision. EXAM: CT HEAD WITHOUT CONTRAST TECHNIQUE: Contiguous axial images were obtained from the base of the skull through the vertex without intravenous contrast. RADIATION DOSE REDUCTION: This exam was performed according to the departmental dose-optimization program which includes automated exposure control, adjustment of the mA and/or kV according to patient size and/or use of iterative reconstruction technique. COMPARISON:  Head CT 02/05/2018. FINDINGS: Brain: No acute intracranial hemorrhage. Gray-white differentiation is preserved. No hydrocephalus or extra-axial collection. No mass effect or midline shift. Vascular: No hyperdense vessel or unexpected calcification. Skull: No calvarial fracture or suspicious bone lesion. Skull base is unremarkable. Sinuses/Orbits: Unremarkable. Other: None. IMPRESSION: No acute intracranial abnormality. Electronically Signed   By: Orvan Falconer M.D.   On: 03/27/2023 17:53    Procedures Procedures    Medications Ordered in ED Medications  tetracaine (PONTOCAINE) 0.5 % ophthalmic solution 1 drop (1 drop Both Eyes Given 03/27/23 1830)     ED Course/ Medical Decision Making/ A&P Clinical Course as of 03/27/23 1912  Mon Mar 27, 2023  1547 EKG normal sinus rhythm.  She has nonspecific T wave normalities in inferior lateral leads, which.  Relatively similar to prior from Mar 21, 2023. [JD]  1609 R eye 14, L eye 18 [JD]    Clinical Course User Index [JD] Fulton Reek, MD                             Medical Decision Making Amount and/or Complexity of Data Reviewed Labs: ordered. Radiology: ordered.  Risk Prescription drug management.   77 year old female presenting for blurry vision after restarting metoprolol.  Vital signs notable for mild hypertension.  She is normal neurologic exam, no focal deficits less consistent with CVA.  She has bilateral blurry vision all visual fields without visual field cuts.  Unclear if this is related to metoprolol, although onset was shortly after restarting this.  She tolerated Toprol tartrate before.  Will check eye pressures, her eye exam otherwise unremarkable other than  decreased visual acuity bilaterally.  Obtain basic lab work.  CBC, BMP are unremarkable.  Eye pressures evaluated, right eye pressure 14 left eye pressure of 18.  No signs of glaucoma.  Will obtain noncontrast CT head to rule out stroke.  CT scan reviewed, unremarkable.  Her vision changes persist.  Discussed patient with Dr. Elige Radon with ophthalmology.  They feel this is likely related to dryness exacerbated by metoprolol.  They will see her in the clinic at 815 tomorrow morning.  They are not concerned for stroke based on history.  Discussed with patient, will discharge home and have him follow-up with ophthalmology tomorrow.  Strict turn precautions given.  Discharged in stable condition.        Final Clinical Impression(s) / ED Diagnoses Final diagnoses:  Blurry vision    Rx / DC Orders ED Discharge Orders     None         Fulton Reek, MD 03/27/23 1912    Glendora Score, MD 03/28/23  [phone]

## 2023-03-27 NOTE — Telephone Encounter (Signed)
Pt c/o medication issue:  1. Name of Medication:   metoprolol succinate (TOPROL XL) 25 MG 24 hr tablet    2. How are you currently taking this medication (dosage and times per day)? Take 1 tablet (25 mg total) by mouth daily.   3. Are you having a reaction (difficulty breathing--STAT)? No  4. What is your medication issue?  Pt states that since starting medication on 5/11, she has been experiencing blurred vision. Pt would like a callback regarding this matter.

## 2023-03-27 NOTE — Telephone Encounter (Signed)
Spoke with the patient, explained Dr. Shari Prows recommendation: Advise pt to go to the nearest emergency room for further evaluation. At first the patient was reluctant  she did not want to go to the ED and wait for  hours to be seen  by MD. Explained to the patient her symptoms are concerning and need further evaluation. Patient voiced understanding.

## 2023-03-27 NOTE — Discharge Instructions (Signed)
Please pick up some artificial tears at your pharmacy and use them 4 times daily.  Please go straight to Dr. Ovidio Kin office at 815 tomorrow morning.

## 2023-03-27 NOTE — ED Triage Notes (Signed)
Pt recently started taking metoprolol succinate (Toprol XL) 25 mg on Saturday and since then she has had blurred vision in both eyes. Denies any other symptoms at this time.

## 2023-03-27 NOTE — Telephone Encounter (Signed)
On 5/10 pt was prescribed Metoprolol succinate 25 mg for increased palpitations. She took her first dose on 5/11 and has experienced blurry vision this is still an ongoing symptom. Pt did not contact PCP nor seek medical attention. 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 Will forward to MD and nurse for advise.

## 2023-03-28 ENCOUNTER — Ambulatory Visit: Payer: Medicare Other | Admitting: Nurse Practitioner

## 2023-03-28 DIAGNOSIS — H538 Other visual disturbances: Secondary | ICD-10-CM | POA: Diagnosis not present

## 2023-03-28 DIAGNOSIS — Z961 Presence of intraocular lens: Secondary | ICD-10-CM | POA: Diagnosis not present

## 2023-03-28 DIAGNOSIS — H35371 Puckering of macula, right eye: Secondary | ICD-10-CM | POA: Diagnosis not present

## 2023-03-28 DIAGNOSIS — H04123 Dry eye syndrome of bilateral lacrimal glands: Secondary | ICD-10-CM | POA: Diagnosis not present

## 2023-03-31 DIAGNOSIS — M7061 Trochanteric bursitis, right hip: Secondary | ICD-10-CM | POA: Diagnosis not present

## 2023-04-07 ENCOUNTER — Other Ambulatory Visit (HOSPITAL_COMMUNITY): Payer: Self-pay

## 2023-04-07 DIAGNOSIS — I480 Paroxysmal atrial fibrillation: Secondary | ICD-10-CM | POA: Diagnosis not present

## 2023-04-07 DIAGNOSIS — I10 Essential (primary) hypertension: Secondary | ICD-10-CM | POA: Diagnosis not present

## 2023-04-12 ENCOUNTER — Other Ambulatory Visit (HOSPITAL_COMMUNITY): Payer: Self-pay

## 2023-04-12 DIAGNOSIS — L0292 Furuncle, unspecified: Secondary | ICD-10-CM | POA: Diagnosis not present

## 2023-04-12 MED ORDER — AMOXICILLIN 875 MG PO TABS
875.0000 mg | ORAL_TABLET | Freq: Two times a day (BID) | ORAL | 0 refills | Status: DC
  Filled 2023-04-12: qty 20, 10d supply, fill #0

## 2023-04-12 MED ORDER — SULFAMETHOXAZOLE-TRIMETHOPRIM 800-160 MG PO TABS
1.0000 | ORAL_TABLET | Freq: Two times a day (BID) | ORAL | 0 refills | Status: DC
  Filled 2023-04-12: qty 20, 10d supply, fill #0

## 2023-04-16 ENCOUNTER — Other Ambulatory Visit (HOSPITAL_COMMUNITY): Payer: Self-pay

## 2023-04-17 ENCOUNTER — Other Ambulatory Visit (HOSPITAL_COMMUNITY): Payer: Self-pay

## 2023-04-17 DIAGNOSIS — M7061 Trochanteric bursitis, right hip: Secondary | ICD-10-CM | POA: Diagnosis not present

## 2023-04-17 MED ORDER — LORAZEPAM 1 MG PO TABS
0.5000 mg | ORAL_TABLET | Freq: Every day | ORAL | 0 refills | Status: DC | PRN
  Filled 2023-04-17: qty 30, 30d supply, fill #0

## 2023-04-17 MED ORDER — LEVOFLOXACIN 500 MG PO TABS
500.0000 mg | ORAL_TABLET | Freq: Every day | ORAL | 0 refills | Status: DC
  Filled 2023-04-17: qty 10, 10d supply, fill #0

## 2023-04-21 DIAGNOSIS — Z961 Presence of intraocular lens: Secondary | ICD-10-CM | POA: Diagnosis not present

## 2023-04-21 DIAGNOSIS — H04123 Dry eye syndrome of bilateral lacrimal glands: Secondary | ICD-10-CM | POA: Diagnosis not present

## 2023-04-27 DIAGNOSIS — M25551 Pain in right hip: Secondary | ICD-10-CM | POA: Diagnosis not present

## 2023-05-02 ENCOUNTER — Other Ambulatory Visit (HOSPITAL_BASED_OUTPATIENT_CLINIC_OR_DEPARTMENT_OTHER): Payer: Self-pay

## 2023-05-02 ENCOUNTER — Other Ambulatory Visit (HOSPITAL_COMMUNITY): Payer: Self-pay

## 2023-05-02 MED ORDER — PENICILLIN V POTASSIUM 500 MG PO TABS
500.0000 mg | ORAL_TABLET | Freq: Four times a day (QID) | ORAL | 0 refills | Status: DC
  Filled 2023-05-02: qty 28, 7d supply, fill #0

## 2023-05-02 MED ORDER — HYDROCODONE-ACETAMINOPHEN 5-325 MG PO TABS
1.0000 | ORAL_TABLET | ORAL | 0 refills | Status: DC | PRN
  Filled 2023-05-02: qty 14, 3d supply, fill #0

## 2023-05-05 DIAGNOSIS — M7631 Iliotibial band syndrome, right leg: Secondary | ICD-10-CM | POA: Diagnosis not present

## 2023-05-05 DIAGNOSIS — M7061 Trochanteric bursitis, right hip: Secondary | ICD-10-CM | POA: Diagnosis not present

## 2023-05-11 ENCOUNTER — Other Ambulatory Visit (HOSPITAL_COMMUNITY): Payer: Self-pay

## 2023-05-16 DIAGNOSIS — E871 Hypo-osmolality and hyponatremia: Secondary | ICD-10-CM | POA: Diagnosis not present

## 2023-05-17 ENCOUNTER — Other Ambulatory Visit (HOSPITAL_COMMUNITY): Payer: Self-pay

## 2023-05-17 DIAGNOSIS — L650 Telogen effluvium: Secondary | ICD-10-CM | POA: Diagnosis not present

## 2023-05-17 MED ORDER — LORAZEPAM 1 MG PO TABS
ORAL_TABLET | Freq: Every day | ORAL | 0 refills | Status: DC | PRN
  Filled 2023-05-17: qty 30, 30d supply, fill #0

## 2023-05-30 DIAGNOSIS — S52301A Unspecified fracture of shaft of right radius, initial encounter for closed fracture: Secondary | ICD-10-CM | POA: Diagnosis not present

## 2023-05-30 DIAGNOSIS — S52501A Unspecified fracture of the lower end of right radius, initial encounter for closed fracture: Secondary | ICD-10-CM | POA: Diagnosis not present

## 2023-05-30 DIAGNOSIS — Z87891 Personal history of nicotine dependence: Secondary | ICD-10-CM | POA: Diagnosis not present

## 2023-05-30 DIAGNOSIS — M25531 Pain in right wrist: Secondary | ICD-10-CM | POA: Diagnosis not present

## 2023-05-31 DIAGNOSIS — M25531 Pain in right wrist: Secondary | ICD-10-CM | POA: Diagnosis not present

## 2023-06-01 ENCOUNTER — Ambulatory Visit: Payer: Medicare Other | Admitting: Orthopedic Surgery

## 2023-06-01 ENCOUNTER — Other Ambulatory Visit: Payer: Self-pay

## 2023-06-01 ENCOUNTER — Other Ambulatory Visit (HOSPITAL_COMMUNITY): Payer: Self-pay

## 2023-06-01 DIAGNOSIS — M25531 Pain in right wrist: Secondary | ICD-10-CM

## 2023-06-01 DIAGNOSIS — S52531A Colles' fracture of right radius, initial encounter for closed fracture: Secondary | ICD-10-CM

## 2023-06-02 ENCOUNTER — Other Ambulatory Visit (HOSPITAL_COMMUNITY): Payer: Self-pay

## 2023-06-02 MED ORDER — IBUPROFEN 600 MG PO TABS
600.0000 mg | ORAL_TABLET | Freq: Every day | ORAL | 1 refills | Status: DC | PRN
  Filled 2023-06-02: qty 30, 30d supply, fill #0
  Filled 2023-06-23 – 2023-06-24 (×2): qty 30, 30d supply, fill #1

## 2023-06-06 DIAGNOSIS — M4316 Spondylolisthesis, lumbar region: Secondary | ICD-10-CM | POA: Diagnosis not present

## 2023-06-13 DIAGNOSIS — M47816 Spondylosis without myelopathy or radiculopathy, lumbar region: Secondary | ICD-10-CM | POA: Diagnosis not present

## 2023-06-14 ENCOUNTER — Telehealth: Payer: Self-pay | Admitting: *Deleted

## 2023-06-14 NOTE — Telephone Encounter (Signed)
Returned call to patient in response to vm asking about appt tomorrow. Gave 8/1 appt times 11:15 lab and 11:45 w/Dr. Arbutus Ped. Informed valet parking available for patients. Informed to arrive 15 minutes early. Ms/ Roza verbalize understanding of information

## 2023-06-15 ENCOUNTER — Inpatient Hospital Stay: Payer: Medicare Other

## 2023-06-15 ENCOUNTER — Other Ambulatory Visit (HOSPITAL_COMMUNITY): Payer: Self-pay

## 2023-06-15 ENCOUNTER — Inpatient Hospital Stay: Payer: Medicare Other | Attending: Internal Medicine | Admitting: Internal Medicine

## 2023-06-15 ENCOUNTER — Other Ambulatory Visit: Payer: Self-pay | Admitting: Internal Medicine

## 2023-06-15 ENCOUNTER — Telehealth: Payer: Self-pay

## 2023-06-15 ENCOUNTER — Other Ambulatory Visit: Payer: Self-pay

## 2023-06-15 VITALS — BP 163/76 | HR 72 | Temp 97.3°F | Resp 17 | Ht 67.0 in | Wt 120.4 lb

## 2023-06-15 DIAGNOSIS — Z800 Family history of malignant neoplasm of digestive organs: Secondary | ICD-10-CM | POA: Insufficient documentation

## 2023-06-15 DIAGNOSIS — I4891 Unspecified atrial fibrillation: Secondary | ICD-10-CM

## 2023-06-15 DIAGNOSIS — F419 Anxiety disorder, unspecified: Secondary | ICD-10-CM | POA: Diagnosis not present

## 2023-06-15 DIAGNOSIS — Z8509 Personal history of malignant neoplasm of other digestive organs: Secondary | ICD-10-CM | POA: Diagnosis not present

## 2023-06-15 DIAGNOSIS — Z87891 Personal history of nicotine dependence: Secondary | ICD-10-CM | POA: Insufficient documentation

## 2023-06-15 DIAGNOSIS — E785 Hyperlipidemia, unspecified: Secondary | ICD-10-CM

## 2023-06-15 DIAGNOSIS — Z888 Allergy status to other drugs, medicaments and biological substances status: Secondary | ICD-10-CM | POA: Diagnosis not present

## 2023-06-15 DIAGNOSIS — Z91041 Radiographic dye allergy status: Secondary | ICD-10-CM | POA: Diagnosis not present

## 2023-06-15 DIAGNOSIS — R197 Diarrhea, unspecified: Secondary | ICD-10-CM | POA: Diagnosis not present

## 2023-06-15 DIAGNOSIS — Z885 Allergy status to narcotic agent status: Secondary | ICD-10-CM | POA: Insufficient documentation

## 2023-06-15 DIAGNOSIS — I480 Paroxysmal atrial fibrillation: Secondary | ICD-10-CM | POA: Diagnosis not present

## 2023-06-15 DIAGNOSIS — Z923 Personal history of irradiation: Secondary | ICD-10-CM | POA: Insufficient documentation

## 2023-06-15 DIAGNOSIS — D539 Nutritional anemia, unspecified: Secondary | ICD-10-CM | POA: Diagnosis not present

## 2023-06-15 DIAGNOSIS — R911 Solitary pulmonary nodule: Secondary | ICD-10-CM

## 2023-06-15 DIAGNOSIS — Z8249 Family history of ischemic heart disease and other diseases of the circulatory system: Secondary | ICD-10-CM | POA: Insufficient documentation

## 2023-06-15 DIAGNOSIS — Z881 Allergy status to other antibiotic agents status: Secondary | ICD-10-CM | POA: Diagnosis not present

## 2023-06-15 DIAGNOSIS — Z803 Family history of malignant neoplasm of breast: Secondary | ICD-10-CM | POA: Diagnosis not present

## 2023-06-15 DIAGNOSIS — Z79899 Other long term (current) drug therapy: Secondary | ICD-10-CM | POA: Insufficient documentation

## 2023-06-15 DIAGNOSIS — J449 Chronic obstructive pulmonary disease, unspecified: Secondary | ICD-10-CM | POA: Diagnosis not present

## 2023-06-15 DIAGNOSIS — M549 Dorsalgia, unspecified: Secondary | ICD-10-CM | POA: Diagnosis not present

## 2023-06-15 DIAGNOSIS — D75838 Other thrombocytosis: Secondary | ICD-10-CM | POA: Diagnosis not present

## 2023-06-15 DIAGNOSIS — Z85048 Personal history of other malignant neoplasm of rectum, rectosigmoid junction, and anus: Secondary | ICD-10-CM | POA: Diagnosis not present

## 2023-06-15 DIAGNOSIS — M1990 Unspecified osteoarthritis, unspecified site: Secondary | ICD-10-CM | POA: Insufficient documentation

## 2023-06-15 DIAGNOSIS — D75839 Thrombocytosis, unspecified: Secondary | ICD-10-CM | POA: Diagnosis not present

## 2023-06-15 DIAGNOSIS — C20 Malignant neoplasm of rectum: Secondary | ICD-10-CM

## 2023-06-15 DIAGNOSIS — Z823 Family history of stroke: Secondary | ICD-10-CM | POA: Diagnosis not present

## 2023-06-15 LAB — CMP (CANCER CENTER ONLY)
ALT: 8 U/L (ref 0–44)
AST: 16 U/L (ref 15–41)
Albumin: 3.9 g/dL (ref 3.5–5.0)
Alkaline Phosphatase: 54 U/L (ref 38–126)
Anion gap: 7 (ref 5–15)
BUN: 9 mg/dL (ref 8–23)
CO2: 25 mmol/L (ref 22–32)
Calcium: 9.2 mg/dL (ref 8.9–10.3)
Chloride: 103 mmol/L (ref 98–111)
Creatinine: 0.93 mg/dL (ref 0.44–1.00)
GFR, Estimated: >60 mL/min (ref >60)
Glucose, Bld: 95 mg/dL (ref 70–99)
Potassium: 4.3 mmol/L (ref 3.5–5.1)
Sodium: 135 mmol/L (ref 135–145)
Total Bilirubin: 0.4 mg/dL (ref 0.3–1.2)
Total Protein: 6.4 g/dL — ABNORMAL LOW (ref 6.5–8.1)

## 2023-06-15 LAB — IRON AND IRON BINDING CAPACITY (CC-WL,HP ONLY)
Iron: 98 ug/dL (ref 28–170)
Saturation Ratios: 28 % (ref 10.4–31.8)
TIBC: 356 ug/dL (ref 250–450)
UIBC: 258 ug/dL (ref 148–442)

## 2023-06-15 LAB — VITAMIN B12: Vitamin B-12: 162 pg/mL — ABNORMAL LOW (ref 180–914)

## 2023-06-15 LAB — CBC WITH DIFFERENTIAL (CANCER CENTER ONLY)
Abs Immature Granulocytes: 0.03 10*3/uL (ref 0.00–0.07)
Basophils Absolute: 0.1 10*3/uL (ref 0.0–0.1)
Basophils Relative: 1 %
Eosinophils Absolute: 0.1 10*3/uL (ref 0.0–0.5)
Eosinophils Relative: 1 %
HCT: 35 % — ABNORMAL LOW (ref 36.0–46.0)
Hemoglobin: 11.7 g/dL — ABNORMAL LOW (ref 12.0–15.0)
Immature Granulocytes: 0 %
Lymphocytes Relative: 12 %
Lymphs Abs: 1 10*3/uL (ref 0.7–4.0)
MCH: 27.1 pg (ref 26.0–34.0)
MCHC: 33.4 g/dL (ref 30.0–36.0)
MCV: 81 fL (ref 80.0–100.0)
Monocytes Absolute: 0.5 10*3/uL (ref 0.1–1.0)
Monocytes Relative: 6 %
Neutro Abs: 6.6 10*3/uL (ref 1.7–7.7)
Neutrophils Relative %: 80 %
Platelet Count: 412 10*3/uL — ABNORMAL HIGH (ref 150–400)
RBC: 4.32 MIL/uL (ref 3.87–5.11)
RDW: 14 % (ref 11.5–15.5)
WBC Count: 8.2 10*3/uL (ref 4.0–10.5)
nRBC: 0 % (ref 0.0–0.2)

## 2023-06-15 LAB — FERRITIN: Ferritin: 12 ng/mL (ref 11–307)

## 2023-06-15 LAB — FOLATE: Folate: 16.1 ng/mL (ref >5.9)

## 2023-06-15 MED ORDER — VITAMIN B-12 1000 MCG PO TABS
1000.0000 ug | ORAL_TABLET | Freq: Every day | ORAL | 2 refills | Status: AC
Start: 2023-06-15 — End: ?
  Filled 2023-06-15: qty 30, 30d supply, fill #0

## 2023-06-15 MED ORDER — VITAMIN B-12 1000 MCG PO TABS: 1000.0000 ug | ORAL_TABLET | Freq: Every day | ORAL | 2 refills | Status: DC

## 2023-06-15 NOTE — Progress Notes (Signed)
[facility] Telephone:([phone]) [phone]   [phone]  CONSULT NOTE  REFERRING PHYSICIAN: Dr. Irven Coe  REASON FOR CONSULTATION:  77 years old white female with persistent thrombocytosis.  HPI Julie Mann is a 77 y.o. female with past medical history significant for osteoarthritis, history of anal cancer in 2008 status post concurrent chemoradiation and she was followed by Dr. Darnelle Catalan at that time.  The patient also has history of COPD, osteoarthritis, dyslipidemia, dysrhythmia, osteoporosis, atrial fibrillation.  She was seen by her primary care provider for routine evaluation and during her blood work she had CBC on 05/16/2023 that showed mild anemia with hemoglobin of 11.4 and hematocrit 35.1.  She had normal white blood count of 7.6 but platelets count were elevated at 478,000.  The patient had similar results of elevated platelets count on previous blood work for example on 03/21/2023 her platelets count were elevated at 403,000 and on 03/27/2023 it was up to 439,000.  The patient is feeling fine with no concerning complaints except for mild bruising.  She also complains of low back pain and sciatica.  She was referred to me today for evaluation and recommendation regarding her elevated platelet count. When seen today she has no concerning complaints except for the back pain and sciatica.  She is currently undergoing physical therapy.  She feels tired and fatigued most of the time but no dizzy spells.  She denied having any shortness of breath and no chest pain, cough or hemoptysis.  She has no nausea, vomiting but has intermittent diarrhea since her treatment for the anal cancer.  She lost around 10 pounds in the last 2 years. Family history significant for father with a stroke and died at age 76.  Mother died at age 59 from old age and she had a sister who was diagnosed with breast cancer. The patient is married and has 1 son.  She is a violinist.  She was accompanied by  her husband Product manager.  She has no history for smoking, alcohol or drug abuse.  HPI  Past Medical History:  Diagnosis Date   Anal cancer (HCC) 2005   SCCa anus-stage II chemo, radiation   Anxiety    Arthritis    "hands, fingers, back" (02/27/2014)   Atrial fibrillation (HCC)    Cat scratch of right hand 02/24/2014   COPD (chronic obstructive pulmonary disease) (HCC)    Dyspnea    walking up hill;.  stairs (If Iam tired)   Dysrhythmia    PAF   Fall from horse 01/2018   Fracture, ankle 02/2019   Right    High cholesterol    History of stomach ulcers ~ 1966   Osteopenia     Past Surgical History:  Procedure Laterality Date   ANUS SURGERY  2005   "biopsy"   BREAST CYST EXCISION Right 1966   DILATION AND CURETTAGE OF UTERUS  1980's   S/P miscarriage   EXTERNAL FIXATION LEG Right 03/10/2019   Procedure: OPEN REDUCTION INTERNAL FIXATION RIGHT ANKLE;  Surgeon: Teryl Lucy, MD;  Location: [facility] OR;  Service: Orthopedics;  Laterality: Right;   HARDWARE REMOVAL Right 02/05/2021   Procedure: REMOVAL OF HARDWARE RIGHT ANKLE;  Surgeon: Nadara Mustard, MD;  Location: [facility] OR;  Service: Orthopedics;  Laterality: Right;   VARICOSE VEIN SURGERY Left    left leg   VIDEO BRONCHOSCOPY WITH ENDOBRONCHIAL NAVIGATION Right 08/25/2020   Procedure: VIDEO BRONCHOSCOPY WITH ENDOBRONCHIAL NAVIGATION WITH FIDUCIAL PLACEMENT;  Surgeon: Audie Box  L, DO;  Location: [facility] OR;  Service: Pulmonary;  Laterality: Right;    Family History  Problem Relation Age of Onset   Breast cancer Maternal Aunt    Breast cancer Maternal Grandmother    Heart disease Mother    Stroke Father    Rectal cancer Paternal Grandmother    Breast cancer Sister 63   Breast cancer Sister 60    Social History Social History   Tobacco Use   Smoking status: Former    Current packs/day: 0.00    Average packs/day: 1 pack/day for 30.0 years (30.0 ttl pk-yrs)    Types: Cigarettes    Start date: 79    Quit date: 2005     Years since quitting: 19.5   Smokeless tobacco: Never  Vaping Use   Vaping status: Never Used  Substance Use Topics   Alcohol use: No   Drug use: No    Allergies  Allergen Reactions   Duloxetine Other (See Comments)    Pt states put her in a coma   Eliquis [Apixaban] Anaphylaxis    Made her feel sick   Ivp Dye [Iodinated Contrast Media] Swelling    01/05/2022 small amount of contrast used in Transforaminal epidural without problem.   Oxycodone Other (See Comments)    Went into Afib.    Tiotropium Bromide Monohydrate Other (See Comments)    Headache, Blurry vision, chest heaviness.   Conjugated Estrogens     Felt weird Other reaction(s): hallucinations   Doxycycline Other (See Comments)    Dizziness and tingling   Erythromycin Other (See Comments)    Hallucinations    Flonase [Fluticasone Propionate]    Hydrocodone    Keflex [Cephalexin] Other (See Comments)    Hallucinations.   Other     Other reaction(s): hallucinations Other reaction(s): sedation Other reaction(s): hallucinations   Paxil [Paroxetine Hcl] Diarrhea   Prednisone     blurry eyes   Provera [Medroxyprogesterone Acetate]     Patient do not remember reaction   Xarelto [Rivaroxaban]     Made her feel sick   Zithromax [Azithromycin] Other (See Comments)    hallucinations   Zoloft [Sertraline Hcl] Other (See Comments)    sedation    Current Outpatient Medications  Medication Sig Dispense Refill   amoxicillin (AMOXIL) 875 MG tablet Take 1 tablet (875 mg total) by mouth 2 (two) times daily. 20 tablet 0   APPLE CIDER VINEGAR PO Take 5 mLs by mouth daily as needed (Constipation).     aspirin EC 81 MG tablet Take 1 tablet (81 mg total) by mouth daily. 90 tablet 3   atorvastatin (LIPITOR) 10 MG tablet Take 1 tablet (10 mg total) by mouth 2 (two) times a week on monday and friday 26 tablet 3   HYDROcodone-acetaminophen (NORCO) 7.5-325 MG tablet Take 1 tablet by mouth every 8 (eight) hours as needed for 10 days  30 tablet 0   HYDROcodone-acetaminophen (NORCO/VICODIN) 5-325 MG tablet Take 1 tablet by mouth every 4 (four) hours to 6 (six) hours as needed for pain. 14 tablet 0   ibuprofen (ADVIL) 600 MG tablet Take 1 tablet (600 mg total) by mouth daily as needed. 30 tablet 1   influenza vaccine adjuvanted (FLUAD QUADRIVALENT) 0.5 ML injection Inject 0.5 mLs into the muscle. 0.5 mL 0   levofloxacin (LEVAQUIN) 500 MG tablet Take 1 tablet (500 mg total) by mouth daily. 10 tablet 0   LORazepam (ATIVAN) 1 MG tablet Take 1 tablet (1 mg total) by mouth  at bedtime as needed for anxiety or sleep. 30 tablet 2   LORazepam (ATIVAN) 1 MG tablet Take 1/2-1 tablet (0.5-1mg ) by mouth  as needed. 30 tablet 0   metoprolol succinate (TOPROL XL) 25 MG 24 hr tablet Take 1 tablet (25 mg total) by mouth daily. 90 tablet 3   metoprolol tartrate (LOPRESSOR) 25 MG tablet Take 25 mg by mouth daily as needed (palpitations/AFIB).      Multiple Vitamins-Minerals (MULTIVITAMIN PO) Take 1 tablet by mouth daily. One a day     olmesartan (BENICAR) 20 MG tablet Take 0.5 tablets (10 mg total) by mouth daily. (Patient not taking: Reported on 03/24/2023) 30 tablet 1   OVER THE COUNTER MEDICATION Take 5 mLs by mouth daily as needed (Constipation). Olive oil     penicillin v potassium (VEETID) 500 MG tablet Take 1 tablet (500 mg total) by mouth 4 (four) times daily until complete. 28 tablet 0   Propylene Glycol-Glycerin 0.6-0.6 % SOLN Place 1 drop into both eyes 2 (two) times daily. Soothe Dry eyes     sulfamethoxazole-trimethoprim (BACTRIM DS) 800-160 MG tablet Take 1 tablet by mouth every 12 (twelve) hours for 10 days 20 tablet 0   traMADol (ULTRAM) 50 MG tablet Take 1 tablet (50 mg total) by mouth every 12 (twelve) hours as needed. 30 tablet 0   triamcinolone cream (KENALOG) 0.1 % Apply topically to affected areas up to twice daily as needed. Do not apply to face, groin, or underarm 454 g 3   valACYclovir (VALTREX) 1000 MG tablet Take 2 tablets  (2,000 mg total) by mouth at onset of symptoms; repeat dose in 12 hours as directed. Take with a full galss of water 10 tablet 3   No current facility-administered medications for this visit.    Review of Systems  Constitutional: positive for fatigue Eyes: negative Ears, nose, mouth, throat, and face: negative Respiratory: negative Cardiovascular: negative Gastrointestinal: positive for diarrhea Genitourinary:negative Integument/breast: negative Hematologic/lymphatic: negative Musculoskeletal:positive for arthralgias and back pain Neurological: negative Behavioral/Psych: negative Endocrine: negative Allergic/Immunologic: negative  Physical Exam  XLK:GMWNU, healthy, no distress, well nourished, and well developed SKIN: skin color, texture, turgor are normal, no rashes or significant lesions HEAD: Normocephalic, No masses, lesions, tenderness or abnormalities EYES: normal, PERRLA, Conjunctiva are pink and non-injected EARS: External ears normal, Canals clear OROPHARYNX:no exudate, no erythema, and lips, buccal mucosa, and tongue normal  NECK: supple, no adenopathy, no JVD LYMPH:  no palpable lymphadenopathy, no hepatosplenomegaly BREAST:not examined LUNGS: clear to auscultation , and palpation HEART: regular rate & rhythm, no murmurs, and no gallops ABDOMEN:abdomen soft, non-tender, normal bowel sounds, and no masses or organomegaly BACK: Back symmetric, no curvature., No CVA tenderness EXTREMITIES:no joint deformities, effusion, or inflammation, no edema  NEURO: alert & oriented x 3 with fluent speech, no focal motor/sensory deficits  PERFORMANCE STATUS: ECOG 1  LABORATORY DATA: Lab Results  Component Value Date   WBC 8.2 06/15/2023   HGB 11.7 (L) 06/15/2023   HCT 35.0 (L) 06/15/2023   MCV 81.0 06/15/2023   PLT 412 (H) 06/15/2023      Chemistry      Component Value Date/Time   NA 134 (L) 03/27/2023 1505   NA 138 12/08/2022 1038   K 3.9 03/27/2023 1505   CL 102  03/27/2023 1505   CO2 22 03/27/2023 1505   BUN 10 03/27/2023 1505   BUN 16 12/08/2022 1038   CREATININE 0.92 03/27/2023 1505      Component Value Date/Time   CALCIUM  9.2 03/27/2023 1505   ALKPHOS 44 03/21/2023 1934   AST 16 03/21/2023 1934   ALT 8 03/21/2023 1934   BILITOT 0.4 03/21/2023 1934   BILITOT 0.4 12/08/2022 1038       RADIOGRAPHIC STUDIES: No results found.  ASSESSMENT: This is a very pleasant 77 years old white female with a lot of comorbidities presented for evaluation of thrombocytosis likely reactive in nature but essential thrombocythemia could not be completely excluded at this point.  The patient also has mild anemia.  PLAN: I had a lengthy discussion with the patient and her husband today about her current condition and further investigation to identify the etiology of her thrombocytosis. I ordered several studies today including repeat CBC that showed normal total white blood count of 8.2 but she has mild anemia with hemoglobin of 11.7 and hematocrit 35.0% with platelets count of 412,000.  Comprehensive metabolic panel is unremarkable.  Iron study was normal the patient has normal serum folate but low vitamin B12. Other lab results including JAK2 mutation panel is still pending. I will start the patient on oral vitamin B12 supplements. She was assured that her thrombocytosis is very mild and likely reactive in nature but we will wait for the JAK2 mutation panel to rule out myeloproliferative disorder. I will arrange for her to come back for follow-up visit in 2 months for evaluation and repeat blood work. She was advised to call immediately if she has any other concerning symptoms in the interval.  The patient voices understanding of current disease status and treatment options and is in agreement with the current care plan.  All questions were answered. The patient knows to call the clinic with any problems, questions or concerns. We can certainly see the patient  much sooner if necessary.  Thank you so much for allowing me to participate in the care of Julie Mann. I will continue to follow up the patient with you and assist in her care.  The total time spent in the appointment was 60 minutes.  Disclaimer: This note was dictated with voice recognition software. Similar sounding words can inadvertently be transcribed and may not be corrected upon review.   Lajuana Matte June 15, 2023, 12:07 PM

## 2023-06-15 NOTE — Telephone Encounter (Signed)
Patient called regarding low B12 lab result.  Informed patient that Dr. Arbutus Ped has prescribed her a B12 supplement that she is to take daily. Patient verbalized an understanding of the information. Patient requested that prescription be sent instead to the [facility]. Dr. Arbutus Ped aware and agreeable to change pharmacy.

## 2023-06-16 ENCOUNTER — Other Ambulatory Visit: Payer: Self-pay

## 2023-06-16 ENCOUNTER — Other Ambulatory Visit (HOSPITAL_COMMUNITY): Payer: Self-pay

## 2023-06-16 MED ORDER — LORAZEPAM 1 MG PO TABS
0.5000 mg | ORAL_TABLET | ORAL | 0 refills | Status: DC | PRN
Start: 2023-06-16 — End: 2023-09-21
  Filled 2023-06-16: qty 30, 30d supply, fill #0

## 2023-06-18 ENCOUNTER — Encounter: Payer: Self-pay | Admitting: Orthopedic Surgery

## 2023-06-18 NOTE — Progress Notes (Signed)
Office Visit Note   Patient: Julie Mann           Date of Birth: 10-11-1946           MRN: [id number] Visit Date: 06/01/2023              Requested by: Irven Coe, MD [address] [place],  [place] [address] PCP: Irven Coe, MD  Chief Complaint  Patient presents with   Right Wrist - Fracture      HPI: Patient is a 77 year old woman who is seen for initial evaluation for metaphyseal fracture right wrist.  Patient states that she was at the beach and fell on some toys.  Assessment & Plan: Visit Diagnoses:  1. Pain in right wrist   2. Closed Colles' fracture of right radius, initial encounter     Plan: Recommend a long-arm Velcro splint.  Repeat two-view radiographs of the right wrist at follow-up.  Follow-Up Instructions: No follow-ups on file.   Ortho Exam  Patient is alert, oriented, no adenopathy, well-dressed, normal affect, normal respiratory effort. Review of the radiographs shows a nondisplaced metaphyseal fracture of the right wrist.  No intra-articular extension.  No open wounds no cellulitis no signs of infection.  Imaging: No results found. No images are attached to the encounter.  Labs: Lab Results  Component Value Date   REPTSTATUS 02/10/2021 FINAL 02/05/2021   GRAMSTAIN  02/05/2021    MODERATE WBC PRESENT,BOTH PMN AND MONONUCLEAR NO ORGANISMS SEEN    CULT  02/05/2021    RARE STAPHYLOCOCCUS CAPITIS RARE STAPHYLOCOCCUS EPIDERMIDIS NO ANAEROBES ISOLATED Performed at [facility], [address] [place], [place] [address]    [facility] STAPHYLOCOCCUS CAPITIS 02/05/2021   LABORGA STAPHYLOCOCCUS EPIDERMIDIS 02/05/2021     Lab Results  Component Value Date   ALBUMIN 3.9 06/15/2023   ALBUMIN 4.3 03/21/2023   ALBUMIN 4.2 12/08/2022    No results found for: "MG" No results found for: "VD25OH"  No results found for: "PREALBUMIN"    Latest Ref Rng & Units 06/15/2023   11:22 AM 03/27/2023    3:05 PM 03/21/2023    7:34 PM   CBC EXTENDED  WBC 4.0 - 10.5 K/uL 8.2  8.4  8.3   RBC 3.87 - 5.11 MIL/uL 4.32  4.53  4.58   Hemoglobin 12.0 - 15.0 g/dL 21.3  08.6  57.8   HCT 36.0 - 46.0 % 35.0  38.2  38.6   Platelets 150 - 400 K/uL 412  439  403   NEUT# 1.7 - 7.7 K/uL 6.6     Lymph# 0.7 - 4.0 K/uL 1.0        There is no height or weight on file to calculate BMI.  Orders:  Orders Placed This Encounter  Procedures   XR Wrist Complete Right   No orders of the defined types were placed in this encounter.    Procedures: No procedures performed  Clinical Data: No additional findings.  ROS:  All other systems negative, except as noted in the HPI. Review of Systems  Objective: Vital Signs: LMP 11/14/1992   Specialty Comments:  EXAM: MRI LUMBAR SPINE WITHOUT CONTRAST   TECHNIQUE: Multiplanar, multisequence MR imaging of the lumbar spine was performed. No intravenous contrast was administered.   COMPARISON:  Lumbar spine MRI 05/30/2012   FINDINGS: Segmentation:  Standard.   Alignment: Slight lower lumbar levoscoliosis. No significant listhesis.   Vertebrae: No fracture or suspicious marrow lesion. Mild degenerative endplate edema at I6-N6.  Conus medullaris and cauda equina: Conus extends to the T12 level and is suboptimally evaluated as it was not covered on axial images. Unremarkable appearance of the cauda equina.   Paraspinal and other soft tissues: Small renal cysts.   Disc levels:   Disc desiccation throughout the lumbar spine with exception of L3-4. Progressive, severe disc space narrowing at L5-S1.   L1-2: Negative.   L2-3: At most minimal disc bulging without stenosis.   L3-4: Minimal leftward disc bulging without stenosis.   L4-5: Disc bulging and moderate facet and ligamentum flavum hypertrophy, mildly progressed but without associated stenosis. Small synovial cysts posterior to the facet joints bilaterally, not in a position to cause neural impingement.   L5-S1: Disc  bulging, endplate spurring, disc space height loss, and mild facet hypertrophy without significant stenosis.   IMPRESSION: 1. Mild progression advanced disc degeneration at L5-S1 without stenosis. 2. Mildly progressive facet hypertrophy at L4-5 without stenosis.     Electronically Signed   By: Sebastian Ache M.D.   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/27/2021   Hardware complicating wound infection (HCC)    Neck pain 10/05/2020   Nodule of upper lobe of right lung 08/17/2020   Biceps tendonosis of left shoulder 02/13/2020   Impingement syndrome of left shoulder 02/13/2020   Post-operative state 02/13/2020   Ankle dislocation, right, initial encounter 03/10/2019   Trimalleolar fracture of ankle, closed, right, initial encounter    Chronic left shoulder pain 02/19/2019   Anxiety 12/17/2016   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  stairs (If Iam tired)   Dysrhythmia    PAF   Fall from horse 01/2018   Fracture, ankle 02/2019   Right    High cholesterol    History  of stomach ulcers ~ 1966   Osteopenia     Family History  Problem Relation Age of Onset   Breast cancer Maternal Aunt    Breast cancer Maternal Grandmother    Heart disease Mother    Stroke Father    Rectal cancer Paternal Grandmother    Breast cancer Sister 77   Breast cancer Sister 36    Past Surgical History:  Procedure Laterality Date   ANUS SURGERY  2005   "biopsy"   BREAST CYST EXCISION Right 1966   DILATION AND CURETTAGE OF UTERUS  1980's   S/P miscarriage   EXTERNAL FIXATION LEG Right 03/10/2019   Procedure: OPEN REDUCTION INTERNAL FIXATION RIGHT ANKLE;  Surgeon: Teryl Lucy, MD;  Location: [facility] OR;  Service: Orthopedics;  Laterality: Right;   HARDWARE REMOVAL Right 02/05/2021   Procedure: REMOVAL OF HARDWARE RIGHT ANKLE;  Surgeon: Nadara Mustard, MD;  Location[facility];  Service: Orthopedics;  Laterality: Right;   VARICOSE VEIN SURGERY Left    left leg   VIDEO BRONCHOSCOPY WITH ENDOBRONCHIAL NAVIGATION Right 08/25/2020   Procedure: VIDEO BRONCHOSCOPY WITH ENDOBRONCHIAL NAVIGATION WITH FIDUCIAL PLACEMENT;  Surgeon: Josephine Igo, DO;  Location: [facility] OR;  Service: Pulmonary;  Laterality: Right;   Social History   Occupational History   Occupation: Violinist  Tobacco Use   Smoking status: Former    Current packs/day: 0.00    Average packs/day: 1 pack/day for 30.0 years (30.0 ttl pk-yrs)    Types: Cigarettes    Start date: 51    Quit date: 2005    Years since quitting: 19.6   Smokeless tobacco: Never  Vaping Use   Vaping status: Never Used  Substance and Sexual Activity   Alcohol use: No   Drug use: No   Sexual activity: Not Currently    Partners: Male    Birth control/protection: Post-menopausal

## 2023-06-19 ENCOUNTER — Inpatient Hospital Stay: Admission: RE | Admit: 2023-06-19 | Payer: Medicare Other | Source: Ambulatory Visit

## 2023-06-19 ENCOUNTER — Other Ambulatory Visit (HOSPITAL_COMMUNITY): Payer: Self-pay

## 2023-06-20 ENCOUNTER — Other Ambulatory Visit (INDEPENDENT_AMBULATORY_CARE_PROVIDER_SITE_OTHER): Payer: Medicare Other

## 2023-06-20 ENCOUNTER — Encounter: Payer: Self-pay | Admitting: Orthopedic Surgery

## 2023-06-20 ENCOUNTER — Ambulatory Visit: Payer: Medicare Other | Admitting: Orthopedic Surgery

## 2023-06-20 ENCOUNTER — Other Ambulatory Visit: Payer: Self-pay

## 2023-06-20 DIAGNOSIS — S52531A Colles' fracture of right radius, initial encounter for closed fracture: Secondary | ICD-10-CM | POA: Diagnosis not present

## 2023-06-20 DIAGNOSIS — M79671 Pain in right foot: Secondary | ICD-10-CM | POA: Diagnosis not present

## 2023-06-20 NOTE — Progress Notes (Signed)
Office Visit Note   Patient: Julie Mann           Date of Birth: [birth year]           MRN: [id number] Visit Date: 06/20/2023              Requested by: Irven Coe, MD [address] [place],  [place] [address] PCP: Irven Coe, MD  Chief Complaint  Patient presents with   Right Foot - Pain   Right Wrist - Fracture      HPI: Patient is a 77 year old woman who is seen in follow-up for nondisplaced metaphyseal right distal radius fracture.  Patient states she has been having pain recently across the midfoot.  Assessment & Plan: Visit Diagnoses:  1. Pain in right foot   2. Closed Colles' fracture of right radius, initial encounter     Plan: Patient will advance her activities as tolerated with the wrist.  Recommended a stiff soled shoe for the midfoot pain we will place her in a postoperative shoe.  Follow-Up Instructions: Return if symptoms worsen or fail to improve.   Ortho Exam  Patient is alert, oriented, no adenopathy, well-dressed, normal affect, normal respiratory effort. Examination the fracture is nontender to palpation at the distal radius.  Her hand has good range of motion and function.  The midfoot is tender to palpation but radiograph shows no fractures.  Imaging: XR Wrist Complete Right  Result Date: 06/20/2023 2 view radiographs of the right wrist shows subsidence of the metaphyseal fracture with 10 degree dorsiflexion of the joint.  The joint space is congruent.  XR Foot 2 Views Right  Result Date: 06/20/2023 2 view radiographs of the right foot shows no fractures no dislocation.  No images are attached to the encounter.  Labs: Lab Results  Component Value Date   REPTSTATUS 02/10/2021 FINAL 02/05/2021   GRAMSTAIN  02/05/2021    MODERATE WBC PRESENT,BOTH PMN AND MONONUCLEAR NO ORGANISMS SEEN    CULT  02/05/2021    RARE STAPHYLOCOCCUS CAPITIS RARE STAPHYLOCOCCUS EPIDERMIDIS NO ANAEROBES ISOLATED Performed at [facility], [address] [place], [place] [address]    [facility] STAPHYLOCOCCUS CAPITIS 02/05/2021   LABORGA STAPHYLOCOCCUS EPIDERMIDIS 02/05/2021     Lab Results  Component Value Date   ALBUMIN 3.9 06/15/2023   ALBUMIN 4.3 03/21/2023   ALBUMIN 4.2 12/08/2022    No results found for: "MG" No results found for: "VD25OH"  No results found for: "PREALBUMIN"    Latest Ref Rng & Units 06/15/2023   11:22 AM 03/27/2023    3:05 PM 03/21/2023    7:34 PM  CBC EXTENDED  WBC 4.0 - 10.5 K/uL 8.2  8.4  8.3   RBC 3.87 - 5.11 MIL/uL 4.32  4.53  4.58   Hemoglobin 12.0 - 15.0 g/dL 82.9  56.2  13.0   HCT 36.0 - 46.0 % 35.0  38.2  38.6   Platelets 150 - 400 K/uL 412  439  403   NEUT# 1.7 - 7.7 K/uL 6.6     Lymph# 0.7 - 4.0 K/uL 1.0        There is no height or weight on file to calculate BMI.  Orders:  Orders Placed This Encounter  Procedures   XR Foot 2 Views Right   XR Wrist Complete Right   No orders of the defined types were placed in this encounter.    Procedures: No procedures performed  Clinical Data: No additional findings.  ROS:  All other systems negative, except as noted in the HPI. Review of Systems  Objective: Vital Signs: LMP 11/14/1992   Specialty Comments:  EXAM: MRI LUMBAR SPINE WITHOUT CONTRAST   TECHNIQUE: Multiplanar, multisequence MR imaging of the lumbar spine was performed. No intravenous contrast was administered.   COMPARISON:  Lumbar spine MRI 05/30/2012   FINDINGS: Segmentation:  Standard.   Alignment: Slight lower lumbar levoscoliosis. No significant listhesis.   Vertebrae: No fracture or suspicious marrow lesion. Mild degenerative endplate edema at V6-H6.   Conus medullaris and cauda equina: Conus extends to the T12 level and is suboptimally evaluated as it was not covered on axial images. Unremarkable appearance of the cauda equina.   Paraspinal and other soft tissues: Small renal cysts.   Disc levels:   Disc desiccation throughout the  lumbar spine with exception of L3-4. Progressive, severe disc space narrowing at L5-S1.   L1-2: Negative.   L2-3: At most minimal disc bulging without stenosis.   L3-4: Minimal leftward disc bulging without stenosis.   L4-5: Disc bulging and moderate facet and ligamentum flavum hypertrophy, mildly progressed but without associated stenosis. Small synovial cysts posterior to the facet joints bilaterally, not in a position to cause neural impingement.   L5-S1: Disc bulging, endplate spurring, disc space height loss, and mild facet hypertrophy without significant stenosis.   IMPRESSION: 1. Mild progression advanced disc degeneration at L5-S1 without stenosis. 2. Mildly progressive facet hypertrophy at L4-5 without stenosis.     Electronically Signed   By: Sebastian Ache M.D.   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/27/2021   Hardware complicating wound infection (HCC)    Neck pain 10/05/2020   Nodule of upper lobe of right lung 08/17/2020   Biceps tendonosis of left shoulder 02/13/2020   Impingement syndrome of left shoulder 02/13/2020   Post-operative state 02/13/2020   Ankle dislocation, right, initial encounter 03/10/2019   Trimalleolar fracture of ankle, closed, right, initial encounter    Chronic left shoulder pain 02/19/2019   Anxiety 12/17/2016   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  stairs (If Iam tired)   Dysrhythmia    PAF   Fall from horse 01/2018   Fracture, ankle 02/2019   Right    High cholesterol    History of stomach ulcers ~ 1966   Osteopenia     Family History  Problem Relation Age of Onset   Breast cancer Maternal Aunt    Breast cancer Maternal Grandmother    Heart disease Mother  Stroke Father    Rectal cancer Paternal Grandmother    Breast cancer Sister 69   Breast cancer Sister 42    Past Surgical History:  Procedure Laterality Date   ANUS SURGERY  2005   "biopsy"   BREAST CYST EXCISION Right 1966   DILATION AND CURETTAGE OF UTERUS  1980's   S/P miscarriage   EXTERNAL FIXATION LEG Right 03/10/2019   Procedure: OPEN REDUCTION INTERNAL FIXATION RIGHT ANKLE;  Surgeon: Teryl Lucy, MD;  Location: [facility] OR;  Service: Orthopedics;  Laterality: Right;   HARDWARE REMOVAL Right 02/05/2021   Procedure: REMOVAL OF HARDWARE RIGHT ANKLE;  Surgeon: Nadara Mustard, MD;  Location: [facility] OR;  Service: Orthopedics;  Laterality: Right;   VARICOSE VEIN SURGERY Left    left leg   VIDEO BRONCHOSCOPY WITH ENDOBRONCHIAL NAVIGATION Right 08/25/2020   Procedure: VIDEO BRONCHOSCOPY WITH ENDOBRONCHIAL NAVIGATION WITH FIDUCIAL PLACEMENT;  Surgeon: Josephine Igo, DO;  Location: [facility] OR;  Service: Pulmonary;  Laterality: Right;   Social History   Occupational History   Occupation: Violinist  Tobacco Use   Smoking status: Former    Current packs/day: 0.00    Average packs/day: 1 pack/day for 30.0 years (30.0 ttl pk-yrs)    Types: Cigarettes    Start date: 61    Quit date: 2005    Years since quitting: 19.6   Smokeless tobacco:  Never  Vaping Use   Vaping status: Never Used  Substance and Sexual Activity   Alcohol use: No   Drug use: No   Sexual activity: Not Currently    Partners: Male    Birth control/protection: Post-menopausal

## 2023-06-23 ENCOUNTER — Other Ambulatory Visit: Payer: Self-pay

## 2023-06-26 ENCOUNTER — Other Ambulatory Visit (HOSPITAL_COMMUNITY): Payer: Self-pay

## 2023-06-29 ENCOUNTER — Encounter: Payer: Self-pay | Admitting: Orthopedic Surgery

## 2023-06-29 ENCOUNTER — Ambulatory Visit: Payer: Medicare Other | Admitting: Orthopedic Surgery

## 2023-06-29 DIAGNOSIS — M79671 Pain in right foot: Secondary | ICD-10-CM

## 2023-06-29 NOTE — Progress Notes (Signed)
Office Visit Note   Patient: Julie Mann           Date of Birth: December 18, 1945           MRN: [id number] Visit Date: 06/29/2023              Requested by: Irven Coe, MD [address] [place],  [place] [address] PCP: Irven Coe, MD  Chief Complaint  Patient presents with   Right Foot - Pain      HPI: Patient is a 77 year old woman who presents in follow-up for midfoot sprain where she states she bent her foot backwards.  Patient was in a postoperative shoe but she states she cannot walk in the shoe and she is currently walking in soft sandals.  Patient states she is still symptomatic.  Assessment & Plan: Visit Diagnoses:  1. Pain in right foot     Plan: Recommended the postoperative shoe or a stiff soled sneaker that does not bend.  Discussed the importance of offloading the pressure on the midfoot sprain.  Discussed that we are trying to avoid surgery.  Follow-Up Instructions: Return in about 4 weeks (around 07/27/2023).   Ortho Exam  Patient is alert, oriented, no adenopathy, well-dressed, normal affect, normal respiratory effort. Examination patient has pain to palpation over the entire dorsum of the right foot.  Distraction across the Lisfranc complex reproduces her pain.  Imaging: No results found. No images are attached to the encounter.  Labs: Lab Results  Component Value Date   REPTSTATUS 02/10/2021 FINAL 02/05/2021   GRAMSTAIN  02/05/2021    MODERATE WBC PRESENT,BOTH PMN AND MONONUCLEAR NO ORGANISMS SEEN    CULT  02/05/2021    RARE STAPHYLOCOCCUS CAPITIS RARE STAPHYLOCOCCUS EPIDERMIDIS NO ANAEROBES ISOLATED Performed at [facility], [address], [place], [place] [address]    [facility] STAPHYLOCOCCUS CAPITIS 02/05/2021   LABORGA STAPHYLOCOCCUS EPIDERMIDIS 02/05/2021     Lab Results  Component Value Date   ALBUMIN 3.9 06/15/2023   ALBUMIN 4.3 03/21/2023   ALBUMIN 4.2 12/08/2022    No results found for: "MG" No results  found for: "VD25OH"  No results found for: "PREALBUMIN"    Latest Ref Rng & Units 06/15/2023   11:22 AM 03/27/2023    3:05 PM 03/21/2023    7:34 PM  CBC EXTENDED  WBC 4.0 - 10.5 K/uL 8.2  8.4  8.3   RBC 3.87 - 5.11 MIL/uL 4.32  4.53  4.58   Hemoglobin 12.0 - 15.0 g/dL 52.8  41.3  24.4   HCT 36.0 - 46.0 % 35.0  38.2  38.6   Platelets 150 - 400 K/uL 412  439  403   NEUT# 1.7 - 7.7 K/uL 6.6     Lymph# 0.7 - 4.0 K/uL 1.0        There is no height or weight on file to calculate BMI.  Orders:  No orders of the defined types were placed in this encounter.  No orders of the defined types were placed in this encounter.    Procedures: No procedures performed  Clinical Data: No additional findings.  ROS:  All other systems negative, except as noted in the HPI. Review of Systems  Objective: Vital Signs: LMP 11/14/1992   Specialty Comments:  EXAM: MRI LUMBAR SPINE WITHOUT CONTRAST   TECHNIQUE: Multiplanar, multisequence MR imaging of the lumbar spine was performed. No intravenous contrast was administered.   COMPARISON:  Lumbar spine MRI 05/30/2012   FINDINGS: Segmentation:  Standard.   Alignment: Slight lower lumbar levoscoliosis. No significant listhesis.   Vertebrae: No fracture or suspicious marrow lesion. Mild degenerative endplate edema at K7-Q2.   Conus medullaris and cauda equina: Conus extends to the T12 level and is suboptimally evaluated as it was not covered on axial images. Unremarkable appearance of the cauda equina.   Paraspinal and other soft tissues: Small renal cysts.   Disc levels:   Disc desiccation throughout the lumbar spine with exception of L3-4. Progressive, severe disc space narrowing at L5-S1.   L1-2: Negative.   L2-3: At most minimal disc bulging without stenosis.   L3-4: Minimal leftward disc bulging without stenosis.   L4-5: Disc bulging and moderate facet and ligamentum flavum hypertrophy, mildly progressed but without  associated stenosis. Small synovial cysts posterior to the facet joints bilaterally, not in a position to cause neural impingement.   L5-S1: Disc bulging, endplate spurring, disc space height loss, and mild facet hypertrophy without significant stenosis.   IMPRESSION: 1. Mild progression advanced disc degeneration at L5-S1 without stenosis. 2. Mildly progressive facet hypertrophy at L4-5 without stenosis.     Electronically Signed   By: Sebastian Ache M.D.   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/27/2021   Hardware complicating wound infection (HCC)    Neck pain 10/05/2020   Nodule of upper lobe of right lung 08/17/2020   Biceps tendonosis of left shoulder 02/13/2020   Impingement syndrome of left shoulder 02/13/2020   Post-operative state 02/13/2020   Ankle dislocation, right, initial encounter 03/10/2019   Trimalleolar fracture of ankle, closed, right, initial encounter    Chronic left shoulder pain 02/19/2019   Anxiety 12/17/2016   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  stairs (If Iam tired)   Dysrhythmia    PAF   Fall from horse 01/2018   Fracture, ankle 02/2019   Right    High cholesterol    History of stomach ulcers ~ 1966   Osteopenia     Family History  Problem Relation Age of Onset   Breast cancer Maternal Aunt    Breast cancer Maternal Grandmother    Heart disease Mother    Stroke Father    Rectal cancer Paternal Grandmother    Breast cancer Sister 63   Breast cancer Sister 30    Past Surgical History:  Procedure Laterality Date   ANUS SURGERY  2005   "biopsy"   BREAST CYST EXCISION Right 1966   DILATION AND CURETTAGE OF UTERUS  1980's   S/P miscarriage  EXTERNAL FIXATION LEG Right 03/10/2019   Procedure: OPEN REDUCTION INTERNAL FIXATION RIGHT ANKLE;  Surgeon: Teryl Lucy, MD;  Location: [facility] OR;  Service: Orthopedics;  Laterality: Right;   HARDWARE REMOVAL Right 02/05/2021   Procedure: REMOVAL OF HARDWARE RIGHT ANKLE;  Surgeon: Nadara Mustard, MD;  Location[facility] OR;  Service: Orthopedics;  Laterality: Right;   VARICOSE VEIN SURGERY Left    left leg   VIDEO BRONCHOSCOPY WITH ENDOBRONCHIAL NAVIGATION Right 08/25/2020   Procedure: VIDEO BRONCHOSCOPY WITH ENDOBRONCHIAL NAVIGATION WITH FIDUCIAL PLACEMENT;  Surgeon: Josephine Igo, DO;  Location: [facility] OR;  Service: Pulmonary;  Laterality: Right;   Social History   Occupational History   Occupation: Violinist  Tobacco Use   Smoking status: Former    Current packs/day: 0.00    Average packs/day: 1 pack/day for 30.0 years (30.0 ttl pk-yrs)    Types: Cigarettes    Start date: 46    Quit date: 2005    Years since quitting: 19.6   Smokeless tobacco: Never  Vaping Use   Vaping status: Never Used  Substance and Sexual Activity   Alcohol use: No   Drug use: No   Sexual activity: Not Currently    Partners: Male    Birth control/protection: Post-menopausal

## 2023-07-06 ENCOUNTER — Ambulatory Visit (HOSPITAL_BASED_OUTPATIENT_CLINIC_OR_DEPARTMENT_OTHER): Payer: Medicare Other | Admitting: Student

## 2023-07-06 ENCOUNTER — Telehealth: Payer: Self-pay | Admitting: Orthopedic Surgery

## 2023-07-06 ENCOUNTER — Telehealth: Payer: Self-pay | Admitting: Family

## 2023-07-06 DIAGNOSIS — G5701 Lesion of sciatic nerve, right lower limb: Secondary | ICD-10-CM | POA: Diagnosis not present

## 2023-07-06 NOTE — Telephone Encounter (Signed)
I called and lm on vm to question if the pt is wearing a stiff soled shoe or post op shoe as directed by Dr. Lajoyce Corners at her visit on 06/30/2023. She heeds to offload pressure on the mid foot and prevent bending with a stiff soled shoe to allow this midfoot sprain to heal. Can call with questions.

## 2023-07-06 NOTE — Telephone Encounter (Signed)
Patient called saying that her right foot is in much pain. She broke it 2 years ago and can barely walk. She wants to know what to do? [phone]

## 2023-07-06 NOTE — Telephone Encounter (Signed)
Called pt and was unable to leave a voicemail. Pt asked to see Dr Lajoyce Corners or PA Denny Peon for ankle pain. Neither Erin nor Lajoyce Corners have anything. Please offer pt to be seen at same day clinic if she calls. No way we can open any slots for [place] or Erin.

## 2023-07-06 NOTE — Telephone Encounter (Signed)
I am sorry, we do not have any availability to open a spot at this time. Julie Mann is completely overbooked the days she is here since Dr Lajoyce Corners is out of the office next week.

## 2023-07-06 NOTE — Telephone Encounter (Signed)
Pt called requesting to see PA Denny Peon about her ankle pain sooner than 9/3. Please call pt if we are able to open a slot. Pt phone number is [phone]. Unable to be seen today

## 2023-07-11 DIAGNOSIS — M545 Low back pain, unspecified: Secondary | ICD-10-CM | POA: Diagnosis not present

## 2023-07-15 ENCOUNTER — Other Ambulatory Visit (HOSPITAL_COMMUNITY): Payer: Self-pay

## 2023-07-18 ENCOUNTER — Other Ambulatory Visit (HOSPITAL_COMMUNITY): Payer: Self-pay

## 2023-07-18 DIAGNOSIS — M545 Low back pain, unspecified: Secondary | ICD-10-CM | POA: Diagnosis not present

## 2023-07-18 MED ORDER — LORAZEPAM 1 MG PO TABS
0.5000 mg | ORAL_TABLET | ORAL | 0 refills | Status: AC | PRN
Start: 2023-07-18 — End: ?
  Filled 2023-07-18: qty 30, 30d supply, fill #0

## 2023-07-20 DIAGNOSIS — M545 Low back pain, unspecified: Secondary | ICD-10-CM | POA: Diagnosis not present

## 2023-07-24 DIAGNOSIS — M545 Low back pain, unspecified: Secondary | ICD-10-CM | POA: Diagnosis not present

## 2023-07-27 DIAGNOSIS — M545 Low back pain, unspecified: Secondary | ICD-10-CM | POA: Diagnosis not present

## 2023-07-31 DIAGNOSIS — M545 Low back pain, unspecified: Secondary | ICD-10-CM | POA: Diagnosis not present

## 2023-08-01 ENCOUNTER — Ambulatory Visit: Payer: Medicare Other | Admitting: Orthopedic Surgery

## 2023-08-01 DIAGNOSIS — M79671 Pain in right foot: Secondary | ICD-10-CM | POA: Diagnosis not present

## 2023-08-03 ENCOUNTER — Encounter (INDEPENDENT_AMBULATORY_CARE_PROVIDER_SITE_OTHER): Payer: Medicare Other | Admitting: Ophthalmology

## 2023-08-03 ENCOUNTER — Encounter: Payer: Self-pay | Admitting: Orthopedic Surgery

## 2023-08-03 NOTE — Progress Notes (Signed)
Office Visit Note   Patient: Julie Mann           Date of Birth: May 22, 1946           MRN: [id number] Visit Date: 08/01/2023              Requested by: Irven Coe, MD [address] [place],  [place] [address] PCP: Irven Coe, MD  Chief Complaint  Patient presents with   Right Foot - Follow-up      HPI: Patient is a 77 year old woman who presents in follow-up for right midfoot sprain.  Patient is currently wearing a stiff soled shoe.  Assessment & Plan: Visit Diagnoses:  1. Pain in right foot     Plan: Recommended continuing with a stiff soled shoe to protect the Lisfranc complex.  Follow-up in 6 weeks with three-view radiographs of the right foot.  Follow-Up Instructions: Return in about 6 weeks (around 09/12/2023).   Ortho Exam  Patient is alert, oriented, no adenopathy, well-dressed, normal affect, normal respiratory effort. Examination patient has a good dorsalis pedis pulse she has bruising across the midfoot and into her toes.  The Lisfranc joint is nontender to palpation and not tender to distraction.  She is showing improvement.  Imaging: No results found. No images are attached to the encounter.  Labs: Lab Results  Component Value Date   REPTSTATUS 02/10/2021 FINAL 02/05/2021   GRAMSTAIN  02/05/2021    MODERATE WBC PRESENT,BOTH PMN AND MONONUCLEAR NO ORGANISMS SEEN    CULT  02/05/2021    RARE STAPHYLOCOCCUS CAPITIS RARE STAPHYLOCOCCUS EPIDERMIDIS NO ANAEROBES ISOLATED Performed at [facility], [address], [place], [place] [address]    [facility] STAPHYLOCOCCUS CAPITIS 02/05/2021   [facility] STAPHYLOCOCCUS EPIDERMIDIS 02/05/2021     Lab Results  Component Value Date   ALBUMIN 3.9 06/15/2023   ALBUMIN 4.3 03/21/2023   ALBUMIN 4.2 12/08/2022    No results found for: "MG" No results found for: "VD25OH"  No results found for: "PREALBUMIN"    Latest Ref Rng & Units 06/15/2023   11:22 AM 03/27/2023    3:05 PM 03/21/2023     7:34 PM  CBC EXTENDED  WBC 4.0 - 10.5 K/uL 8.2  8.4  8.3   RBC 3.87 - 5.11 MIL/uL 4.32  4.53  4.58   Hemoglobin 12.0 - 15.0 g/dL 62.1  30.8  65.7   HCT 36.0 - 46.0 % 35.0  38.2  38.6   Platelets 150 - 400 K/uL 412  439  403   NEUT# 1.7 - 7.7 K/uL 6.6     Lymph# 0.7 - 4.0 K/uL 1.0        There is no height or weight on file to calculate BMI.  Orders:  No orders of the defined types were placed in this encounter.  No orders of the defined types were placed in this encounter.    Procedures: No procedures performed  Clinical Data: No additional findings.  ROS:  All other systems negative, except as noted in the HPI. Review of Systems  Objective: Vital Signs: LMP 11/14/1992   Specialty Comments:  EXAM: MRI LUMBAR SPINE WITHOUT CONTRAST   TECHNIQUE: Multiplanar, multisequence MR imaging of the lumbar spine was performed. No intravenous contrast was administered.   COMPARISON:  Lumbar spine MRI 05/30/2012   FINDINGS: Segmentation:  Standard.   Alignment: Slight lower lumbar levoscoliosis. No significant listhesis.   Vertebrae: No fracture or suspicious marrow lesion. Mild degenerative endplate edema at Q4-O9.  Conus medullaris and cauda equina: Conus extends to the T12 level and is suboptimally evaluated as it was not covered on axial images. Unremarkable appearance of the cauda equina.   Paraspinal and other soft tissues: Small renal cysts.   Disc levels:   Disc desiccation throughout the lumbar spine with exception of L3-4. Progressive, severe disc space narrowing at L5-S1.   L1-2: Negative.   L2-3: At most minimal disc bulging without stenosis.   L3-4: Minimal leftward disc bulging without stenosis.   L4-5: Disc bulging and moderate facet and ligamentum flavum hypertrophy, mildly progressed but without associated stenosis. Small synovial cysts posterior to the facet joints bilaterally, not in a position to cause neural impingement.   L5-S1:  Disc bulging, endplate spurring, disc space height loss, and mild facet hypertrophy without significant stenosis.   IMPRESSION: 1. Mild progression advanced disc degeneration at L5-S1 without stenosis. 2. Mildly progressive facet hypertrophy at L4-5 without stenosis.     Electronically Signed   By: Sebastian Ache M.D.   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/27/2021   Hardware complicating wound infection (HCC)    Neck pain 10/05/2020   Nodule of upper lobe of right lung 08/17/2020   Biceps tendonosis of left shoulder 02/13/2020   Impingement syndrome of left shoulder 02/13/2020   Post-operative state 02/13/2020   Ankle dislocation, right, initial encounter 03/10/2019   Trimalleolar fracture of ankle, closed, right, initial encounter    Chronic left shoulder pain 02/19/2019   Anxiety 12/17/2016   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  stairs (If Iam tired)   Dysrhythmia    PAF   Fall from horse 01/2018   Fracture, ankle 02/2019   Right    High cholesterol     History of stomach ulcers ~ 1966   Osteopenia     Family History  Problem Relation Age of Onset   Breast cancer Maternal Aunt    Breast cancer Maternal Grandmother    Heart disease Mother    Stroke Father    Rectal cancer Paternal Grandmother    Breast cancer Sister 78   Breast cancer Sister 62    Past Surgical History:  Procedure Laterality Date   ANUS SURGERY  2005   "biopsy"   BREAST CYST EXCISION Right 1966   DILATION AND CURETTAGE OF UTERUS  1980's   S/P miscarriage   EXTERNAL FIXATION LEG Right 03/10/2019   Procedure: OPEN REDUCTION INTERNAL FIXATION RIGHT ANKLE;  Surgeon: Teryl Lucy, MD;  Location: [facility] OR;  Service: Orthopedics;  Laterality: Right;   HARDWARE REMOVAL Right 02/05/2021   Procedure: REMOVAL OF HARDWARE RIGHT ANKLE;  Surgeon: Nadara Mustard, MD;  [facility];  Service: Orthopedics;  Laterality: Right;   VARICOSE VEIN SURGERY Left    left leg   VIDEO BRONCHOSCOPY WITH ENDOBRONCHIAL NAVIGATION Right 08/25/2020   Procedure: VIDEO BRONCHOSCOPY WITH ENDOBRONCHIAL NAVIGATION WITH FIDUCIAL PLACEMENT;  Surgeon: Josephine Igo, DO;  Location: [facility] OR;  Service: Pulmonary;  Laterality: Right;   Social History   Occupational History   Occupation: Violinist  Tobacco Use   Smoking status: Former    Current packs/day: 0.00    Average packs/day: 1 pack/day for 30.0 years (30.0 ttl pk-yrs)    Types: Cigarettes    Start date: 10    Quit date: 2005    Years since quitting: 19.7   Smokeless tobacco: Never  Vaping Use   Vaping status: Never Used  Substance and Sexual Activity   Alcohol use: No   Drug use: No   Sexual activity: Not Currently    Partners: Male    Birth control/protection: Post-menopausal

## 2023-08-07 DIAGNOSIS — M545 Low back pain, unspecified: Secondary | ICD-10-CM | POA: Diagnosis not present

## 2023-08-10 DIAGNOSIS — H04123 Dry eye syndrome of bilateral lacrimal glands: Secondary | ICD-10-CM | POA: Diagnosis not present

## 2023-08-10 DIAGNOSIS — H43813 Vitreous degeneration, bilateral: Secondary | ICD-10-CM | POA: Diagnosis not present

## 2023-08-10 DIAGNOSIS — M545 Low back pain, unspecified: Secondary | ICD-10-CM | POA: Diagnosis not present

## 2023-08-10 DIAGNOSIS — H43821 Vitreomacular adhesion, right eye: Secondary | ICD-10-CM | POA: Diagnosis not present

## 2023-08-10 DIAGNOSIS — H35371 Puckering of macula, right eye: Secondary | ICD-10-CM | POA: Diagnosis not present

## 2023-08-11 ENCOUNTER — Other Ambulatory Visit (HOSPITAL_COMMUNITY): Payer: Self-pay

## 2023-08-11 DIAGNOSIS — M79671 Pain in right foot: Secondary | ICD-10-CM | POA: Diagnosis not present

## 2023-08-13 ENCOUNTER — Other Ambulatory Visit (HOSPITAL_COMMUNITY): Payer: Self-pay

## 2023-08-14 ENCOUNTER — Other Ambulatory Visit: Payer: Self-pay | Admitting: Family Medicine

## 2023-08-14 ENCOUNTER — Other Ambulatory Visit (HOSPITAL_COMMUNITY): Payer: Self-pay

## 2023-08-15 ENCOUNTER — Other Ambulatory Visit (HOSPITAL_COMMUNITY): Payer: Self-pay

## 2023-08-15 MED ORDER — LORAZEPAM 1 MG PO TABS
0.5000 mg | ORAL_TABLET | ORAL | 0 refills | Status: DC | PRN
Start: 2023-08-15 — End: 2023-09-21
  Filled 2023-08-15 (×2): qty 30, 30d supply, fill #0

## 2023-08-16 ENCOUNTER — Ambulatory Visit: Payer: Medicare Other | Admitting: Orthopedic Surgery

## 2023-08-16 DIAGNOSIS — M7989 Other specified soft tissue disorders: Secondary | ICD-10-CM | POA: Diagnosis not present

## 2023-08-16 DIAGNOSIS — I83891 Varicose veins of right lower extremities with other complications: Secondary | ICD-10-CM | POA: Diagnosis not present

## 2023-08-16 DIAGNOSIS — I8312 Varicose veins of left lower extremity with inflammation: Secondary | ICD-10-CM | POA: Diagnosis not present

## 2023-08-16 DIAGNOSIS — R600 Localized edema: Secondary | ICD-10-CM | POA: Diagnosis not present

## 2023-08-16 DIAGNOSIS — I87393 Chronic venous hypertension (idiopathic) with other complications of bilateral lower extremity: Secondary | ICD-10-CM | POA: Diagnosis not present

## 2023-08-16 DIAGNOSIS — M79604 Pain in right leg: Secondary | ICD-10-CM | POA: Diagnosis not present

## 2023-08-17 ENCOUNTER — Inpatient Hospital Stay: Payer: Medicare Other | Attending: Internal Medicine

## 2023-08-17 ENCOUNTER — Telehealth: Payer: Self-pay | Admitting: Medical Oncology

## 2023-08-17 ENCOUNTER — Inpatient Hospital Stay: Payer: Medicare Other | Admitting: Internal Medicine

## 2023-08-17 VITALS — BP 170/58 | HR 68 | Temp 97.7°F | Resp 17 | Ht 67.0 in | Wt 124.6 lb

## 2023-08-17 DIAGNOSIS — M545 Low back pain, unspecified: Secondary | ICD-10-CM | POA: Diagnosis not present

## 2023-08-17 DIAGNOSIS — D75839 Thrombocytosis, unspecified: Secondary | ICD-10-CM

## 2023-08-17 DIAGNOSIS — D649 Anemia, unspecified: Secondary | ICD-10-CM | POA: Diagnosis not present

## 2023-08-17 DIAGNOSIS — E7800 Pure hypercholesterolemia, unspecified: Secondary | ICD-10-CM | POA: Insufficient documentation

## 2023-08-17 DIAGNOSIS — Z85048 Personal history of other malignant neoplasm of rectum, rectosigmoid junction, and anus: Secondary | ICD-10-CM | POA: Insufficient documentation

## 2023-08-17 DIAGNOSIS — I480 Paroxysmal atrial fibrillation: Secondary | ICD-10-CM | POA: Insufficient documentation

## 2023-08-17 DIAGNOSIS — Z79899 Other long term (current) drug therapy: Secondary | ICD-10-CM | POA: Insufficient documentation

## 2023-08-17 DIAGNOSIS — D539 Nutritional anemia, unspecified: Secondary | ICD-10-CM

## 2023-08-17 DIAGNOSIS — G5700 Lesion of sciatic nerve, unspecified lower limb: Secondary | ICD-10-CM | POA: Diagnosis not present

## 2023-08-17 DIAGNOSIS — F419 Anxiety disorder, unspecified: Secondary | ICD-10-CM | POA: Diagnosis not present

## 2023-08-17 DIAGNOSIS — M8580 Other specified disorders of bone density and structure, unspecified site: Secondary | ICD-10-CM | POA: Insufficient documentation

## 2023-08-17 DIAGNOSIS — Z885 Allergy status to narcotic agent status: Secondary | ICD-10-CM | POA: Insufficient documentation

## 2023-08-17 DIAGNOSIS — Z888 Allergy status to other drugs, medicaments and biological substances status: Secondary | ICD-10-CM | POA: Insufficient documentation

## 2023-08-17 DIAGNOSIS — Z881 Allergy status to other antibiotic agents status: Secondary | ICD-10-CM | POA: Insufficient documentation

## 2023-08-17 LAB — IRON AND IRON BINDING CAPACITY (CC-WL,HP ONLY)
Iron: 41 ug/dL (ref 28–170)
Saturation Ratios: 11 % (ref 10.4–31.8)
TIBC: 384 ug/dL (ref 250–450)
UIBC: 343 ug/dL (ref 148–442)

## 2023-08-17 LAB — CBC WITH DIFFERENTIAL (CANCER CENTER ONLY)
Abs Immature Granulocytes: 0.02 10*3/uL (ref 0.00–0.07)
Basophils Absolute: 0.1 10*3/uL (ref 0.0–0.1)
Basophils Relative: 1 %
Eosinophils Absolute: 0.2 10*3/uL (ref 0.0–0.5)
Eosinophils Relative: 3 %
HCT: 35.3 % — ABNORMAL LOW (ref 36.0–46.0)
Hemoglobin: 11 g/dL — ABNORMAL LOW (ref 12.0–15.0)
Immature Granulocytes: 0 %
Lymphocytes Relative: 13 %
Lymphs Abs: 1.1 10*3/uL (ref 0.7–4.0)
MCH: 25.6 pg — ABNORMAL LOW (ref 26.0–34.0)
MCHC: 31.2 g/dL (ref 30.0–36.0)
MCV: 82.1 fL (ref 80.0–100.0)
Monocytes Absolute: 0.6 10*3/uL (ref 0.1–1.0)
Monocytes Relative: 7 %
Neutro Abs: 6.3 10*3/uL (ref 1.7–7.7)
Neutrophils Relative %: 76 %
Platelet Count: 417 10*3/uL — ABNORMAL HIGH (ref 150–400)
RBC: 4.3 MIL/uL (ref 3.87–5.11)
RDW: 13.6 % (ref 11.5–15.5)
WBC Count: 8.3 10*3/uL (ref 4.0–10.5)
nRBC: 0 % (ref 0.0–0.2)

## 2023-08-17 LAB — FERRITIN: Ferritin: 7 ng/mL — ABNORMAL LOW (ref 11–307)

## 2023-08-17 NOTE — Telephone Encounter (Signed)
LVM with Dr Asa Lente instructions.

## 2023-08-17 NOTE — Progress Notes (Signed)
[facility] [phone]   [phone]  OFFICE PROGRESS NOTE  Irven Coe, MD 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 The patient also has mild anemia.  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 female returns to the clinic today for follow-up visit accompanied by her husband Annette Stable. Discussed the use of AI scribe software for clinical note transcription with the patient, who gave verbal consent to proceed.  History of Present Illness   The patient, a 77 year old individual with a history of elevated platelet count and mild anemia, presented for a follow-up visit. She reported multiple musculoskeletal complaints, including a problematic right foot and a previously broken right wrist. She also mentioned having piriformis syndrome. 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  The patient had been prescribed vitamin B12 supplements due to a deficiency detected in previous blood work. However, she discontinued the supplements after two days due to adverse effects. She reported taking a daily multivitamin that contains vitamin B12.  A molecular study, the JAK2 mutation panel, was conducted to investigate the cause of the elevated platelet count. The results revealed a mutation of unknown clinical significance. The patient's recent blood work showed similar results to previous tests, with a hemoglobin level of 11, hematocrit of 35.3, and platelet count of 417,000.       MEDICAL HISTORY: Past Medical History:  Diagnosis Date   Anal cancer (HCC) 2005   SCCa anus-stage  II chemo, radiation   Anxiety    Arthritis    "hands, fingers, back" (02/27/2014)   Atrial fibrillation (HCC)    Cat scratch of right hand 02/24/2014   COPD (chronic obstructive pulmonary disease) (HCC)    Dyspnea    walking up hill;.  stairs (If Iam tired)   Dysrhythmia    PAF   Fall from horse 01/2018   Fracture, ankle 02/2019   Right    High cholesterol    History of stomach ulcers ~ 1966   Osteopenia     ALLERGIES:  is allergic to duloxetine, eliquis [apixaban], ivp dye [iodinated contrast media], oxycodone, tiotropium bromide monohydrate, conjugated estrogens, doxycycline, erythromycin, flonase [fluticasone propionate], hydrocodone, keflex [cephalexin], other, paxil [paroxetine hcl], prednisone, provera [medroxyprogesterone acetate], xarelto [rivaroxaban], zithromax [azithromycin], and zoloft [sertraline hcl].  MEDICATIONS:  Current Outpatient Medications  Medication Sig Dispense Refill   APPLE CIDER VINEGAR PO Take 5 mLs by mouth daily as needed (Constipation).     aspirin EC 81 MG tablet Take 1 tablet (81 mg total) by mouth daily. 90 tablet 3   atorvastatin (LIPITOR) 10 MG tablet Take 1 tablet (10 mg total) by mouth 2 (two) times a week on monday and friday 26 tablet 3   cyanocobalamin (VITAMIN B12) 1000 MCG tablet Take 1 tablet (1,000 mcg total) by mouth daily. 30 tablet 2   HYDROcodone-acetaminophen (NORCO) 7.5-325 MG tablet Take 1 tablet by mouth every 8 (eight) hours as needed for 10 days 30 tablet 0   HYDROcodone-acetaminophen (NORCO/VICODIN) 5-325 MG tablet Take 1 tablet by mouth every 4 (four) hours to 6 (six) hours as needed for pain. 14 tablet 0   ibuprofen (ADVIL) 600  MG tablet Take 1 tablet (600 mg total) by mouth daily as needed. 30 tablet 1   influenza vaccine adjuvanted (FLUAD QUADRIVALENT) 0.5 ML injection Inject 0.5 mLs into the muscle. 0.5 mL 0   LORazepam (ATIVAN) 1 MG tablet Take 1 tablet (1 mg total) by mouth at bedtime as needed for anxiety or sleep. 30  tablet 2   LORazepam (ATIVAN) 1 MG tablet Take 1/2-1 tablet (0.5-1mg ) by mouth  as needed. 30 tablet 0   LORazepam (ATIVAN) 1 MG tablet Take 1/2-1 tablet (0.5-1 mg total) by mouth as needed. 30 tablet 0   LORazepam (ATIVAN) 1 MG tablet Take 1/2-1 tablet (0.5-1 mg total) by mouth as needed. 30 tablet 0   metoprolol succinate (TOPROL XL) 25 MG 24 hr tablet Take 1 tablet (25 mg total) by mouth daily. 90 tablet 3   metoprolol tartrate (LOPRESSOR) 25 MG tablet Take 25 mg by mouth daily as needed (palpitations/AFIB).      Multiple Vitamins-Minerals (MULTIVITAMIN PO) Take 1 tablet by mouth daily. One a day     olmesartan (BENICAR) 20 MG tablet Take 0.5 tablets (10 mg total) by mouth daily. (Patient not taking: Reported on 03/24/2023) 30 tablet 1   OVER THE COUNTER MEDICATION Take 5 mLs by mouth daily as needed (Constipation). Olive oil     penicillin v potassium (VEETID) 500 MG tablet Take 1 tablet (500 mg total) by mouth 4 (four) times daily until complete. 28 tablet 0   Propylene Glycol-Glycerin 0.6-0.6 % SOLN Place 1 drop into both eyes 2 (two) times daily. Soothe Dry eyes     traMADol (ULTRAM) 50 MG tablet Take 1 tablet (50 mg total) by mouth every 12 (twelve) hours as needed. 30 tablet 0   triamcinolone cream (KENALOG) 0.1 % Apply topically to affected areas up to twice daily as needed. Do not apply to face, groin, or underarm 454 g 3   No current facility-administered medications for this visit.    SURGICAL HISTORY:  Past Surgical History:  Procedure Laterality Date   ANUS SURGERY  2005   "biopsy"   BREAST CYST EXCISION Right 1966   DILATION AND CURETTAGE OF UTERUS  1980's   S/P miscarriage   EXTERNAL FIXATION LEG Right 03/10/2019   Procedure: OPEN REDUCTION INTERNAL FIXATION RIGHT ANKLE;  Surgeon: Teryl Lucy, MD;  Location: [facility] OR;  Service: Orthopedics;  Laterality: Right;   HARDWARE REMOVAL Right 02/05/2021   Procedure: REMOVAL OF HARDWARE RIGHT ANKLE;  Surgeon: Nadara Mustard, MD;   Location: [facility] OR;  Service: Orthopedics;  Laterality: Right;   VARICOSE VEIN SURGERY Left    left leg   VIDEO BRONCHOSCOPY WITH ENDOBRONCHIAL NAVIGATION Right 08/25/2020   Procedure: VIDEO BRONCHOSCOPY WITH ENDOBRONCHIAL NAVIGATION WITH FIDUCIAL PLACEMENT;  Surgeon: Josephine Igo, DO;  Location: [facility] OR;  Service: Pulmonary;  Laterality: Right;    REVIEW OF SYSTEMS:  A comprehensive review of systems was negative except for: Constitutional: positive for fatigue Musculoskeletal: positive for arthralgias   PHYSICAL EXAMINATION: General appearance: alert, cooperative, fatigued, and no distress Head: Normocephalic, without obvious abnormality, atraumatic Neck: no adenopathy, no JVD, supple, symmetrical, trachea midline, and thyroid not enlarged, symmetric, no tenderness/mass/nodules Lymph nodes: Cervical, supraclavicular, and axillary nodes normal. Resp: clear to auscultation bilaterally Back: symmetric, no curvature. ROM normal. No CVA tenderness. Cardio: regular rate and rhythm, S1, S2 normal, no murmur, click, rub or gallop GI: soft, non-tender; bowel sounds normal; no masses,  no organomegaly Extremities: extremities normal, atraumatic, no cyanosis or edema  ECOG PERFORMANCE STATUS: 1 - Symptomatic but completely ambulatory  Blood pressure (!) 170/58, pulse 68, temperature 97.7 F (36.5 C), resp. rate 17, height 5\' 7"  (1.702 m), weight 124 lb 9.6 oz (56.5 kg), last menstrual period 11/14/1992, SpO2 100%.  LABORATORY DATA: Lab Results  Component Value Date   WBC 8.3 08/17/2023   HGB 11.0 (L) 08/17/2023   HCT 35.3 (L) 08/17/2023   MCV 82.1 08/17/2023   PLT 417 (H) 08/17/2023      Chemistry      Component Value Date/Time   NA 135 06/15/2023 1122   NA 138 12/08/2022 1038   K 4.3 06/15/2023 1122   CL 103 06/15/2023 1122   CO2 25 06/15/2023 1122   BUN 9 06/15/2023 1122   BUN 16 12/08/2022 1038   CREATININE 0.93 06/15/2023 1122      Component Value Date/Time   CALCIUM 9.2  06/15/2023 1122   ALKPHOS 54 06/15/2023 1122   AST 16 06/15/2023 1122   ALT 8 06/15/2023 1122   BILITOT 0.4 06/15/2023 1122       RADIOGRAPHIC STUDIES: No results found.  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  -Monitor closely due to the presence of the mutation. -Repeat blood work and follow-up in 6 months.  Mild Anemia Likely secondary to low Vitamin B12. Patient unable to tolerate B12 supplements but reports taking a multivitamin containing B12 daily. -Continue daily multivitamin containing B12.  Musculoskeletal Complaints Reports multiple issues including a problematic right foot, a previously broken right wrist, and piriformis syndrome. -Continue current management strategies including physical therapy.  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   The patient was advised to call immediately if she has any concerning symptoms in the interval. The patient voices understanding of current disease status and treatment options and is in agreement with the current care plan.  All questions were answered. The patient knows to call the clinic with any problems, questions or concerns. We can certainly see the patient much sooner if necessary.  The total time spent in the appointment was 20 minutes.  Disclaimer: This note was dictated with voice recognition software. Similar sounding words can inadvertently be transcribed and may not be corrected upon review.

## 2023-08-17 NOTE — Telephone Encounter (Signed)
-----   Message from Lajuana Matte sent at 08/17/2023  4:36 PM EDT ----- Please let her know that she will need to take over-the-counter iron supplement with orange juice every other day. ----- Message ----- From: Leory Plowman, Lab In [place] Sent: 08/17/2023   9:08 AM EDT To: Si Gaul, MD

## 2023-08-21 DIAGNOSIS — M545 Low back pain, unspecified: Secondary | ICD-10-CM | POA: Diagnosis not present

## 2023-08-23 ENCOUNTER — Other Ambulatory Visit (HOSPITAL_COMMUNITY): Payer: Self-pay

## 2023-08-24 ENCOUNTER — Other Ambulatory Visit (HOSPITAL_COMMUNITY): Payer: Self-pay

## 2023-08-24 ENCOUNTER — Other Ambulatory Visit (INDEPENDENT_AMBULATORY_CARE_PROVIDER_SITE_OTHER): Payer: Medicare Other

## 2023-08-24 ENCOUNTER — Ambulatory Visit: Payer: Medicare Other | Admitting: Orthopedic Surgery

## 2023-08-24 DIAGNOSIS — M79671 Pain in right foot: Secondary | ICD-10-CM

## 2023-08-24 MED ORDER — IBUPROFEN 600 MG PO TABS
600.0000 mg | ORAL_TABLET | ORAL | 1 refills | Status: DC | PRN
Start: 2023-08-24 — End: 2023-10-24
  Filled 2023-08-24: qty 30, 30d supply, fill #0
  Filled 2023-09-13 – 2023-09-21 (×3): qty 30, 30d supply, fill #1

## 2023-08-29 ENCOUNTER — Ambulatory Visit: Payer: Medicare Other | Admitting: Cardiology

## 2023-08-30 ENCOUNTER — Encounter: Payer: Self-pay | Admitting: Orthopedic Surgery

## 2023-08-30 DIAGNOSIS — M545 Low back pain, unspecified: Secondary | ICD-10-CM | POA: Diagnosis not present

## 2023-08-30 NOTE — Progress Notes (Signed)
Office Visit Note   Patient: Julie Mann           Date of Birth: September 10, 1946           MRN: [id number] Visit Date: 08/24/2023              Requested by: Irven Coe, MD [address] [place],  [place] [address] PCP: Irven Coe, MD  Chief Complaint  Patient presents with   Right Foot - Follow-up      HPI: Patient is a 77 year old woman who is seen in follow-up for Lisfranc injury right foot.  Patient has been ambulating in a fracture boot.  Patient is still symptomatic.  Assessment & Plan: Visit Diagnoses:  1. Pain in right foot     Plan: Recommended compression and increase activities as tolerated.  Follow-Up Instructions: No follow-ups on file.   Ortho Exam  Patient is alert, oriented, no adenopathy, well-dressed, normal affect, normal respiratory effort. Examination patient states her foot feels tight and stiff.  There is decreased swelling there is no tenderness to palpation across the Lisfranc complex there is no tenderness with attempted distraction across the Lisfranc complex.  Radiograph shows no widening across the Lisfranc joint.  Patient does have mild venous insufficiency without ulceration.  Imaging: No results found. No images are attached to the encounter.  Labs: Lab Results  Component Value Date   REPTSTATUS 02/10/2021 FINAL 02/05/2021   GRAMSTAIN  02/05/2021    MODERATE WBC PRESENT,BOTH PMN AND MONONUCLEAR NO ORGANISMS SEEN    CULT  02/05/2021    RARE STAPHYLOCOCCUS CAPITIS RARE STAPHYLOCOCCUS EPIDERMIDIS NO ANAEROBES ISOLATED Performed at [facility], [address] [place], [place] [address]    [facility] STAPHYLOCOCCUS CAPITIS 02/05/2021   LABORGA STAPHYLOCOCCUS EPIDERMIDIS 02/05/2021     Lab Results  Component Value Date   ALBUMIN 3.9 06/15/2023   ALBUMIN 4.3 03/21/2023   ALBUMIN 4.2 12/08/2022    No results found for: "MG" No results found for: "VD25OH"  No results found for: "PREALBUMIN"    Latest Ref  Rng & Units 08/17/2023    8:58 AM 06/15/2023   11:22 AM 03/27/2023    3:05 PM  CBC EXTENDED  WBC 4.0 - 10.5 K/uL 8.3  8.2  8.4   RBC 3.87 - 5.11 MIL/uL 4.30  4.32  4.53   Hemoglobin 12.0 - 15.0 g/dL 29.5  28.4  13.2   HCT 36.0 - 46.0 % 35.3  35.0  38.2   Platelets 150 - 400 K/uL 417  412  439   NEUT# 1.7 - 7.7 K/uL 6.3  6.6    Lymph# 0.7 - 4.0 K/uL 1.1  1.0       There is no height or weight on file to calculate BMI.  Orders:  Orders Placed This Encounter  Procedures   XR Foot Complete Right   No orders of the defined types were placed in this encounter.    Procedures: No procedures performed  Clinical Data: No additional findings.  ROS:  All other systems negative, except as noted in the HPI. Review of Systems  Objective: Vital Signs: LMP 11/14/1992   Specialty Comments:  EXAM: MRI LUMBAR SPINE WITHOUT CONTRAST   TECHNIQUE: Multiplanar, multisequence MR imaging of the lumbar spine was performed. No intravenous contrast was administered.   COMPARISON:  Lumbar spine MRI 05/30/2012   FINDINGS: Segmentation:  Standard.   Alignment: Slight lower lumbar levoscoliosis. No significant listhesis.   Vertebrae: No fracture or suspicious  marrow lesion. Mild degenerative endplate edema at W0-J8.   Conus medullaris and cauda equina: Conus extends to the T12 level and is suboptimally evaluated as it was not covered on axial images. Unremarkable appearance of the cauda equina.   Paraspinal and other soft tissues: Small renal cysts.   Disc levels:   Disc desiccation throughout the lumbar spine with exception of L3-4. Progressive, severe disc space narrowing at L5-S1.   L1-2: Negative.   L2-3: At most minimal disc bulging without stenosis.   L3-4: Minimal leftward disc bulging without stenosis.   L4-5: Disc bulging and moderate facet and ligamentum flavum hypertrophy, mildly progressed but without associated stenosis. Small synovial cysts posterior to the facet  joints bilaterally, not in a position to cause neural impingement.   L5-S1: Disc bulging, endplate spurring, disc space height loss, and mild facet hypertrophy without significant stenosis.   IMPRESSION: 1. Mild progression advanced disc degeneration at L5-S1 without stenosis. 2. Mildly progressive facet hypertrophy at L4-5 without stenosis.     Electronically Signed   By: Sebastian Ache M.D.   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/27/2021   Hardware complicating wound infection (HCC)    Neck pain 10/05/2020   Nodule of upper lobe of right lung 08/17/2020   Biceps tendonosis of left shoulder 02/13/2020   Impingement syndrome of left shoulder 02/13/2020   Post-operative state 02/13/2020   Ankle dislocation, right, initial encounter 03/10/2019   Trimalleolar fracture of ankle, closed, right, initial encounter    Chronic left shoulder pain 02/19/2019   Anxiety 12/17/2016   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  stairs (If Iam tired)   Dysrhythmia    PAF   Fall from  horse 01/2018   Fracture, ankle 02/2019   Right    High cholesterol    History of stomach ulcers ~ 1966   Osteopenia     Family History  Problem Relation Age of Onset   Breast cancer Maternal Aunt    Breast cancer Maternal Grandmother    Heart disease Mother    Stroke Father    Rectal cancer Paternal Grandmother    Breast cancer Sister 71   Breast cancer Sister 42    Past Surgical History:  Procedure Laterality Date   ANUS SURGERY  2005   "biopsy"   BREAST CYST EXCISION Right 1966   DILATION AND CURETTAGE OF UTERUS  1980's   S/P miscarriage   EXTERNAL FIXATION LEG Right 03/10/2019   Procedure: OPEN REDUCTION INTERNAL FIXATION RIGHT ANKLE;  Surgeon: Dion Saucier,  Ivin Booty, MD;  Location: [facility] OR;  Service: Orthopedics;  Laterality: Right;   HARDWARE REMOVAL Right 02/05/2021   Procedure: REMOVAL OF HARDWARE RIGHT ANKLE;  Surgeon: Nadara Mustard, MD;  Location: [facility] OR;  Service: Orthopedics;  Laterality: Right;   VARICOSE VEIN SURGERY Left    left leg   VIDEO BRONCHOSCOPY WITH ENDOBRONCHIAL NAVIGATION Right 08/25/2020   Procedure: VIDEO BRONCHOSCOPY WITH ENDOBRONCHIAL NAVIGATION WITH FIDUCIAL PLACEMENT;  Surgeon: Josephine Igo, DO;  Location: [facility] OR;  Service: Pulmonary;  Laterality: Right;   Social History   Occupational History   Occupation: Violinist  Tobacco Use   Smoking status: Former    Current packs/day: 0.00    Average packs/day: 1 pack/day for 30.0 years (30.0 ttl pk-yrs)    Types: Cigarettes    Start date: 58    Quit date: 2005    Years since quitting: 19.8   Smokeless tobacco: Never  Vaping Use   Vaping status: Never Used  Substance and Sexual Activity   Alcohol use: No   Drug use: No   Sexual activity: Not Currently    Partners: Male    Birth control/protection: Post-menopausal

## 2023-09-04 ENCOUNTER — Ambulatory Visit: Payer: Medicare Other | Admitting: Podiatry

## 2023-09-04 ENCOUNTER — Ambulatory Visit (INDEPENDENT_AMBULATORY_CARE_PROVIDER_SITE_OTHER): Payer: Medicare Other

## 2023-09-04 ENCOUNTER — Ambulatory Visit: Payer: Medicare Other

## 2023-09-04 DIAGNOSIS — M216X9 Other acquired deformities of unspecified foot: Secondary | ICD-10-CM | POA: Diagnosis not present

## 2023-09-04 DIAGNOSIS — M778 Other enthesopathies, not elsewhere classified: Secondary | ICD-10-CM

## 2023-09-04 DIAGNOSIS — M7751 Other enthesopathy of right foot: Secondary | ICD-10-CM

## 2023-09-04 DIAGNOSIS — M19071 Primary osteoarthritis, right ankle and foot: Secondary | ICD-10-CM | POA: Diagnosis not present

## 2023-09-04 NOTE — Patient Instructions (Signed)

## 2023-09-04 NOTE — Progress Notes (Signed)
Subjective:  Patient ID: Julie Mann, female    DOB: 1946/03/21,  MRN: [id number]  No chief complaint on file.   Discussed the use of AI scribe software for clinical note transcription with the patient, who gave verbal consent to proceed.  History of Present Illness          77 year old female, with a history of ankle surgery, presents with ongoing ankle pain and discomfort. The pain has been persistent since a fall in July, which occurred when the patient tripped over items on the floor. The patient reports that the pain is primarily located in the ankle. The patient has been wearing compression socks, but noticed that the bruising seemed to lessen when he stopped wearing them a few days ago. The patient also mentions a previous severe ankle injury that required surgery, and she subsequently had some of the hardware removed.    Objective:    Physical Exam          General: AAO x3, NAD  Dermatological: Skin is warm, dry and supple bilateral. There are no open sores, no preulcerative lesions, no rash or signs of infection present.  Vascular: Dorsalis Pedis artery and Posterior Tibial artery pedal pulses are 2/4 bilateral with immedate capillary fill time. There is no pain with calf compression, swelling, warmth, erythema.  She does have some varicosities.  Neruologic: Grossly intact via light touch bilateral.   Musculoskeletal: There is tenderness to palpation on the anterior ankle joint line most of the anteromedial aspect of the ankle gutter.  There is some mild chronic appearing edema.  There is no erythema or warmth.  She does get discomfort in joint range of motion.  The hardware is not palpable.  No other areas of discomfort. Cavus foot type.   Gait: Unassisted, Nonantalgic.   No images are attached to the encounter.    Results          Assessment:   1. Capsulitis of foot   2. Capsulitis of ankle, right      Plan:  Patient was evaluated and treated and all  questions answered.  Assessment and Plan          Post-traumatic Ankle Arthritis Chronic ankle pain following a severe ankle fracture with surgical intervention.  X-ray shows narrowing of the joint and presence of screws from previous surgery. -Administered steroid injection into the ankle joint to reduce inflammation and pain.  She was to proceed with this and verbal consent obtained.  I cleaned the skin with Betadine, alcohol.  A mixture of 1 cc of Kenalog 10, 0.5 cc of Marcaine plain, 0.5 cc of lidocaine plain was infiltrated into the anterior medial aspect ankle joint any complications.  Postinjection care discussed.  Tolerated well. -Plan to reassess in one month.  Varicose Veins Patient reports new onset of varicose veins. Patient has an appointment with a vein specialist for possible treatment. -She was to hold off on this.  She has not called to reschedule.   Radiology: X-rays of the foot, ankle were obtained.  Multiple views were obtained.  No evidence of acute fracture.  Joint space narrowing along the ankle joint as well as arthritic changes present Lisfranc joint.  Hardware intact along the medial malleolus.   Return in about 4 weeks (around 10/02/2023).   Vivi Barrack DPM

## 2023-09-06 ENCOUNTER — Telehealth: Payer: Self-pay

## 2023-09-06 ENCOUNTER — Ambulatory Visit: Payer: Medicare Other | Admitting: Podiatry

## 2023-09-06 DIAGNOSIS — M545 Low back pain, unspecified: Secondary | ICD-10-CM | POA: Diagnosis not present

## 2023-09-06 NOTE — Telephone Encounter (Signed)
Patient came by the office today. Front desk asked me to "triage" her. She was in the office on Monday and you gave her an injections in her ankle. She says that it's still very sore and was worried that she could be allergic to the injection. Upon examination, a very small bruise was noted at the injection site. No swelling, redness or heat were noted around the site.  Advised patient that she not likely allergic, that she would have had more of a reaction. Advised to ice, elevated and rest that ankle. Also advised she could take ibuprofen or tylenol, if needed for pain. Reassured that soreness is a common complaint after injections. She seemed relieved. Advised to please call if she has any other questions. Thanks

## 2023-09-11 ENCOUNTER — Telehealth: Payer: Self-pay | Admitting: Podiatry

## 2023-09-11 ENCOUNTER — Other Ambulatory Visit: Payer: Self-pay | Admitting: Podiatry

## 2023-09-11 DIAGNOSIS — M19071 Primary osteoarthritis, right ankle and foot: Secondary | ICD-10-CM

## 2023-09-11 NOTE — Telephone Encounter (Signed)
Pt called 3 times one Friday 10/25 after office closed at 736pm and 2 on Saturday 10/26 stating her foot is still sore and she was not sure what she needed to do. I returned call today and she is asking what could help her with the pain/soreness.Please advise.

## 2023-09-12 ENCOUNTER — Ambulatory Visit: Payer: Medicare Other | Admitting: Orthopedic Surgery

## 2023-09-12 ENCOUNTER — Encounter: Payer: Self-pay | Admitting: Orthopedic Surgery

## 2023-09-12 DIAGNOSIS — M12571 Traumatic arthropathy, right ankle and foot: Secondary | ICD-10-CM | POA: Diagnosis not present

## 2023-09-12 DIAGNOSIS — M545 Low back pain, unspecified: Secondary | ICD-10-CM | POA: Diagnosis not present

## 2023-09-12 NOTE — Progress Notes (Signed)
Office Visit Note   Patient: Julie Mann           Date of Birth: 1946/09/08           MRN: [id number] Visit Date: 09/12/2023              Requested by: Irven Coe, MD [address] [place],  [place] [address] PCP: Irven Coe, MD  Chief Complaint  Patient presents with   Right Foot - Follow-up      HPI: Patient is a 77 year old woman who was seen in follow-up for the right foot and ankle.  Patient most recently had a Lisfranc injury across her midfoot.  This resolved with immobilization in a fracture boot.  Patient states that now she has resumed regular shoewear she has been having increasing anterior ankle pain.  She does have traumatic arthritis status post open reduction internal fixation for a trimalleolar ankle fracture.  Assessment & Plan: Visit Diagnoses:  1. Traumatic arthritis of right ankle     Plan: Discussed treatment options for the traumatic arthritis of her ankle.  Discussed repeat injection versus arthroscopic debridement.  Patient states that she has had increased pain after her last injection of the ankle and would like to proceed with arthroscopic debridement.  Will set this up as an outpatient.  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  Follow-Up Instructions: No follow-ups on file.   Ortho Exam  Patient is alert, oriented, no adenopathy, well-dressed, normal affect, normal respiratory effort. Examination patient has congruent range of motion of the right ankle there is venous swelling the right lower extremity.  There is no cellulitis.  She has a palpable dorsalis pedis pulse.  Patient is tender to palpation over the medial lateral gutters of the ankle as well as anteriorly.  Imaging: No results found. No images are attached to the encounter.  Labs: Lab Results  Component Value Date   REPTSTATUS 02/10/2021 FINAL 02/05/2021   GRAMSTAIN  02/05/2021    MODERATE WBC PRESENT,BOTH  PMN AND MONONUCLEAR NO ORGANISMS SEEN    CULT  02/05/2021    RARE STAPHYLOCOCCUS CAPITIS RARE STAPHYLOCOCCUS EPIDERMIDIS NO ANAEROBES ISOLATED Performed at [facility], [address] [place], [place] [address]    [facility] STAPHYLOCOCCUS CAPITIS 02/05/2021   LABORGA STAPHYLOCOCCUS EPIDERMIDIS 02/05/2021     Lab Results  Component Value Date   ALBUMIN 3.9 06/15/2023   ALBUMIN 4.3 03/21/2023   ALBUMIN 4.2 12/08/2022    No results found for: "MG" No results found for: "VD25OH"  No results found for: "PREALBUMIN"    Latest Ref Rng & Units 08/17/2023    8:58 AM 06/15/2023   11:22 AM 03/27/2023    3:05 PM  CBC EXTENDED  WBC 4.0 - 10.5 K/uL 8.3  8.2  8.4   RBC 3.87 - 5.11 MIL/uL 4.30  4.32  4.53   Hemoglobin 12.0 - 15.0 g/dL 63.8  75.6  43.3   HCT 36.0 - 46.0 % 35.3  35.0  38.2   Platelets 150 - 400 K/uL 417  412  439   NEUT# 1.7 - 7.7 K/uL 6.3  6.6    Lymph# 0.7 - 4.0 K/uL 1.1  1.0       There is no height or weight on file to calculate BMI.  Orders:  No orders of the defined types were placed in this encounter.  No orders of the defined types were placed in this  encounter.    Procedures: No procedures performed  Clinical Data: No additional findings.  ROS:  All other systems negative, except as noted in the HPI. Review of Systems  Objective: Vital Signs: LMP 11/14/1992   Specialty Comments:  EXAM: MRI LUMBAR SPINE WITHOUT CONTRAST   TECHNIQUE: Multiplanar, multisequence MR imaging of the lumbar spine was performed. No intravenous contrast was administered.   COMPARISON:  Lumbar spine MRI 05/30/2012   FINDINGS: Segmentation:  Standard.   Alignment: Slight lower lumbar levoscoliosis. No significant listhesis.   Vertebrae: No fracture or suspicious marrow lesion. Mild degenerative endplate edema at X5-M8.   Conus medullaris and cauda equina: Conus extends to the T12 level and is suboptimally evaluated as it was not covered on axial  images. Unremarkable appearance of the cauda equina.   Paraspinal and other soft tissues: Small renal cysts.   Disc levels:   Disc desiccation throughout the lumbar spine with exception of L3-4. Progressive, severe disc space narrowing at L5-S1.   L1-2: Negative.   L2-3: At most minimal disc bulging without stenosis.   L3-4: Minimal leftward disc bulging without stenosis.   L4-5: Disc bulging and moderate facet and ligamentum flavum hypertrophy, mildly progressed but without associated stenosis. Small synovial cysts posterior to the facet joints bilaterally, not in a position to cause neural impingement.   L5-S1: Disc bulging, endplate spurring, disc space height loss, and mild facet hypertrophy without significant stenosis.   IMPRESSION: 1. Mild progression advanced disc degeneration at L5-S1 without stenosis. 2. Mildly progressive facet hypertrophy at L4-5 without stenosis.     Electronically Signed   By: Sebastian Ache M.D.   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/27/2021   Hardware complicating wound infection (HCC)    Neck pain 10/05/2020   Nodule of upper lobe of right lung 08/17/2020   Biceps tendonosis of left shoulder 02/13/2020   Impingement syndrome of left shoulder 02/13/2020   Post-operative state 02/13/2020   Ankle dislocation, right, initial encounter 03/10/2019   Trimalleolar fracture of ankle, closed, right, initial encounter    Chronic left shoulder pain 02/19/2019   Anxiety 12/17/2016   Major depression in  remission (HCC) 12/17/2016   Osteopenia 12/17/2016   Paroxysmal atrial fibrillation (HCC) 12/17/2016   Pure hypercholesterolemia 12/17/2016   Rectal cancer (HCC) 07/30/2015   Past Medical History:  Diagnosis Date   Anal cancer (HCC) 2005   SCCa anus-stage II chemo, radiation   Anxiety    Arthritis    "hands, fingers, back" (02/27/2014)   Atrial fibrillation (HCC)    Cat scratch of right hand 02/24/2014   COPD (chronic obstructive pulmonary disease) (HCC)    Dyspnea    walking up hill;.  stairs (If Iam tired)   Dysrhythmia    PAF   Fall from horse 01/2018   Fracture, ankle 02/2019   Right    High cholesterol    History of stomach ulcers ~ 1966   Osteopenia     Family History  Problem Relation Age of Onset   Breast cancer Maternal Aunt  Breast cancer Maternal Grandmother    Heart disease Mother    Stroke Father    Rectal cancer Paternal Grandmother    Breast cancer Sister 40   Breast cancer Sister 16    Past Surgical History:  Procedure Laterality Date   ANUS SURGERY  2005   "biopsy"   BREAST CYST EXCISION Right 1966   DILATION AND CURETTAGE OF UTERUS  1980's   S/P miscarriage   EXTERNAL FIXATION LEG Right 03/10/2019   Procedure: OPEN REDUCTION INTERNAL FIXATION RIGHT ANKLE;  Surgeon: Teryl Lucy, MD;  Location: [facility] OR;  Service: Orthopedics;  Laterality: Right;   HARDWARE REMOVAL Right 02/05/2021   Procedure: REMOVAL OF HARDWARE RIGHT ANKLE;  Surgeon: Nadara Mustard, MD;  Location: [facility] OR;  Service: Orthopedics;  Laterality: Right;   VARICOSE VEIN SURGERY Left    left leg   VIDEO BRONCHOSCOPY WITH ENDOBRONCHIAL NAVIGATION Right 08/25/2020   Procedure: VIDEO BRONCHOSCOPY WITH ENDOBRONCHIAL NAVIGATION WITH FIDUCIAL PLACEMENT;  Surgeon: Josephine Igo, DO;  Location: [facility] OR;  Service: Pulmonary;  Laterality: Right;   Social History   Occupational History   Occupation: Violinist  Tobacco Use   Smoking status: Former    Current packs/day: 0.00    Average  packs/day: 1 pack/day for 30.0 years (30.0 ttl pk-yrs)    Types: Cigarettes    Start date: 27    Quit date: 2005    Years since quitting: 19.8   Smokeless tobacco: Never  Vaping Use   Vaping status: Never Used  Substance and Sexual Activity   Alcohol use: No   Drug use: No   Sexual activity: Not Currently    Partners: Male    Birth control/protection: Post-menopausal

## 2023-09-13 ENCOUNTER — Other Ambulatory Visit (HOSPITAL_COMMUNITY): Payer: Self-pay

## 2023-09-13 MED ORDER — LORAZEPAM 1 MG PO TABS
0.5000 mg | ORAL_TABLET | Freq: Every day | ORAL | 0 refills | Status: DC
Start: 2023-09-13 — End: 2023-10-01
  Filled 2023-09-13: qty 30, 30d supply, fill #0

## 2023-09-14 DIAGNOSIS — M545 Low back pain, unspecified: Secondary | ICD-10-CM | POA: Diagnosis not present

## 2023-09-20 ENCOUNTER — Telehealth: Payer: Self-pay | Admitting: Orthopedic Surgery

## 2023-09-20 DIAGNOSIS — M545 Low back pain, unspecified: Secondary | ICD-10-CM | POA: Diagnosis not present

## 2023-09-20 NOTE — Telephone Encounter (Signed)
Pt is requesting a telephone call in regards to scheduling surgery on RT foot. Pt is unsure of future plans and is requesting to be informed.

## 2023-09-21 ENCOUNTER — Other Ambulatory Visit (HOSPITAL_COMMUNITY): Payer: Self-pay

## 2023-09-26 ENCOUNTER — Encounter (HOSPITAL_COMMUNITY): Payer: Self-pay | Admitting: Orthopedic Surgery

## 2023-09-26 ENCOUNTER — Other Ambulatory Visit: Payer: Self-pay

## 2023-09-26 NOTE — Progress Notes (Signed)
SDW CALL  Patient was given pre-op instructions over the phone. The opportunity was given for the patient to ask questions. No further questions asked. Patient verbalized understanding of instructions given.   PCP - Theone Murdoch Hammer,MD Cardiologist - Jake Shark  PPM/ICD - denies Device Orders -  Rep Notified -   Chest x-ray - na EKG - 03/27/23 Stress Test - denies ECHO - 11/17/16 Cardiac Cath - denies  Sleep Study - denies CPAP -   Fasting Blood Sugar - na Checks Blood Sugar _____ times a day  Blood Thinner Instructions:na Aspirin Instructions:no aspirin 11/12 or 11/13  ERAS Protcol - clears until 0650 PRE-SURGERY Ensure or G2-   COVID TEST- na   Anesthesia review: yes history of afib,COPD  Patient denies shortness of breath, fever, cough and chest pain over the phone call    Surgical Instructions    Your procedure is scheduled on November 13  Report to [facility] Main Entrance "A" at 0720 A.M., then check in with the Admitting office.  Call this number if you have problems the morning of surgery:  [phone]    Remember:  Do not eat after midnight the night before your surgery  You may drink clear liquids until 0650am the morning of your surgery.   Clear liquids allowed are: Water, Non-Citrus Juices (without pulp), Carbonated Beverages, Clear Tea, Black Coffee ONLY (NO MILK, CREAM OR POWDERED CREAMER of any kind), and Gatorade   Take these medicines the morning of surgery with A SIP OF WATER: Refresh eye drops.PRN - Metoprolol  As of today, STOP taking any Aspirin (unless otherwise instructed by your surgeon) Aleve, Naproxen, Ibuprofen, Motrin, Advil, Goody's, BC's, all herbal medications,Vitamin E, fish oil, and all vitamins.  [facility] is not responsible for any belongings or valuables. .   Do NOT Smoke (Tobacco/Vaping)  24 hours prior to your procedure  If you use a CPAP at night, you may bring your mask for your overnight stay.   Contacts,  glasses, hearing aids, dentures or partials may not be worn into surgery, please bring cases for these belongings   Patients discharged the day of surgery will not be allowed to drive home, and someone needs to stay with them for 24 hours.   Special instructions:    Oral Hygiene is also important to reduce your risk of infection.  Remember - BRUSH YOUR TEETH THE MORNING OF SURGERY WITH YOUR REGULAR TOOTHPASTE   Day of Surgery:  Take a shower the day of or night before with antibacterial soap. Wear Clean/Comfortable clothing the morning of surgery Do not apply any deodorants/lotions.   Do not wear jewelry or makeup Do not wear lotions, powders, perfumes/colognes, or deodorant. Do not shave 48 hours prior to surgery.  Men may shave face and neck. Do not bring valuables to the hospital. Do not wear nail polish, gel polish, artificial nails, or any other type of covering on natural nails (fingers and toes) If you have artificial nails or gel coating that need to be removed by a nail salon, please have this removed prior to surgery. Artificial nails or gel coating may interfere with anesthesia's ability to adequately monitor your vital signs. Remember to brush your teeth WITH YOUR REGULAR TOOTHPASTE.

## 2023-09-26 NOTE — Progress Notes (Signed)
Patient was called to be informed that the surgery time for tomorrow was changed. Patient wasn't available and this writer left a message. Patient was instructed to be at the hospital at 06:00 o'clock for surgery scheduled at 08:30 o'clock. Also, patient was instructed to stop drinking clear liquids at 05:30 o'clock.

## 2023-09-27 ENCOUNTER — Ambulatory Visit (HOSPITAL_BASED_OUTPATIENT_CLINIC_OR_DEPARTMENT_OTHER): Payer: Medicare Other | Admitting: Anesthesiology

## 2023-09-27 ENCOUNTER — Encounter (HOSPITAL_COMMUNITY): Payer: Self-pay | Admitting: Orthopedic Surgery

## 2023-09-27 ENCOUNTER — Ambulatory Visit (HOSPITAL_COMMUNITY): Payer: Medicare Other | Admitting: Anesthesiology

## 2023-09-27 ENCOUNTER — Other Ambulatory Visit: Payer: Self-pay

## 2023-09-27 ENCOUNTER — Other Ambulatory Visit (HOSPITAL_COMMUNITY): Payer: Self-pay

## 2023-09-27 ENCOUNTER — Encounter (HOSPITAL_COMMUNITY)
Admission: RE | Disposition: A | Discharge status: Home / Self Care | Payer: Self-pay | Source: Home / Self Care | Attending: Orthopedic Surgery

## 2023-09-27 ENCOUNTER — Ambulatory Visit (HOSPITAL_COMMUNITY)
Admission: RE | Admit: 2023-09-27 | Discharge: 2023-09-27 | Disposition: A | Discharge status: Home / Self Care | Payer: Medicare Other | Attending: Orthopedic Surgery | Admitting: Orthopedic Surgery

## 2023-09-27 DIAGNOSIS — M869 Osteomyelitis, unspecified: Secondary | ICD-10-CM

## 2023-09-27 DIAGNOSIS — M19071 Primary osteoarthritis, right ankle and foot: Secondary | ICD-10-CM | POA: Diagnosis not present

## 2023-09-27 DIAGNOSIS — I480 Paroxysmal atrial fibrillation: Secondary | ICD-10-CM | POA: Diagnosis not present

## 2023-09-27 DIAGNOSIS — M12571 Traumatic arthropathy, right ankle and foot: Secondary | ICD-10-CM | POA: Diagnosis not present

## 2023-09-27 DIAGNOSIS — Z87891 Personal history of nicotine dependence: Secondary | ICD-10-CM | POA: Insufficient documentation

## 2023-09-27 DIAGNOSIS — Z85048 Personal history of other malignant neoplasm of rectum, rectosigmoid junction, and anus: Secondary | ICD-10-CM | POA: Diagnosis not present

## 2023-09-27 DIAGNOSIS — J449 Chronic obstructive pulmonary disease, unspecified: Secondary | ICD-10-CM | POA: Insufficient documentation

## 2023-09-27 DIAGNOSIS — I4891 Unspecified atrial fibrillation: Secondary | ICD-10-CM | POA: Diagnosis not present

## 2023-09-27 HISTORY — PX: ANKLE ARTHROSCOPY: SHX545

## 2023-09-27 LAB — CBC
HCT: 35.9 % — ABNORMAL LOW (ref 36.0–46.0)
Hemoglobin: 11.4 g/dL — ABNORMAL LOW (ref 12.0–15.0)
MCH: 25.4 pg — ABNORMAL LOW (ref 26.0–34.0)
MCHC: 31.8 g/dL (ref 30.0–36.0)
MCV: 80 fL (ref 80.0–100.0)
Platelets: 492 10*3/uL — ABNORMAL HIGH (ref 150–400)
RBC: 4.49 MIL/uL (ref 3.87–5.11)
RDW: 15.2 % (ref 11.5–15.5)
WBC: 8.4 10*3/uL (ref 4.0–10.5)
nRBC: 0 % (ref 0.0–0.2)

## 2023-09-27 LAB — COMPREHENSIVE METABOLIC PANEL
ALT: 13 U/L (ref 0–44)
AST: 23 U/L (ref 15–41)
Albumin: 3.6 g/dL (ref 3.5–5.0)
Alkaline Phosphatase: 58 U/L (ref 38–126)
Anion gap: 7 (ref 5–15)
BUN: 8 mg/dL (ref 8–23)
CO2: 24 mmol/L (ref 22–32)
Calcium: 9.2 mg/dL (ref 8.9–10.3)
Chloride: 104 mmol/L (ref 98–111)
Creatinine, Ser: 1.01 mg/dL — ABNORMAL HIGH (ref 0.44–1.00)
GFR, Estimated: 57 mL/min — ABNORMAL LOW (ref >60)
Glucose, Bld: 86 mg/dL (ref 70–99)
Potassium: 4 mmol/L (ref 3.5–5.1)
Sodium: 135 mmol/L (ref 135–145)
Total Bilirubin: 0.7 mg/dL (ref <1.2)
Total Protein: 6.2 g/dL — ABNORMAL LOW (ref 6.5–8.1)

## 2023-09-27 SURGERY — ARTHROSCOPY, ANKLE
Anesthesia: General | Site: Ankle | Laterality: Right

## 2023-09-27 MED ORDER — FENTANYL CITRATE (PF) 100 MCG/2ML IJ SOLN: 25.0000 ug | INTRAMUSCULAR | Status: DC | PRN

## 2023-09-27 MED ORDER — MIDAZOLAM HCL 5 MG/5ML IJ SOLN
INTRAMUSCULAR | Status: DC | PRN
  Administered 2023-09-27: 2 mg via INTRAVENOUS

## 2023-09-27 MED ORDER — LIDOCAINE 2% (20 MG/ML) 5 ML SYRINGE
INTRAMUSCULAR | Status: AC
  Filled 2023-09-27: qty 5

## 2023-09-27 MED ORDER — ACETAMINOPHEN 10 MG/ML IV SOLN: 1000.0000 mg | Freq: Once | INTRAVENOUS | Status: DC | PRN

## 2023-09-27 MED ORDER — MIDAZOLAM HCL 2 MG/2ML IJ SOLN
INTRAMUSCULAR | Status: AC
Start: 2023-09-27 — End: ?
  Filled 2023-09-27: qty 2

## 2023-09-27 MED ORDER — FENTANYL CITRATE (PF) 100 MCG/2ML IJ SOLN
INTRAMUSCULAR | Status: AC
  Filled 2023-09-27: qty 2

## 2023-09-27 MED ORDER — CHLORHEXIDINE GLUCONATE 0.12 % MT SOLN
OROMUCOSAL | Status: AC
  Administered 2023-09-27: 15 mL via OROMUCOSAL
  Filled 2023-09-27: qty 15

## 2023-09-27 MED ORDER — SODIUM CHLORIDE 0.9 % IR SOLN
Status: DC | PRN
  Administered 2023-09-27 (×2): 3000 mL

## 2023-09-27 MED ORDER — ORAL CARE MOUTH RINSE: 15.0000 mL | Freq: Once | OROMUCOSAL | Status: AC

## 2023-09-27 MED ORDER — GLYCOPYRROLATE 0.2 MG/ML IJ SOLN
INTRAMUSCULAR | Status: DC | PRN
  Administered 2023-09-27: .1 mg via INTRAVENOUS

## 2023-09-27 MED ORDER — LIDOCAINE HCL 1 % IJ SOLN
INTRAMUSCULAR | Status: DC | PRN
  Administered 2023-09-27: 100 mg via INTRADERMAL

## 2023-09-27 MED ORDER — ONDANSETRON HCL 4 MG/2ML IJ SOLN
INTRAMUSCULAR | Status: DC | PRN
  Administered 2023-09-27: 4 mg via INTRAVENOUS

## 2023-09-27 MED ORDER — VANCOMYCIN HCL IN DEXTROSE 1-5 GM/200ML-% IV SOLN
INTRAVENOUS | Status: AC
  Administered 2023-09-27: 1000 mg via INTRAVENOUS
  Filled 2023-09-27: qty 200

## 2023-09-27 MED ORDER — EPHEDRINE SULFATE (PRESSORS) 50 MG/ML IJ SOLN
INTRAMUSCULAR | Status: DC | PRN
  Administered 2023-09-27: 7.5 mg via INTRAVENOUS

## 2023-09-27 MED ORDER — FENTANYL CITRATE (PF) 100 MCG/2ML IJ SOLN
INTRAMUSCULAR | Status: DC | PRN
  Administered 2023-09-27 (×4): 25 ug via INTRAVENOUS

## 2023-09-27 MED ORDER — PROPOFOL 500 MG/50ML IV EMUL
INTRAVENOUS | Status: AC
  Filled 2023-09-27: qty 200

## 2023-09-27 MED ORDER — GLYCOPYRROLATE PF 0.2 MG/ML IJ SOSY
PREFILLED_SYRINGE | INTRAMUSCULAR | Status: AC
  Filled 2023-09-27: qty 1

## 2023-09-27 MED ORDER — ONDANSETRON HCL 4 MG/2ML IJ SOLN: 4.0000 mg | Freq: Once | INTRAMUSCULAR | Status: DC | PRN

## 2023-09-27 MED ORDER — 0.9 % SODIUM CHLORIDE (POUR BTL) OPTIME
TOPICAL | Status: DC | PRN
  Administered 2023-09-27: 1000 mL

## 2023-09-27 MED ORDER — LEVOFLOXACIN IN D5W 500 MG/100ML IV SOLN
500.0000 mg | INTRAVENOUS | Status: AC
  Administered 2023-09-27: 500 mg via INTRAVENOUS
  Filled 2023-09-27: qty 100

## 2023-09-27 MED ORDER — PROPOFOL 1000 MG/100ML IV EMUL
INTRAVENOUS | Status: AC
  Filled 2023-09-27: qty 100

## 2023-09-27 MED ORDER — PROPOFOL 10 MG/ML IV BOLUS
INTRAVENOUS | Status: DC | PRN
  Administered 2023-09-27: 90 mg via INTRAVENOUS

## 2023-09-27 MED ORDER — CHLORHEXIDINE GLUCONATE 0.12 % MT SOLN: 15.0000 mL | Freq: Once | OROMUCOSAL | Status: AC

## 2023-09-27 MED ORDER — EPHEDRINE 5 MG/ML INJ
INTRAVENOUS | Status: AC
  Filled 2023-09-27: qty 5

## 2023-09-27 MED ORDER — LACTATED RINGERS IV SOLN: INTRAVENOUS | Status: DC

## 2023-09-27 MED ORDER — OXYCODONE-ACETAMINOPHEN 5-325 MG PO TABS
1.0000 | ORAL_TABLET | ORAL | 0 refills | Status: DC | PRN
  Filled 2023-09-27: qty 30, 5d supply, fill #0

## 2023-09-27 MED ORDER — PHENYLEPHRINE HCL-NACL 20-0.9 MG/250ML-% IV SOLN
INTRAVENOUS | Status: AC
  Filled 2023-09-27: qty 500

## 2023-09-27 MED ORDER — VANCOMYCIN HCL IN DEXTROSE 1-5 GM/200ML-% IV SOLN: 1000.0000 mg | INTRAVENOUS | Status: AC

## 2023-09-27 SURGICAL SUPPLY — 42 items
BAG COUNTER SPONGE SURGICOUNT (BAG) ×1 IMPLANT
BLADE CUDA 5.5 (BLADE) IMPLANT
BLADE EXCALIBUR 4.0X13 (MISCELLANEOUS) IMPLANT
BNDG COHESIVE 6X5 TAN ST LF (GAUZE/BANDAGES/DRESSINGS) ×1 IMPLANT
BNDG GAUZE DERMACEA FLUFF 4 (GAUZE/BANDAGES/DRESSINGS) IMPLANT
BUR OVAL 4.0 (BURR) IMPLANT
COVER SURGICAL LIGHT HANDLE (MISCELLANEOUS) ×2 IMPLANT
CUFF TOURN SGL QUICK 34 (TOURNIQUET CUFF)
CUFF TOURN SGL QUICK 42 (TOURNIQUET CUFF) IMPLANT
CUFF TRNQT CYL 34X4.125X (TOURNIQUET CUFF) IMPLANT
DRAPE ARTHROSCOPY W/POUCH 114 (DRAPES) ×1 IMPLANT
DRAPE OEC MINIVIEW 54X84 (DRAPES) IMPLANT
DRAPE U-SHAPE 47X51 STRL (DRAPES) ×1 IMPLANT
DRSG EMULSION OIL 3X3 NADH (GAUZE/BANDAGES/DRESSINGS) ×1 IMPLANT
DURAPREP 26ML APPLICATOR (WOUND CARE) ×1 IMPLANT
GAUZE PAD ABD 8X10 STRL (GAUZE/BANDAGES/DRESSINGS) ×1 IMPLANT
GAUZE SPONGE 4X4 12PLY STRL (GAUZE/BANDAGES/DRESSINGS) ×1 IMPLANT
GLOVE BIOGEL PI IND STRL 9 (GLOVE) ×1 IMPLANT
GLOVE SURG ORTHO 9.0 STRL STRW (GLOVE) ×1 IMPLANT
GOWN STRL REUS W/ TWL XL LVL3 (GOWN DISPOSABLE) ×3 IMPLANT
GOWN STRL REUS W/TWL XL LVL3 (GOWN DISPOSABLE) ×2
KIT BASIN OR (CUSTOM PROCEDURE TRAY) ×1 IMPLANT
KIT TURNOVER KIT B (KITS) ×1 IMPLANT
MANIFOLD NEPTUNE II (INSTRUMENTS) ×1 IMPLANT
NDL 18GX1X1/2 (RX/OR ONLY) (NEEDLE) ×1 IMPLANT
NEEDLE 18GX1X1/2 (RX/OR ONLY) (NEEDLE) IMPLANT
PACK ARTHROSCOPY DSU (CUSTOM PROCEDURE TRAY) ×1 IMPLANT
PAD ARMBOARD 7.5X6 YLW CONV (MISCELLANEOUS) ×2 IMPLANT
PADDING CAST COTTON 6X4 STRL (CAST SUPPLIES) ×1 IMPLANT
PORT APPOLLO RF 90DEGREE MULTI (SURGICAL WAND) IMPLANT
PROBE APOLLO 90XL (SURGICAL WAND) IMPLANT
SPONGE T-LAP 4X18 ~~LOC~~+RFID (SPONGE) ×1 IMPLANT
SUT ETHILON 4 0 PS 2 18 (SUTURE) ×1 IMPLANT
SUT MNCRL AB 3-0 PS2 18 (SUTURE) IMPLANT
SUT VIC AB 2-0 CT1 27 (SUTURE)
SUT VIC AB 2-0 CT1 TAPERPNT 27 (SUTURE) IMPLANT
SYR 20ML LL LF (SYRINGE) ×1 IMPLANT
TAPE STRIPS DRAPE STRL (GAUZE/BANDAGES/DRESSINGS) IMPLANT
TOWEL GREEN STERILE (TOWEL DISPOSABLE) ×1 IMPLANT
TOWEL GREEN STERILE FF (TOWEL DISPOSABLE) ×1 IMPLANT
TUBING ARTHROSCOPY IRRIG 16FT (MISCELLANEOUS) ×1 IMPLANT
WATER STERILE IRR 1000ML POUR (IV SOLUTION) ×1 IMPLANT

## 2023-09-27 NOTE — Progress Notes (Signed)
Orthopedic Tech Progress Note Patient Details:  Julie Mann [facility] 1946/01/15 [id number]  Ortho Devices Type of Ortho Device: CAM walker Ortho Device/Splint Location: RLE Ortho Device/Splint Interventions: Application   Post Interventions Patient Tolerated: Well  Julie Mann E Julie Mann 09/27/2023, 10:12 AM

## 2023-09-27 NOTE — Op Note (Signed)
09/27/2023  9:36 AM  PATIENT:  Luis Abed Shenker    PRE-OPERATIVE DIAGNOSIS:  Osteoarthritis right ankle  POST-OPERATIVE DIAGNOSIS:  Same  PROCEDURE:  RIGHT ANKLE ARTHROSCOPY AND DEBRIDEMENT  SURGEON:  Nadara Mustard, MD  PHYSICIAN ASSISTANT:None ANESTHESIA:   General  PREOPERATIVE INDICATIONS:  Julie Mann is a  77 y.o. female with a diagnosis of Osteoarthritis right ankle who failed conservative measures and elected for surgical management.    The risks benefits and alternatives were discussed with the patient preoperatively including but not limited to the risks of infection, bleeding, nerve injury, cardiopulmonary complications, the need for revision surgery, among others, and the patient was willing to proceed.  OPERATIVE IMPLANTS:   * No implants in log *  @ENCIMAGES @  OPERATIVE FINDINGS: Operative findings showed extensive traumatic arthritis with delamination of the cartilage off the tibial and talar joint.  Gutters were full of scar tissue.  Anterior scar tissue also causing impingement.  OPERATIVE PROCEDURE: Patient brought the operating room and underwent a general anesthetic.  After adequate levels anesthesia were obtained patient's right lower extremity was prepped using DuraPrep draped into a sterile field a timeout was called.  An 18-gauge needle was inserted through the anterior medial portal this was inserted into the joint.  The skin was incised blunt dissection was carried down the capsule and a blunt trocar was used inserted into the joint.  Using visualization from the inside out an 18-gauge needle was inserted through the anterior lateral portal into the joint the alignment was verified the skin was incised blunt dissection was carried down to the joint and a blunt trocar was used inserted into the joint.  A shaver was first placed and under direct visualization the anterior impingement was debrided at both anteriorly as well as medial lateral gutters.  Patient had  multiple loose bodies and delaminated cartilage.  This was removed with the shaver and a probe was used to further debride loose bodies from the tibial talar joint.  The joint was debrided cleansed and gutters and joint were further inspected.  The electrocautery was used hemostasis.  Instruments removed portals closed using 2-0 nylon a sterile dressing was applied patient was extubated taken the PACU in stable condition.   DISCHARGE PLANNING:  Antibiotic duration: Preoperative antibiotics  Weightbearing: Weightbearing as tolerated  Pain medication: Prescription for Percocet patient states she cannot tolerate Percocet  Dressing care/ Wound VAC: Dry dressing removed in 3 days  Ambulatory devices: Crutches weightbearing as tolerated  Discharge to: Home.  Follow-up: In the office 1 week post operative.

## 2023-09-27 NOTE — Anesthesia Procedure Notes (Signed)
Procedure Name: LMA Insertion Date/Time: 09/27/2023 9:03 AM  Performed by: Floydene Flock, CRNAPre-anesthesia Checklist: Emergency Drugs available, Patient identified, Suction available, Patient being monitored and Timeout performed Patient Re-evaluated:Patient Re-evaluated prior to induction Oxygen Delivery Method: Circle system utilized Preoxygenation: Pre-oxygenation with 100% oxygen Induction Type: IV induction LMA: LMA inserted LMA Size: 4.0 Tube secured with: Tape Dental Injury: Teeth and Oropharynx as per pre-operative assessment

## 2023-09-27 NOTE — Anesthesia Preprocedure Evaluation (Signed)
Anesthesia Evaluation  Patient identified by MRN, date of birth, ID band Patient awake    Reviewed: Allergy & Precautions, NPO status , Patient's Chart, lab work & pertinent test results, reviewed documented beta blocker date and time   History of Anesthesia Complications Negative for: history of anesthetic complications  Airway Mallampati: IV  TM Distance: >3 FB     Dental no notable dental hx.    Pulmonary shortness of breath, neg sleep apnea, COPD, neg recent URI, former smoker   breath sounds clear to auscultation       Cardiovascular (-) hypertension(-) angina (-) CAD, (-) Past MI and (-) Cardiac Stents + dysrhythmias Atrial Fibrillation  Rhythm:Regular Rate:Normal     Neuro/Psych neg Seizures PSYCHIATRIC DISORDERS Anxiety Depression     Neuromuscular disease    GI/Hepatic ,neg GERD  ,,(+) neg Cirrhosis        Endo/Other  neg diabetes    Renal/GU Renal InsufficiencyRenal disease     Musculoskeletal  (+) Arthritis ,    Abdominal   Peds  Hematology  (+) Blood dyscrasia, anemia   Anesthesia Other Findings   Reproductive/Obstetrics                              Anesthesia Physical Anesthesia Plan  ASA: 2  Anesthesia Plan: General   Post-op Pain Management:    Induction: Intravenous  PONV Risk Score and Plan: 2 and Ondansetron  Airway Management Planned: LMA  Additional Equipment:   Intra-op Plan:   Post-operative Plan: Extubation in OR  Informed Consent: I have reviewed the patients History and Physical, chart, labs and discussed the procedure including the risks, benefits and alternatives for the proposed anesthesia with the patient or authorized representative who has indicated his/her understanding and acceptance.     Dental advisory given  Plan Discussed with: CRNA  Anesthesia Plan Comments:          Anesthesia Quick Evaluation

## 2023-09-27 NOTE — Anesthesia Postprocedure Evaluation (Signed)
Anesthesia Post Note  Patient: Julie Mann [facility]  Procedure(s) Performed: RIGHT ANKLE ARTHROSCOPY AND DEBRIDEMENT (Right: Ankle)     Patient location during evaluation: PACU Anesthesia Type: General Level of consciousness: awake and alert Pain management: pain level controlled Vital Signs Assessment: post-procedure vital signs reviewed and stable Respiratory status: spontaneous breathing, nonlabored ventilation, respiratory function stable and patient connected to nasal cannula oxygen Cardiovascular status: blood pressure returned to baseline and stable Postop Assessment: no apparent nausea or vomiting Anesthetic complications: no   No notable events documented.  Last Vitals:  Vitals:   09/27/23 1000 09/27/23 1015  BP: (!) 149/73   Pulse: 66   Resp: 19   Temp:  (!) 36.1 C  SpO2: 96%     Last Pain:  Vitals:   09/27/23 1015  TempSrc:   PainSc: 0-No pain                 Mariann Barter

## 2023-09-27 NOTE — H&P (Signed)
Julie Mann is an 77 y.o. female.   Chief Complaint: right ankle pain HPI: Patient is a 77 year old woman who was seen in follow-up for the right foot and ankle. Patient most recently had a Lisfranc injury across her midfoot. This resolved with immobilization in a fracture boot. Patient states that now she has resumed regular shoewear she has been having increasing anterior ankle pain. She does have traumatic arthritis status post open reduction internal fixation for a trimalleolar ankle fracture.   Past Medical History:  Diagnosis Date   Anal cancer (HCC) 2005   SCCa anus-stage II chemo, radiation   Anxiety    Arthritis    "hands, fingers, back" (02/27/2014)   Atrial fibrillation (HCC)    Cat scratch of right hand 02/24/2014   COPD (chronic obstructive pulmonary disease) (HCC)    Dyspnea    walking up hill;.  stairs (If Iam tired)   Dysrhythmia    PAF   Fall from horse 01/2018   Fracture, ankle 02/2019   Right    High cholesterol    History of stomach ulcers ~ 1966   Osteopenia     Past Surgical History:  Procedure Laterality Date   ANUS SURGERY  2005   "biopsy"   BREAST CYST EXCISION Right 1966   DILATION AND CURETTAGE OF UTERUS  1980's   S/P miscarriage   EXTERNAL FIXATION LEG Right 03/10/2019   Procedure: OPEN REDUCTION INTERNAL FIXATION RIGHT ANKLE;  Surgeon: Teryl Lucy, MD;  Location: [facility] OR;  Service: Orthopedics;  Laterality: Right;   HARDWARE REMOVAL Right 02/05/2021   Procedure: REMOVAL OF HARDWARE RIGHT ANKLE;  Surgeon: Nadara Mustard, MD;  Location: [facility] OR;  Service: Orthopedics;  Laterality: Right;   VARICOSE VEIN SURGERY Left    left leg   VIDEO BRONCHOSCOPY WITH ENDOBRONCHIAL NAVIGATION Right 08/25/2020   Procedure: VIDEO BRONCHOSCOPY WITH ENDOBRONCHIAL NAVIGATION WITH FIDUCIAL PLACEMENT;  Surgeon: Josephine Igo, DO;  Location: [facility] OR;  Service: Pulmonary;  Laterality: Right;    Family History  Problem Relation Age of Onset   Breast cancer Maternal Aunt     Breast cancer Maternal Grandmother    Heart disease Mother    Stroke Father    Rectal cancer Paternal Grandmother    Breast cancer Sister 53   Breast cancer Sister 6   Social History:  reports that she quit smoking about 19 years ago. Her smoking use included cigarettes. She started smoking about 49 years ago. She has a 30 pack-year smoking history. She has never used smokeless tobacco. She reports that she does not drink alcohol and does not use drugs.  Allergies:  Allergies  Allergen Reactions   Duloxetine Other (See Comments)    Pt states put her in a coma   Eliquis [Apixaban] Anaphylaxis    Made her feel sick   Ivp Dye [Iodinated Contrast Media] Swelling    01/05/2022 small amount of contrast used in Transforaminal epidural without problem.   Oxycodone Other (See Comments)    Went into Afib.    Tiotropium Bromide Monohydrate Other (See Comments)    Headache, Blurry vision, chest heaviness.   Conjugated Estrogens     Felt weird Other reaction(s): hallucinations   Doxycycline Other (See Comments)    Dizziness and tingling   Erythromycin Other (See Comments)    Hallucinations    Flonase [Fluticasone Propionate]    Hydrocodone    Keflex [Cephalexin] Other (See Comments)    Hallucinations.   Other     Other  reaction(s): hallucinations Other reaction(s): sedation Other reaction(s): hallucinations   Paxil [Paroxetine Hcl] Diarrhea   Prednisone     blurry eyes   Provera [Medroxyprogesterone Acetate]     Patient do not remember reaction   Xarelto [Rivaroxaban]     Made her feel sick   Zithromax [Azithromycin] Other (See Comments)    hallucinations   Zoloft [Sertraline Hcl] Other (See Comments)    sedation    Medications Prior to Admission  Medication Sig Dispense Refill   aspirin EC 81 MG tablet Take 1 tablet (81 mg total) by mouth daily. 90 tablet 3   LORazepam (ATIVAN) 1 MG tablet Take 1/2-1 tablet (0.5-1 mg total) by mouth as needed. (Patient taking differently:  Take 1 mg by mouth at bedtime.) 30 tablet 0   metoprolol tartrate (LOPRESSOR) 25 MG tablet Take 25 mg by mouth daily as needed (palpitations/AFIB).      Multiple Vitamins-Minerals (MULTIVITAMIN PO) Take 1 tablet by mouth daily.     naproxen sodium (ALEVE) 220 MG tablet Take 220 mg by mouth daily as needed (Pain).     OVER THE COUNTER MEDICATION Take 1 application  by mouth daily. Progest face cream     Propylene Glycol-Glycerin 0.6-0.6 % SOLN Place 1 drop into both eyes daily. Refresh Tears     vitamin E 180 MG (400 UNITS) capsule Take 400 Units by mouth daily.     atorvastatin (LIPITOR) 10 MG tablet Take 1 tablet (10 mg total) by mouth 2 (two) times a week on monday and friday (Patient not taking: Reported on 09/21/2023) 26 tablet 3   cyanocobalamin (VITAMIN B12) 1000 MCG tablet Take 1 tablet (1,000 mcg total) by mouth daily. (Patient not taking: Reported on 09/21/2023) 30 tablet 2   ibuprofen (ADVIL) 600 MG tablet Take 1 tablet (600 mg total) by mouth as needed for up to 1 dose. (Patient not taking: Reported on 09/21/2023) 30 tablet 1   influenza vaccine adjuvanted (FLUAD QUADRIVALENT) 0.5 ML injection Inject 0.5 mLs into the muscle. 0.5 mL 0   LORazepam (ATIVAN) 1 MG tablet Take 1 tablet (1 mg total) by mouth at bedtime as needed for anxiety or sleep. (Patient not taking: Reported on 09/21/2023) 30 tablet 2   metoprolol succinate (TOPROL XL) 25 MG 24 hr tablet Take 1 tablet (25 mg total) by mouth daily. (Patient not taking: Reported on 09/21/2023) 90 tablet 3   olmesartan (BENICAR) 20 MG tablet Take 0.5 tablets (10 mg total) by mouth daily. (Patient not taking: Reported on 03/24/2023) 30 tablet 1    No results found for this or any previous visit (from the past 48 hour(s)). No results found.  Review of Systems  All other systems reviewed and are negative.   Last menstrual period 11/14/1992. Physical Exam  Patient is alert, oriented, no adenopathy, well-dressed, normal affect, normal respiratory  effort. Examination patient has congruent range of motion of the right ankle there is venous swelling the right lower extremity.  There is no cellulitis.  She has a palpable dorsalis pedis pulse.  Patient is tender to palpation over the medial lateral gutters of the ankle as well as anteriorly. Assessment/Plan 1. Traumatic arthritis of right ankle       Plan: Discussed treatment options for the traumatic arthritis of her ankle.  Discussed repeat injection versus arthroscopic debridement.  Patient states that she has had increased pain after her last injection of the ankle and would like to proceed with arthroscopic debridement.  Will set this up as  an outpatient.  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  Nadara Mustard, MD 09/27/2023, 6:31 AM

## 2023-09-27 NOTE — Transfer of Care (Signed)
Immediate Anesthesia Transfer of Care Note  Patient: Julie Mann  Procedure(s) Performed: RIGHT ANKLE ARTHROSCOPY AND DEBRIDEMENT (Right: Ankle)  Patient Location: PACU  Anesthesia Type:General  Level of Consciousness: awake, alert , and oriented  Airway & Oxygen Therapy: Patient Spontanous Breathing and Patient connected to face mask oxygen  Post-op Assessment: Report given to RN and Post -op Vital signs reviewed and stable  Post vital signs: Reviewed and stable  Last Vitals:  Vitals Value Taken Time  BP 144/68 09/27/23 0945  Temp    Pulse 68 09/27/23 0945  Resp 14 09/27/23 0945  SpO2 97 % 09/27/23 0945  Vitals shown include unfiled device data.  Last Pain:  Vitals:   09/27/23 0711  TempSrc: Oral  PainSc:          Complications: No notable events documented.

## 2023-09-28 ENCOUNTER — Encounter (HOSPITAL_COMMUNITY): Payer: Self-pay | Admitting: Orthopedic Surgery

## 2023-09-30 ENCOUNTER — Other Ambulatory Visit: Payer: Medicare Other

## 2023-10-01 ENCOUNTER — Other Ambulatory Visit: Payer: Self-pay

## 2023-10-01 ENCOUNTER — Encounter (HOSPITAL_COMMUNITY): Payer: Self-pay

## 2023-10-01 ENCOUNTER — Emergency Department (HOSPITAL_BASED_OUTPATIENT_CLINIC_OR_DEPARTMENT_OTHER): Payer: Medicare Other

## 2023-10-01 ENCOUNTER — Emergency Department (HOSPITAL_COMMUNITY): Payer: Medicare Other

## 2023-10-01 ENCOUNTER — Emergency Department (HOSPITAL_COMMUNITY)
Admission: EM | Admit: 2023-10-01 | Discharge: 2023-10-01 | Disposition: A | Discharge status: Home / Self Care | Payer: Medicare Other | Attending: Emergency Medicine | Admitting: Emergency Medicine

## 2023-10-01 DIAGNOSIS — Z7982 Long term (current) use of aspirin: Secondary | ICD-10-CM | POA: Diagnosis not present

## 2023-10-01 DIAGNOSIS — M25571 Pain in right ankle and joints of right foot: Secondary | ICD-10-CM | POA: Insufficient documentation

## 2023-10-01 DIAGNOSIS — M79671 Pain in right foot: Secondary | ICD-10-CM | POA: Insufficient documentation

## 2023-10-01 DIAGNOSIS — G8918 Other acute postprocedural pain: Secondary | ICD-10-CM | POA: Diagnosis not present

## 2023-10-01 DIAGNOSIS — M79661 Pain in right lower leg: Secondary | ICD-10-CM

## 2023-10-01 DIAGNOSIS — Z79899 Other long term (current) drug therapy: Secondary | ICD-10-CM | POA: Diagnosis not present

## 2023-10-01 DIAGNOSIS — I10 Essential (primary) hypertension: Secondary | ICD-10-CM | POA: Insufficient documentation

## 2023-10-01 DIAGNOSIS — I700 Atherosclerosis of aorta: Secondary | ICD-10-CM | POA: Diagnosis not present

## 2023-10-01 DIAGNOSIS — R0602 Shortness of breath: Secondary | ICD-10-CM | POA: Diagnosis not present

## 2023-10-01 LAB — CBC
HCT: 38.5 % (ref 36.0–46.0)
Hemoglobin: 11.9 g/dL — ABNORMAL LOW (ref 12.0–15.0)
MCH: 24.8 pg — ABNORMAL LOW (ref 26.0–34.0)
MCHC: 30.9 g/dL (ref 30.0–36.0)
MCV: 80.2 fL (ref 80.0–100.0)
Platelets: 424 10*3/uL — ABNORMAL HIGH (ref 150–400)
RBC: 4.8 MIL/uL (ref 3.87–5.11)
RDW: 15.2 % (ref 11.5–15.5)
WBC: 8.4 10*3/uL (ref 4.0–10.5)
nRBC: 0 % (ref 0.0–0.2)

## 2023-10-01 LAB — BASIC METABOLIC PANEL
Anion gap: 11 (ref 5–15)
BUN: 8 mg/dL (ref 8–23)
CO2: 22 mmol/L (ref 22–32)
Calcium: 9.3 mg/dL (ref 8.9–10.3)
Chloride: 101 mmol/L (ref 98–111)
Creatinine, Ser: 0.84 mg/dL (ref 0.44–1.00)
GFR, Estimated: >60 mL/min (ref >60)
Glucose, Bld: 130 mg/dL — ABNORMAL HIGH (ref 70–99)
Potassium: 4 mmol/L (ref 3.5–5.1)
Sodium: 134 mmol/L — ABNORMAL LOW (ref 135–145)

## 2023-10-01 LAB — TROPONIN I (HIGH SENSITIVITY)
Troponin I (High Sensitivity): 12 ng/L (ref <18)
Troponin I (High Sensitivity): 12 ng/L (ref <18)

## 2023-10-01 MED ORDER — ACETAMINOPHEN 500 MG PO TABS
1000.0000 mg | ORAL_TABLET | Freq: Once | ORAL | Status: AC
  Administered 2023-10-01: 1000 mg via ORAL
  Filled 2023-10-01: qty 2

## 2023-10-01 MED ORDER — METOPROLOL SUCCINATE ER 25 MG PO TB24
25.0000 mg | ORAL_TABLET | Freq: Every day | ORAL | Status: DC
  Administered 2023-10-01: 25 mg via ORAL
  Filled 2023-10-01: qty 1

## 2023-10-01 NOTE — ED Provider Triage Note (Signed)
Emergency Medicine Provider Triage Evaluation Note  Julie Mann , a 76 y.o. female  was evaluated in triage.  Pt complains of right lower extremity swelling, pain and "feeling off". S/p orthopedic procedure of right ankle.  Review of Systems  Positive: Right leg pain, swelling, shortness of breath Negative: Chest pain, fever  Physical Exam  BP (!) 200/75 (BP Location: Right Arm)   Pulse 72   Temp (!) 97.4 F (36.3 C)   Resp 18   Ht 5\' 7"  (1.702 m)   Wt 54.4 kg   LMP 11/14/1992   SpO2 97%   BMI 18.79 kg/m  Gen:   Awake, no distress   Resp:  Normal effort  MSK:   Moves extremities without difficulty  Other:  No evidence of infectious complication of wound, some soft tissue swelling noted  Medical Decision Making  Medically screening exam initiated at 12:30 PM.  Appropriate orders placed.  Julie Mann was informed that the remainder of the evaluation will be completed by another provider, this initial triage assessment does not replace that evaluation, and the importance of remaining in the ED until their evaluation is complete.  Workup initiated in triage    Olene Floss, [place] 10/01/23 1232

## 2023-10-01 NOTE — Progress Notes (Addendum)
VASCULAR LAB    Right lower extremity venous duplex has been performed.  See CV proc for preliminary results.  Gave verbal report to Dr. Havery Moros, [facility], RVT 10/01/2023, 3:17 PM

## 2023-10-01 NOTE — ED Provider Notes (Signed)
[facility] AT [facility] Provider Note   CSN: [id number] Arrival date & time: 10/01/23  1124     History  Chief Complaint  Patient presents with   Post-op Problem    Julie Mann is a 77 y.o. female.  HPI Patient presents 4 days after repair following a Lisfranc injury of the right foot.  She now presents with severe pain in the right foot, ankle, worsening today after several days of essentially unremarkable recovery.  No systemic complaints, fever, chills or other concerns.  She is here with her husband who assists with the history.     Home Medications Prior to Admission medications   Medication Sig Start Date End Date Taking? Authorizing Provider  aspirin EC 81 MG tablet Take 1 tablet (81 mg total) by mouth daily. Patient taking differently: Take 81 mg by mouth See admin instructions. Every other day to every few days. 08/03/17  Yes Lyn Records, MD  atorvastatin (LIPITOR) 10 MG tablet Take 1 tablet (10 mg total) by mouth 2 (two) times a week on monday and friday 01/02/23  Yes   ibuprofen (ADVIL) 600 MG tablet Take 1 tablet (600 mg total) by mouth as needed for up to 1 dose. 08/24/23  Yes   LORazepam (ATIVAN) 1 MG tablet Take 1 tablet (1 mg total) by mouth at bedtime as needed for anxiety or sleep. 12/08/22  Yes Jerene Bears, MD  metoprolol succinate (TOPROL XL) 25 MG 24 hr tablet Take 1 tablet (25 mg total) by mouth daily. Patient taking differently: Take 25 mg by mouth daily as needed (palpitations/AFIB). 03/24/23  Yes Kathleene Hazel, MD  naproxen sodium (ALEVE) 220 MG tablet Take 220 mg by mouth daily as needed (Pain).   Yes [provider]  oxyCODONE-acetaminophen (PERCOCET/ROXICET) 5-325 MG tablet Take 1 tablet by mouth every 4 (four) hours as needed. 09/27/23  Yes Nadara Mustard, MD  vitamin E 180 MG (400 UNITS) capsule Take 400 Units by mouth daily.   Yes [provider]  cyanocobalamin (VITAMIN B12) 1000 MCG  tablet Take 1 tablet (1,000 mcg total) by mouth daily. Patient not taking: Reported on 09/21/2023 06/15/23   Si Gaul, MD  influenza vaccine adjuvanted (FLUAD QUADRIVALENT) 0.5 ML injection Inject 0.5 mLs into the muscle. 08/09/22   Judyann Munson, MD  olmesartan (BENICAR) 20 MG tablet Take 0.5 tablets (10 mg total) by mouth daily. Patient not taking: Reported on 03/24/2023 03/08/23     DULoxetine (CYMBALTA) 30 MG capsule Take 1 capsule (30 mg total) by mouth daily. 08/29/22 11/16/22        Allergies    Duloxetine, Eliquis [apixaban], Ivp dye [iodinated contrast media], Oxycodone, Tiotropium bromide monohydrate, Conjugated estrogens, Doxycycline, Erythromycin, Flonase [fluticasone propionate], Hydrocodone, Keflex [cephalexin], Other, Paxil [paroxetine hcl], Prednisone, Provera [medroxyprogesterone acetate], Xarelto [rivaroxaban], Zithromax [azithromycin], and Zoloft [sertraline hcl]    Review of Systems   Review of Systems  Physical Exam Updated Vital Signs BP (!) 210/69   Pulse 70   Temp 97.6 F (36.4 C) (Oral)   Resp 18   Ht 5\' 7"  (1.702 m)   Wt 54.4 kg   LMP 11/14/1992   SpO2 100%   BMI 18.79 kg/m  Physical Exam Vitals and nursing note reviewed.  Constitutional:      General: She is not in acute distress.    Appearance: She is well-developed.  HENT:     Head: Normocephalic and atraumatic.  Eyes:     Conjunctiva/sclera:  Conjunctivae normal.  Cardiovascular:     Rate and Rhythm: Normal rate and regular rhythm.     Pulses: Normal pulses.  Pulmonary:     Effort: Pulmonary effort is normal. No respiratory distress.     Breath sounds: Normal breath sounds. No stridor.  Abdominal:     General: There is no distension.  Musculoskeletal:       Legs:  Skin:    General: Skin is warm and dry.  Neurological:     Mental Status: She is alert and oriented to person, place, and time.     Cranial Nerves: No cranial nerve deficit.  Psychiatric:        Mood and Affect: Mood  normal.     ED Results / Procedures / Treatments   Labs (all labs ordered are listed, but only abnormal results are displayed) Labs Reviewed  BASIC METABOLIC PANEL - Abnormal; Notable for the following components:      Result Value   Sodium 134 (*)    Glucose, Bld 130 (*)    All other components within normal limits  CBC - Abnormal; Notable for the following components:   Hemoglobin 11.9 (*)    MCH 24.8 (*)    Platelets 424 (*)    All other components within normal limits  TROPONIN I (HIGH SENSITIVITY)  TROPONIN I (HIGH SENSITIVITY)    EKG EKG Interpretation Date/Time:  Sunday October 01 2023 11:07:48 EST Ventricular Rate:  71 PR Interval:  184 QRS Duration:  86 QT Interval:  400 QTC Calculation: 434 R Axis:   89  Text Interpretation: Normal sinus rhythm Normal ECG When compared with ECG of 27-Mar-2023 15:01, PREVIOUS ECG IS PRESENT Confirmed by Gerhard Munch [phone]) on 10/01/2023 1:36:14 PM  Radiology DG Chest 2 View  Result Date: 10/01/2023 CLINICAL DATA:  Shortness of breath EXAM: CHEST - 2 VIEW COMPARISON:  03/21/2023 FINDINGS: The heart size and mediastinal contours are within normal limits. Aortic atherosclerosis. Fiducial markers in the right upper lobe. Hyperinflated lungs. No focal airspace consolidation, pleural effusion, or pneumothorax. The visualized skeletal structures are unremarkable. IMPRESSION: No active cardiopulmonary disease. Electronically Signed   By: Duanne Guess D.O.   On: 10/01/2023 11:53    Procedures Procedures    Medications Ordered in ED Medications  metoprolol succinate (TOPROL-XL) 24 hr tablet 25 mg (25 mg Oral Given 10/01/23 1527)  acetaminophen (TYLENOL) tablet 1,000 mg (1,000 mg Oral Given 10/01/23 1527)    ED Course/ Medical Decision Making/ A&P                                 Medical Decision Making Patient presents 4 days after elective orthopedic procedure now with substantial pain in her right foot.  She has no  systemic complaints, no dyspnea, no chest pain, still evidence for PE, though in the perioperative period this is a consideration. Ultrasound ordered, x-ray ordered from triage.  Labs unremarkable, reviewed.  Pulse ox 100% room air normal Cardiac 65 normal   Amount and/or Complexity of Data Reviewed Independent Historian: spouse External Data Reviewed: notes.    Details: Ortho notes from procedure reviewed Labs: ordered. Decision-making details documented in ED Course. Radiology: ordered and independent interpretation performed. Decision-making details documented in ED Course. ECG/medicine tests: ordered and independent interpretation performed. Decision-making details documented in ED Course.  Risk OTC drugs. Prescription drug management. Decision regarding hospitalization.   3:42 PM Patient in no distress, awake, alert.  She has been monitored for hours without decompensation.  I discussed the patient's ultrasound with the ultrasonographer herself, no evidence for DVT, nor arterial compromise.  X-ray chest without changes, no evidence for PE, DVT, superficial changes suggesting infection patient encouraged to continue taking her home medications, follow-up with orthopedics, discharged in stable condition.  She does have mild hypertension, but is not taking all of her medication today, and there is no evidence for endorgan damage.        Final Clinical Impression(s) / ED Diagnoses Final diagnoses:  Post-operative pain    Rx / DC Orders ED Discharge Orders     None         Gerhard Munch, MD 10/01/23 [phone]

## 2023-10-01 NOTE — Discharge Instructions (Signed)
Today's evaluation has been reassuring.  Return for concerning changes in your condition.  Otherwise follow-up with your orthopedist.

## 2023-10-01 NOTE — ED Triage Notes (Addendum)
Patient had surgery on right ankle to clean out arthritis but woke up this morning feeling not right and her heart racing.  Patient also complains of pain in right foot with swelling. +SOB  patient appears hoarse as well. +right pedal pulse with mild swelling.

## 2023-10-02 ENCOUNTER — Ambulatory Visit: Payer: Medicare Other | Admitting: Podiatry

## 2023-10-04 ENCOUNTER — Ambulatory Visit (INDEPENDENT_AMBULATORY_CARE_PROVIDER_SITE_OTHER): Payer: Medicare Other | Admitting: Family

## 2023-10-04 ENCOUNTER — Encounter: Payer: Self-pay | Admitting: Family

## 2023-10-04 DIAGNOSIS — M12571 Traumatic arthropathy, right ankle and foot: Secondary | ICD-10-CM

## 2023-10-04 NOTE — Progress Notes (Signed)
Post-Op Visit Note   Patient: Julie Mann           Date of Birth: 05/27/1946           MRN: [id number] Visit Date: 10/04/2023 PCP: Irven Coe, MD  Chief Complaint:  Chief Complaint  Patient presents with   Right Ankle - Routine Post Op    09/27/23 right ankle scope and debridement    HPI:  HPI The patient is a 77 year old woman seen status post right ankle arthroscopy with debridement she has been doing well she feels but after having her surgical dressing removed she her pain has been controlled.  She is ready to resume her activities of daily living Ortho Exam On examination right ankle there is some ecchymosis no erythema sutures harvested without incident the portals are clean and dry  Visit Diagnoses: No diagnosis found. plan: May increase her activities as tolerated.  Regular shoewear follow-up in 4 weeks  Follow-Up Instructions: No follow-ups on file.   Imaging: DG Ankle 2 Views Right  Result Date: 10/03/2023 Please see detailed radiograph report in office note.  DG Foot Complete Right  Result Date: 10/03/2023 Please see detailed radiograph report in office note.   Orders:  No orders of the defined types were placed in this encounter.  No orders of the defined types were placed in this encounter.    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   Lumbar radiculopathy 12/08/2022   Piriformis syndrome 12/08/2022   Dry eyes, bilateral 07/28/2022   Low back pain 03/09/2022   Pelvic pain 03/09/2022   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   Posterior vitreous detachment of both eyes 07/27/2021   Pseudophakia of right eye 07/27/2021   Pseudophakia of left eye 07/27/2021   Vitreomacular adhesion of right eye 07/27/2021   Macular pucker, right eye 07/27/2021   Hardware complicating wound infection (HCC)    Neck pain 10/05/2020   Nodule of upper lobe of right lung 08/17/2020   Biceps tendonosis  of left shoulder 02/13/2020   Impingement syndrome of left shoulder 02/13/2020   Post-operative state 02/13/2020   Ankle dislocation, right, initial encounter 03/10/2019   Trimalleolar fracture of ankle, closed, right, initial encounter    Chronic left shoulder pain 02/19/2019   Anxiety 12/17/2016   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  stairs (If Iam tired)   Dysrhythmia    PAF   Fall from horse 01/2018   Fracture, ankle 02/2019   Right    High cholesterol    History of stomach ulcers ~ 1966   Osteopenia     Family History  Problem Relation Age of Onset   Breast cancer Maternal Aunt    Breast cancer Maternal Grandmother    Heart disease Mother    Stroke Father    Rectal cancer Paternal Grandmother    Breast cancer Sister 53   Breast cancer Sister 60  Past Surgical History:  Procedure Laterality Date   ANKLE ARTHROSCOPY Right 09/27/2023   Procedure: RIGHT ANKLE ARTHROSCOPY AND DEBRIDEMENT;  Surgeon: Nadara Mustard, MD;  Location: [facility] OR;  Service: Orthopedics;  Laterality: Right;   ANUS SURGERY  2005   "biopsy"   BREAST CYST EXCISION Right 1966   DILATION AND CURETTAGE OF UTERUS  1980's   S/P miscarriage   EXTERNAL FIXATION LEG Right 03/10/2019   Procedure: OPEN REDUCTION INTERNAL FIXATION RIGHT ANKLE;  Surgeon: Teryl Lucy, MD;  Location: [facility] OR;  Service: Orthopedics;  Laterality: Right;   HARDWARE REMOVAL Right 02/05/2021   Procedure: REMOVAL OF HARDWARE RIGHT ANKLE;  Surgeon: Nadara Mustard,  MD;  Location: [facility] OR;  Service: Orthopedics;  Laterality: Right;   VARICOSE VEIN SURGERY Left    left leg   VIDEO BRONCHOSCOPY WITH ENDOBRONCHIAL NAVIGATION Right 08/25/2020   Procedure: VIDEO BRONCHOSCOPY WITH ENDOBRONCHIAL NAVIGATION WITH FIDUCIAL PLACEMENT;  Surgeon: Josephine Igo, DO;  Location: [facility] OR;  Service: Pulmonary;  Laterality: Right;   Social History   Occupational History   Occupation: Violinist  Tobacco Use   Smoking status: Former    Current packs/day: 0.00    Average packs/day: 1 pack/day for 30.0 years (30.0 ttl pk-yrs)    Types: Cigarettes    Start date: 64    Quit date: 2005    Years since quitting: 19.8   Smokeless tobacco: Never  Vaping Use   Vaping status: Never Used  Substance and Sexual Activity   Alcohol use: No   Drug use: No   Sexual activity: Not Currently    Partners: Male    Birth control/protection: Post-menopausal

## 2023-10-11 ENCOUNTER — Other Ambulatory Visit (HOSPITAL_COMMUNITY): Payer: Self-pay

## 2023-10-11 ENCOUNTER — Other Ambulatory Visit: Payer: Self-pay | Admitting: Family Medicine

## 2023-10-11 ENCOUNTER — Telehealth: Payer: Self-pay | Admitting: Orthopedic Surgery

## 2023-10-11 MED ORDER — LORAZEPAM 1 MG PO TABS
0.5000 mg | ORAL_TABLET | ORAL | 0 refills | Status: DC | PRN
  Filled 2023-10-11: qty 30, 30d supply, fill #0

## 2023-10-11 NOTE — Telephone Encounter (Signed)
Patient called and said she needs a refill on lorazepam. CB#[phone]

## 2023-10-11 NOTE — Telephone Encounter (Signed)
Notified pt that we do not manage or prescribe lorazepam, this is something she needs to get from her PCP. She said she had asked them for days now and got frustrated and called [place] to see if we can fill it. When I called her back she stated that it was sent to her pharmacy by her PCP.

## 2023-10-16 ENCOUNTER — Telehealth: Payer: Self-pay | Admitting: Orthopedic Surgery

## 2023-10-16 NOTE — Telephone Encounter (Signed)
Requesting medication refill of Ibuprofen 600mg . PO patient she stated the pain is becoming worse by the day. Requesting an sooner appt. Best cb [phone]

## 2023-10-17 ENCOUNTER — Other Ambulatory Visit (HOSPITAL_COMMUNITY): Payer: Self-pay

## 2023-10-17 ENCOUNTER — Encounter (HOSPITAL_COMMUNITY): Payer: Self-pay

## 2023-10-17 NOTE — Telephone Encounter (Signed)
SW pt, I have her scheduled on 10/23/23. She said it is not an emergency and waiting next week is okay.

## 2023-10-19 DIAGNOSIS — M545 Low back pain, unspecified: Secondary | ICD-10-CM | POA: Diagnosis not present

## 2023-10-23 ENCOUNTER — Ambulatory Visit (INDEPENDENT_AMBULATORY_CARE_PROVIDER_SITE_OTHER): Payer: Medicare Other | Admitting: Orthopedic Surgery

## 2023-10-23 DIAGNOSIS — M12571 Traumatic arthropathy, right ankle and foot: Secondary | ICD-10-CM

## 2023-10-24 ENCOUNTER — Encounter: Payer: Self-pay | Admitting: Orthopedic Surgery

## 2023-10-24 ENCOUNTER — Other Ambulatory Visit: Payer: Self-pay | Admitting: Orthopedic Surgery

## 2023-10-24 ENCOUNTER — Other Ambulatory Visit (HOSPITAL_COMMUNITY): Payer: Self-pay

## 2023-10-24 ENCOUNTER — Telehealth: Payer: Self-pay | Admitting: Orthopedic Surgery

## 2023-10-24 MED ORDER — IBUPROFEN 600 MG PO TABS
600.0000 mg | ORAL_TABLET | Freq: Three times a day (TID) | ORAL | 1 refills | Status: DC | PRN
  Filled 2023-10-24: qty 90, 30d supply, fill #0
  Filled 2024-04-29 – 2024-09-02 (×2): qty 90, 30d supply, fill #1

## 2023-10-24 NOTE — Telephone Encounter (Signed)
Pt in office yesterday s/p ankle scope and debridement and requesting rx for Ibuprofen 600 mg

## 2023-10-24 NOTE — Progress Notes (Signed)
Office Visit Note   Patient: Julie Mann           Date of Birth: [birth year]           MRN: [id number] Visit Date: 10/23/2023              Requested by: Irven Coe, MD [address] [place],  [place] [address] PCP: Irven Coe, MD  Chief Complaint  Patient presents with   Right Ankle - Routine Post Op    09/27/2023 right ankle scope and debridement       HPI: Patient is a 77 year old woman who presents 4 weeks status post arthroscopic debridement traumatic arthritis right ankle.  Patient states she still has pain.  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  Assessment & Plan: Visit Diagnoses:  1. Traumatic arthritis of right ankle     Plan: Continue conservative treatment increase activities as tolerated.  Discussed that she had delaminated cartilage from the traumatic arthritis and additional surgery may be required including the possibility of a fusion.  Follow-Up Instructions: Return in about 4 weeks (around 11/20/2023).   Ortho Exam  Patient is alert, oriented, no adenopathy, well-dressed, normal affect, normal respiratory effort. Examination there is no redness or cellulitis she does have some swelling.  Patient has pain with weightbearing.  No signs of infection.  There is no crepitation with range of motion.  Imaging: No results found. No images are attached to the encounter.  Labs: Lab Results  Component Value Date   REPTSTATUS 02/10/2021 FINAL 02/05/2021   GRAMSTAIN  02/05/2021    MODERATE WBC PRESENT,BOTH PMN AND MONONUCLEAR NO ORGANISMS SEEN    CULT  02/05/2021    RARE STAPHYLOCOCCUS CAPITIS RARE STAPHYLOCOCCUS EPIDERMIDIS NO ANAEROBES ISOLATED Performed at [facility], [address] [place], [place] [address]    [facility] STAPHYLOCOCCUS CAPITIS 02/05/2021   LABORGA STAPHYLOCOCCUS EPIDERMIDIS 02/05/2021     Lab Results  Component Value Date   ALBUMIN 3.6 09/27/2023   ALBUMIN 3.9 06/15/2023    ALBUMIN 4.3 03/21/2023    No results found for: "MG" No results found for: "VD25OH"  No results found for: "PREALBUMIN"    Latest Ref Rng & Units 10/01/2023   11:36 AM 09/27/2023    7:27 AM 08/17/2023    8:58 AM  CBC EXTENDED  WBC 4.0 - 10.5 K/uL 8.4  8.4  8.3   RBC 3.87 - 5.11 MIL/uL 4.80  4.49  4.30   Hemoglobin 12.0 - 15.0 g/dL 82.9  56.2  13.0   HCT 36.0 - 46.0 % 38.5  35.9  35.3   Platelets 150 - 400 K/uL 424  492  417   NEUT# 1.7 - 7.7 K/uL   6.3   Lymph# 0.7 - 4.0 K/uL   1.1      There is no height or weight on file to calculate BMI.  Orders:  No orders of the defined types were placed in this encounter.  No orders of the defined types were placed in this encounter.    Procedures: No procedures performed  Clinical Data: No additional findings.  ROS:  All other systems negative, except as noted in the HPI. Review of Systems  Objective: Vital Signs: LMP 11/14/1992   Specialty Comments:  EXAM: MRI LUMBAR SPINE WITHOUT CONTRAST   TECHNIQUE: Multiplanar, multisequence MR imaging of the lumbar spine was performed. No intravenous contrast was administered.   COMPARISON:  Lumbar spine MRI 05/30/2012   FINDINGS:  Segmentation:  Standard.   Alignment: Slight lower lumbar levoscoliosis. No significant listhesis.   Vertebrae: No fracture or suspicious marrow lesion. Mild degenerative endplate edema at Y8-M5.   Conus medullaris and cauda equina: Conus extends to the T12 level and is suboptimally evaluated as it was not covered on axial images. Unremarkable appearance of the cauda equina.   Paraspinal and other soft tissues: Small renal cysts.   Disc levels:   Disc desiccation throughout the lumbar spine with exception of L3-4. Progressive, severe disc space narrowing at L5-S1.   L1-2: Negative.   L2-3: At most minimal disc bulging without stenosis.   L3-4: Minimal leftward disc bulging without stenosis.   L4-5: Disc bulging and moderate facet  and ligamentum flavum hypertrophy, mildly progressed but without associated stenosis. Small synovial cysts posterior to the facet joints bilaterally, not in a position to cause neural impingement.   L5-S1: Disc bulging, endplate spurring, disc space height loss, and mild facet hypertrophy without significant stenosis.   IMPRESSION: 1. Mild progression advanced disc degeneration at L5-S1 without stenosis. 2. Mildly progressive facet hypertrophy at L4-5 without stenosis.     Electronically Signed   By: Sebastian Ache M.D.   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   Posterior vitreous detachment of both eyes 07/27/2021   Pseudophakia of right eye 07/27/2021   Pseudophakia of left eye 07/27/2021   Vitreomacular adhesion of right eye 07/27/2021   Macular pucker, right eye 07/27/2021   Hardware complicating wound infection (HCC)    Neck pain 10/05/2020   Nodule of upper lobe of right lung 08/17/2020   Biceps tendonosis of left shoulder 02/13/2020   Impingement syndrome of left shoulder 02/13/2020   Post-operative state 02/13/2020   Ankle dislocation, right, initial encounter 03/10/2019   Trimalleolar fracture of ankle, closed, right, initial encounter    Chronic left shoulder pain 02/19/2019   Anxiety 12/17/2016   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HCC) 12/17/2016   Osteopenia 12/17/2016   Paroxysmal atrial fibrillation (HCC) 12/17/2016   Pure hypercholesterolemia 12/17/2016   Rectal cancer (HCC) 07/30/2015   Past Medical History:  Diagnosis Date   Anal cancer (HCC) 2005   SCCa anus-stage II chemo, radiation   Anxiety    Arthritis    "hands, fingers, back" (02/27/2014)   Atrial fibrillation (HCC)     Cat scratch of right hand 02/24/2014   COPD (chronic obstructive pulmonary disease) (HCC)    Dyspnea    walking up hill;.  stairs (If Iam tired)   Dysrhythmia    PAF   Fall from horse 01/2018   Fracture, ankle 02/2019   Right    High cholesterol    History of stomach ulcers ~ 1966   Osteopenia     Family History  Problem Relation Age of Onset   Breast cancer Maternal Aunt    Breast cancer Maternal Grandmother    Heart disease Mother    Stroke Father    Rectal cancer Paternal Grandmother    Breast cancer Sister 73   Breast cancer Sister 8    Past Surgical History:  Procedure Laterality Date   ANKLE ARTHROSCOPY Right 09/27/2023   Procedure: RIGHT ANKLE ARTHROSCOPY AND DEBRIDEMENT;  Surgeon: Nadara Mustard, MD;  Location: [facility] OR;  Service: Orthopedics;  Laterality: Right;   ANUS SURGERY  2005   "biopsy"   BREAST CYST EXCISION Right 1966   DILATION AND CURETTAGE OF UTERUS  1980's   S/P miscarriage   EXTERNAL FIXATION LEG Right 03/10/2019   Procedure: OPEN REDUCTION INTERNAL FIXATION RIGHT ANKLE;  Surgeon: Teryl Lucy, MD;  Location: [facility] OR;  Service: Orthopedics;  Laterality: Right;   HARDWARE REMOVAL Right 02/05/2021   Procedure: REMOVAL OF HARDWARE RIGHT ANKLE;  Surgeon: Nadara Mustard, MD;  Location: [facility] OR;  Service: Orthopedics;  Laterality: Right;   VARICOSE VEIN SURGERY Left    left leg   VIDEO BRONCHOSCOPY WITH ENDOBRONCHIAL NAVIGATION Right 08/25/2020   Procedure: VIDEO BRONCHOSCOPY WITH ENDOBRONCHIAL NAVIGATION WITH FIDUCIAL PLACEMENT;  Surgeon: Josephine Igo, DO;  Location: [facility] OR;  Service: Pulmonary;  Laterality: Right;   Social History   Occupational History   Occupation: Violinist  Tobacco Use   Smoking status: Former    Current packs/day: 0.00    Average packs/day: 1 pack/day for 30.0 years (30.0 ttl pk-yrs)    Types: Cigarettes    Start date: 36    Quit date: 2005    Years since quitting: 19.9   Smokeless tobacco: Never  Vaping Use   Vaping  status: Never Used  Substance and Sexual Activity   Alcohol use: No   Drug use: No   Sexual activity: Not Currently    Partners: Male    Birth control/protection: Post-menopausal

## 2023-10-24 NOTE — Telephone Encounter (Signed)
Pt called requesting a refill of ibuprofen 600 m,g. Please send to on file. [facility] pharmacy. Pt phone number is [phone]

## 2023-10-26 DIAGNOSIS — M545 Low back pain, unspecified: Secondary | ICD-10-CM | POA: Diagnosis not present

## 2023-10-31 DIAGNOSIS — M545 Low back pain, unspecified: Secondary | ICD-10-CM | POA: Diagnosis not present

## 2023-11-02 ENCOUNTER — Encounter: Payer: Medicare Other | Admitting: Orthopedic Surgery

## 2023-11-06 ENCOUNTER — Other Ambulatory Visit (HOSPITAL_COMMUNITY): Payer: Self-pay

## 2023-11-07 ENCOUNTER — Other Ambulatory Visit (HOSPITAL_COMMUNITY): Payer: Self-pay

## 2023-11-07 MED ORDER — LORAZEPAM 1 MG PO TABS
0.5000 mg | ORAL_TABLET | ORAL | 0 refills | Status: DC | PRN
  Filled 2023-11-07: qty 30, 30d supply, fill #0

## 2023-11-09 DIAGNOSIS — H35373 Puckering of macula, bilateral: Secondary | ICD-10-CM | POA: Diagnosis not present

## 2023-11-09 DIAGNOSIS — H01002 Unspecified blepharitis right lower eyelid: Secondary | ICD-10-CM | POA: Diagnosis not present

## 2023-11-09 DIAGNOSIS — H01005 Unspecified blepharitis left lower eyelid: Secondary | ICD-10-CM | POA: Diagnosis not present

## 2023-11-09 DIAGNOSIS — Z961 Presence of intraocular lens: Secondary | ICD-10-CM | POA: Diagnosis not present

## 2023-11-09 DIAGNOSIS — H52203 Unspecified astigmatism, bilateral: Secondary | ICD-10-CM | POA: Diagnosis not present

## 2023-11-10 DIAGNOSIS — M545 Low back pain, unspecified: Secondary | ICD-10-CM | POA: Diagnosis not present

## 2023-11-20 ENCOUNTER — Ambulatory Visit: Payer: Medicare Other | Admitting: Orthopedic Surgery

## 2023-12-04 ENCOUNTER — Encounter: Payer: Self-pay | Admitting: Podiatry

## 2023-12-04 ENCOUNTER — Ambulatory Visit: Payer: Medicare Other | Admitting: Podiatry

## 2023-12-04 ENCOUNTER — Other Ambulatory Visit (HOSPITAL_COMMUNITY): Payer: Self-pay

## 2023-12-04 DIAGNOSIS — M19071 Primary osteoarthritis, right ankle and foot: Secondary | ICD-10-CM | POA: Diagnosis not present

## 2023-12-04 NOTE — Patient Instructions (Signed)
For instructions on how to put on your Tri-Lock Ankle Brace, please visit [email or web address] 

## 2023-12-05 ENCOUNTER — Other Ambulatory Visit (HOSPITAL_COMMUNITY): Payer: Self-pay

## 2023-12-05 MED ORDER — LORAZEPAM 1 MG PO TABS
0.5000 mg | ORAL_TABLET | Freq: Every day | ORAL | 0 refills | Status: DC | PRN
  Filled 2023-12-05: qty 30, 30d supply, fill #0

## 2023-12-06 NOTE — Progress Notes (Signed)
  Subjective:  Patient ID: Julie Mann, female    DOB: 01-12-1946,  MRN: [id number]  Chief Complaint  Patient presents with   Foot Pain    RM#11 right foot follow up patient wants to discuss with provider.     78 year old female presents the office today for follow-up evaluation of right ankle pain.  Since I saw her last she did undergo ankle arthroscopy with Dr. Lajoyce Corners.  She said that she still having pain to her ankle.  She does not report any recent injury or changes.  She states the area is tender to walk and she is fearful that the ankle is going to give out.   Objective:    Physical Exam    General: AAO x3, NAD  Dermatological: Skin is warm, dry and supple bilateral. There are no open sores, no preulcerative lesions, no rash or signs of infection present.  Vascular: Dorsalis Pedis artery and Posterior Tibial artery pedal pulses are 2/4 bilateral with immedate capillary fill time. There is no pain with calf compression, swelling, warmth, erythema.  She does have some varicosities.  Neruologic: Grossly intact via light touch bilateral.   Musculoskeletal: There is tenderness to palpation on the anterior ankle joint line most of the anteromedial aspect of the ankle gutter of the tenderness is more globally around the ankle.  There are some slight edema but there is no erythema or warmth.  Gait: Unassisted, Nonantalgic.   No images are attached to the encounter.    Results          Assessment:   Arthritis right ankle  Plan:  Patient was evaluated and treated and all questions answered.  Assessment and Plan          Post-traumatic Ankle Arthritis -Despite arthroscopy she still having quite a bit of discomfort.  We had a good conversation regards to the options of conservative as well as surgical.  We discussed bracing options, ASO.  Given that she wants to consider surgery but we discussed options for this.  I ordered MRI to further evaluate her ankle.   Return for  MRI results once complete .  Vivi Barrack DPM

## 2023-12-08 ENCOUNTER — Ambulatory Visit: Payer: Medicare Other | Admitting: Podiatry

## 2023-12-17 ENCOUNTER — Ambulatory Visit
Admission: RE | Admit: 2023-12-17 | Discharge: 2023-12-17 | Disposition: A | Discharge status: Home / Self Care | Payer: Medicare Other | Source: Ambulatory Visit | Attending: Podiatry | Admitting: Podiatry

## 2023-12-17 DIAGNOSIS — M25571 Pain in right ankle and joints of right foot: Secondary | ICD-10-CM | POA: Diagnosis not present

## 2023-12-17 DIAGNOSIS — J209 Acute bronchitis, unspecified: Secondary | ICD-10-CM | POA: Diagnosis not present

## 2023-12-17 DIAGNOSIS — M19071 Primary osteoarthritis, right ankle and foot: Secondary | ICD-10-CM

## 2023-12-26 ENCOUNTER — Encounter: Payer: Self-pay | Admitting: Podiatry

## 2023-12-26 ENCOUNTER — Other Ambulatory Visit (HOSPITAL_COMMUNITY): Payer: Self-pay

## 2023-12-26 DIAGNOSIS — T50905A Adverse effect of unspecified drugs, medicaments and biological substances, initial encounter: Secondary | ICD-10-CM | POA: Diagnosis not present

## 2023-12-26 DIAGNOSIS — J208 Acute bronchitis due to other specified organisms: Secondary | ICD-10-CM | POA: Diagnosis not present

## 2023-12-26 MED ORDER — ONDANSETRON 4 MG PO TBDP
4.0000 mg | ORAL_TABLET | Freq: Three times a day (TID) | ORAL | 0 refills | Status: DC | PRN
  Filled 2023-12-26: qty 9, 3d supply, fill #0

## 2023-12-28 ENCOUNTER — Other Ambulatory Visit (HOSPITAL_COMMUNITY): Payer: Self-pay

## 2023-12-29 ENCOUNTER — Encounter (HOSPITAL_COMMUNITY): Payer: Self-pay

## 2023-12-29 ENCOUNTER — Other Ambulatory Visit (HOSPITAL_COMMUNITY): Payer: Self-pay

## 2024-01-01 ENCOUNTER — Other Ambulatory Visit: Payer: Self-pay | Admitting: Family Medicine

## 2024-01-01 ENCOUNTER — Other Ambulatory Visit (HOSPITAL_COMMUNITY): Payer: Self-pay

## 2024-01-01 DIAGNOSIS — Z1231 Encounter for screening mammogram for malignant neoplasm of breast: Secondary | ICD-10-CM

## 2024-01-01 DIAGNOSIS — I700 Atherosclerosis of aorta: Secondary | ICD-10-CM | POA: Diagnosis not present

## 2024-01-01 DIAGNOSIS — E7800 Pure hypercholesterolemia, unspecified: Secondary | ICD-10-CM | POA: Diagnosis not present

## 2024-01-01 DIAGNOSIS — R7309 Other abnormal glucose: Secondary | ICD-10-CM | POA: Diagnosis not present

## 2024-01-01 DIAGNOSIS — Z1159 Encounter for screening for other viral diseases: Secondary | ICD-10-CM | POA: Diagnosis not present

## 2024-01-01 DIAGNOSIS — I10 Essential (primary) hypertension: Secondary | ICD-10-CM | POA: Diagnosis not present

## 2024-01-01 DIAGNOSIS — I480 Paroxysmal atrial fibrillation: Secondary | ICD-10-CM | POA: Diagnosis not present

## 2024-01-01 DIAGNOSIS — M8589 Other specified disorders of bone density and structure, multiple sites: Secondary | ICD-10-CM

## 2024-01-01 DIAGNOSIS — G3184 Mild cognitive impairment, so stated: Secondary | ICD-10-CM | POA: Diagnosis not present

## 2024-01-01 DIAGNOSIS — R911 Solitary pulmonary nodule: Secondary | ICD-10-CM

## 2024-01-01 DIAGNOSIS — I350 Nonrheumatic aortic (valve) stenosis: Secondary | ICD-10-CM | POA: Diagnosis not present

## 2024-01-01 DIAGNOSIS — Z0000 Encounter for general adult medical examination without abnormal findings: Secondary | ICD-10-CM | POA: Diagnosis not present

## 2024-01-01 DIAGNOSIS — F5101 Primary insomnia: Secondary | ICD-10-CM | POA: Diagnosis not present

## 2024-01-01 DIAGNOSIS — M8588 Other specified disorders of bone density and structure, other site: Secondary | ICD-10-CM | POA: Diagnosis not present

## 2024-01-01 MED ORDER — LORAZEPAM 1 MG PO TABS
0.5000 mg | ORAL_TABLET | ORAL | 0 refills | Status: DC | PRN
  Filled 2024-01-01 – 2024-01-02 (×3): qty 30, 30d supply, fill #0

## 2024-01-02 ENCOUNTER — Other Ambulatory Visit (HOSPITAL_COMMUNITY): Payer: Self-pay

## 2024-01-02 ENCOUNTER — Ambulatory Visit: Payer: Medicare Other | Attending: Internal Medicine | Admitting: Internal Medicine

## 2024-01-02 ENCOUNTER — Encounter: Payer: Self-pay | Admitting: Internal Medicine

## 2024-01-02 VITALS — BP 158/78 | HR 70 | Ht 66.5 in | Wt 121.0 lb

## 2024-01-02 DIAGNOSIS — Z79899 Other long term (current) drug therapy: Secondary | ICD-10-CM

## 2024-01-02 DIAGNOSIS — E7800 Pure hypercholesterolemia, unspecified: Secondary | ICD-10-CM | POA: Diagnosis not present

## 2024-01-02 DIAGNOSIS — D6869 Other thrombophilia: Secondary | ICD-10-CM

## 2024-01-02 DIAGNOSIS — I480 Paroxysmal atrial fibrillation: Secondary | ICD-10-CM

## 2024-01-02 DIAGNOSIS — R011 Cardiac murmur, unspecified: Secondary | ICD-10-CM

## 2024-01-02 MED ORDER — ROSUVASTATIN CALCIUM 5 MG PO TABS
5.0000 mg | ORAL_TABLET | Freq: Every day | ORAL | 3 refills | Status: DC
  Filled 2024-01-02: qty 90, 90d supply, fill #0

## 2024-01-02 NOTE — Progress Notes (Signed)
 Cardiology Office Note:  .   Date:  01/02/2024  ID:  Soren, Pigman 07/17/1946, MRN [id number] PCP: Irven Coe, MD  [facility] Providers Cardiologist:  Parke Poisson, MD    History of Present Illness: .   Julie Mann is a 78 y.o. female.  Discussed the use of AI scribe software for clinical note transcription with the patient, who gave verbal consent to proceed.  History of Present Illness   The patient, with a history of atrial fibrillation, coronary artery disease, and high cholesterol, presents for a routine cardiology follow-up. The patient reports experiencing palpitations, which she manages with as-needed metoprolol. The patient does not take metoprolol daily, but only when she feels palpitations coming on or when she is nervous about an upcoming event. The patient reports that the metoprolol effectively manages her palpitations and she has not experienced an increase in palpitations since her last EKG in November. The patient also reports foot pain, which has been ongoing since a horse-riding accident. The patient does not currently ride horses due to the foot pain. The patient has a history of anal cancer and has been seeing a doctor for follow-up care. She is a Dance movement psychotherapist.        ROS: negative except per HPI above.  Studies Reviewed: .        Results   LABS LDL: 119 mg/dL (40/9811)  RADIOLOGY Chest CT: Coronary artery calcification, coronary artery disease (2023)  DIAGNOSTIC EKG: Normal (09/2023) Echocardiogram: Normal systolic function, normal pressures, normal valves (2018)     Risk Assessment/Calculations:    CHA2DS2-VASc Score = 4   This indicates a 4.8% annual risk of stroke. The patient's score is based upon: CHF History: 0 HTN History: 0 Diabetes History: 0 Stroke History: 0 Vascular Disease History: 1 Age Score: 2 Gender Score: 1           Physical Exam:   VS:  BP (!) 158/78 (BP Location: Right Arm, Patient  Position: Sitting, Cuff Size: Normal)   Pulse 70   Ht 5' 6.5" (1.689 m)   Wt 121 lb (54.9 kg)   LMP 11/14/1992   BMI 19.24 kg/m    Wt Readings from Last 3 Encounters:  01/02/24 121 lb (54.9 kg)  10/01/23 120 lb (54.4 kg)  09/27/23 120 lb (54.4 kg)     Physical Exam   CHEST: Lungs clear to auscultation. CARDIOVASCULAR: Heart with very faint 1/6 systolic murmur.     GEN: Well nourished, well developed in no acute distress NECK: No JVD; No carotid bruits CARDIAC: RRR, faint 1/6 sem RESPIRATORY:  Clear to auscultation without rales, wheezing or rhonchi  ABDOMEN: Soft, non-tender, non-distended EXTREMITIES:  No edema; No deformity   ASSESSMENT AND PLAN: .    Assessment & Plan Pure hypercholesterolemia  Medication management  Paroxysmal atrial fibrillation (HCC)  Secondary hypercoagulable state (HCC)  Systolic murmur   Assessment and Plan    Atrial Fibrillation Palpitations managed with as-needed Metoprolol. No recent EKG showing AFib. Discussed the risk of stroke and the potential need for anticoagulation. Patient declined anticoagulation at this time. -Continue Metoprolol 25mg  as needed for palpitations. -Consider future discussion on anticoagulation if frequency of palpitations increases.  Hyperlipidemia Coronary artery calcifications - 3 vessel severe.  LDL 119 as of February 2024. Discussed the presence of coronary artery calcium and the potential benefit of statin therapy to reduce risk of heart attack. -Start Rosuvastatin 5mg  daily to lower LDL and stabilize coronary plaques. -Recheck lipid  panel in 3 months.  Heart Murmur Detected a faint murmur on auscultation, likely due to sclerosis of aortic valve. No immediate concern. -Plan to monitor clinically and consider echocardiogram in the next 1-2 years.  General Health Maintenance -Continue current management of past medical history including COPD and cancer. -Follow-up with gastroenterologist for colonoscopy.

## 2024-01-02 NOTE — Patient Instructions (Signed)
Medication Instructions:  Stop: Atorvastatin (Lipitor) Start: Rosuvastatin (Crestor) 5 mg once daily *If you need a refill on your cardiac medications before your next appointment, please call your pharmacy*   Lab Work: Lipid Panel and Liver Panel (FASTING) in 3 months, after starting the Crestor. If you have labs (blood work) drawn today and your tests are completely normal, you will receive your results only by: MyChart Message (if you have MyChart) OR A paper copy in the mail If you have any lab test that is abnormal or we need to change your treatment, we will call you to review the results.   Testing/Procedures: None   Follow-Up: At [facility], you and your health needs are our priority.  As part of our continuing mission to provide you with exceptional heart care, we have created designated Provider Care Teams.  These Care Teams include your primary Cardiologist (physician) and Advanced Practice Providers (APPs -  Physician Assistants and Nurse Practitioners) who all work together to provide you with the care you need, when you need it.   Your next appointment:   1 year(s)  Provider:   Parke Poisson, MD

## 2024-01-03 ENCOUNTER — Ambulatory Visit
Admission: RE | Admit: 2024-01-03 | Discharge: 2024-01-03 | Disposition: A | Discharge status: Home / Self Care | Payer: Medicare Other | Source: Ambulatory Visit | Attending: Family Medicine | Admitting: Family Medicine

## 2024-01-03 DIAGNOSIS — Z1231 Encounter for screening mammogram for malignant neoplasm of breast: Secondary | ICD-10-CM | POA: Diagnosis not present

## 2024-01-05 ENCOUNTER — Other Ambulatory Visit (HOSPITAL_COMMUNITY): Payer: Self-pay

## 2024-01-05 ENCOUNTER — Ambulatory Visit: Payer: Medicare Other | Admitting: Podiatry

## 2024-01-05 ENCOUNTER — Encounter: Payer: Self-pay | Admitting: Podiatry

## 2024-01-05 DIAGNOSIS — K5900 Constipation, unspecified: Secondary | ICD-10-CM | POA: Diagnosis not present

## 2024-01-05 DIAGNOSIS — M19071 Primary osteoarthritis, right ankle and foot: Secondary | ICD-10-CM

## 2024-01-05 DIAGNOSIS — K624 Stenosis of anus and rectum: Secondary | ICD-10-CM | POA: Diagnosis not present

## 2024-01-05 DIAGNOSIS — R198 Other specified symptoms and signs involving the digestive system and abdomen: Secondary | ICD-10-CM | POA: Diagnosis not present

## 2024-01-05 MED ORDER — EZETIMIBE 10 MG PO TABS
10.0000 mg | ORAL_TABLET | Freq: Every day | ORAL | 3 refills | Status: DC
Start: 2024-01-05 — End: 2024-08-14
  Filled 2024-01-05: qty 90, 90d supply, fill #0
  Filled 2024-08-06: qty 90, 90d supply, fill #1

## 2024-01-05 NOTE — Progress Notes (Signed)
  Subjective:  Patient ID: Julie Mann, female    DOB: Oct 29, 1946,  MRN: [id number]  Chief Complaint  Patient presents with   Foot Pain    RM#12 Right foot pain follow up wants to discuss options.     78 year old female presents the office today for follow-up evaluation of right ankle pain.  Since I saw her last she did undergo ankle arthroscopy with Dr. Lajoyce Corners.  She is continue to have pain to her ankle and MRIs performed and she presents today for further discussion of the results.  She states that she continues to show and she is not able to walk far without pain to her ankle.  She does increase her activity because of the ankle.  She is not diabetic she does not smoke.   Objective:    Physical Exam    General: AAO x3, NAD  Dermatological: Skin is warm, dry and supple bilateral. There are no open sores, no preulcerative lesions, no rash or signs of infection present.  Vascular: Dorsalis Pedis artery and Posterior Tibial artery pedal pulses are 2/4 bilateral with immedate capillary fill time. There is no pain with calf compression, swelling, warmth, erythema.  She does have some varicosities.  Neruologic: Grossly intact via light touch bilateral.   Musculoskeletal: There is tenderness to palpation on the anterior ankle joint line most of the anteromedial aspect of the ankle gutter of the tenderness is more globally around the ankle.  There are some slight edema but there is no erythema or warmth.  Exam unchanged  Gait: Unassisted, Nonantalgic.   No images are attached to the encounter.    Results          Assessment:   Arthritis right ankle  Plan:  Patient was evaluated and treated and all questions answered.  Assessment and Plan     Post-traumatic Ankle Arthritis -Despite arthroscopy she still having quite a bit of discomfort.  We reviewed the MRI.  We discussed both conservative as well as surgical options.  We discussed custom bracing versus ankle arthrodesis  versus TAR. She would like to consider further surgery. I am going to have her follow up with Dr. Sharl Ma for further discussions.   No follow-ups on file.  Vivi Barrack DPM

## 2024-01-09 ENCOUNTER — Encounter: Payer: Self-pay | Admitting: Family Medicine

## 2024-01-09 ENCOUNTER — Telehealth: Payer: Self-pay | Admitting: Podiatry

## 2024-01-09 NOTE — Telephone Encounter (Signed)
 Left message for Julie Mann to call to schedule an appt with Dr Lilian Kapur per Dr Ardelle Anton.

## 2024-01-09 NOTE — Telephone Encounter (Signed)
-----   Message from Vivi Barrack sent at 01/09/2024  2:00 PM EST ----- Sounds good. Dawn, can you schedule this patient with Dr. Lilian Kapur? Thanks! ----- Message ----- From: Edwin Cap, DPM Sent: 01/09/2024  12:19 PM EST To: Vivi Barrack, DPM  Thanks I'll see her first and see how she wants to proceed, the CT will have to be a set protocol for the system I use ----- Message ----- From: Vivi Barrack, DPM Sent: 01/09/2024   5:38 AM EST To: Edwin Cap, DPM  She seems to be ready for surgery. This is the TAR patient we discussed. I was going to schedule a follow up with you but if you wanted me to get a CT or anything prior to her coming in to see you I can order that.

## 2024-01-10 ENCOUNTER — Telehealth: Payer: Self-pay | Admitting: Podiatry

## 2024-01-10 NOTE — Telephone Encounter (Signed)
 Patient waiting on provider to call her about another provider doing her surgery.

## 2024-01-15 ENCOUNTER — Ambulatory Visit: Payer: Medicare Other | Admitting: Podiatry

## 2024-01-22 ENCOUNTER — Ambulatory Visit

## 2024-01-22 ENCOUNTER — Ambulatory Visit: Admitting: Podiatry

## 2024-01-22 ENCOUNTER — Encounter: Payer: Self-pay | Admitting: Podiatry

## 2024-01-22 VITALS — Ht 66.5 in | Wt 121.0 lb

## 2024-01-22 DIAGNOSIS — M19071 Primary osteoarthritis, right ankle and foot: Secondary | ICD-10-CM

## 2024-01-22 NOTE — Progress Notes (Signed)
 Subjective:  Patient ID: Julie Mann, female    DOB: 1946-06-12,  MRN: [id number]  Chief Complaint  Patient presents with   Ankle Pain    Pt is here to discuss surgery options due to right ankle pain.    Discussed the use of AI scribe software for clinical note transcription with the patient, who gave verbal consent to proceed.  History of Present Illness   The patient is a 78year old who presents with persistent ankle pain. She was referred by Dr Ardelle Anton for evaluation of her ankle history.  Approximately three years ago, she sustained a severe fracture and dislocation of the right ankle, which required surgical repair involving ex fix and ORIF with Dr Lajoyce Corners. This initial injury marked the beginning of her ongoing ankle issues.  Two years ago, the metal hardware was removed due to persistent foot pain. Despite this intervention, she continued to experience pain and subsequently underwent an arthroscopy with two incisions on the front of the ankle. She states she was not informed about the extent of the procedure beforehand.  Currently, she experiences significant pain at the site of the arthroscopy incisions, which has worsened since the last procedure. The pain is persistent, primarily in the anterior joint line of the ankle, and affects her ability to walk. She states, 'I haven't been able to walk for a year now.' No pain is reported in the medial ankle where screws are located, and there is no significant pain in the subtalar joint.  She is a Technical sales engineer and a violinist, and her ankle condition impacts her ability to work.  She has a history of intermittent atrial fibrillation, which has not required hospitalization or a pacemaker. She is not on blood thinners.          Objective:    Physical Exam   MUSCULOSKELETAL: Pain on the anterior joint line with limited range of motion of the ankle joints. Subtalar joint motion is intact. No medial ankle pain over the medial  malleolus. SKIN: Callus on the lateral fibula.       No images are attached to the encounter.    Results   RADIOLOGY Ankle MRI: Cartilage damage with OCD on the medial aspect of the talus, synovitis, and bone marrow edema in the anterior aspect of the joint (07/29/2023) Ankle X-ray: Screws on the medial aspect of the ankle and in the posterior aspect of the joint, good alignment, no varus/valgus      Assessment:   1. Arthritis of ankle, right      Plan:  Patient was evaluated and treated and all questions answered.  Assessment and Plan    Post-traumatic ankle arthritis Post-traumatic arthritis with anterior joint line pain, limited range of motion, and inflammation. Imaging shows cartilage damage and joint inflammation. Remaining screws unlikely pain source. Discussed conservative and surgical management options, including potential complications. - Discussed management options: conservative care with [place] brace and steroid injection, ankle arthrodesis, and ankle joint replacement. - Consider steroid injection for inflammation relief. She states she is allergic to steroids but her allergy was blurry vision to PO prednisone and I discussed with her I do not think she would have this type of reaction. - Consider [place] brace for long-term support. - Discussed surgical options: ankle joint replacement and fusion, with benefits and limitations. - Unclear at this point if she is able or willing to proceed with the recovery process as well as the possible risks, she will consider options and let me know how  she would like to proceed. - Provided written summary of treatment options.       A total of 45 minutes was spent today on the review of the patients medical record including previous laboratory values, imaging studies, taking of the history, examining the patient, the ordering of procedures/labs/tests/medications, reading of prior operative reports, and documentation in the  chart.      Return if symptoms worsen or fail to improve.

## 2024-01-22 NOTE — Patient Instructions (Signed)
 VISIT SUMMARY:  Today, we discussed your persistent ankle pain, which has been ongoing since your severe fracture and dislocation three years ago. We reviewed your history of surgeries and current symptoms, and we also touched on your intermittent atrial fibrillation.  YOUR PLAN:  -POST-TRAUMATIC ANKLE ARTHRITIS: Post-traumatic ankle arthritis is a condition where the ankle joint becomes inflamed and damaged following an injury. We discussed both conservative and surgical management options. Conservative options include using an [place] brace for support and considering a steroid injection to reduce inflammation. Surgical options include ankle joint replacement or fusion, each with its own benefits and limitations. Ankle replacement surgery would mean 4 weeks with no weight on the foot, 4-6 weeks walking in a boot after that and physical therapy throughout. It will take 2-4 months total to recover from. It has risks of infection, and failure of the implant or need to revise the surgery if it fails   INSTRUCTIONS:  Please let me know which way you would proceed (brace or ankle replacement surgery)

## 2024-01-25 DIAGNOSIS — M25531 Pain in right wrist: Secondary | ICD-10-CM | POA: Diagnosis not present

## 2024-01-26 ENCOUNTER — Other Ambulatory Visit: Payer: Medicare Other

## 2024-01-29 ENCOUNTER — Other Ambulatory Visit (HOSPITAL_COMMUNITY): Payer: Self-pay

## 2024-01-29 MED ORDER — LORAZEPAM 1 MG PO TABS
0.5000 mg | ORAL_TABLET | Freq: Every day | ORAL | 0 refills | Status: DC | PRN
  Filled 2024-01-30: qty 30, 30d supply, fill #0

## 2024-01-30 ENCOUNTER — Other Ambulatory Visit (HOSPITAL_COMMUNITY): Payer: Self-pay

## 2024-02-05 ENCOUNTER — Other Ambulatory Visit (HOSPITAL_COMMUNITY): Payer: Self-pay

## 2024-02-05 DIAGNOSIS — L258 Unspecified contact dermatitis due to other agents: Secondary | ICD-10-CM | POA: Diagnosis not present

## 2024-02-05 DIAGNOSIS — L603 Nail dystrophy: Secondary | ICD-10-CM | POA: Diagnosis not present

## 2024-02-05 DIAGNOSIS — L650 Telogen effluvium: Secondary | ICD-10-CM | POA: Diagnosis not present

## 2024-02-05 MED ORDER — CLOBETASOL PROPIONATE 0.05 % EX OINT
1.0000 | TOPICAL_OINTMENT | Freq: Two times a day (BID) | CUTANEOUS | 3 refills | Status: DC
  Filled 2024-02-05: qty 60, 30d supply, fill #0
  Filled 2024-08-06: qty 60, 30d supply, fill #1

## 2024-02-13 ENCOUNTER — Other Ambulatory Visit (HOSPITAL_COMMUNITY): Payer: Self-pay

## 2024-02-13 MED ORDER — IBUPROFEN 600 MG PO TABS
600.0000 mg | ORAL_TABLET | ORAL | 1 refills | Status: DC | PRN
  Filled 2024-02-13: qty 30, 10d supply, fill #0

## 2024-02-14 ENCOUNTER — Inpatient Hospital Stay: Payer: Medicare Other | Attending: Internal Medicine | Admitting: Internal Medicine

## 2024-02-14 ENCOUNTER — Inpatient Hospital Stay

## 2024-02-14 ENCOUNTER — Telehealth: Payer: Self-pay

## 2024-02-14 ENCOUNTER — Inpatient Hospital Stay: Payer: Medicare Other

## 2024-02-14 VITALS — BP 154/70 | HR 77 | Temp 97.6°F | Resp 18 | Wt 121.6 lb

## 2024-02-14 DIAGNOSIS — Z885 Allergy status to narcotic agent status: Secondary | ICD-10-CM | POA: Insufficient documentation

## 2024-02-14 DIAGNOSIS — G8929 Other chronic pain: Secondary | ICD-10-CM | POA: Diagnosis not present

## 2024-02-14 DIAGNOSIS — D649 Anemia, unspecified: Secondary | ICD-10-CM | POA: Diagnosis not present

## 2024-02-14 DIAGNOSIS — R5383 Other fatigue: Secondary | ICD-10-CM | POA: Diagnosis not present

## 2024-02-14 DIAGNOSIS — D75838 Other thrombocytosis: Secondary | ICD-10-CM | POA: Insufficient documentation

## 2024-02-14 DIAGNOSIS — E7800 Pure hypercholesterolemia, unspecified: Secondary | ICD-10-CM | POA: Diagnosis not present

## 2024-02-14 DIAGNOSIS — M2550 Pain in unspecified joint: Secondary | ICD-10-CM | POA: Insufficient documentation

## 2024-02-14 DIAGNOSIS — Z91041 Radiographic dye allergy status: Secondary | ICD-10-CM | POA: Diagnosis not present

## 2024-02-14 DIAGNOSIS — M79671 Pain in right foot: Secondary | ICD-10-CM | POA: Diagnosis not present

## 2024-02-14 DIAGNOSIS — D75839 Thrombocytosis, unspecified: Secondary | ICD-10-CM

## 2024-02-14 DIAGNOSIS — F419 Anxiety disorder, unspecified: Secondary | ICD-10-CM | POA: Insufficient documentation

## 2024-02-14 DIAGNOSIS — Z888 Allergy status to other drugs, medicaments and biological substances status: Secondary | ICD-10-CM | POA: Insufficient documentation

## 2024-02-14 DIAGNOSIS — Z79899 Other long term (current) drug therapy: Secondary | ICD-10-CM | POA: Insufficient documentation

## 2024-02-14 DIAGNOSIS — M8580 Other specified disorders of bone density and structure, unspecified site: Secondary | ICD-10-CM | POA: Diagnosis not present

## 2024-02-14 DIAGNOSIS — I480 Paroxysmal atrial fibrillation: Secondary | ICD-10-CM | POA: Diagnosis not present

## 2024-02-14 DIAGNOSIS — Z881 Allergy status to other antibiotic agents status: Secondary | ICD-10-CM | POA: Diagnosis not present

## 2024-02-14 LAB — CBC WITH DIFFERENTIAL (CANCER CENTER ONLY)
Abs Immature Granulocytes: 0.03 10*3/uL (ref 0.00–0.07)
Basophils Absolute: 0.1 10*3/uL (ref 0.0–0.1)
Basophils Relative: 1 %
Eosinophils Absolute: 0.1 10*3/uL (ref 0.0–0.5)
Eosinophils Relative: 2 %
HCT: 40 % (ref 36.0–46.0)
Hemoglobin: 13 g/dL (ref 12.0–15.0)
Immature Granulocytes: 0 %
Lymphocytes Relative: 15 %
Lymphs Abs: 1.2 10*3/uL (ref 0.7–4.0)
MCH: 26.2 pg (ref 26.0–34.0)
MCHC: 32.5 g/dL (ref 30.0–36.0)
MCV: 80.5 fL (ref 80.0–100.0)
Monocytes Absolute: 0.4 10*3/uL (ref 0.1–1.0)
Monocytes Relative: 5 %
Neutro Abs: 6.4 10*3/uL (ref 1.7–7.7)
Neutrophils Relative %: 77 %
Platelet Count: 407 10*3/uL — ABNORMAL HIGH (ref 150–400)
RBC: 4.97 MIL/uL (ref 3.87–5.11)
RDW: 15.8 % — ABNORMAL HIGH (ref 11.5–15.5)
WBC Count: 8.2 10*3/uL (ref 4.0–10.5)
nRBC: 0 % (ref 0.0–0.2)

## 2024-02-14 LAB — CMP (CANCER CENTER ONLY)
ALT: 8 U/L (ref 0–44)
AST: 18 U/L (ref 15–41)
Albumin: 4.5 g/dL (ref 3.5–5.0)
Alkaline Phosphatase: 70 U/L (ref 38–126)
Anion gap: 7 (ref 5–15)
BUN: 11 mg/dL (ref 8–23)
CO2: 26 mmol/L (ref 22–32)
Calcium: 9.8 mg/dL (ref 8.9–10.3)
Chloride: 101 mmol/L (ref 98–111)
Creatinine: 0.9 mg/dL (ref 0.44–1.00)
GFR, Estimated: >60 mL/min (ref >60)
Glucose, Bld: 130 mg/dL — ABNORMAL HIGH (ref 70–99)
Potassium: 4 mmol/L (ref 3.5–5.1)
Sodium: 134 mmol/L — ABNORMAL LOW (ref 135–145)
Total Bilirubin: 0.4 mg/dL (ref 0.0–1.2)
Total Protein: 7.7 g/dL (ref 6.5–8.1)

## 2024-02-14 LAB — VITAMIN B12: Vitamin B-12: 193 pg/mL (ref 180–914)

## 2024-02-14 LAB — IRON AND IRON BINDING CAPACITY (CC-WL,HP ONLY)
Iron: 87 ug/dL (ref 28–170)
Saturation Ratios: 21 % (ref 10.4–31.8)
TIBC: 417 ug/dL (ref 250–450)
UIBC: 330 ug/dL (ref 148–442)

## 2024-02-14 LAB — LACTATE DEHYDROGENASE: LDH: 150 U/L (ref 98–192)

## 2024-02-14 NOTE — Progress Notes (Signed)
 [facility] [phone]   [phone]  OFFICE PROGRESS NOTE  Irven Coe, MD 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 The patient also has mild anemia.  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 female returns to the clinic today for follow-up visit. Discussed the use of AI scribe software for clinical note transcription with the patient, who gave verbal consent to proceed.  History of Present Illness   Julie Mann is a 78 year old female who presents for evaluation and review of blood work.  She has reactive thrombocytosis, with recent blood work showing a platelet count of 407, which is within the desired range. This condition is being monitored, and she is not currently on any specific medication for it.  Regarding her mild anemia, she has not been taking B12 supplements due to stomach discomfort and is not on any iron supplements. She prefers to manage her condition with natural methods. Her recent blood work shows improvement in her hemoglobin levels, which have increased from 11.4 to 13.0, and her hematocrit is at 40.  She has been experiencing issues with her right foot, which were exacerbated by a previous medical intervention. She is currently receiving acupuncture treatment, which she finds beneficial.        MEDICAL HISTORY: Past Medical History:  Diagnosis Date   Anal cancer (HCC) 2005   SCCa anus-stage II chemo, radiation   Anxiety    Arthritis    "hands, fingers, back" (02/27/2014)   Atrial fibrillation (HCC)    Cat scratch of right hand 02/24/2014   COPD (chronic obstructive pulmonary disease) (HCC)    Dyspnea    walking up hill;.   stairs (If Iam tired)   Dysrhythmia    PAF   Fall from horse 01/2018   Fracture, ankle 02/2019   Right    High cholesterol    History of stomach ulcers ~ 1966   Osteopenia     ALLERGIES:  is allergic to duloxetine, eliquis [apixaban], ivp dye [iodinated contrast media], oxycodone, tiotropium bromide monohydrate, conjugated estrogens, doxycycline, erythromycin, flonase [fluticasone propionate], hydrocodone, keflex [cephalexin], other, paxil [paroxetine hcl], prednisone, provera [medroxyprogesterone acetate], xarelto [rivaroxaban], zithromax [azithromycin], and zoloft [sertraline hcl].  MEDICATIONS:  Current Outpatient Medications  Medication Sig Dispense Refill   aspirin EC 81 MG tablet Take 1 tablet (81 mg total) by mouth daily. (Patient taking differently: Take 81 mg by mouth See admin instructions. Every other day to every few days.) 90 tablet 3   clobetasol ointment (TEMOVATE) 0.05 % Apply 1 Application topically 2 (two) times daily. 60 g 3   cyanocobalamin (VITAMIN B12) 1000 MCG tablet Take 1 tablet (1,000 mcg total) by mouth daily. 30 tablet 2   ezetimibe (ZETIA) 10 MG tablet Take 1 tablet (10 mg total) by mouth daily. 90 tablet 3   ibuprofen (ADVIL) 600 MG tablet Take 1 tablet (600 mg total) by mouth every 8 (eight) hours as needed. 90 tablet 1   ibuprofen (ADVIL) 600 MG tablet Take 1 tablet (600 mg total) by mouth as needed. 30 tablet 1   influenza vaccine adjuvanted (FLUAD QUADRIVALENT) 0.5 ML injection Inject 0.5 mLs into the muscle. 0.5 mL 0   LORazepam (ATIVAN) 1 MG  tablet Take 1 tablet (1 mg total) by mouth at bedtime as needed for anxiety or sleep. 30 tablet 2   LORazepam (ATIVAN) 1 MG tablet Take 1/2-1 tablet (0.5-1 mg total) by mouth as needed. 30 tablet 0   metoprolol succinate (TOPROL XL) 25 MG 24 hr tablet Take 1 tablet (25 mg total) by mouth daily. (Patient taking differently: Take 25 mg by mouth daily as needed (palpitations/AFIB).) 90 tablet 3   naproxen sodium (ALEVE)  220 MG tablet Take 220 mg by mouth daily as needed (Pain).     olmesartan (BENICAR) 20 MG tablet Take 0.5 tablets (10 mg total) by mouth daily. 30 tablet 1   ondansetron (ZOFRAN-ODT) 4 MG disintegrating tablet Dissolve 1 tablet (4 mg total) on the tongue 3 (three) times daily as needed for up to 3 days. (Patient not taking: Reported on 01/22/2024) 9 tablet 0   oxyCODONE-acetaminophen (PERCOCET/ROXICET) 5-325 MG tablet Take 1 tablet by mouth every 4 (four) hours as needed. (Patient not taking: Reported on 01/05/2024) 30 tablet 0   rosuvastatin (CRESTOR) 5 MG tablet Take 1 tablet (5 mg total) by mouth daily. 90 tablet 3   vitamin E 180 MG (400 UNITS) capsule Take 400 Units by mouth daily.     No current facility-administered medications for this visit.    SURGICAL HISTORY:  Past Surgical History:  Procedure Laterality Date   ANKLE ARTHROSCOPY Right 09/27/2023   Procedure: RIGHT ANKLE ARTHROSCOPY AND DEBRIDEMENT;  Surgeon: Nadara Mustard, MD;  Location: [facility] OR;  Service: Orthopedics;  Laterality: Right;   ANUS SURGERY  2005   "biopsy"   BREAST CYST EXCISION Right 1966   DILATION AND CURETTAGE OF UTERUS  1980's   S/P miscarriage   EXTERNAL FIXATION LEG Right 03/10/2019   Procedure: OPEN REDUCTION INTERNAL FIXATION RIGHT ANKLE;  Surgeon: Teryl Lucy, MD;  Location: [facility] OR;  Service: Orthopedics;  Laterality: Right;   HARDWARE REMOVAL Right 02/05/2021   Procedure: REMOVAL OF HARDWARE RIGHT ANKLE;  Surgeon: Nadara Mustard, MD;  Location: [facility];  Service: Orthopedics;  Laterality: Right;   VARICOSE VEIN SURGERY Left    left leg   VIDEO BRONCHOSCOPY WITH ENDOBRONCHIAL NAVIGATION Right 08/25/2020   Procedure: VIDEO BRONCHOSCOPY WITH ENDOBRONCHIAL NAVIGATION WITH FIDUCIAL PLACEMENT;  Surgeon: Josephine Igo, DO;  Location: [facility] OR;  Service: Pulmonary;  Laterality: Right;    REVIEW OF SYSTEMS:  A comprehensive review of systems was negative except for: Constitutional: positive for  fatigue Musculoskeletal: positive for arthralgias   PHYSICAL EXAMINATION: General appearance: alert, cooperative, fatigued, and no distress Head: Normocephalic, without obvious abnormality, atraumatic Neck: no adenopathy, no JVD, supple, symmetrical, trachea midline, and thyroid not enlarged, symmetric, no tenderness/mass/nodules Lymph nodes: Cervical, supraclavicular, and axillary nodes normal. Resp: clear to auscultation bilaterally Back: symmetric, no curvature. ROM normal. No CVA tenderness. Cardio: regular rate and rhythm, S1, S2 normal, no murmur, click, rub or gallop GI: soft, non-tender; bowel sounds normal; no masses,  no organomegaly Extremities: extremities normal, atraumatic, no cyanosis or edema  ECOG PERFORMANCE STATUS: 1 - Symptomatic but completely ambulatory  Blood pressure (!) 154/70, pulse 77, temperature 97.6 F (36.4 C), temperature source Tympanic, resp. rate 18, weight 121 lb 9.6 oz (55.2 kg), last menstrual period 11/14/1992, SpO2 100%.  LABORATORY DATA: Lab Results  Component Value Date   WBC 8.2 02/14/2024   HGB 13.0 02/14/2024   HCT 40.0 02/14/2024   MCV 80.5 02/14/2024   PLT 407 (H) 02/14/2024      Chemistry  Component Value Date/Time   NA 134 (L) 02/14/2024 1453   NA 138 12/08/2022 1038   K 4.0 02/14/2024 1453   CL 101 02/14/2024 1453   CO2 26 02/14/2024 1453   BUN 11 02/14/2024 1453   BUN 16 12/08/2022 1038   CREATININE 0.90 02/14/2024 1453      Component Value Date/Time   CALCIUM 9.8 02/14/2024 1453   ALKPHOS 70 02/14/2024 1453   AST 18 02/14/2024 1453   ALT 8 02/14/2024 1453   BILITOT 0.4 02/14/2024 1453       RADIOGRAPHIC STUDIES: No results found.  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 - Continue current management and monitoring of  platelet count - Follow up with blood work in six months  Mild anemia Mild anemia with hemoglobin levels improved from 11.4 g/dL to 21.3 g/dL, now within normal range. She avoids B12 and iron supplements due to gastrointestinal side effects, opting for natural management. - Continue current management without B12 or iron supplements - Monitor hemoglobin levels with follow-up blood work in six months  Foot pain Chronic right foot pain, previously treated unsuccessfully by another physician. Acupuncture treatment is currently providing relief. - Continue acupuncture treatment for foot pain   The patient was advised to call immediately if she has any concerning symptoms in the interval.  The patient voices understanding of current disease status and treatment options and is in agreement with the current care plan.  All questions were answered. The patient knows to call the clinic with any problems, questions or concerns. We can certainly see the patient much sooner if necessary.  The total time spent in the appointment was 20 minutes.  Disclaimer: This note was dictated with voice recognition software. Similar sounding words can inadvertently be transcribed and may not be corrected upon review.

## 2024-02-14 NOTE — Telephone Encounter (Signed)
 Spoke with patient in regards to moving up appt today.  Patient agreed for labs at 230 and see Dr. Arbutus Ped afterward.  Patient verbalized understanding.

## 2024-02-14 NOTE — Progress Notes (Deleted)
 [facility] OFFICE PROGRESS NOTE  Irven Coe, MD 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 The patient also has mild anemia.  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   INTERVAL HISTORY: Julie Mann 78 y.o. female returns f clinic today for follow-up visit.  The patient was last seen in clinic on 08/17/2023.  She is followed for thrombocytosis and mild anemia.  From reviewing her last set of labs her ferritin was low at 7 and her iron studies were on the low end of normal.  Since being seen iron supplement?  B12?  The patient previously had JAK2 mutation testing which did not show JAK2, MPL, or CAL R but showed variant of unclear significance.  Therefore she is being monitored closely.  Since last being seen she denies any major changes in her health.  Her energy is ***.  She denies any unusual headaches, itching after hot shower, flushing, vision changes, or dizziness.  She denies any shortness of breath, abnormal bleeding or bruising, early satiety.  Only history?  Up-to-date on colonoscopies?  She is here today for evaluation repeat blood work.  MEDICAL HISTORY: Past Medical History:  Diagnosis Date   Anal cancer (HCC) 2005   SCCa anus-stage II chemo, radiation   Anxiety    Arthritis    "hands, fingers, back" (02/27/2014)   Atrial fibrillation (HCC)    Cat scratch of right hand 02/24/2014   COPD (chronic obstructive pulmonary disease) (HCC)    Dyspnea    walking up hill;.  stairs (If Iam tired)   Dysrhythmia    PAF   Fall from horse 01/2018   Fracture, ankle 02/2019   Right    High cholesterol    History of stomach ulcers ~ 1966   Osteopenia     ALLERGIES:  is allergic to duloxetine, eliquis  [apixaban], ivp dye [iodinated contrast media], oxycodone, tiotropium bromide monohydrate, conjugated estrogens, doxycycline, erythromycin, flonase [fluticasone propionate], hydrocodone, keflex [cephalexin], other, paxil [paroxetine hcl], prednisone, provera [medroxyprogesterone acetate], xarelto [rivaroxaban], zithromax [azithromycin], and zoloft [sertraline hcl].  MEDICATIONS:  Current Outpatient Medications  Medication Sig Dispense Refill   aspirin EC 81 MG tablet Take 1 tablet (81 mg total) by mouth daily. (Patient taking differently: Take 81 mg by mouth See admin instructions. Every other day to every few days.) 90 tablet 3   clobetasol ointment (TEMOVATE) 0.05 % Apply 1 Application topically 2 (two) times daily. 60 g 3   cyanocobalamin (VITAMIN B12) 1000 MCG tablet Take 1 tablet (1,000 mcg total) by mouth daily. 30 tablet 2   ezetimibe (ZETIA) 10 MG tablet Take 1 tablet (10 mg total) by mouth daily. 90 tablet 3   ibuprofen (ADVIL) 600 MG tablet Take 1 tablet (600 mg total) by mouth every 8 (eight) hours as needed. 90 tablet 1   ibuprofen (ADVIL) 600 MG tablet Take 1 tablet (600 mg total) by mouth as needed. 30 tablet 1   influenza vaccine adjuvanted (FLUAD QUADRIVALENT) 0.5 ML injection Inject 0.5 mLs into the muscle. 0.5 mL 0   LORazepam (ATIVAN) 1 MG tablet Take 1 tablet (1 mg total) by mouth at bedtime as needed for anxiety or sleep. 30 tablet 2   LORazepam (ATIVAN) 1 MG tablet Take 1/2-1 tablet (0.5-1 mg  total) by mouth as needed. 30 tablet 0   metoprolol succinate (TOPROL XL) 25 MG 24 hr tablet Take 1 tablet (25 mg total) by mouth daily. (Patient taking differently: Take 25 mg by mouth daily as needed (palpitations/AFIB).) 90 tablet 3   naproxen sodium (ALEVE) 220 MG tablet Take 220 mg by mouth daily as needed (Pain).     olmesartan (BENICAR) 20 MG tablet Take 0.5 tablets (10 mg total) by mouth daily. 30 tablet 1   ondansetron (ZOFRAN-ODT) 4 MG disintegrating tablet Dissolve 1 tablet (4  mg total) on the tongue 3 (three) times daily as needed for up to 3 days. (Patient not taking: Reported on 01/22/2024) 9 tablet 0   oxyCODONE-acetaminophen (PERCOCET/ROXICET) 5-325 MG tablet Take 1 tablet by mouth every 4 (four) hours as needed. (Patient not taking: Reported on 01/05/2024) 30 tablet 0   rosuvastatin (CRESTOR) 5 MG tablet Take 1 tablet (5 mg total) by mouth daily. 90 tablet 3   vitamin E 180 MG (400 UNITS) capsule Take 400 Units by mouth daily.     No current facility-administered medications for this visit.    SURGICAL HISTORY:  Past Surgical History:  Procedure Laterality Date   ANKLE ARTHROSCOPY Right 09/27/2023   Procedure: RIGHT ANKLE ARTHROSCOPY AND DEBRIDEMENT;  Surgeon: Nadara Mustard, MD;  Location: [facility] OR;  Service: Orthopedics;  Laterality: Right;   ANUS SURGERY  2005   "biopsy"   BREAST CYST EXCISION Right 1966   DILATION AND CURETTAGE OF UTERUS  1980's   S/P miscarriage   EXTERNAL FIXATION LEG Right 03/10/2019   Procedure: OPEN REDUCTION INTERNAL FIXATION RIGHT ANKLE;  Surgeon: Teryl Lucy, MD;  Location: [facility] OR;  Service: Orthopedics;  Laterality: Right;   HARDWARE REMOVAL Right 02/05/2021   Procedure: REMOVAL OF HARDWARE RIGHT ANKLE;  Surgeon: Nadara Mustard, MD;  Location[facility] OR;  Service: Orthopedics;  Laterality: Right;   VARICOSE VEIN SURGERY Left    left leg   VIDEO BRONCHOSCOPY WITH ENDOBRONCHIAL NAVIGATION Right 08/25/2020   Procedure: VIDEO BRONCHOSCOPY WITH ENDOBRONCHIAL NAVIGATION WITH FIDUCIAL PLACEMENT;  Surgeon: Josephine Igo, DO;  Location: [facility] OR;  Service: Pulmonary;  Laterality: Right;    REVIEW OF SYSTEMS:   Review of Systems  Constitutional: Negative for appetite change, chills, fatigue, fever and unexpected weight change.  HENT:   Negative for mouth sores, nosebleeds, sore throat and trouble swallowing.   Eyes: Negative for eye problems and icterus.  Respiratory: Negative for cough, hemoptysis, shortness of breath and wheezing.    Cardiovascular: Negative for chest pain and leg swelling.  Gastrointestinal: Negative for abdominal pain, constipation, diarrhea, nausea and vomiting.  Genitourinary: Negative for bladder incontinence, difficulty urinating, dysuria, frequency and hematuria.   Musculoskeletal: Negative for back pain, gait problem, neck pain and neck stiffness.  Skin: Negative for itching and rash.  Neurological: Negative for dizziness, extremity weakness, gait problem, headaches, light-headedness and seizures.  Hematological: Negative for adenopathy. Does not bruise/bleed easily.  Psychiatric/Behavioral: Negative for confusion, depression and sleep disturbance. The patient is not nervous/anxious.     PHYSICAL EXAMINATION:  Last menstrual period 11/14/1992.  ECOG PERFORMANCE STATUS: {CHL ONC ECOG [id number]  Physical Exam  Constitutional: Oriented to person, place, and time and well-developed, well-nourished, and in no distress. No distress.  HENT:  Head: Normocephalic and atraumatic.  Mouth/Throat: Oropharynx is clear and moist. No oropharyngeal exudate.  Eyes: Conjunctivae are normal. Right eye exhibits no discharge. Left eye exhibits no discharge. No scleral icterus.  Neck: Normal range of motion.  Neck supple.  Cardiovascular: Normal rate, regular rhythm, normal heart sounds and intact distal pulses.   Pulmonary/Chest: Effort normal and breath sounds normal. No respiratory distress. No wheezes. No rales.  Abdominal: Soft. Bowel sounds are normal. Exhibits no distension and no mass. There is no tenderness.  Musculoskeletal: Normal range of motion. Exhibits no edema.  Lymphadenopathy:    No cervical adenopathy.  Neurological: Alert and oriented to person, place, and time. Exhibits normal muscle tone. Gait normal. Coordination normal.  Skin: Skin is warm and dry. No rash noted. Not diaphoretic. No erythema. No pallor.  Psychiatric: Mood, memory and judgment normal.  Vitals reviewed.  LABORATORY  DATA: Lab Results  Component Value Date   WBC 8.4 10/01/2023   HGB 11.9 (L) 10/01/2023   HCT 38.5 10/01/2023   MCV 80.2 10/01/2023   PLT 424 (H) 10/01/2023      Chemistry      Component Value Date/Time   NA 134 (L) 10/01/2023 1136   NA 138 12/08/2022 1038   K 4.0 10/01/2023 1136   CL 101 10/01/2023 1136   CO2 22 10/01/2023 1136   BUN 8 10/01/2023 1136   BUN 16 12/08/2022 1038   CREATININE 0.84 10/01/2023 1136   CREATININE 0.93 06/15/2023 1122      Component Value Date/Time   CALCIUM 9.3 10/01/2023 1136   ALKPHOS 58 09/27/2023 0727   AST 23 09/27/2023 0727   AST 16 06/15/2023 1122   ALT 13 09/27/2023 0727   ALT 8 06/15/2023 1122   BILITOT 0.7 09/27/2023 0727   BILITOT 0.4 06/15/2023 1122       RADIOGRAPHIC STUDIES:  No results found.   ASSESSMENT/PLAN:  This very pleasant 78 year old Caucasian female with thrombocytosis and mild anemia.  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   Last set of labs also showed low ferritin for which ***  The patient had a repeat CBC today.  Her white blood cell count is ***.  Her hemoglobin is ***.  Her platelet count is ***  Her other lab studies are pending at this time.  Dr. Arbutus Ped recommends ***  Will see her back for labs and follow-up visit in 6 months.  The patient was advised to call immediately if she has any concerning symptoms in the interval. The patient voices understanding of current disease status and treatment options and is in agreement with the current care plan. All questions were answered. The patient knows to call the clinic with any problems, questions or concerns. We can certainly see the patient much sooner if necessary   No orders of the defined types were placed in this encounter.    I spent {CHL ONC TIME VISIT - NGEXB:[id number]} counseling the patient face to face. The total time spent in the appointment was {CHL ONC TIME VISIT -  UUVOZ:[id number]}.  Ozell Ferrera L Kyonna Frier, PA-C 02/14/24

## 2024-02-15 LAB — FERRITIN: Ferritin: 11 ng/mL (ref 11–307)

## 2024-02-23 ENCOUNTER — Telehealth: Payer: Self-pay | Admitting: Orthopedic Surgery

## 2024-02-23 NOTE — Telephone Encounter (Signed)
 Pt came in office to request her records be sent to [facility] [address], [place] [address]. Telephone [phone]  Fax [phone]

## 2024-02-23 NOTE — Telephone Encounter (Signed)
 Can you help with this please.

## 2024-02-27 ENCOUNTER — Other Ambulatory Visit (HOSPITAL_COMMUNITY): Payer: Self-pay

## 2024-02-27 MED ORDER — LORAZEPAM 1 MG PO TABS
0.5000 mg | ORAL_TABLET | ORAL | 0 refills | Status: DC | PRN
  Filled 2024-02-28: qty 30, 30d supply, fill #0

## 2024-02-28 ENCOUNTER — Other Ambulatory Visit (HOSPITAL_COMMUNITY): Payer: Self-pay

## 2024-02-29 DIAGNOSIS — M25571 Pain in right ankle and joints of right foot: Secondary | ICD-10-CM | POA: Diagnosis not present

## 2024-02-29 DIAGNOSIS — M65871 Other synovitis and tenosynovitis, right ankle and foot: Secondary | ICD-10-CM | POA: Diagnosis not present

## 2024-02-29 DIAGNOSIS — M19071 Primary osteoarthritis, right ankle and foot: Secondary | ICD-10-CM | POA: Diagnosis not present

## 2024-03-06 DIAGNOSIS — E7800 Pure hypercholesterolemia, unspecified: Secondary | ICD-10-CM | POA: Diagnosis not present

## 2024-03-12 ENCOUNTER — Ambulatory Visit: Admitting: Orthopedic Surgery

## 2024-03-12 ENCOUNTER — Encounter: Payer: Self-pay | Admitting: Orthopedic Surgery

## 2024-03-12 DIAGNOSIS — I872 Venous insufficiency (chronic) (peripheral): Secondary | ICD-10-CM

## 2024-03-12 NOTE — Progress Notes (Signed)
 Office Visit Note   Patient: Julie Mann           Date of Birth: 07-06-1946           MRN: [id number] Visit Date: 03/12/2024              Requested by: Benedetto Brady, MD [address] [place],  [place] [address] PCP: Benedetto Brady, MD  Chief Complaint  Patient presents with   Right Foot - Pain      HPI: Patient is a 78 year old woman who is seen in follow-up for both lower extremities.  She is status post right ankle arthroscopy in November of last year.  Patient stated that she still had pain with activities of daily living and wanted to consider a total ankle arthroplasty.  Recommended following up with Dr. Rosebud Confer for the procedure however patient states at this time she does not want to consider a total ankle replacement.  Patient states that recently she was with podiatry and was given an injection over the peroneal tendons.  She states she feels like the brawny venous stasis changes in the right foot have increased since her injection.  Assessment & Plan: Visit Diagnoses:  1. Venous stasis dermatitis of both lower extremities     Plan: Recommended knee-high compression stockings to help with the venous insufficiency.  Follow-Up Instructions: Return if symptoms worsen or fail to improve.   Ortho Exam  Patient is alert, oriented, no adenopathy, well-dressed, normal affect, normal respiratory effort. Examination patient has good pulses bilaterally she has brawny venous insufficiency worse in the forefoot than the ankle and worse on the right foot than the left.  With manipulation the Broady edema can be pushed out of the wound with the varicose veins.  Patient has no tenderness to palpation over the peroneal tendons or the posterior tibial tendon.  Imaging: No results found. No images are attached to the encounter.  Labs: Lab Results  Component Value Date   REPTSTATUS 02/10/2021 FINAL 02/05/2021   GRAMSTAIN  02/05/2021    MODERATE WBC PRESENT,BOTH PMN AND  MONONUCLEAR NO ORGANISMS SEEN    CULT  02/05/2021    RARE STAPHYLOCOCCUS CAPITIS RARE STAPHYLOCOCCUS EPIDERMIDIS NO ANAEROBES ISOLATED Performed at [facility], [address] [place], [place] [address]    [facility] STAPHYLOCOCCUS CAPITIS 02/05/2021   LABORGA STAPHYLOCOCCUS EPIDERMIDIS 02/05/2021     Lab Results  Component Value Date   ALBUMIN 4.5 02/14/2024   ALBUMIN 3.6 09/27/2023   ALBUMIN 3.9 06/15/2023    No results found for: "MG" No results found for: "VD25OH"  No results found for: "PREALBUMIN"    Latest Ref Rng & Units 02/14/2024    2:53 PM 10/01/2023   11:36 AM 09/27/2023    7:27 AM  CBC EXTENDED  WBC 4.0 - 10.5 K/uL 8.2  8.4  8.4   RBC 3.87 - 5.11 MIL/uL 4.97  4.80  4.49   Hemoglobin 12.0 - 15.0 g/dL 82.9  56.2  13.0   HCT 36.0 - 46.0 % 40.0  38.5  35.9   Platelets 150 - 400 K/uL 407  424  492   NEUT# 1.7 - 7.7 K/uL 6.4     Lymph# 0.7 - 4.0 K/uL 1.2        There is no height or weight on file to calculate BMI.  Orders:  No orders of the defined types were placed in this encounter.  No orders of the defined types were placed in this  encounter.    Procedures: No procedures performed  Clinical Data: No additional findings.  ROS:  All other systems negative, except as noted in the HPI. Review of Systems  Objective: Vital Signs: LMP 11/14/1992   Specialty Comments:  EXAM: MRI LUMBAR SPINE WITHOUT CONTRAST   TECHNIQUE: Multiplanar, multisequence MR imaging of the lumbar spine was performed. No intravenous contrast was administered.   COMPARISON:  Lumbar spine MRI 05/30/2012   FINDINGS: Segmentation:  Standard.   Alignment: Slight lower lumbar levoscoliosis. No significant listhesis.   Vertebrae: No fracture or suspicious marrow lesion. Mild degenerative endplate edema at U2-V2.   Conus medullaris and cauda equina: Conus extends to the T12 level and is suboptimally evaluated as it was not covered on axial images. Unremarkable  appearance of the cauda equina.   Paraspinal and other soft tissues: Small renal cysts.   Disc levels:   Disc desiccation throughout the lumbar spine with exception of L3-4. Progressive, severe disc space narrowing at L5-S1.   L1-2: Negative.   L2-3: At most minimal disc bulging without stenosis.   L3-4: Minimal leftward disc bulging without stenosis.   L4-5: Disc bulging and moderate facet and ligamentum flavum hypertrophy, mildly progressed but without associated stenosis. Small synovial cysts posterior to the facet joints bilaterally, not in a position to cause neural impingement.   L5-S1: Disc bulging, endplate spurring, disc space height loss, and mild facet hypertrophy without significant stenosis.   IMPRESSION: 1. Mild progression advanced disc degeneration at L5-S1 without stenosis. 2. Mildly progressive facet hypertrophy at L4-5 without stenosis.     Electronically Signed   By: Aundra Lee M.D.   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   Posterior vitreous detachment of both eyes 07/27/2021   Pseudophakia of right eye 07/27/2021   Pseudophakia of left eye 07/27/2021   Vitreomacular adhesion of right eye 07/27/2021   Macular pucker, right eye 07/27/2021   Hardware complicating wound infection (HCC)    Neck pain 10/05/2020   Nodule of upper lobe of right lung 08/17/2020   Biceps tendonosis of left shoulder 02/13/2020   Impingement syndrome of left shoulder 02/13/2020   Post-operative state 02/13/2020   Ankle dislocation, right, initial encounter 03/10/2019   Trimalleolar fracture of ankle, closed, right, initial encounter    Chronic left shoulder pain 02/19/2019   Anxiety  12/17/2016   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  stairs (If Iam tired)   Dysrhythmia    PAF   Fall from horse 01/2018   Fracture, ankle 02/2019   Right    High cholesterol    History of stomach ulcers ~ 1966   Osteopenia     Family History  Problem Relation Age  of Onset   Breast cancer Maternal Aunt    Breast cancer Maternal Grandmother    Heart disease Mother    Stroke Father    Rectal cancer Paternal Grandmother    Breast cancer Sister 62   Breast cancer Sister 2    Past Surgical History:  Procedure Laterality Date   ANKLE ARTHROSCOPY Right 09/27/2023   Procedure: RIGHT ANKLE ARTHROSCOPY AND DEBRIDEMENT;  Surgeon: Timothy Ford, MD;  Location: [facility] OR;  Service: Orthopedics;  Laterality: Right;   ANUS SURGERY  2005   "biopsy"   BREAST CYST EXCISION Right 1966   DILATION AND CURETTAGE OF UTERUS  1980's   S/P miscarriage   EXTERNAL FIXATION LEG Right 03/10/2019   Procedure: OPEN REDUCTION INTERNAL FIXATION RIGHT ANKLE;  Surgeon: Osa Blase, MD;  Location: [facility] OR;  Service: Orthopedics;  Laterality: Right;   HARDWARE REMOVAL Right 02/05/2021   Procedure: REMOVAL OF HARDWARE RIGHT ANKLE;  Surgeon: Timothy Ford, MD;  Location: [facility] OR;  Service: Orthopedics;  Laterality: Right;   VARICOSE VEIN SURGERY Left    left leg   VIDEO BRONCHOSCOPY WITH ENDOBRONCHIAL NAVIGATION Right 08/25/2020   Procedure: VIDEO BRONCHOSCOPY WITH ENDOBRONCHIAL NAVIGATION WITH FIDUCIAL PLACEMENT;  Surgeon: Prudy Brownie,  DO;  Location: [facility] OR;  Service: Pulmonary;  Laterality: Right;   Social History   Occupational History   Occupation: Violinist  Tobacco Use   Smoking status: Former    Current packs/day: 0.00    Average packs/day: 1 pack/day for 30.0 years (30.0 ttl pk-yrs)    Types: Cigarettes    Start date: 4    Quit date: 2005    Years since quitting: 20.3   Smokeless tobacco: Never  Vaping Use   Vaping status: Never Used  Substance and Sexual Activity   Alcohol use: No   Drug use: No   Sexual activity: Not Currently    Partners: Male    Birth control/protection: Post-menopausal

## 2024-03-20 ENCOUNTER — Other Ambulatory Visit (HOSPITAL_COMMUNITY): Payer: Self-pay

## 2024-03-20 DIAGNOSIS — M25571 Pain in right ankle and joints of right foot: Secondary | ICD-10-CM | POA: Diagnosis not present

## 2024-03-20 MED ORDER — MELOXICAM 15 MG PO TABS
15.0000 mg | ORAL_TABLET | Freq: Every day | ORAL | 0 refills | Status: DC
  Filled 2024-03-20: qty 30, 30d supply, fill #0

## 2024-03-21 ENCOUNTER — Other Ambulatory Visit (HOSPITAL_COMMUNITY): Payer: Self-pay

## 2024-03-21 MED ORDER — IBUPROFEN 600 MG PO TABS
600.0000 mg | ORAL_TABLET | ORAL | 1 refills | Status: DC | PRN
  Filled 2024-03-21: qty 30, 30d supply, fill #0
  Filled 2024-07-31: qty 30, 30d supply, fill #1

## 2024-03-27 ENCOUNTER — Other Ambulatory Visit (HOSPITAL_COMMUNITY): Payer: Self-pay

## 2024-03-27 ENCOUNTER — Other Ambulatory Visit: Payer: Self-pay

## 2024-03-27 MED ORDER — LORAZEPAM 1 MG PO TABS
1.0000 mg | ORAL_TABLET | Freq: Every day | ORAL | 0 refills | Status: DC | PRN
  Filled 2024-03-27: qty 30, 30d supply, fill #0

## 2024-03-29 ENCOUNTER — Encounter: Payer: Self-pay | Admitting: *Deleted

## 2024-04-01 DIAGNOSIS — E7800 Pure hypercholesterolemia, unspecified: Secondary | ICD-10-CM | POA: Diagnosis not present

## 2024-04-02 DIAGNOSIS — M79671 Pain in right foot: Secondary | ICD-10-CM | POA: Diagnosis not present

## 2024-04-02 LAB — HEPATIC FUNCTION PANEL
ALT: 12 IU/L (ref 0–32)
AST: 21 IU/L (ref 0–40)
Albumin: 4.3 g/dL (ref 3.8–4.8)
Alkaline Phosphatase: 75 IU/L (ref 44–121)
Bilirubin Total: 0.4 mg/dL (ref 0.0–1.2)
Bilirubin, Direct: 0.14 mg/dL (ref 0.00–0.40)
Total Protein: 6.7 g/dL (ref 6.0–8.5)

## 2024-04-02 LAB — LIPID PANEL
Chol/HDL Ratio: 2.9 ratio (ref 0.0–4.4)
Cholesterol, Total: 226 mg/dL — ABNORMAL HIGH (ref 100–199)
HDL: 78 mg/dL (ref >39)
LDL Chol Calc (NIH): 123 mg/dL — ABNORMAL HIGH (ref 0–99)
Triglycerides: 146 mg/dL (ref 0–149)
VLDL Cholesterol Cal: 25 mg/dL (ref 5–40)

## 2024-04-04 NOTE — Telephone Encounter (Signed)
 Called and spoke to pt; she states she is tolerating the Rosuvastatin  and has been taking it for five days now. She plans to continue taking this medication. She also shares that she is participating in a research study with Dr. Marguerita Shih (at [facility]); she had cancer about five years ago and is now seeing this MD related to this. She is planning to have multiple labs drawn on 08/22/2024. She would like a call back (preferably a call vs MyChart msg) regarding when she should have labs repeated, since she just re started the Rosuvastatin .

## 2024-04-05 ENCOUNTER — Ambulatory Visit: Payer: Self-pay | Admitting: Internal Medicine

## 2024-04-05 DIAGNOSIS — E7800 Pure hypercholesterolemia, unspecified: Secondary | ICD-10-CM

## 2024-04-05 DIAGNOSIS — Z79899 Other long term (current) drug therapy: Secondary | ICD-10-CM

## 2024-04-05 DIAGNOSIS — M19171 Post-traumatic osteoarthritis, right ankle and foot: Secondary | ICD-10-CM | POA: Diagnosis not present

## 2024-04-05 NOTE — Progress Notes (Signed)
 Patient Name: Julie Mann MR#: [id number] Author: [place] Miller Case, MD    Subjective:   CC: right ankle pain  HPI: Julie Mann is a 78 y.o. female presents today with chief complaint of right ankle pain.  The pain has been present for well over a year now.  She has a history of a trimalleolar ankle fracture that was fixed by Dr. Harden back in 2020.  She underwent a secondary surgery and cleanout and has had hardware removal.  Presenting now for a second opinion.  HPI     New Patient    Additional comments: Pt is here for right ankle pain that has been ongoing for over a year now [place].  She had a scope sx last year with Dr. Harden, and another significant surgery years ago.  She reports her foot being purple.      Last edited by Jon Avelina Fischer, ATC on 04/05/2024 11:28 AM.      --- Interval History   ---  Objective:   Physical Exam:  Examination of the right ankle reveals grossly neutral alignment.  There is mild swelling and some purplish discoloration more distally in the foot with some bruising.  There is no real major focal tenderness to palpation.  Range of motion of the ankle was very limited with dorsiflexion limited to about 5 degrees and plantarflexion limited to about 20 degrees. __ Diagnostic Studies/Labs:  Outside reports of x-rays and MRIs of the ankle were reviewed.  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  I would first recommend that she attempt to modify her shoewear, potentially with some rocker-bottom shoes that are little bit more forgiving for ankle motion prior to considering a surgical intervention which likely would need to be a ankle fusion.  Should the shoewear not work, we could get her into see either Dr.  Glendia in [place] or Dr. Duwaine in [facility] depending on which location is more convenient for her for further evaluation.  __ Plan of care:   Rocker-bottom shoe wear Consider referral to Dr. Duwaine or Dr. Glendia  Follow up as needed.  __ MDM:  Medical Decision Making Elements:  Number/Complexity of Problems: Chronic illness exacerbation Risk of Complications/morbidity: Low Amount/Complexity of Data: Reviewing multiple notes from outside providers including operative note, visit notes, and radiology reports

## 2024-04-11 ENCOUNTER — Ambulatory Visit: Admitting: Orthopedic Surgery

## 2024-04-11 NOTE — Telephone Encounter (Signed)
 Called and spoke to pt. She is tolerating the Statin and has been taking it daily. Plan is to recheck labs (Liver and Lipids) on 08/22/2024, as she has other labs that are due that day. Orders entered. No other concerns, except she has been having trouble with mobility and pain in her foot. Pt states she just started applying Voltaren Cream/Gel two days ago and is not sure if it is helping the foot pain.

## 2024-04-15 ENCOUNTER — Ambulatory Visit
Admission: RE | Admit: 2024-04-15 | Discharge: 2024-04-15 | Disposition: A | Discharge status: Home / Self Care | Payer: Medicare Other | Source: Ambulatory Visit | Attending: Family Medicine | Admitting: Family Medicine

## 2024-04-15 DIAGNOSIS — M8588 Other specified disorders of bone density and structure, other site: Secondary | ICD-10-CM | POA: Diagnosis not present

## 2024-04-15 DIAGNOSIS — M8589 Other specified disorders of bone density and structure, multiple sites: Secondary | ICD-10-CM

## 2024-04-24 ENCOUNTER — Encounter (HOSPITAL_COMMUNITY): Payer: Self-pay

## 2024-04-24 ENCOUNTER — Other Ambulatory Visit (HOSPITAL_COMMUNITY): Payer: Self-pay

## 2024-04-24 MED ORDER — LORAZEPAM 1 MG PO TABS
0.5000 mg | ORAL_TABLET | Freq: Every day | ORAL | 0 refills | Status: DC | PRN
  Filled 2024-04-24: qty 30, 30d supply, fill #0

## 2024-04-24 MED ORDER — IBUPROFEN 600 MG PO TABS
600.0000 mg | ORAL_TABLET | Freq: Every day | ORAL | 1 refills | Status: DC | PRN
  Filled 2024-04-24: qty 30, 30d supply, fill #0

## 2024-04-29 ENCOUNTER — Other Ambulatory Visit (HOSPITAL_COMMUNITY): Payer: Self-pay

## 2024-04-29 DIAGNOSIS — M19071 Primary osteoarthritis, right ankle and foot: Secondary | ICD-10-CM | POA: Diagnosis not present

## 2024-04-29 DIAGNOSIS — M79671 Pain in right foot: Secondary | ICD-10-CM | POA: Diagnosis not present

## 2024-04-29 NOTE — Progress Notes (Signed)
 04/29/2024 NAME: Julie Mann 78 y.o.   CHIEF COMPLAINT of  a 1 or more chronic problems with progression/exacerbation History of Present Illness Julie Mann is a 78 year old female who presents with persistent foot pain following surgery. She was referred by Dr. Harden for evaluation of persistent foot pain.  She experiences persistent foot pain following a surgical procedure in [place], which is more severe than before the surgery. The incision line from the surgery is no longer visible, but the pain has been ongoing since the procedure.  There is a discoloration in her foot that appears normal in the morning and becomes purple as the day progresses. This has been occurring for several months. She associates the onset of the discoloration with a steroid injection given by another orthopedic specialist, despite her known allergy to prednisone .  She has a history of a fall resulting in an ankle fracture that required surgical intervention with screws. She has no history of diabetes, heart disease, cancer, or stroke. She is otherwise healthy and does not smoke.    ICD-10-CM  1. Right foot pain  M79.671  2. Arthritis of ankle, right  M19.071    No results found for: HGBA1C BMI Readings from Last 1 Encounters:  No data found for BMI      Assessment & Plan Ankle arthritis Chronic ankle arthritis with increased pain post-surgery. Options include ankle fusion or replacement; further evaluation required. She prefers not to use a brace. - Order weight-bearing CT of right ankle. - Check vitamin D level.  Blood flow issue in foot Intermittent discoloration and hyperemia suggestive of blood flow issue, with venous congestion as a differential. Further vascular evaluation necessary. - Order ABIs to assess blood flow. - Refer to vascular specialist for further evaluation. Treatment risks were counseling regarding non-operative management and possibility of surgery if problem/symptoms do not  resolve  I had a face-to-face discussion with the patient, with more than 50% of the time devoted to counseling and coordination of care. The content includes: diagnostic features and differential diagnosis of symptoms; natural history of the presenting illness; therapeutic options (both conventional and complementary) including a discussion of risks and benefits, side effects, etc.; prognostic factors and expectations of care. Patient preferences for care were reviewed, and the patient reflected good understanding of the material covered, with no barriers to learning. The patient verbalized understanding of exam findings and treatment plan. We engaged in the shared decision-making process and treatment options were discussed at length with the patient.   XRAYS Next Visit : []  None  []   WB   []   NWB                                                   []   Foot []   Ankle      ____________________________________________________   DATA 04/29/2024 5 weight bearing views of the Right foot and ankle: Imaging shows 2 screw fixation in the distal tibia as well as 2 screw fixation the medial malleolus there is significant joint space narrowing of the ankle.  There is no acute fracture seen in the ankle or foot.  There is mild joint space narrowing throughout the midfoot     ___________________________________________________   CHIEF COMPLAINT: No chief complaint on file.  PATIENT REPORTED HPI   Social History   Socioeconomic History  . Marital  status: Unknown   Tobacco Use: Unknown (04/29/2024)   Patient History   . Smoking Tobacco Use: Never   . Smokeless Tobacco Use: Unknown   . Passive Exposure: Not on file  Recent Concern: Tobacco Use - Medium Risk (03/12/2024)   Received from [facility]   Patient History   . Smoking Tobacco Use: Former   . Smokeless Tobacco Use: Never   . Passive Exposure: Not on file    PHYSICAL EXAM:     Ht Readings from Last 1 Encounters:  No data found for Ht       Physical Exam MUSCULOSKELETAL: I please see picture uploaded in epic to note the hyperemia and slow capillary refill in the forefoot.  She has healed portals from an ankle arthroscopy she has pain to range of motion of the ankle she has neutral hindfoot alignment          ___________________________________________________   ______________________________________________________________________ Patient Education:  Diagnosis and treatment plans discussed with patient and family, both operative and nonoperative and they appear to understand.   The patient understands exam findings and treatment plan. All questions were answered.  The patient was reminded that they should feel free to contact the office with any questions or concerns by phone or [facility] Chart messaging.  They can follow up with us  as mentioned above but know that if there is anything concerning develops they are welcome to see us  sooner.  For urgent matters if we are unable to be reached they should proceed to [facility] ER or the facility closest to them.   Our diabetic patients are reminded that ANY concern they have or ANY issue with their feet they should see us  immediately  This chart note was completed in total using Dragon Speech voice recognition software, Grammatical errors, random word insertions, pronoun errors and incomplete sentences are an occasional consequence of this system. Any formal questions of concerns about the content or information contained with in the body of this dictation should be directed to the provider for clarification. This note has been created using automated tools and reviewed for accuracy by KATHRYN MARIE O'CONNOR.

## 2024-05-06 DIAGNOSIS — M79671 Pain in right foot: Secondary | ICD-10-CM | POA: Diagnosis not present

## 2024-05-06 DIAGNOSIS — L819 Disorder of pigmentation, unspecified: Secondary | ICD-10-CM | POA: Diagnosis not present

## 2024-05-07 DIAGNOSIS — M19171 Post-traumatic osteoarthritis, right ankle and foot: Secondary | ICD-10-CM | POA: Diagnosis not present

## 2024-05-08 DIAGNOSIS — E7800 Pure hypercholesterolemia, unspecified: Secondary | ICD-10-CM | POA: Diagnosis not present

## 2024-05-13 DIAGNOSIS — M25571 Pain in right ankle and joints of right foot: Secondary | ICD-10-CM | POA: Diagnosis not present

## 2024-05-13 DIAGNOSIS — L819 Disorder of pigmentation, unspecified: Secondary | ICD-10-CM | POA: Diagnosis not present

## 2024-05-13 DIAGNOSIS — R413 Other amnesia: Secondary | ICD-10-CM | POA: Diagnosis not present

## 2024-05-14 NOTE — Progress Notes (Signed)
 04/29/2024 NAME: Julie Mann 78 y.o.   CHIEF COMPLAINT of  a 1 or more chronic problems with progression/exacerbation History of Present Illness Julie Mann is a 78 year old female who presents with persistent foot pain following surgery. She was referred by Dr. Harden for evaluation of persistent foot pain.  She experiences persistent foot pain following a surgical procedure in [place], which is more severe than before the surgery. The incision line from the surgery is no longer visible, but the pain has been ongoing since the procedure.  There is a discoloration in her foot that appears normal in the morning and becomes purple as the day progresses. This has been occurring for several months. She associates the onset of the discoloration with a steroid injection given by another orthopedic specialist, despite her known allergy to prednisone .  She has a history of a fall resulting in an ankle fracture that required surgical intervention with screws. She has no history of diabetes, heart disease, cancer, or stroke. She is otherwise healthy and does not smoke.  05/16/2024 The patient was last seen 04/29/2024 Today the patient prepatient sents with a 1 or more chronic problems with progression/exacerbation }.  Current issues are: Patient is here to review CT scan and ABIs and go over options for management of her arthritis.    ICD-10-CM  1. Right foot pain  M79.671  2. Arthritis of ankle, right  M19.071    No results found for: HGBA1C BMI Readings from Last 1 Encounters:  No data found for BMI      Assessment & Plan I had extensive discussion today with the patient at her options.  I personally think I would tend towards arthroscopic ankle fusion's because I have a lot of concerns about her skin even though her ABIs are normal.  We discussed ankle replacement need for potential reoperation we failure rates.  We discussed ankle fusion nonunion rates changes and biomechanics of the foot.   At this time she wants time to think about I given her some information on places to look at it.  She will let us  know how she think she wants to proceed.  I did discuss with her that I do not think removal of hardware will improve her pain I think that the pain is clearly from the arthritis and the hardware is not contributing  I had a face-to-face discussion with the patient, with more than 50% of the time devoted to counseling and coordination of care. The content includes: diagnostic features and differential diagnosis of symptoms; natural history of the presenting illness; therapeutic options (both conventional and complementary) including a discussion of risks and benefits, side effects, etc.; prognostic factors and expectations of care. Patient preferences for care were reviewed, and the patient reflected good understanding of the material covered, with no barriers to learning. The patient verbalized understanding of exam findings and treatment plan. We engaged in the shared decision-making process and treatment options were discussed at length with the patient.   XRAYS Next Visit : []  None  []   WB   []   NWB                                                   []   Foot []   Ankle      ____________________________________________________   DATA 04/29/2024 5 weight bearing views of  the Right foot and ankle: Imaging shows 2 screw fixation in the distal tibia as well as 2 screw fixation the medial malleolus there is significant joint space narrowing of the ankle.  There is no acute fracture seen in the ankle or foot.  There is mild joint space narrowing throughout the midfoot  Independent rotation of right ankle CT scan June 25 show significant arthritis of the ankle intact hardware posterior to anterior tibial screws and medial mall screws   Impression: Posttraumatic tibiotalar arthrosis.  ABI June 2025 Doppler Signals:   Right Left  Posterior Tibial biphasic biphasic  Dorsalis Pedis triphasic  triphasic    Segmental Limb Pressures:   Right Left  Brachial 183 177  Ankle (PT) 155 (0.85) 162 (0.89)  Ankle (DP) 182 (0.99) 175 (0.96)  Digit 142 (0.78) 121 (0.66)     ___________________________________________________   CHIEF COMPLAINT: No chief complaint on file.  PATIENT REPORTED HPI   Social History   Socioeconomic History  . Marital status: Unknown   Tobacco Use: Unknown (04/29/2024)   Patient History   . Smoking Tobacco Use: Never   . Smokeless Tobacco Use: Unknown   . Passive Exposure: Not on file  Recent Concern: Tobacco Use - Medium Risk (03/12/2024)   Received from [facility]   Patient History   . Smoking Tobacco Use: Former   . Smokeless Tobacco Use: Never   . Passive Exposure: Not on file    PHYSICAL EXAM:     Ht Readings from Last 1 Encounters:  No data found for Ht      Physical Exam MUSCULOSKELETAL: I please see picture uploaded in epic to note the hyperemia and slow capillary refill in the forefoot.  She has healed portals from an ankle arthroscopy she has pain to range of motion of the ankle she has neutral hindfoot alignment          ___________________________________________________   ______________________________________________________________________ Patient Education:  Diagnosis and treatment plans discussed with patient and family, both operative and nonoperative and they appear to understand.   The patient understands exam findings and treatment plan. All questions were answered.  The patient was reminded that they should feel free to contact the office with any questions or concerns by phone or [facility] messaging.  They can follow up with us  as mentioned above but know that if there is anything concerning develops they are welcome to see us  sooner.  For urgent matters if we are unable to be reached they should proceed to [facility] ER or the facility closest to them.   Our diabetic patients are reminded that ANY  concern they have or ANY issue with their feet they should see us  immediately  This chart note was completed in total using Dragon Speech voice recognition software, Grammatical errors, random word insertions, pronoun errors and incomplete sentences are an occasional consequence of this system. Any formal questions of concerns about the content or information contained with in the body of this dictation should be directed to the provider for clarification. This note has been created using automated tools and reviewed for accuracy by KATHRYN MARIE O'CONNOR.

## 2024-05-15 ENCOUNTER — Other Ambulatory Visit (HOSPITAL_COMMUNITY): Payer: Self-pay

## 2024-05-15 MED ORDER — IBUPROFEN 600 MG PO TABS
600.0000 mg | ORAL_TABLET | ORAL | 1 refills | Status: DC | PRN
  Filled 2024-05-15 (×2): qty 30, 30d supply, fill #0
  Filled 2024-06-14: qty 30, 30d supply, fill #1
  Filled ????-??-??: fill #0

## 2024-05-16 DIAGNOSIS — M19071 Primary osteoarthritis, right ankle and foot: Secondary | ICD-10-CM | POA: Diagnosis not present

## 2024-05-23 ENCOUNTER — Other Ambulatory Visit (HOSPITAL_COMMUNITY): Payer: Self-pay

## 2024-05-24 ENCOUNTER — Encounter (HOSPITAL_COMMUNITY): Payer: Self-pay

## 2024-05-24 ENCOUNTER — Other Ambulatory Visit (HOSPITAL_COMMUNITY): Payer: Self-pay

## 2024-05-24 MED ORDER — LORAZEPAM 1 MG PO TABS
0.5000 mg | ORAL_TABLET | Freq: Every day | ORAL | 0 refills | Status: DC | PRN
  Filled 2024-05-24: qty 30, 30d supply, fill #0

## 2024-06-03 ENCOUNTER — Ambulatory Visit: Admitting: Orthopedic Surgery

## 2024-06-03 DIAGNOSIS — M12571 Traumatic arthropathy, right ankle and foot: Secondary | ICD-10-CM

## 2024-06-03 DIAGNOSIS — I872 Venous insufficiency (chronic) (peripheral): Secondary | ICD-10-CM

## 2024-06-04 ENCOUNTER — Encounter: Payer: Self-pay | Admitting: Orthopedic Surgery

## 2024-06-04 DIAGNOSIS — M12571 Traumatic arthropathy, right ankle and foot: Secondary | ICD-10-CM | POA: Diagnosis not present

## 2024-06-04 MED ORDER — LIDOCAINE HCL 1 % IJ SOLN
2.0000 mL | INTRAMUSCULAR | Status: AC | PRN
  Administered 2024-06-04: 2 mL

## 2024-06-04 MED ORDER — METHYLPREDNISOLONE ACETATE 40 MG/ML IJ SUSP
40.0000 mg | INTRAMUSCULAR | Status: AC | PRN
  Administered 2024-06-04: 40 mg via INTRA_ARTICULAR

## 2024-06-04 NOTE — Progress Notes (Signed)
 Office Visit Note   Patient: Julie Mann           Date of Birth: [birth year]           MRN: [id number] Visit Date: 06/03/2024              Requested by: Leonel Cole, MD [address] [place],  [place] [address] PCP: Leonel Cole, MD  Chief Complaint  Patient presents with   Right Foot - Follow-up      HPI: Patient is a 78 year old woman presents in follow-up for traumatic arthritis right ankle.  Patient has obtained several consults including both at [facility] and [facility] with recommendations spanning from fusion to total ankle arthroplasty.  Also have discussed using a ankle stabilizing brace and she is not interested in this either.  Patient still has pain of the anterior aspect of the right ankle she is status post arthroscopic debridement.  She is wearing compression stockings.  Assessment & Plan: Visit Diagnoses:  1. Venous stasis dermatitis of both lower extremities   2. Traumatic arthritis of right ankle     Plan: Right ankle was injected from the anterior medial portal.  Discussed that we could proceed with additional injections every 3 to 4 months.  Patient will follow-up as needed.  Follow-Up Instructions: No follow-ups on file.   Ortho Exam  Patient is alert, oriented, no adenopathy, well-dressed, normal affect, normal respiratory effort. Examination patient has decreased range of motion of her ankle she is primarily tender to palpation over the anterior joint line.  The peroneal posterior tibial tendon is not tender to palpation.  There is no redness no cellulitis.  Patient does have venous insufficiency without ulceration.  Patient has a palpable dorsalis pedis pulse by report she has normal ABIs which is consistent with her physical exam.  Patient does have brawny edema through the foot consistent with venous insufficiency.    Imaging: No results found. No images are attached to the encounter.  Labs: Lab Results  Component Value Date    REPTSTATUS 02/10/2021 FINAL 02/05/2021   GRAMSTAIN  02/05/2021    MODERATE WBC PRESENT,BOTH PMN AND MONONUCLEAR NO ORGANISMS SEEN    CULT  02/05/2021    RARE STAPHYLOCOCCUS CAPITIS RARE STAPHYLOCOCCUS EPIDERMIDIS NO ANAEROBES ISOLATED Performed at [facility], [address] [place], [place] [address]    [facility] STAPHYLOCOCCUS CAPITIS 02/05/2021   [facility]ORGA STAPHYLOCOCCUS EPIDERMIDIS 02/05/2021     Lab Results  Component Value Date   ALBUMIN 4.3 04/01/2024   ALBUMIN 4.5 02/14/2024   ALBUMIN 3.6 09/27/2023    No results found for: MG No results found for: VD25OH  No results found for: PREALBUMIN    Latest Ref Rng & Units 02/14/2024    2:53 PM 10/01/2023   11:36 AM 09/27/2023    7:27 AM  CBC EXTENDED  WBC 4.0 - 10.5 K/uL 8.2  8.4  8.4   RBC 3.87 - 5.11 MIL/uL 4.97  4.80  4.49   Hemoglobin 12.0 - 15.0 g/dL 86.9  88.0  88.5   HCT 36.0 - 46.0 % 40.0  38.5  35.9   Platelets 150 - 400 K/uL 407  424  492   NEUT# 1.7 - 7.7 K/uL 6.4     Lymph# 0.7 - 4.0 K/uL 1.2        There is no height or weight on file to calculate BMI.  Orders:  No orders of the defined types were placed in this encounter.  No orders of the defined types were placed in this encounter.    Procedures: Medium Joint Inj: R ankle on 06/04/2024 11:24 AM Indications: pain and diagnostic evaluation Details: 22 G 1.5 in needle, anteromedial approach Medications: 2 mL lidocaine  1 %; 40 mg methylPREDNISolone  acetate 40 MG/ML Outcome: tolerated well, no immediate complications Procedure, treatment alternatives, risks and benefits explained, specific risks discussed. Consent was given by the patient. Immediately prior to procedure a time out was called to verify the correct patient, procedure, equipment, support staff and site/side marked as required. Patient was prepped and draped in the usual sterile fashion.      Clinical Data: No additional findings.  ROS:  All other systems negative,  except as noted in the HPI. Review of Systems  Objective: Vital Signs: LMP 11/14/1992   Specialty Comments:  EXAM: MRI LUMBAR SPINE WITHOUT CONTRAST   TECHNIQUE: Multiplanar, multisequence MR imaging of the lumbar spine was performed. No intravenous contrast was administered.   COMPARISON:  Lumbar spine MRI 05/30/2012   FINDINGS: Segmentation:  Standard.   Alignment: Slight lower lumbar levoscoliosis. No significant listhesis.   Vertebrae: No fracture or suspicious marrow lesion. Mild degenerative endplate edema at O4-D8.   Conus medullaris and cauda equina: Conus extends to the T12 level and is suboptimally evaluated as it was not covered on axial images. Unremarkable appearance of the cauda equina.   Paraspinal and other soft tissues: Small renal cysts.   Disc levels:   Disc desiccation throughout the lumbar spine with exception of L3-4. Progressive, severe disc space narrowing at L5-S1.   L1-2: Negative.   L2-3: At most minimal disc bulging without stenosis.   L3-4: Minimal leftward disc bulging without stenosis.   L4-5: Disc bulging and moderate facet and ligamentum flavum hypertrophy, mildly progressed but without associated stenosis. Small synovial cysts posterior to the facet joints bilaterally, not in a position to cause neural impingement.   L5-S1: Disc bulging, endplate spurring, disc space height loss, and mild facet hypertrophy without significant stenosis.   IMPRESSION: 1. Mild progression advanced disc degeneration at L5-S1 without stenosis. 2. Mildly progressive facet hypertrophy at L4-5 without stenosis.     Electronically Signed   By: Dasie Hamburg M.D.   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   Posterior vitreous detachment of both eyes 07/27/2021   Pseudophakia of right eye 07/27/2021   Pseudophakia of left eye 07/27/2021   Vitreomacular adhesion of right eye 07/27/2021   Macular pucker, right eye 07/27/2021   Hardware complicating wound infection (HCC)    Neck pain 10/05/2020   Nodule of upper lobe of right lung 08/17/2020   Biceps tendonosis of left shoulder 02/13/2020   Impingement syndrome of left shoulder 02/13/2020   Post-operative state 02/13/2020   Ankle dislocation, right, initial encounter 03/10/2019   Trimalleolar fracture of ankle, closed, right, initial encounter    Chronic left shoulder pain 02/19/2019   Anxiety 12/17/2016   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HCC) 12/17/2016   Osteopenia 12/17/2016   Paroxysmal atrial fibrillation (HCC) 12/17/2016   Pure hypercholesterolemia 12/17/2016   Rectal cancer (HCC) 07/30/2015   Past Medical History:  Diagnosis Date   Anal cancer (HCC) 2005   SCCa anus-stage II chemo,  radiation   Anxiety    Arthritis    hands, fingers, back (02/27/2014)   Atrial fibrillation (HCC)    Cat scratch of right hand 02/24/2014   COPD (chronic obstructive pulmonary disease) (HCC)    Dyspnea    walking up hill;.  stairs (If Iam tired)   Dysrhythmia    PAF   Fall from horse 01/2018   Fracture, ankle 02/2019   Right    High cholesterol    History of stomach ulcers ~ 1966   Osteopenia     Family History  Problem Relation Age of Onset   Breast cancer Maternal Aunt    Breast cancer Maternal Grandmother    Heart disease Mother    Stroke Father    Rectal cancer Paternal Grandmother    Breast cancer Sister 4   Breast cancer Sister 58    Past Surgical History:  Procedure Laterality Date   ANKLE ARTHROSCOPY Right 09/27/2023   Procedure: RIGHT ANKLE ARTHROSCOPY AND DEBRIDEMENT;  Surgeon: Harden Jerona GAILS, MD;  Location[facility];  Service: Orthopedics;  Laterality: Right;   ANUS SURGERY  2005    biopsy   BREAST CYST EXCISION Right 1966   DILATION AND CURETTAGE OF UTERUS  1980's   S/P miscarriage   EXTERNAL FIXATION LEG Right 03/10/2019   Procedure: OPEN REDUCTION INTERNAL FIXATION RIGHT ANKLE;  Surgeon: Josefina Chew, MD;  Location: [facility] OR;  Service: Orthopedics;  Laterality: Right;   HARDWARE REMOVAL Right 02/05/2021   Procedure: REMOVAL OF HARDWARE RIGHT ANKLE;  Surgeon: Harden Jerona GAILS, MD;  Location: [facility] OR;  Service: Orthopedics;  Laterality: Right;   VARICOSE VEIN SURGERY Left    left leg   VIDEO BRONCHOSCOPY WITH ENDOBRONCHIAL NAVIGATION Right 08/25/2020   Procedure: VIDEO BRONCHOSCOPY WITH ENDOBRONCHIAL NAVIGATION WITH FIDUCIAL PLACEMENT;  Surgeon: Brenna Adine CROME, DO;  Location: [facility] OR;  Service: Pulmonary;  Laterality: Right;   Social History   Occupational History   Occupation: Violinist  Tobacco Use   Smoking status: Former    Current packs/day: 0.00    Average packs/day: 1 pack/day for 30.0 years (30.0 ttl pk-yrs)    Types: Cigarettes    Start date: 50    Quit date: 2005    Years since quitting: 20.5   Smokeless tobacco: Never  Vaping Use   Vaping status: Never Used  Substance and Sexual Activity   Alcohol use: No   Drug use: No   Sexual activity: Not Currently    Partners: Male    Birth control/protection: Post-menopausal

## 2024-06-05 ENCOUNTER — Other Ambulatory Visit (HOSPITAL_COMMUNITY): Payer: Self-pay

## 2024-06-05 ENCOUNTER — Other Ambulatory Visit: Payer: Self-pay

## 2024-06-05 MED ORDER — METOPROLOL TARTRATE 25 MG PO TABS
25.0000 mg | ORAL_TABLET | Freq: Two times a day (BID) | ORAL | 1 refills | Status: DC
  Filled 2024-06-05: qty 180, 90d supply, fill #0

## 2024-06-14 ENCOUNTER — Other Ambulatory Visit (HOSPITAL_COMMUNITY): Payer: Self-pay

## 2024-06-21 ENCOUNTER — Other Ambulatory Visit (HOSPITAL_COMMUNITY): Payer: Self-pay

## 2024-06-21 MED ORDER — LORAZEPAM 1 MG PO TABS
0.5000 mg | ORAL_TABLET | Freq: Every day | ORAL | 0 refills | Status: DC | PRN
  Filled 2024-06-21: qty 30, 30d supply, fill #0

## 2024-06-27 ENCOUNTER — Other Ambulatory Visit (HOSPITAL_COMMUNITY): Payer: Self-pay

## 2024-06-27 ENCOUNTER — Other Ambulatory Visit: Payer: Self-pay

## 2024-06-27 MED ORDER — ROSUVASTATIN CALCIUM 5 MG PO TABS
5.0000 mg | ORAL_TABLET | Freq: Every day | ORAL | 2 refills | Status: DC
  Filled 2024-06-27 – 2024-06-28 (×2): qty 90, 90d supply, fill #0
  Filled 2024-08-06: qty 90, 90d supply, fill #1

## 2024-06-28 ENCOUNTER — Other Ambulatory Visit: Payer: Self-pay

## 2024-06-28 ENCOUNTER — Other Ambulatory Visit (HOSPITAL_COMMUNITY): Payer: Self-pay

## 2024-07-03 DIAGNOSIS — I350 Nonrheumatic aortic (valve) stenosis: Secondary | ICD-10-CM | POA: Diagnosis not present

## 2024-07-03 DIAGNOSIS — F5101 Primary insomnia: Secondary | ICD-10-CM | POA: Diagnosis not present

## 2024-07-03 DIAGNOSIS — G3184 Mild cognitive impairment, so stated: Secondary | ICD-10-CM | POA: Diagnosis not present

## 2024-07-03 DIAGNOSIS — I480 Paroxysmal atrial fibrillation: Secondary | ICD-10-CM | POA: Diagnosis not present

## 2024-07-03 DIAGNOSIS — E7800 Pure hypercholesterolemia, unspecified: Secondary | ICD-10-CM | POA: Diagnosis not present

## 2024-07-03 DIAGNOSIS — I10 Essential (primary) hypertension: Secondary | ICD-10-CM | POA: Diagnosis not present

## 2024-07-03 DIAGNOSIS — R413 Other amnesia: Secondary | ICD-10-CM | POA: Diagnosis not present

## 2024-07-03 DIAGNOSIS — M8589 Other specified disorders of bone density and structure, multiple sites: Secondary | ICD-10-CM | POA: Diagnosis not present

## 2024-07-03 DIAGNOSIS — L603 Nail dystrophy: Secondary | ICD-10-CM | POA: Diagnosis not present

## 2024-07-03 DIAGNOSIS — R7303 Prediabetes: Secondary | ICD-10-CM | POA: Diagnosis not present

## 2024-07-04 ENCOUNTER — Encounter: Payer: Self-pay | Admitting: Orthopedic Surgery

## 2024-07-04 ENCOUNTER — Ambulatory Visit: Admitting: Orthopedic Surgery

## 2024-07-04 DIAGNOSIS — M12571 Traumatic arthropathy, right ankle and foot: Secondary | ICD-10-CM

## 2024-07-04 DIAGNOSIS — I872 Venous insufficiency (chronic) (peripheral): Secondary | ICD-10-CM

## 2024-07-04 NOTE — Progress Notes (Signed)
 Office Visit Note   Patient: Julie Mann           Date of Birth: November 23, 1945           MRN: [id number] Visit Date: 07/04/2024              Requested by: Leonel Cole, MD [address] [place],  [place] [address] PCP: Leonel Cole, MD  Chief Complaint  Patient presents with   Right Foot - Follow-up      HPI: Patient is a 78 year old woman who presents in follow-up for traumatic arthritis and venous insufficiency right foot and ankle.  Patient is status post a steroid injection right ankle 4 weeks ago.  She states that this did help a little.  Assessment & Plan: Visit Diagnoses:  1. Venous stasis dermatitis of both lower extremities   2. Traumatic arthritis of right ankle     Plan: Follow-up in 3 months for evaluation for repeat injection.  She will use the Advil  cream she just purchased.  Follow-Up Instructions: Return in about 3 months (around 10/04/2024).   Ortho Exam  Patient is alert, oriented, no adenopathy, well-dressed, normal affect, normal respiratory effort. Examination patient has brawny edema of the right foot with varicose veins medially.  She has good range of motion of her ankle she has a good dorsalis pedis pulse.  There is swelling in the forefoot with venous brawny skin color changes.  She is wearing a thin pair of compression socks.    Imaging: No results found. No images are attached to the encounter.  Labs: Lab Results  Component Value Date   REPTSTATUS 02/10/2021 FINAL 02/05/2021   GRAMSTAIN  02/05/2021    MODERATE WBC PRESENT,BOTH PMN AND MONONUCLEAR NO ORGANISMS SEEN    CULT  02/05/2021    RARE STAPHYLOCOCCUS CAPITIS RARE STAPHYLOCOCCUS EPIDERMIDIS NO ANAEROBES ISOLATED Performed at [facility], [address], [place], [place] [address]    [facility] STAPHYLOCOCCUS CAPITIS 02/05/2021   LABORGA STAPHYLOCOCCUS EPIDERMIDIS 02/05/2021     Lab Results  Component Value Date   ALBUMIN 4.3 04/01/2024   ALBUMIN 4.5  02/14/2024   ALBUMIN 3.6 09/27/2023    No results found for: MG No results found for: VD25OH  No results found for: PREALBUMIN    Latest Ref Rng & Units 02/14/2024    2:53 PM 10/01/2023   11:36 AM 09/27/2023    7:27 AM  CBC EXTENDED  WBC 4.0 - 10.5 K/uL 8.2  8.4  8.4   RBC 3.87 - 5.11 MIL/uL 4.97  4.80  4.49   Hemoglobin 12.0 - 15.0 g/dL 86.9  88.0  88.5   HCT 36.0 - 46.0 % 40.0  38.5  35.9   Platelets 150 - 400 K/uL 407  424  492   NEUT# 1.7 - 7.7 K/uL 6.4     Lymph# 0.7 - 4.0 K/uL 1.2        There is no height or weight on file to calculate BMI.  Orders:  No orders of the defined types were placed in this encounter.  No orders of the defined types were placed in this encounter.    Procedures: No procedures performed  Clinical Data: No additional findings.  ROS:  All other systems negative, except as noted in the HPI. Review of Systems  Objective: Vital Signs: LMP 11/14/1992   Specialty Comments:  EXAM: MRI LUMBAR SPINE WITHOUT CONTRAST   TECHNIQUE: Multiplanar, multisequence MR imaging of the lumbar spine was performed.  No intravenous contrast was administered.   COMPARISON:  Lumbar spine MRI 05/30/2012   FINDINGS: Segmentation:  Standard.   Alignment: Slight lower lumbar levoscoliosis. No significant listhesis.   Vertebrae: No fracture or suspicious marrow lesion. Mild degenerative endplate edema at O4-D8.   Conus medullaris and cauda equina: Conus extends to the T12 level and is suboptimally evaluated as it was not covered on axial images. Unremarkable appearance of the cauda equina.   Paraspinal and other soft tissues: Small renal cysts.   Disc levels:   Disc desiccation throughout the lumbar spine with exception of L3-4. Progressive, severe disc space narrowing at L5-S1.   L1-2: Negative.   L2-3: At most minimal disc bulging without stenosis.   L3-4: Minimal leftward disc bulging without stenosis.   L4-5: Disc bulging and  moderate facet and ligamentum flavum hypertrophy, mildly progressed but without associated stenosis. Small synovial cysts posterior to the facet joints bilaterally, not in a position to cause neural impingement.   L5-S1: Disc bulging, endplate spurring, disc space height loss, and mild facet hypertrophy without significant stenosis.   IMPRESSION: 1. Mild progression advanced disc degeneration at L5-S1 without stenosis. 2. Mildly progressive facet hypertrophy at L4-5 without stenosis.     Electronically Signed   By: Dasie Hamburg M.D.   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   Posterior vitreous detachment of both eyes 07/27/2021   Pseudophakia of right eye 07/27/2021   Pseudophakia of left eye 07/27/2021   Vitreomacular adhesion of right eye 07/27/2021   Macular pucker, right eye 07/27/2021   Hardware complicating wound infection (HCC)    Neck pain 10/05/2020   Nodule of upper lobe of right lung 08/17/2020   Biceps tendonosis of left shoulder 02/13/2020   Impingement syndrome of left shoulder 02/13/2020   Post-operative state 02/13/2020   Ankle dislocation, right, initial encounter 03/10/2019   Trimalleolar fracture of ankle, closed, right, initial encounter    Chronic left shoulder pain 02/19/2019   Anxiety 12/17/2016   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HCC) 12/17/2016   Osteopenia 12/17/2016   Paroxysmal atrial fibrillation (HCC) 12/17/2016   Pure hypercholesterolemia 12/17/2016   Rectal cancer (HCC) 07/30/2015   Past Medical History:  Diagnosis Date   Anal cancer (HCC) 2005   SCCa anus-stage II chemo, radiation   Anxiety    Arthritis    hands, fingers, back (02/27/2014)   Atrial  fibrillation (HCC)    Cat scratch of right hand 02/24/2014   COPD (chronic obstructive pulmonary disease) (HCC)    Dyspnea    walking up hill;.  stairs (If Iam tired)   Dysrhythmia    PAF   Fall from horse 01/2018   Fracture, ankle 02/2019   Right    High cholesterol    History of stomach ulcers ~ 1966   Osteopenia     Family History  Problem Relation Age of Onset   Breast cancer Maternal Aunt    Breast cancer Maternal Grandmother    Heart disease Mother    Stroke Father    Rectal cancer Paternal Grandmother    Breast cancer Sister 39   Breast cancer Sister 10    Past Surgical History:  Procedure Laterality Date   ANKLE ARTHROSCOPY  Right 09/27/2023   Procedure: RIGHT ANKLE ARTHROSCOPY AND DEBRIDEMENT;  Surgeon: Harden Jerona GAILS, MD;  Location: [facility] OR;  Service: Orthopedics;  Laterality: Right;   ANUS SURGERY  2005   biopsy   BREAST CYST EXCISION Right 1966   DILATION AND CURETTAGE OF UTERUS  1980's   S/P miscarriage   EXTERNAL FIXATION LEG Right 03/10/2019   Procedure: OPEN REDUCTION INTERNAL FIXATION RIGHT ANKLE;  Surgeon: Josefina Chew, MD;  Location: [facility] OR;  Service: Orthopedics;  Laterality: Right;   HARDWARE REMOVAL Right 02/05/2021   Procedure: REMOVAL OF HARDWARE RIGHT ANKLE;  Surgeon: Harden Jerona GAILS, MD;  Location: [facility] OR;  Service: Orthopedics;  Laterality: Right;   VARICOSE VEIN SURGERY Left    left leg   VIDEO BRONCHOSCOPY WITH ENDOBRONCHIAL NAVIGATION Right 08/25/2020   Procedure: VIDEO BRONCHOSCOPY WITH ENDOBRONCHIAL NAVIGATION WITH FIDUCIAL PLACEMENT;  Surgeon: Brenna Adine CROME, DO;  Location: [facility] OR;  Service: Pulmonary;  Laterality: Right;   Social History   Occupational History   Occupation: Violinist  Tobacco Use   Smoking status: Former    Current packs/day: 0.00    Average packs/day: 1 pack/day for 30.0 years (30.0 ttl pk-yrs)    Types: Cigarettes    Start date: 15    Quit date: 2005    Years since quitting: 20.6   Smokeless tobacco: Never   Vaping Use   Vaping status: Never Used  Substance and Sexual Activity   Alcohol use: No   Drug use: No   Sexual activity: Not Currently    Partners: Male    Birth control/protection: Post-menopausal

## 2024-07-17 ENCOUNTER — Other Ambulatory Visit: Payer: Self-pay | Admitting: *Deleted

## 2024-07-17 DIAGNOSIS — E7800 Pure hypercholesterolemia, unspecified: Secondary | ICD-10-CM

## 2024-07-17 DIAGNOSIS — Z79899 Other long term (current) drug therapy: Secondary | ICD-10-CM

## 2024-07-17 DIAGNOSIS — L609 Nail disorder, unspecified: Secondary | ICD-10-CM | POA: Diagnosis not present

## 2024-07-19 ENCOUNTER — Other Ambulatory Visit (HOSPITAL_COMMUNITY): Payer: Self-pay

## 2024-07-19 MED ORDER — LORAZEPAM 1 MG PO TABS
0.5000 mg | ORAL_TABLET | Freq: Every day | ORAL | 0 refills | Status: DC | PRN
  Filled 2024-07-19: qty 30, 30d supply, fill #0

## 2024-07-25 DIAGNOSIS — E7800 Pure hypercholesterolemia, unspecified: Secondary | ICD-10-CM | POA: Diagnosis not present

## 2024-07-26 LAB — HEPATIC FUNCTION PANEL
ALT: 10 IU/L (ref 0–32)
AST: 19 IU/L (ref 0–40)
Albumin: 4.4 g/dL (ref 3.8–4.8)
Alkaline Phosphatase: 80 IU/L (ref 44–121)
Bilirubin Total: 0.3 mg/dL (ref 0.0–1.2)
Bilirubin, Direct: 0.17 mg/dL (ref 0.00–0.40)
Total Protein: 7.3 g/dL (ref 6.0–8.5)

## 2024-07-26 LAB — LIPID PANEL
Chol/HDL Ratio: 3.6 ratio (ref 0.0–4.4)
Cholesterol, Total: 299 mg/dL — ABNORMAL HIGH (ref 100–199)
HDL: 82 mg/dL (ref >39)
LDL Chol Calc (NIH): 154 mg/dL — ABNORMAL HIGH (ref 0–99)
Triglycerides: 345 mg/dL — ABNORMAL HIGH (ref 0–149)
VLDL Cholesterol Cal: 63 mg/dL — ABNORMAL HIGH (ref 5–40)

## 2024-07-30 ENCOUNTER — Encounter (HOSPITAL_COMMUNITY): Payer: Self-pay | Admitting: Internal Medicine

## 2024-07-30 ENCOUNTER — Ambulatory Visit (HOSPITAL_COMMUNITY)
Admission: RE | Admit: 2024-07-30 | Discharge: 2024-07-30 | Disposition: A | Discharge status: Home / Self Care | Source: Ambulatory Visit | Attending: Internal Medicine | Admitting: Internal Medicine

## 2024-07-30 VITALS — BP 160/78 | HR 66 | Ht 66.5 in | Wt 125.2 lb

## 2024-07-30 DIAGNOSIS — I480 Paroxysmal atrial fibrillation: Secondary | ICD-10-CM

## 2024-07-30 MED ORDER — METOPROLOL TARTRATE 25 MG PO TABS: 25.0000 mg | ORAL_TABLET | Freq: Two times a day (BID) | ORAL | 6 refills | Status: AC

## 2024-07-30 NOTE — Progress Notes (Signed)
 Primary Care Physician: Leonel Cole, MD Primary Cardiologist: Soyla DELENA Merck, MD Electrophysiologist: None     Referring Physician: PCP     Julie Mann is a 78 y.o. female with a history of CAD, HLD, and paroxysmal atrial fibrillation who presents for consultation in the [facility]. Patient noted to PCP that metoprolol  PRN not helping with Afib breakthrough. Patient is not on anticoagulation per patient choice. Patient has a CHADS2VASC score of 4.  On evaluation today, patient is currently in NSR. Patient notes she is doing well overall. Her main issue is that new Lopressor  prescription from PCP she received is not helping her. She says the metoprolol  that has been helping her with palpitations is an old prescription from [facility].  Today, she denies symptoms of chest pain, shortness of breath, orthopnea, PND, lower extremity edema, dizziness, presyncope, syncope, snoring, daytime somnolence, bleeding, or neurologic sequela. The patient is tolerating medications without difficulties and is otherwise without complaint today.    she has a BMI of Body mass index is 19.91 kg/m.SABRA Filed Weights   07/30/24 1126  Weight: 56.8 kg    Current Outpatient Medications  Medication Sig Dispense Refill   clobetasol  ointment (TEMOVATE ) 0.05 % Apply 1 Application topically 2 (two) times daily. 60 g 3   cyanocobalamin  (VITAMIN B12) 1000 MCG tablet Take 1 tablet (1,000 mcg total) by mouth daily. 30 tablet 2   ezetimibe  (ZETIA ) 10 MG tablet Take 1 tablet (10 mg total) by mouth daily. 90 tablet 3   ibuprofen  (ADVIL ) 600 MG tablet Take 1 tablet (600 mg total) by mouth every 8 (eight) hours as needed. 90 tablet 1   ibuprofen  (ADVIL ) 600 MG tablet Take 1 tablet (600 mg total) by mouth as needed. 30 tablet 1   ibuprofen  (ADVIL ) 600 MG tablet Take 1 tablet (600 mg total) by mouth as needed. 30 tablet 1   ibuprofen  (ADVIL ) 600 MG tablet Take 1 tablet (600 mg total) by mouth  as needed. 30 tablet 1   ibuprofen  (ADVIL ) 600 MG tablet Take 1 tablet (600 mg total) by mouth as needed. 30 tablet 1   influenza vaccine adjuvanted (FLUAD  QUADRIVALENT) 0.5 ML injection Inject 0.5 mLs into the muscle. 0.5 mL 0   LORazepam  (ATIVAN ) 1 MG tablet Take 1 tablet (1 mg total) by mouth at bedtime as needed for anxiety or sleep. 30 tablet 2   LORazepam  (ATIVAN ) 1 MG tablet Take 0.5-1 tablets (0.5-1 mg total) by mouth daily as needed. 30 tablet 0   LORazepam  (ATIVAN ) 1 MG tablet Take 1/2-1 tablets (0.5-1 mg total) by mouth daily as needed. 30 tablet 0   meloxicam  (MOBIC ) 15 MG tablet Take 1 tablet (15 mg total) by mouth daily with food. 30 tablet 0   metoprolol  tartrate (LOPRESSOR ) 25 MG tablet Take 1 tablet (25 mg total) by mouth 2 (two) times daily with food as needed for rapid pulse. 180 tablet 1   naproxen sodium (ALEVE) 220 MG tablet Take 220 mg by mouth daily as needed (Pain).     ondansetron  (ZOFRAN -ODT) 4 MG disintegrating tablet Dissolve 1 tablet (4 mg total) on the tongue 3 (three) times daily as needed for up to 3 days. 9 tablet 0   oxyCODONE -acetaminophen  (PERCOCET/ROXICET) 5-325 MG tablet Take 1 tablet by mouth every 4 (four) hours as needed. 30 tablet 0   rosuvastatin  (CRESTOR ) 5 MG tablet Take 1 tablet (5 mg total) by mouth daily. 90 tablet 3   rosuvastatin  (CRESTOR )  5 MG tablet Take 1 tablet (5 mg total) by mouth daily. 90 tablet 2   vitamin E 180 MG (400 UNITS) capsule Take 400 Units by mouth daily.     No current facility-administered medications for this encounter.    Atrial Fibrillation Management history:  Previous antiarrhythmic drugs: none Previous cardioversions: none Previous ablations: none Anticoagulation history: Xarelto    ROS- All systems are reviewed and negative except as per the HPI above.  Physical Exam: BP (!) 160/78   Pulse 66   Ht 5' 6.5 (1.689 m)   Wt 56.8 kg   LMP 11/14/1992   BMI 19.91 kg/m   GEN: Well nourished, well developed in  no acute distress NECK: No JVD; No carotid bruits CARDIAC: Regular rate and rhythm, no murmurs, rubs, gallops RESPIRATORY:  Clear to auscultation without rales, wheezing or rhonchi  ABDOMEN: Soft, non-tender, non-distended EXTREMITIES:  No edema; No deformity   EKG today demonstrates  Vent. rate 66 BPM PR interval 198 ms QRS duration 86 ms QT/QTcB 422/442 ms P-R-T axes 90 86 87 Normal sinus rhythm Normal ECG When compared with ECG of 01-Oct-2023 11:07, Previous ECG is present  Echo 11/17/2016 demonstrated  Study Conclusions   - Left ventricle: The cavity size was normal. Systolic function was    vigorous. The estimated ejection fraction was in the range of 65%    to 70%. Wall motion was normal; there were no regional wall    motion abnormalities. Features are consistent with a pseudonormal    left ventricular filling pattern, with concomitant abnormal    relaxation and increased filling pressure (grade 2 diastolic    dysfunction). Doppler parameters are consistent with    indeterminate ventricular filling pressure.  - Aortic valve: Transvalvular velocity was within the normal range.    There was no stenosis. There was no regurgitation.  - Mitral valve: Transvalvular velocity was within the normal range.    There was no evidence for stenosis. There was trivial    regurgitation.  - Right ventricle: The cavity size was normal. Wall thickness was    normal. Systolic function was normal.  - Tricuspid valve: There was trivial regurgitation.  - Pulmonary arteries: Systolic pressure was within the normal    range. PA peak pressure: 27 mm Hg (S).   ASSESSMENT & PLAN CHA2DS2-VASc Score = 4  The patient's score is based upon: CHF History: 0 HTN History: 0 Diabetes History: 0 Stroke History: 0 Vascular Disease History: 1 Age Score: 2 Gender Score: 1       ASSESSMENT AND PLAN: Paroxysmal Atrial Fibrillation (ICD10:  I48.0) The patient's CHA2DS2-VASc score is 4, indicating a  4.8% annual risk of stroke.    Patient is currently in NSR. Will send Lopressor  refill to [facility] per patient preference. She is happy overall with current management.  Secondary Hypercoagulable State (ICD10:  D68.69) The patient is at significant risk for stroke/thromboembolism based upon her CHA2DS2-VASc Score of 4.   Patient chooses to not be on anticoagulation despite risk of stroke.     Follow up 6 months Afib clinic.   [facility], [facility]  Afib Clinic [address] [place], [place] [address] [phone]

## 2024-08-05 ENCOUNTER — Other Ambulatory Visit (HOSPITAL_COMMUNITY): Payer: Self-pay

## 2024-08-05 ENCOUNTER — Ambulatory Visit: Payer: Self-pay | Admitting: Internal Medicine

## 2024-08-05 DIAGNOSIS — E7800 Pure hypercholesterolemia, unspecified: Secondary | ICD-10-CM

## 2024-08-06 ENCOUNTER — Other Ambulatory Visit (HOSPITAL_COMMUNITY): Payer: Self-pay

## 2024-08-06 ENCOUNTER — Encounter: Payer: Self-pay | Admitting: Physician Assistant

## 2024-08-06 ENCOUNTER — Other Ambulatory Visit: Payer: Self-pay

## 2024-08-12 ENCOUNTER — Other Ambulatory Visit: Payer: Self-pay

## 2024-08-14 ENCOUNTER — Other Ambulatory Visit (HOSPITAL_COMMUNITY): Payer: Self-pay

## 2024-08-14 MED ORDER — ROSUVASTATIN CALCIUM 5 MG PO TABS
5.0000 mg | ORAL_TABLET | Freq: Every day | ORAL | 3 refills | Status: DC
  Filled 2024-08-14: qty 90, 90d supply, fill #0

## 2024-08-16 ENCOUNTER — Other Ambulatory Visit (HOSPITAL_COMMUNITY): Payer: Self-pay

## 2024-08-16 MED ORDER — LORAZEPAM 1 MG PO TABS
0.5000 mg | ORAL_TABLET | ORAL | 0 refills | Status: DC | PRN
  Filled 2024-08-16: qty 30, 30d supply, fill #0

## 2024-08-22 ENCOUNTER — Inpatient Hospital Stay: Admitting: Internal Medicine

## 2024-08-22 ENCOUNTER — Telehealth: Payer: Self-pay

## 2024-08-22 ENCOUNTER — Inpatient Hospital Stay: Attending: Internal Medicine

## 2024-08-22 NOTE — Telephone Encounter (Signed)
 Patient missed her 3:30 afternoon appointment with Dr. Sherrod on 10/9.   I have left a voicemail to the patients mobile phone to check in on her and advise the patient to contact scheduling to RS her missed appointment.

## 2024-08-27 ENCOUNTER — Telehealth: Payer: Self-pay | Admitting: Internal Medicine

## 2024-08-27 NOTE — Telephone Encounter (Signed)
 Left patient a voicemail regarding scheduled appointments from 08/22/2024 that were no-showed.

## 2024-09-04 DIAGNOSIS — H52203 Unspecified astigmatism, bilateral: Secondary | ICD-10-CM | POA: Diagnosis not present

## 2024-09-04 DIAGNOSIS — Z961 Presence of intraocular lens: Secondary | ICD-10-CM | POA: Diagnosis not present

## 2024-09-04 DIAGNOSIS — H04123 Dry eye syndrome of bilateral lacrimal glands: Secondary | ICD-10-CM | POA: Diagnosis not present

## 2024-09-10 DIAGNOSIS — H43821 Vitreomacular adhesion, right eye: Secondary | ICD-10-CM | POA: Diagnosis not present

## 2024-09-10 DIAGNOSIS — H43813 Vitreous degeneration, bilateral: Secondary | ICD-10-CM | POA: Diagnosis not present

## 2024-09-10 DIAGNOSIS — H04123 Dry eye syndrome of bilateral lacrimal glands: Secondary | ICD-10-CM | POA: Diagnosis not present

## 2024-09-10 DIAGNOSIS — H35371 Puckering of macula, right eye: Secondary | ICD-10-CM | POA: Diagnosis not present

## 2024-09-13 ENCOUNTER — Telehealth: Payer: Self-pay | Admitting: Orthopedic Surgery

## 2024-09-13 ENCOUNTER — Other Ambulatory Visit (HOSPITAL_COMMUNITY): Payer: Self-pay

## 2024-09-13 MED ORDER — LORAZEPAM 1 MG PO TABS
0.5000 mg | ORAL_TABLET | Freq: Every day | ORAL | 0 refills | Status: DC | PRN
  Filled 2024-09-13: qty 30, 30d supply, fill #0

## 2024-09-13 NOTE — Telephone Encounter (Signed)
 CALLED PT LEFT VM PROVIDER OUT OF OFFICE NEED TO R/S/ with Harden.Rocky Peters

## 2024-09-16 ENCOUNTER — Encounter: Payer: Self-pay | Admitting: Radiology

## 2024-09-24 ENCOUNTER — Other Ambulatory Visit: Payer: Self-pay | Admitting: *Deleted

## 2024-09-25 ENCOUNTER — Inpatient Hospital Stay: Attending: Internal Medicine

## 2024-09-25 ENCOUNTER — Inpatient Hospital Stay: Admitting: Internal Medicine

## 2024-09-25 VITALS — BP 162/84 | HR 84 | Temp 97.6°F | Resp 17 | Ht 66.5 in | Wt 126.0 lb

## 2024-09-25 DIAGNOSIS — Z79899 Other long term (current) drug therapy: Secondary | ICD-10-CM | POA: Insufficient documentation

## 2024-09-25 DIAGNOSIS — M8580 Other specified disorders of bone density and structure, unspecified site: Secondary | ICD-10-CM | POA: Diagnosis not present

## 2024-09-25 DIAGNOSIS — E7800 Pure hypercholesterolemia, unspecified: Secondary | ICD-10-CM | POA: Insufficient documentation

## 2024-09-25 DIAGNOSIS — D75839 Thrombocytosis, unspecified: Secondary | ICD-10-CM | POA: Diagnosis present

## 2024-09-25 DIAGNOSIS — Z888 Allergy status to other drugs, medicaments and biological substances status: Secondary | ICD-10-CM | POA: Diagnosis not present

## 2024-09-25 DIAGNOSIS — Z881 Allergy status to other antibiotic agents status: Secondary | ICD-10-CM | POA: Diagnosis not present

## 2024-09-25 DIAGNOSIS — I480 Paroxysmal atrial fibrillation: Secondary | ICD-10-CM | POA: Insufficient documentation

## 2024-09-25 DIAGNOSIS — Z8711 Personal history of peptic ulcer disease: Secondary | ICD-10-CM | POA: Insufficient documentation

## 2024-09-25 DIAGNOSIS — Z91041 Radiographic dye allergy status: Secondary | ICD-10-CM | POA: Insufficient documentation

## 2024-09-25 DIAGNOSIS — Z885 Allergy status to narcotic agent status: Secondary | ICD-10-CM | POA: Insufficient documentation

## 2024-09-25 DIAGNOSIS — E538 Deficiency of other specified B group vitamins: Secondary | ICD-10-CM | POA: Diagnosis not present

## 2024-09-25 DIAGNOSIS — D649 Anemia, unspecified: Secondary | ICD-10-CM | POA: Diagnosis not present

## 2024-09-25 LAB — CBC WITH DIFFERENTIAL (CANCER CENTER ONLY)
Abs Immature Granulocytes: 0.01 K/uL (ref 0.00–0.07)
Basophils Absolute: 0 K/uL (ref 0.0–0.1)
Basophils Relative: 1 %
Eosinophils Absolute: 0.1 K/uL (ref 0.0–0.5)
Eosinophils Relative: 1 %
HCT: 41.5 % (ref 36.0–46.0)
Hemoglobin: 13.4 g/dL (ref 12.0–15.0)
Immature Granulocytes: 0 %
Lymphocytes Relative: 11 %
Lymphs Abs: 0.8 K/uL (ref 0.7–4.0)
MCH: 26.5 pg (ref 26.0–34.0)
MCHC: 32.3 g/dL (ref 30.0–36.0)
MCV: 82.2 fL (ref 80.0–100.0)
Monocytes Absolute: 0.4 K/uL (ref 0.1–1.0)
Monocytes Relative: 5 %
Neutro Abs: 6.3 K/uL (ref 1.7–7.7)
Neutrophils Relative %: 82 %
Platelet Count: 346 K/uL (ref 150–400)
RBC: 5.05 MIL/uL (ref 3.87–5.11)
RDW: 14.3 % (ref 11.5–15.5)
WBC Count: 7.5 K/uL (ref 4.0–10.5)
nRBC: 0 % (ref 0.0–0.2)

## 2024-09-25 LAB — CMP (CANCER CENTER ONLY)
ALT: 10 U/L (ref 0–44)
AST: 19 U/L (ref 15–41)
Albumin: 4.2 g/dL (ref 3.5–5.0)
Alkaline Phosphatase: 57 U/L (ref 38–126)
Anion gap: 7 (ref 5–15)
BUN: 12 mg/dL (ref 8–23)
CO2: 26 mmol/L (ref 22–32)
Calcium: 9.6 mg/dL (ref 8.9–10.3)
Chloride: 104 mmol/L (ref 98–111)
Creatinine: 0.91 mg/dL (ref 0.44–1.00)
GFR, Estimated: >60 mL/min (ref >60)
Glucose, Bld: 138 mg/dL — ABNORMAL HIGH (ref 70–99)
Potassium: 4.1 mmol/L (ref 3.5–5.1)
Sodium: 137 mmol/L (ref 135–145)
Total Bilirubin: 0.5 mg/dL (ref 0.0–1.2)
Total Protein: 7.2 g/dL (ref 6.5–8.1)

## 2024-09-25 LAB — IRON AND IRON BINDING CAPACITY (CC-WL,HP ONLY)
Iron: 96 ug/dL (ref 28–170)
Saturation Ratios: 27 % (ref 10.4–31.8)
TIBC: 356 ug/dL (ref 250–450)
UIBC: 260 ug/dL (ref 148–442)

## 2024-09-25 LAB — VITAMIN B12: Vitamin B-12: 369 pg/mL (ref 180–914)

## 2024-09-25 LAB — FOLATE: Folate: >20 ng/mL (ref >5.9)

## 2024-09-25 LAB — LACTATE DEHYDROGENASE: LDH: 141 U/L (ref 105–235)

## 2024-09-25 LAB — FERRITIN: Ferritin: 35 ng/mL (ref 11–307)

## 2024-09-25 NOTE — Progress Notes (Signed)
 [facility] [phone]   [phone]  OFFICE PROGRESS NOTE  Leonel Cole, MD 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 The patient also has mild anemia.  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 female returns to the clinic today for follow-up visit. Discussed the use of AI scribe software for clinical note transcription with the patient, who gave verbal consent to proceed.  History of Present Illness Julie Mann is an 78 year old female with thrombocytosis and mild anemia who presents for evaluation and repeat blood work.  She experiences persistent fatigue, which she attributes to poor sleep. She describes feeling constantly tired and has tried sleeping pills without success. No dizzy spells, chest pain, or breathing issues.  She has a history of thrombocytosis and mild anemia. She is on oral vitamin B12 supplements due to a history of B12 deficiency but admits to inconsistent use.    MEDICAL HISTORY: Past Medical History:  Diagnosis Date   Anal cancer (HCC) 2005   SCCa anus-stage II chemo, radiation   Anxiety    Arthritis    hands, fingers, back (02/27/2014)   Atrial fibrillation (HCC)    Cat scratch of right hand 02/24/2014   COPD (chronic obstructive pulmonary disease) (HCC)    Dyspnea    walking up hill;.  stairs (If Iam tired)   Dysrhythmia    PAF   Fall from horse 01/2018   Fracture, ankle 02/2019   Right    High cholesterol    History of stomach ulcers ~ 1966   Osteopenia     ALLERGIES:  is allergic to duloxetine , eliquis  [apixaban ], ivp dye [iodinated contrast media], oxycodone , tiotropium bromide, conjugated estrogens , doxycycline,  erythromycin, flonase [fluticasone propionate], hydrocodone , keflex [cephalexin], other, paxil [paroxetine hcl], prednisone , provera [medroxyprogesterone acetate], xarelto  [rivaroxaban ], zithromax [azithromycin], and zoloft [sertraline hcl].  MEDICATIONS:  Current Outpatient Medications  Medication Sig Dispense Refill   clobetasol  ointment (TEMOVATE ) 0.05 % Apply 1 Application topically 2 (two) times daily. 60 g 3   cyanocobalamin  (VITAMIN B12) 1000 MCG tablet Take 1 tablet (1,000 mcg total) by mouth daily. 30 tablet 2   ibuprofen  (ADVIL ) 600 MG tablet Take 1 tablet (600 mg total) by mouth every 8 (eight) hours as needed. 90 tablet 1   ibuprofen  (ADVIL ) 600 MG tablet Take 1 tablet (600 mg total) by mouth as needed. 30 tablet 1   ibuprofen  (ADVIL ) 600 MG tablet Take 1 tablet (600 mg total) by mouth as needed. 30 tablet 1   ibuprofen  (ADVIL ) 600 MG tablet Take 1 tablet (600 mg total) by mouth as needed. 30 tablet 1   ibuprofen  (ADVIL ) 600 MG tablet Take 1 tablet (600 mg total) by mouth as needed. 30 tablet 1   influenza vaccine adjuvanted (FLUAD  QUADRIVALENT) 0.5 ML injection Inject 0.5 mLs into the muscle. 0.5 mL 0   LORazepam  (ATIVAN ) 1 MG tablet Take 1 tablet (1 mg total) by mouth at bedtime as needed for anxiety or sleep. 30 tablet 2   LORazepam  (ATIVAN ) 1 MG tablet Take 0.5-1 tablets (0.5-1 mg total) by mouth daily as needed. 30 tablet 0   LORazepam  (ATIVAN ) 1 MG tablet Take 0.5-1 tablets (0.5-1 mg total) by mouth up to once  daily as needed. 30 tablet 0   meloxicam  (MOBIC ) 15 MG tablet Take 1 tablet (15 mg total) by mouth daily with food. 30 tablet 0   metoprolol  tartrate (LOPRESSOR ) 25 MG tablet Take 1 tablet (25 mg total) by mouth 2 (two) times daily with food as needed for rapid pulse. 60 tablet 6   naproxen sodium (ALEVE) 220 MG tablet Take 220 mg by mouth daily as needed (Pain).     ondansetron  (ZOFRAN -ODT) 4 MG disintegrating tablet Dissolve 1 tablet (4 mg total) on the tongue 3 (three)  times daily as needed for up to 3 days. 9 tablet 0   oxyCODONE -acetaminophen  (PERCOCET/ROXICET) 5-325 MG tablet Take 1 tablet by mouth every 4 (four) hours as needed. 30 tablet 0   rosuvastatin  (CRESTOR ) 5 MG tablet Take 1 tablet (5 mg total) by mouth daily. 90 tablet 3   vitamin E 180 MG (400 UNITS) capsule Take 400 Units by mouth daily.     No current facility-administered medications for this visit.    SURGICAL HISTORY:  Past Surgical History:  Procedure Laterality Date   ANKLE ARTHROSCOPY Right 09/27/2023   Procedure: RIGHT ANKLE ARTHROSCOPY AND DEBRIDEMENT;  Surgeon: Harden Jerona GAILS, MD;  Location: [facility] OR;  Service: Orthopedics;  Laterality: Right;   ANUS SURGERY  2005   biopsy   BREAST CYST EXCISION Right 1966   DILATION AND CURETTAGE OF UTERUS  1980's   S/P miscarriage   EXTERNAL FIXATION LEG Right 03/10/2019   Procedure: OPEN REDUCTION INTERNAL FIXATION RIGHT ANKLE;  Surgeon: Josefina Chew, MD;  Location: [facility] OR;  Service: Orthopedics;  Laterality: Right;   HARDWARE REMOVAL Right 02/05/2021   Procedure: REMOVAL OF HARDWARE RIGHT ANKLE;  Surgeon: Harden Jerona GAILS, MD;  Location: [facility] OR;  Service: Orthopedics;  Laterality: Right;   VARICOSE VEIN SURGERY Left    left leg   VIDEO BRONCHOSCOPY WITH ENDOBRONCHIAL NAVIGATION Right 08/25/2020   Procedure: VIDEO BRONCHOSCOPY WITH ENDOBRONCHIAL NAVIGATION WITH FIDUCIAL PLACEMENT;  Surgeon: Brenna Adine CROME, DO;  Location: [facility] OR;  Service: Pulmonary;  Laterality: Right;    REVIEW OF SYSTEMS:  A comprehensive review of systems was negative except for: Constitutional: positive for fatigue   PHYSICAL EXAMINATION: General appearance: alert, cooperative, fatigued, and no distress Head: Normocephalic, without obvious abnormality, atraumatic Neck: no adenopathy, no JVD, supple, symmetrical, trachea midline, and thyroid  not enlarged, symmetric, no tenderness/mass/nodules Lymph nodes: Cervical, supraclavicular, and axillary nodes normal. Resp: clear  to auscultation bilaterally Back: symmetric, no curvature. ROM normal. No CVA tenderness. Cardio: regular rate and rhythm, S1, S2 normal, no murmur, click, rub or gallop GI: soft, non-tender; bowel sounds normal; no masses,  no organomegaly Extremities: extremities normal, atraumatic, no cyanosis or edema  ECOG PERFORMANCE STATUS: 1 - Symptomatic but completely ambulatory  Blood pressure (!) 162/84, pulse 84, temperature 97.6 F (36.4 C), temperature source Temporal, resp. rate 17, height 5' 6.5 (1.689 m), weight 126 lb (57.2 kg), last menstrual period 11/14/1992, SpO2 100%.  LABORATORY DATA: Lab Results  Component Value Date   WBC 8.2 02/14/2024   HGB 13.0 02/14/2024   HCT 40.0 02/14/2024   MCV 80.5 02/14/2024   PLT 407 (H) 02/14/2024      Chemistry      Component Value Date/Time   NA 134 (L) 02/14/2024 1453   NA 138 12/08/2022 1038   K 4.0 02/14/2024 1453   CL 101 02/14/2024 1453   CO2 26 02/14/2024 1453   BUN 11 02/14/2024 1453   BUN 16 12/08/2022 1038  CREATININE 0.90 02/14/2024 1453      Component Value Date/Time   CALCIUM  9.8 02/14/2024 1453   ALKPHOS 80 07/25/2024 1505   AST 19 07/25/2024 1505   AST 18 02/14/2024 1453   ALT 10 07/25/2024 1505   ALT 8 02/14/2024 1453   BILITOT 0.3 07/25/2024 1505   BILITOT 0.4 02/14/2024 1453       RADIOGRAPHIC STUDIES: No results found.  ASSESSMENT AND PLAN: This is a 78 years old white female with thrombocytosis likely reactive in nature but essential thrombocythemia could not be completely excluded at this point. The patient also has mild anemia.  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  She had repeat CBC today that shows no concerning findings. Assessment and Plan Assessment & Plan Thrombocytosis, resolved Thrombocytosis has resolved with platelet count at 346, within normal range.  Anemia, resolved Anemia has resolved with hemoglobin level  at 13.4, within normal range.  Vitamin B12 deficiency, on supplementation Vitamin B12 deficiency is managed with oral supplementation. She reports inconsistent intake. - Encouraged consistent intake of vitamin B12 supplementation.  Hypertension Blood pressure remains elevated. She monitors blood pressure at home. - Advised to contact family doctor for medication adjustment if blood pressure remains high. She was advised to call if she has any concerning symptoms.  The patient voices understanding of current disease status and treatment options and is in agreement with the current care plan.  All questions were answered. The patient knows to call the clinic with any problems, questions or concerns. We can certainly see the patient much sooner if necessary.  The total time spent in the appointment was 20 minutes.  Disclaimer: This note was dictated with voice recognition software. Similar sounding words can inadvertently be transcribed and may not be corrected upon review.

## 2024-10-03 ENCOUNTER — Ambulatory Visit: Admitting: Orthopedic Surgery

## 2024-10-09 ENCOUNTER — Other Ambulatory Visit (HOSPITAL_COMMUNITY): Payer: Self-pay

## 2024-10-09 MED ORDER — LORAZEPAM 1 MG PO TABS
0.5000 mg | ORAL_TABLET | Freq: Every day | ORAL | 0 refills | Status: DC | PRN
  Filled 2024-10-09 – 2024-10-11 (×2): qty 30, 30d supply, fill #0

## 2024-10-11 ENCOUNTER — Other Ambulatory Visit (HOSPITAL_COMMUNITY): Payer: Self-pay

## 2024-10-14 ENCOUNTER — Other Ambulatory Visit (HOSPITAL_COMMUNITY): Payer: Self-pay

## 2024-10-14 ENCOUNTER — Ambulatory Visit: Admitting: Orthopedic Surgery

## 2024-10-15 ENCOUNTER — Encounter: Payer: Self-pay | Admitting: Physician Assistant

## 2024-10-15 ENCOUNTER — Ambulatory Visit: Admitting: Physician Assistant

## 2024-10-15 DIAGNOSIS — I872 Venous insufficiency (chronic) (peripheral): Secondary | ICD-10-CM | POA: Diagnosis not present

## 2024-10-15 DIAGNOSIS — M12571 Traumatic arthropathy, right ankle and foot: Secondary | ICD-10-CM

## 2024-10-15 MED ORDER — LIDOCAINE HCL 1 % IJ SOLN
2.0000 mL | INTRAMUSCULAR | Status: AC | PRN
  Administered 2024-10-15: 2 mL

## 2024-10-15 MED ORDER — METHYLPREDNISOLONE ACETATE 40 MG/ML IJ SUSP
40.0000 mg | INTRAMUSCULAR | Status: AC | PRN
  Administered 2024-10-15: 40 mg via INTRA_ARTICULAR

## 2024-10-15 NOTE — Progress Notes (Signed)
 Office Visit Note   Patient: Julie Mann           Date of Birth: [birth year]           MRN: [id number] Visit Date: 10/15/2024              Requested by: Leonel Cole, MD 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 Patient is status post a steroid injection right ankle  on 07/04/24. She states that this did help a little. She would like to have another injection.    She has a history of traumatic ankle trimalleolar right fracture requiring ORIF of the right ankle on 03/10/19.  On 02/05/21 the hard was removed secondary to infected hardware.  She developed increased pain in the ankle joint and underwent arthroscopy that revealed extensive traumatic arthritis with delamination of the cartilage off the tibial and talar joint. Gutters were full of scar tissue. Anterior scar tissue also causing impingement.   Assessment & Plan: Visit Diagnoses:  1. Traumatic arthritis of right ankle   2. Venous stasis dermatitis of both lower extremities     Plan: She received a right ankle injection today and tolerated this well.  WBAT, activity as tolerates.  Follow up PRN.  If the injections fail to help her pain she may need to consider ankle fusion.    Follow-Up Instructions: Return in about 2 months (around 12/16/2024).   Ortho Exam  Patient is alert, oriented, no adenopathy, well-dressed, normal affect, normal respiratory effort. Plantar flexion and dorsiflexion are intact, minimal inversion and eversion of the ankle.  Easily palpable DP pulse.  She has chronic dependent purplish foot discoloration due to venous insufficiency.  Painful area of the ankle is anterior lateral.      Imaging: No results found. No images are attached to the  encounter.  Labs: Lab Results  Component Value Date   REPTSTATUS 02/10/2021 FINAL 02/05/2021   GRAMSTAIN  02/05/2021    MODERATE WBC PRESENT,BOTH PMN AND MONONUCLEAR NO ORGANISMS SEEN    CULT  02/05/2021    RARE STAPHYLOCOCCUS CAPITIS RARE STAPHYLOCOCCUS EPIDERMIDIS NO ANAEROBES ISOLATED Performed at [facility], [address] [place], [place] [address]    [facility] STAPHYLOCOCCUS CAPITIS 02/05/2021   LABORGA STAPHYLOCOCCUS EPIDERMIDIS 02/05/2021     Lab Results  Component Value Date   ALBUMIN 4.2 09/25/2024   ALBUMIN 4.4 07/25/2024   ALBUMIN 4.3 04/01/2024    No results found for: MG No results found for: VD25OH  No results found for: PREALBUMIN    Latest Ref Rng & Units 09/25/2024   10:53 AM 02/14/2024    2:53 PM 10/01/2023   11:36 AM  CBC EXTENDED  WBC 4.0 - 10.5 K/uL 7.5  8.2  8.4   RBC 3.87 - 5.11 MIL/uL 5.05  4.97  4.80   Hemoglobin 12.0 - 15.0 g/dL 86.5  86.9  88.0   HCT 36.0 - 46.0 % 41.5  40.0  38.5   Platelets 150 - 400 K/uL 346  407  424   NEUT# 1.7 - 7.7 K/uL 6.3  6.4    Lymph# 0.7 - 4.0 K/uL 0.8  1.2       There is no  height or weight on file to calculate BMI.  Orders:  Orders Placed This Encounter  Procedures   Medium Joint Inj   No orders of the defined types were placed in this encounter.    Procedures: Medium Joint Inj: R ankle on 10/15/2024 12:20 PM Indications: pain and diagnostic evaluation Details: 22 G 1.5 in needle Medications: 2 mL lidocaine  1 %; 40 mg methylPREDNISolone  acetate 40 MG/ML Outcome: tolerated well, no immediate complications Procedure, treatment alternatives, risks and benefits explained, specific risks discussed. Consent was given by the patient. Immediately prior to procedure a time out was called to verify the correct patient, procedure, equipment, support staff and site/side marked as required. Patient was prepped and draped in the usual sterile fashion.      Clinical Data: No additional  findings.  ROS:  All other systems negative, except as noted in the HPI. Review of Systems  Objective: Vital Signs: LMP 11/14/1992   Specialty Comments:  EXAM: MRI LUMBAR SPINE WITHOUT CONTRAST   TECHNIQUE: Multiplanar, multisequence MR imaging of the lumbar spine was performed. No intravenous contrast was administered.   COMPARISON:  Lumbar spine MRI 05/30/2012   FINDINGS: Segmentation:  Standard.   Alignment: Slight lower lumbar levoscoliosis. No significant listhesis.   Vertebrae: No fracture or suspicious marrow lesion. Mild degenerative endplate edema at O4-D8.   Conus medullaris and cauda equina: Conus extends to the T12 level and is suboptimally evaluated as it was not covered on axial images. Unremarkable appearance of the cauda equina.   Paraspinal and other soft tissues: Small renal cysts.   Disc levels:   Disc desiccation throughout the lumbar spine with exception of L3-4. Progressive, severe disc space narrowing at L5-S1.   L1-2: Negative.   L2-3: At most minimal disc bulging without stenosis.   L3-4: Minimal leftward disc bulging without stenosis.   L4-5: Disc bulging and moderate facet and ligamentum flavum hypertrophy, mildly progressed but without associated stenosis. Small synovial cysts posterior to the facet joints bilaterally, not in a position to cause neural impingement.   L5-S1: Disc bulging, endplate spurring, disc space height loss, and mild facet hypertrophy without significant stenosis.   IMPRESSION: 1. Mild progression advanced disc degeneration at L5-S1 without stenosis. 2. Mildly progressive facet hypertrophy at L4-5 without stenosis.     Electronically Signed   By: Dasie Hamburg M.D.   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   Hardware complicating wound infection    Neck pain 10/05/2020   Nodule of upper lobe of right lung 08/17/2020   Biceps tendonosis of left shoulder 02/13/2020   Impingement syndrome of left shoulder 02/13/2020   Post-operative state 02/13/2020   Ankle dislocation, right, initial encounter 03/10/2019   Trimalleolar fracture of ankle, closed, right, initial encounter    Chronic left shoulder pain 02/19/2019   Anxiety 12/17/2016   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  stairs (If Iam tired)   Dysrhythmia    PAF   Fall from horse 01/2018   Fracture, ankle 02/2019   Right    High cholesterol    History of stomach ulcers ~ 1966   Osteopenia     Family History  Problem Relation Age of Onset   Breast cancer Maternal Aunt    Breast cancer Maternal Grandmother    Heart disease Mother    Stroke Father    Rectal cancer Paternal Grandmother    Breast cancer Sister 78   Breast cancer Sister 52    Past Surgical History:  Procedure Laterality Date   ANKLE ARTHROSCOPY Right 09/27/2023   Procedure: RIGHT ANKLE ARTHROSCOPY AND DEBRIDEMENT;  Surgeon: Harden Jerona GAILS, MD;  Location: [facility] OR;   Service: Orthopedics;  Laterality: Right;   ANUS SURGERY  2005   biopsy   BREAST CYST EXCISION Right 1966   DILATION AND CURETTAGE OF UTERUS  1980's   S/P miscarriage   EXTERNAL FIXATION LEG Right 03/10/2019   Procedure: OPEN REDUCTION INTERNAL FIXATION RIGHT ANKLE;  Surgeon: Josefina Chew, MD;  Location: [facility] OR;  Service: Orthopedics;  Laterality: Right;   HARDWARE REMOVAL Right 02/05/2021   Procedure: REMOVAL OF HARDWARE RIGHT ANKLE;  Surgeon: Harden Jerona GAILS, MD;  Location: [facility] OR;  Service: Orthopedics;  Laterality: Right;   VARICOSE VEIN SURGERY Left    left leg   VIDEO BRONCHOSCOPY WITH ENDOBRONCHIAL NAVIGATION Right 08/25/2020   Procedure: VIDEO BRONCHOSCOPY WITH ENDOBRONCHIAL NAVIGATION WITH FIDUCIAL PLACEMENT;  Surgeon: Brenna Adine CROME, DO;  Location: [facility] OR;  Service: Pulmonary;  Laterality: Right;   Social History   Occupational History   Occupation: Violinist  Tobacco Use   Smoking status: Former    Current packs/day: 0.00    Average packs/day: 1 pack/day for 30.0 years (30.0 ttl pk-yrs)    Types: Cigarettes    Start date: 37    Quit date: 2005    Years since quitting: 20.9   Smokeless tobacco: Never   Tobacco comments:    Former smoker 07/30/24  Vaping Use   Vaping status: Never Used  Substance and Sexual Activity   Alcohol use: No   Drug use: No   Sexual activity: Not Currently    Partners: Male    Birth control/protection: Post-menopausal

## 2024-10-25 ENCOUNTER — Ambulatory Visit: Admitting: Physician Assistant

## 2024-10-25 ENCOUNTER — Ambulatory Visit

## 2024-10-25 ENCOUNTER — Encounter: Payer: Self-pay | Admitting: Physician Assistant

## 2024-10-25 VITALS — BP 177/76 | HR 69 | Ht 67.0 in | Wt 124.0 lb

## 2024-10-25 DIAGNOSIS — R413 Other amnesia: Secondary | ICD-10-CM

## 2024-10-25 NOTE — Progress Notes (Signed)
 Memory Impairment of unclear etiology, concern for Alzheimer's disease   Julie Mann is a delightful 78 y.o. year old RH female with a history of HTN, hyperlipidemia, anxiety, PAF not on Eliquis , insomnia, subclinical hypothyroidism, multiple allergies,  mild thrombocytosis on B12 therapy seen today for evaluation of memory loss. MoCA today is 20/30.  Patient is able to participate in her ADLs without difficulties, including performing professionally as a violinist.  She continues to drive.  Etiology is unclear concerns for Alzheimer's disease are present, will need diagnostic clarity.  The patient is accompanied by husband who supplements  the history.  Mood is anxious.    Neuropsych evaluation for diagnostic clarity MRI of the brain for evaluation of structural abnormalities and vascular load Will entertain antidementia medication based on the above findings Continue to replenish B12 Follow-up in 3 months  Discussed the use of AI scribe software for clinical note transcription with the patient, who gave verbal consent to proceed.  History of Present Illness Julie Mann is an 78 year old female who presents with memory concerns.  She has been experiencing memory changes over the past two years, coinciding with her move from a large house to a smaller condo. She has difficulty with short-term memory, such as repeating questions and occasionally forgetting recent conversations, but her long-term memory is good.  She does repeat herself with questions and repeating stories.  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 She experiences no disorientation at home but occasionally misplaces items like her glasses.    She experiences insomnia and has been taking lorazepam  for years to aid sleep, which she finds helpful. There is no history of sleep apnea or significant changes in her sleep patterns, though stress can affect her sleep  quality.  She reports a significant weight loss of about eleven pounds since moving, attributed to her dislike of the gas stove in her new home, which has led to less frequent cooking. She takes multivitamins and vitamin A but does not use nutritional supplements like Ensure.  There have been no major head injuries or significant personality changes, though she feels more stressed due to her inability to walk following foot surgery after a severe foot injury five years ago,  did not heal properly, resulting in chronic pain and difficulty walking. This has impacted her ability to engage in physical activities like walking, though she continues to garden.  Her family history includes a paternal grandmother with dementia and a father who had a stroke. She denies any personal history of strokes, seizures, or major neurological conditions.  She uses glasses, she reports needing new ones in the future.    Recent labs 2025 TSH 6.31, free T4 0.7, B12 212   Allergies[1]  Current Outpatient Medications  Medication Instructions   cyanocobalamin  (VITAMIN B12) 1,000 mcg, Oral, Daily   ibuprofen  (ADVIL ) 600 mg, Oral, As needed   influenza vaccine adjuvanted (FLUAD  QUADRIVALENT) 0.5 ML injection 0.5 mLs, Intramuscular   LORazepam  (ATIVAN ) 1 MG tablet Take 1/2-1 tablets (0.5-1 mg total) by mouth daily as needed.   metoprolol  tartrate (LOPRESSOR ) 25 MG tablet Take 1 tablet (25 mg total) by mouth 2 (two) times daily with food as needed for rapid pulse.   naproxen sodium (ALEVE) 220 mg, Daily PRN   rosuvastatin  (CRESTOR ) 5 mg, Oral, Daily   vitamin E 400 Units, Daily     VITALS:   Vitals:   10/25/24 9057  BP: (!) 177/76  Pulse: 69  SpO2: 100%  Weight: 124 lb (56.2 kg)  Height: 5' 7 (1.702 m)     Neurological Exam     10/25/2024   11:00 AM  Montreal Cognitive Assessment   Visuospatial/ Executive (0/5) 2  Naming (0/3) 3  Attention: Read list of digits (0/2) 2  Attention: Read list of letters  (0/1) 1  Attention: Serial 7 subtraction starting at 100 (0/3) 3  Language: Repeat phrase (0/2) 2  Language : Fluency (0/1) 1  Abstraction (0/2) 0  Delayed Recall (0/5) 0  Orientation (0/6) 6  Total 20  Adjusted Score (based on education) 20        No data to display             Orientation:  Alert and oriented to person, place and time . No aphasia or dysarthria. Fund of knowledge is appropriate. Recent and remote memory impaired.  Attention and concentration are reduced .  Able to name objects and repeat phrases.   Delayed recall 0 /5 .  Cranial nerves: There is good facial symmetry Anxious appearing. Extraocular muscles are intact and visual fields are full to confrontational testing. Speech is fluent and clear. No tongue deviation. Hearing is intact to conversational tone.  Tone: Tone is good throughout. Sensation: Sensation is intact to light touch.  Vibration is intact at the bilateral big toe.  Coordination: The patient has no difficulty with RAM's or FNF bilaterally. Normal finger to nose  Motor: Strength is 5/5 in the bilateral upper and lower extremities. There is no pronator drift. There are no fasciculations noted. DTR's: Deep tendon reflexes are 2/4 bilaterally. Gait and Station: The patient is able to ambulate without difficulty. Gait is cautious and narrow. Stride length is normal.        Thank you for allowing us  the opportunity to participate in the care of this nice patient. Please do not hesitate to contact us  for any questions or concerns.   Total time spent on today's visit was 61 minutes dedicated to this patient today, preparing to see patient, examining the patient, ordering tests and/or medications and counseling the patient, documenting clinical information in the EHR or other health record, independently interpreting results and communicating results to the patient/family, discussing treatment and goals, answering patient's questions and coordinating  care.  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   Oxycodone  Other (See Comments)    Went into Afib.    Tiotropium Bromide Other (See Comments)    Headache, Blurry vision, chest heaviness.   Conjugated Estrogens      Felt weird Other reaction(s): hallucinations   Doxycycline Other (See Comments)    Dizziness and tingling   Erythromycin Other (See Comments)    Hallucinations    Flonase [Fluticasone Propionate]    Hydrocodone     Keflex [Cephalexin] Other (See Comments)    Hallucinations.   Other     Other reaction(s): hallucinations Other reaction(s): sedation Other reaction(s): hallucinations   Paxil [Paroxetine Hcl] Diarrhea   Prednisone      blurry eyes   Provera [Medroxyprogesterone Acetate]     Patient  do not remember reaction   Xarelto  [Rivaroxaban ]     Made her feel sick   Zithromax [Azithromycin] Other (See Comments)    hallucinations   Zoloft [Sertraline Hcl] Other (See Comments)    sedation

## 2024-10-25 NOTE — Patient Instructions (Signed)
 It was a pleasure to see you today at our office.   Recommendations:  Neurocognitive evaluation at our office   MRI of the brain, the radiology office will call you to arrange you appointment   Follow up January 23, 2025 at 11:30    [email or web address]   RECOMMENDATIONS FOR ALL PATIENTS WITH MEMORY PROBLEMS: 1. Continue to exercise (Recommend 30 minutes of walking everyday, or 3 hours every week) 2. Increase social interactions - continue going to [place] and enjoy social gatherings with friends and family 3. Eat healthy, avoid fried foods and eat more fruits and vegetables 4. Maintain adequate blood pressure, blood sugar, and blood cholesterol level. Reducing the risk of stroke and cardiovascular disease also helps promoting better memory. 5. Avoid stressful situations. Live a simple life and avoid aggravations. Organize your time and prepare for the next day in anticipation. 6. Sleep well, avoid any interruptions of sleep and avoid any distractions in the bedroom that may interfere with adequate sleep quality 7. Avoid sugar, avoid sweets as there is a strong link between excessive sugar intake, diabetes, and cognitive impairment We discussed the Mediterranean diet, which has been shown to help patients reduce the risk of progressive memory disorders and reduces cardiovascular risk. This includes eating fish, eat fruits and green leafy vegetables, nuts like almonds and hazelnuts, walnuts, and also use olive oil. Avoid fast foods and fried foods as much as possible. Avoid sweets and sugar as sugar use has been linked to worsening of memory function.  There is always a concern of gradual progression of memory problems. If this is the case, then we may need to adjust level of care according to patient needs. Support, both to the patient and caregiver, should then be put into place.      You have been referred for a neuropsychological  evaluation (i.e., evaluation of memory and thinking abilities). Please bring someone with you to this appointment if possible, as it is helpful for the doctor to hear from both you and another adult who knows you well. Please bring eyeglasses and hearing aids if you wear them.    The evaluation will take approximately 3 hours and has two parts:   The first part is a clinical interview with the neuropsychologist (Dr. Richie or Dr. Gayland). During the interview, the neuropsychologist will speak with you and the individual you brought to the appointment.    The second part of the evaluation is testing with the doctor's technician Neal or Luke). During the testing, the technician will ask you to remember different types of material, solve problems, and answer some questionnaires. Your family member will not be present for this portion of the evaluation.   Please note: We must reserve several hours of the neuropsychologist's time and the psychometrician's time for your evaluation appointment. As such, there is a No-Show fee of $100. If you are unable to attend any of your appointments, please contact our office as soon as possible to reschedule.      DRIVING: Regarding driving, in patients with progressive memory problems, driving will be impaired. We advise to have someone else do the driving if trouble finding directions or if minor accidents are reported. Independent driving assessment is available to determine safety of driving.   If you are interested in the driving assessment, you can contact the following:  [facility] in [place] [phone]  [facility] [phone]  [facility] [phone]  [facility] [phone] or [phone]   FALL PRECAUTIONS:  Be cautious when walking. Scan the area for obstacles that may increase the risk of trips and falls. When getting up in the mornings, sit up at the edge of the bed for a few minutes before  getting out of bed. Consider elevating the bed at the head end to avoid drop of blood pressure when getting up. Walk always in a well-lit room (use night lights in the walls). Avoid area rugs or power cords from appliances in the middle of the walkways. Use a walker or a cane if necessary and consider physical therapy for balance exercise. Get your eyesight checked regularly.  FINANCIAL OVERSIGHT: Supervision, especially oversight when making financial decisions or transactions is also recommended.  HOME SAFETY: Consider the safety of the kitchen when operating appliances like stoves, microwave oven, and blender. Consider having supervision and share cooking responsibilities until no longer able to participate in those. Accidents with firearms and other hazards in the house should be identified and addressed as well.   ABILITY TO BE LEFT ALONE: If patient is unable to contact 911 operator, consider using LifeLine, or when the need is there, arrange for someone to stay with patients. Smoking is a fire hazard, consider supervision or cessation. Risk of wandering should be assessed by caregiver and if detected at any point, supervision and safe proof recommendations should be instituted.  MEDICATION SUPERVISION: Inability to self-administer medication needs to be constantly addressed. Implement a mechanism to ensure safe administration of the medications.      Mediterranean Diet A Mediterranean diet refers to food and lifestyle choices that are based on the traditions of countries located on the [place]. This way of eating has been shown to help prevent certain conditions and improve outcomes for people who have chronic diseases, like kidney disease and heart disease. What are tips for following this plan? Lifestyle  Cook and eat meals together with your family, when possible. Drink enough fluid to keep your urine clear or pale yellow. Be physically active every day. This  includes: Aerobic exercise like running or swimming. Leisure activities like gardening, walking, or housework. Get 7-8 hours of sleep each night. If recommended by your health care provider, drink red wine in moderation. This means 1 glass a day for nonpregnant women and 2 glasses a day for men. A glass of wine equals 5 oz (150 mL). Reading food labels  Check the serving size of packaged foods. For foods such as rice and pasta, the serving size refers to the amount of cooked product, not dry. Check the total fat in packaged foods. Avoid foods that have saturated fat or trans fats. Check the ingredients list for added sugars, such as corn syrup. Shopping  At the grocery store, buy most of your food from the areas near the walls of the store. This includes: Fresh fruits and vegetables (produce). Grains, beans, nuts, and seeds. Some of these may be available in unpackaged forms or large amounts (in bulk). Fresh seafood. Poultry and eggs. Low-fat dairy products. Buy whole ingredients instead of prepackaged foods. Buy fresh fruits and vegetables in-season from local farmers markets. Buy frozen fruits and vegetables in resealable bags. If you do not have access to quality fresh seafood, buy precooked frozen shrimp or canned fish, such as tuna, salmon, or sardines. Buy small amounts of raw or cooked vegetables, salads, or olives from the deli or salad bar at your store. Stock your pantry so you always have certain foods on hand, such as olive oil, canned tuna,  canned tomatoes, rice, pasta, and beans. Cooking  Cook foods with extra-virgin olive oil instead of using butter or other vegetable oils. Have meat as a side dish, and have vegetables or grains as your main dish. This means having meat in small portions or adding small amounts of meat to foods like pasta or stew. Use beans or vegetables instead of meat in common dishes like chili or lasagna. Experiment with different cooking methods. Try  roasting or broiling vegetables instead of steaming or sauteing them. Add frozen vegetables to soups, stews, pasta, or rice. Add nuts or seeds for added healthy fat at each meal. You can add these to yogurt, salads, or vegetable dishes. Marinate fish or vegetables using olive oil, lemon juice, garlic, and fresh herbs. Meal planning  Plan to eat 1 vegetarian meal one day each week. Try to work up to 2 vegetarian meals, if possible. Eat seafood 2 or more times a week. Have healthy snacks readily available, such as: Vegetable sticks with hummus. Greek yogurt. Fruit and nut trail mix. Eat balanced meals throughout the week. This includes: Fruit: 2-3 servings a day Vegetables: 4-5 servings a day Low-fat dairy: 2 servings a day Fish, poultry, or lean meat: 1 serving a day Beans and legumes: 2 or more servings a week Nuts and seeds: 1-2 servings a day Whole grains: 6-8 servings a day Extra-virgin olive oil: 3-4 servings a day Limit red meat and sweets to only a few servings a month What are my food choices? Mediterranean diet Recommended Grains: Whole-grain pasta. Brown rice. Bulgar wheat. Polenta. Couscous. Whole-wheat bread. [place] Made[place]. Vegetables: Artichokes. Beets. Broccoli. Cabbage. Carrots. Eggplant. Green beans. Chard. Kale. Spinach. Onions. Leeks. Peas. Squash. Tomatoes. Peppers. Radishes. Fruits: Apples. Apricots. Avocado. Berries. Bananas. Cherries. Dates. Figs. Grapes. Lemons. Melon. Oranges. Peaches. Plums. Pomegranate. Meats and other protein foods: Beans. Almonds. Sunflower seeds. Pine nuts. Peanuts. Cod. Salmon. Scallops. Shrimp. Tuna. Tilapia. Clams. Oysters. Eggs. Dairy: Low-fat milk. Cheese. Greek yogurt. Beverages: Water. Red wine. Herbal tea. Fats and oils: Extra virgin olive oil. Avocado oil. Grape seed oil. Sweets and desserts: Greek yogurt with honey. Baked apples. Poached pears. Trail mix. Seasoning and other foods: Basil. Cilantro. Coriander. Cumin. Mint.  Parsley. Sage. Rosemary. Tarragon. Garlic. Oregano. Thyme. Pepper. Balsalmic vinegar. Tahini. Hummus. Tomato sauce. Olives. Mushrooms. Limit these Grains: Prepackaged pasta or rice dishes. Prepackaged cereal with added sugar. Vegetables: Deep fried potatoes (french fries). Fruits: Fruit canned in syrup. Meats and other protein foods: Beef. Pork. Lamb. Poultry with skin. Hot dogs. [place]. Dairy: Ice cream. Sour cream. Whole milk. Beverages: Juice. Sugar-sweetened soft drinks. Beer. Liquor and spirits. Fats and oils: Butter. Canola oil. Vegetable oil. Beef fat (tallow). Lard. Sweets and desserts: Cookies. Cakes. Pies. Candy. Seasoning and other foods: Mayonnaise. Premade sauces and marinades. The items listed may not be a complete list. Talk with your dietitian about what dietary choices are right for you. Summary The Mediterranean diet includes both food and lifestyle choices. Eat a variety of fresh fruits and vegetables, beans, nuts, seeds, and whole grains. Limit the amount of red meat and sweets that you eat. Talk with your health care provider about whether it is safe for you to drink red wine in moderation. This means 1 glass a day for nonpregnant women and 2 glasses a day for men. A glass of wine equals 5 oz (150 mL). This information is not intended to replace advice given to you by your health care provider. Make sure you discuss any questions you have with  your health care provider. Document Released: 06/23/2016 Document Revised: 07/26/2016 Document Reviewed: 06/23/2016 Elsevier Interactive Patient Education  2017 [employer].

## 2024-10-30 ENCOUNTER — Other Ambulatory Visit (HOSPITAL_COMMUNITY): Payer: Self-pay

## 2024-10-30 ENCOUNTER — Telehealth: Payer: Self-pay | Admitting: Internal Medicine

## 2024-10-30 DIAGNOSIS — E7800 Pure hypercholesterolemia, unspecified: Secondary | ICD-10-CM

## 2024-10-30 MED ORDER — ROSUVASTATIN CALCIUM 5 MG PO TABS
5.0000 mg | ORAL_TABLET | Freq: Every day | ORAL | 3 refills | Status: AC
  Filled 2024-10-30: qty 90, 90d supply, fill #0

## 2024-10-30 NOTE — Telephone Encounter (Signed)
 Refill sent

## 2024-10-30 NOTE — Telephone Encounter (Signed)
°*  STAT* If patient is at the pharmacy, call can be transferred to refill team.   1. Which medications need to be refilled? (please list name of each medication and dose if known) rosuvastatin  (CRESTOR ) 5 MG tablet    4. Which pharmacy/location (including street and city if local pharmacy) is medication to be sent to?  [facility] [place]     5. Do they need a 30 day or 90 day supply? 90

## 2024-11-11 ENCOUNTER — Other Ambulatory Visit (HOSPITAL_COMMUNITY): Payer: Self-pay

## 2024-11-11 MED ORDER — LORAZEPAM 1 MG PO TABS
0.5000 mg | ORAL_TABLET | ORAL | 0 refills | Status: DC | PRN
  Filled 2024-11-11: qty 30, 30d supply, fill #0

## 2024-11-18 ENCOUNTER — Ambulatory Visit
Admission: RE | Admit: 2024-11-18 | Discharge: 2024-11-18 | Disposition: A | Discharge status: Home / Self Care | Source: Ambulatory Visit | Attending: Physician Assistant | Admitting: Physician Assistant

## 2024-11-18 DIAGNOSIS — R413 Other amnesia: Secondary | ICD-10-CM

## 2024-11-21 ENCOUNTER — Ambulatory Visit: Payer: Self-pay

## 2024-11-21 ENCOUNTER — Ambulatory Visit: Payer: Self-pay | Admitting: Psychology

## 2024-11-21 DIAGNOSIS — F0670 Mild neurocognitive disorder due to known physiological condition without behavioral disturbance: Secondary | ICD-10-CM | POA: Diagnosis not present

## 2024-11-21 NOTE — Progress Notes (Signed)
 "  NEUROPSYCHOLOGICAL EVALUATION [place]. [facility]  [place] Department of Neurology  Date of Evaluation: 11/21/2024  REASON FOR REFERRAL   Julie Mann is an 79 year old, right-handed, White female with 16 years of formal education. She was referred for neuropsychological evaluation by Camie Sevin, PA-C, to assess current neurocognitive functioning, document potential cognitive deficits, and assist with treatment planning. This is her first neuropsychological evaluation.  SUMMARY OF RESULTS   Premorbid cognitive abilities are estimated to be in the high average to above average range based on word reading and sociodemographic factors. Relative to this baseline estimate, current performance was below expectations across tasks of learning/memory and was variable across executive functioning, language, and visuospatial abilities. In contrast, performance was intact on measures of working memory and processing speed.  Within the domain of executive functioning, she demonstrated slowed performance on a task of alternating attention and made one sequencing error and one shifting error. Response inhibition task was adequate; however, when a switching component was introduced, her response speed declined and she committed numerous errors, many of which were uncorrected. Other executive measures, including abstract reasoning and phonemic fluency, were intact. Semantic fluency was also intact, whereas confrontation naming was below expectations. With respect to visuospatial abilities, she performed well on visuoconstruction tasks but below expectations on a perceptual task requiring judgment of line orientations.  Regarding learning/memory, she exhibited poor encoding, recall, and recognition of both short stories and shapes. Although immediate recall of a word list was relatively stronger, her delayed recall and recognition of the list items was poor.  On self-report questionnaires, she  reported mild symptoms of anxiety and minimal symptoms of depression.  DIAGNOSTIC IMPRESSION   Results of the current evaluation indicated primary deficits in learning/memory, with some scattered variability across other cognitive domains. 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 At present, the clinical picture is concerning for an underlying neurodegenerative process, such as Alzheimer's disease.   During todays appointment, the patient was observed repeating herself multiple times within a short period without awareness and rapidly forgetting task instructions, often requiring them to be repeated several times even within a single task. Additionally, when asked questions about a prior task, she frequently needed reminders or memory cues to initiate recall. Once MRI results are available, it will be helpful to determine whether they correlate with this level of memory impairment. It will also be important to assess for cerebrovascular changes or other intracranial abnormalities that might contribute to cognitive symptoms. Additional neurological workup, such as blood-based biomarkers, may help support an Alzheimer's etiology. Repeat neuropsychological testing will also be valuable to monitor progression.  Other relevant background factors, while likely not the primary drivers of her memory difficulties, may exacerbate symptoms. These include chronic mood symptoms, sleep disturbance, and pain.  ICD-10 Codes: F06.70 Mild neurocognitive disorder (mild cognitive impairment)  RECOMMENDATIONS   A repeat neuropsychological evaluation in 12-18 months is recommended.  Discuss with your neurologist the risks and benefits of starting a medication that can help slow memory decline.  Consider having a trusted family member provide casual oversight of your medications. They can  step in if any issues arise, but if everything is going well, you can continue managing them on your own.  Given ongoing sleep difficulties, she may benefit from the implementation of sleep hygiene techniques, including:  Go to bed and get up at the same time each day to help your body establish a  regular rhythm. Establish and maintain a bedtime routine. Certain activities such as stretching, meditating, listening to soft music, or reading ~15 minutes before bedtime can be a great way to regularly get your brain and body ready for sleep. Avoid taking naps during the day. Avoid alcohol and caffeine for 5 or 6 hours before going to bed. Get regular exercise, but not in the hours before bedtime. Use comfortable bedding and maintain a cool temperature in your bedroom. Block out light and distracting noise. Avoid watching television or using your phone/computer in bed. Avoid staying in bed if you have difficulty falling asleep. If you have not been able to get to sleep after about 20 minutes or more, get up and do something calming or boring until you feel sleepy, then return to bed and try again.  Prioritize physical health through diet, exercise, and sleep. Regular physical activity supports cardiovascular health, improves mood, and helps preserve mobility and independence. Aim for at least 150 minutes of moderate aerobic exercise per week (e.g., brisk walking, swimming, gardening). A brain-healthy diet such as the Mediterranean or MIND diet is rich in fruits, vegetables, whole grains, healthy fats, and lean proteins, and has been associated with reduced risk of cognitive decline. Additionally, getting adequate, quality sleep and managing chronic conditions with the help of healthcare providers are essential components of healthy aging.  Continue to stay socially and mentally engaged. Maintaining strong social connections and regularly stimulating your brain can help protect against cognitive decline.  This includes staying connected with friends and family, volunteering, or participating in community groups. Mentally engaging activities--such as reading, doing puzzles, playing strategy games, or learning a new language or musical instrument--promote brain plasticity. If you are interested in activities to support cognitive engagement, this site offers a variety of apps and games organized by difficulty level:  https://www.barrowneuro.org/get-to-know-barrow/centers-programs/neurorehabilitation-center/neuro-rehab-apps-and-games/  Consider implementing compensatory strategies to maximize independence and maintain daily functioning. Examples include:  Adhere to routine. Compensatory strategies work best when they are used consistently. Use a planner, calendar, or white board that has the schedule and important events for the day clearly listed to reference and cross off when tasks are complete.  Ask for written information, especially if it is new or unfamiliar (e.g., information provided at a doctor's appointment).  Create an organized environment. Keep items that can be easily misplaced in a sensible location and get into the habit of always returning the items to those places. Pay attention and reduce distractions. Make a point of focusing attention on information you want to remember. One-on-one interaction is more likely to facilitate attention and minimize distraction. Make eye contact and repeat the information out loud after you hear it. Reduce interruptions or distractions especially when attempting to learn new information.  Create associations. When learning something new, think about and understand the information. Explain it in your own words or try to associate it with something you already know. Take notes to help remember important details. Evaluate goals and plan accordingly. When confronted by many different tasks, begin by making a list that prioritizes each task and estimates the time  it will take to complete. Break down complicated tasks into smaller, more manageable steps. Focus on one task at a time and complete each task before starting another. Avoid multitasking.  Performance across neurocognitive testing is not a strong predictor of an individual's safety operating a motor vehicle. Should her family wish to pursue a formalized driving evaluation, they could reach out to the following agencies:  [employer] in [place]: [phone] [facility] in [place]: [phone] [facility] in [place]: [phone] [facility] in [place]: [phone] or [phone]  Additional resources  If patient requires legal assistance with durable powers of attorney, medical decision making, long-term care resource access, or other aspects of estate planning, they may consider contacting [employer] at [phone] for a free consultation. The [facility] Division of Aging works to promote the independence and enhance the dignity of [place] 's older adults, persons with disabilities and their families, through a community-based system of opportunities, services, benefits and protections: geneblogs.si The Alzheimer's Association provides assistance and resources for individuals with a wide range of cognitive and functional impairments: limitlaws.hu  DISPOSITION   Patient will follow up with the referring provider, Ms. Wertman. She should return for repeat neuropsychological testing in 12-18 months to monitor her course and assist with diagnosis and treatment planning. She and her husband will be provided verbal feedback in approximately one week regarding the findings and impression during this visit.  The remainder of the report includes the details of the patient's background and a table of results from the current evaluation, which support the summary and recommendations described  above.  BACKGROUND   History of Presenting Illness: The following information was obtained from a review of medical records and an interview with the patient and her husband, Zell. Briefly, the patient was evaluated by Camie Sevin, PA-C, at [facility] on 10/25/2024 due to cognitive concerns over the past two years. Specifically, she reported occasionally repeating questions, forgetting recent conversations, and misplacing items. 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 MoCA = 20/30. She was referred for neuropsychological evaluation accordingly.  Cognitive Functioning: During todays appointment, the patient and her husband reported cognitive changes present for at least the past year. Their primary concern was short-term memory difficulties, including forgetting details of conversations and misplacing items. Husband also noted that the patient frequently repeats herself (e.g., asking what day of the week it is multiple times within a short period) and occasionally experiences word-finding difficulties. They otherwise denied significant concerns related to attention, processing speed, visuospatial abilities, or executive functioning.  Physical Functioning: Patient reporeported a longstanding history of poor sleep, which remains ongoing. She stated that Ativan  is helpful but does not fully resolve her sleep difficulties. Her appetite has declined, although she denied any changes in taste or smell. Vision is largely stable, though her husband noted occasional blurry vision. Hearing is also reported as stable. Husband reported that she experiences frequent headaches and often complains of them. During the clinical interview, however, the patient initially denied chronic headaches, stating they occur only intermittently. Later during testing, she reported on multiple occasions that she experiences significant headaches and relies on metoprolol  daily for  headache management. She endorsed balance difficulties attributed to a foot problem but denied any history of falls. She also denied tremors.  Emotional Functioning: Patient described her recent mood as okay, though she acknowledged feeling depressed about her foot condition, which limits her ability to engage in activities she enjoys, such as walking. She denied any suicidal ideation.  Neuroimaging: MRI of the brain was completed on 11/18/2024; report pending.  Other Relevant Medical History: Remarkable for atrial fibrillation, hypercholesterolemia, lumbar radiculopathy, and history of rectal cancer. Please refer to the medical record for a more comprehensive problem list. No history of stroke, CNS infection, head injury, or seizure was reported.  Current Medications: Per record, ibuprofen , lorazepam , metoprolol , naproxen, rosuvastatin , vitamin  B12, and vitamin E.   Functional Status: Patient continues to drive without any reported accidents or traffic violations. She has experienced one isolated episode of getting lost, which has not recurred. She manages her medications independently without reported difficulties. Finances are handled by her husband, which is a longstanding arrangement. She is able to prepare meals and operate household appliances without difficulty and remains independent in all basic activities of daily living.  Family Neurological History: Remarkable for dementia in her paternal grandmother and stroke in her father.  Psychiatric History: History of depression, anxiety, prior mental health treatment, suicidal ideation, hallucinations, and psychiatric hospitalizations was not reported.  Substance Use History: Patient denied current use of alcohol, nicotine, marijuana, and other illicit substances. Additionally, there is no reported history of past problematic substance use.  Social and Developmental History: Patient was born in [place], [place]. History of perinatal  complications and developmental delays was not reported. She is married and lives with her husband. They have one son.  Educational and Occupational History: No history of childhood learning disability, special education services, or grade retention was reported. 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 She reported that she was always strong in English but consistently struggled with math. She earned a probation officer in music. 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  BEHAVIORAL OBSERVATIONS   Patient arrived on time and was accompanied by her husband, Zell. She ambulated independently and without gait disturbance. She was alert and oriented to person and place but not to time (i.e., stated it was January 2021). She was appropriately groomed and dressed for the setting. No significant sensory or motor abnormalities were observed. Vision (with glasses) and hearing were adequate for testing purposes. Speech was of normal rate, prosody, and volume. No conversational word-finding difficulties, paraphasic errors, or dysarthria were observed. Comprehension was conversationally intact. Thought processes were generally linear, logical, and coherent; however, she frequently repeated herself within minutes throughout the appointment. Thought content was organized and devoid of delusions. Insight appeared fair. She at times contradicted herself; for example, she initially reported that she rarely experiences headaches despite her husbands report that they occur frequently. Later during testing, she took out a medication bottle at least five different times and stated it was for headaches, explaining that she gets them all the time, including at that moment. Affect was even and congruent with euthymic mood. She was cooperative and appeared to give adequate effort during testing, including  on embedded measures of performance validity. Results are thought to accurately reflect her cognitive functioning at this time.  NEUROPSYCHOLOGICAL TESTING RESULTS   Tests Administered: Animal Naming Test; Brief Visuospatial Memory Test-Revised (BVMT-R) - Form 1; [place]  Verbal Learning Test Third Edition (CVLT3) - Brief Form; Controlled Oral Word Association Test (COWAT): FAS; Delis-Kaplan Executive Function System (D-KEFS) - Subtest(s): Color-Word Interference Test; Geriatric Anxiety Scale-10 Item (GAS-10); Geriatric Depression Scale Short Form (GDS-SF); Neuropsychological Assessment Battery (NAB) - Subtest(s): Naming Form 1; Repeatable Battery for the Assessment of Neuropsychological Status Update (RBANS Update) Form A - Subtest(s): Line Orientation; Test of Premorbid Functioning (TOPF); Trail Making Test (TMT); Wechsler Adult Intelligence Scale Fifth Edition (WAIS-5) - Subtest(s): Similarities, Clinical Cytogeneticist, Digits Forward, Digit Sequencing, Coding, Symbol Search, Digits Backward; and Wechsler Memory Scale Fourth Edition (WMS-IV) - Subtest(s): Logical Memory (LM).  Test results are provided in the table below. Whenever possible, the patient's scores were compared  against age-, sex-, and education-corrected normative samples. Interpretive descriptions are based on the AACN consensus conference statement on uniform labeling (Guilmette et al., 2020).  PREMORBID FUNCTIONING RAW  RANGE  TOPF 62 StdS=121 Above Average  ATTENTION & WORKING MEMORY RAW  RANGE  WAIS-5 Digits Forward -- ss=10 Average  WAIS-5 Digits Backward -- ss=11 Average  WAIS-5 Digit Sequencing -- ss=9 Average  PROCESSING SPEED RAW  RANGE  Trails A 60''1e T=37 Low Average  WAIS-5 Coding  -- ss=8 Average  WAIS-5 Symbol Search -- ss=7 Low Average  DKEFS CWIT Color Naming 32''1e ss=12 High Average  DKEFS CWIT Word Reading 20''0e ss=13 High Average  EXECUTIVE FUNCTION RAW  RANGE  Trails B 344''2e T=20 Exceptionally Low  WAIS-5  Similarities -- ss=6 Low Average  COWAT Letter Fluency 14+11+12 T=44 Average  DKEFS CWIT Inhibition 83''3e ss=11 Average  DKEFS CWIT Inhibition/Switching 126''17e ss=7 Low Average  LANGUAGE RAW  RANGE  COWAT Letter Fluency 14+11+12 T=44 Average  Animal Naming Test 20 T=53 Average  NAB Naming Test 26/31 T=36 BNL  VISUOSPATIAL RAW  RANGE  RBANS Line Orientation -- 3-9%ile Below Average  WAIS-5 Block Design -- ss=11 Average  BVMT-R Copy Trial 11/12 -- WNL  VERBAL LEARNING & MEMORY RAW  RANGE  CVLT3 Total 1-4 (4+5+5+5)/36 StdS=87 Low Average  CVLT3 SDFR  3/9 ss=3 Exceptionally Low  CVLT3 LDFR  0/9 ss=1 Exceptionally Low  CVLT3 LDCR  2/9 ss=1 Exceptionally Low  CVLT3 Recognition Hits 8 ss=10 Average  CVLT3 Recognition False+ 6 ss=1 Exceptionally Low  CVLT3 Discriminability -- ss=3 Exceptionally Low  CVLT3 Intrusions 3 ss=8 Average  CVLT3 Repetitions 1 ss=10 Average  CVLT3 Forced Choice 9/9 -- WNL  WMS-IV LM-I  (4+6+2)/53 ss=5 Below Average  WMS-IV LM-II  (1+0)/39 ss=4 Below Average  WMS-IV LM Recognition  (4+7)/23 3-9%ile Below Average  VISUAL LEARNING & MEMORY RAW  RANGE  BVMT-R Trial 1 1/12 T=36 Below Average  BVMT-R Trial 2 2/12 T=34 Below Average  BVMT-R Trial 3 1/12 T=26 Exceptionally Low  BVMT-R Total Recall 4/36 T=30 Below Average  BVMT-R Delayed Recall 1/12 T=30 Below Average  BVMT-R Recognition Hits 3 -- --  BVMT-R Recognition False Alarms 0 -- --  BVMT-R Recognition Discrimination Index 3 T=29 Exceptionally Low  *From Powell et al. (2022) -- -- --  QUESTIONNAIRES RAW  RANGE  GDS-SF 4 -- Minimal  GAS-10 7 -- Mild  *Note: ss = scaled score; StdS = standard score; T = t-score; C/S = corrected raw score; WNL = within normal limits; BNL= below normal limits; D/C = discontinued. Scores from skewed distributions are typically interpreted as WNL (>=16th %ile) or BNL (<16th %ile).   INFORMED CONSENT   Patient was provided with a verbal description of the nature and purpose of  the neuropsychological evaluation. Also reviewed were the foreseeable risks and/or discomforts and benefits of the procedure, limits of confidentiality, and mandatory reporting requirements of this provider. Patient was given the opportunity to have their questions answered. Oral consent to participate was provided by the patient.   This report was prepared as part of a clinical evaluation and is not intended for forensic use.  SERVICE   This evaluation was conducted by Renda Beckwith, Psy.D. In addition to time spent directly with the patient, total professional time (120 minutes) includes record review, integration of relevant medical history, test selection, interpretation of findings, and report preparation. Additionally, all testing was administered and scored directly by Dr. Beckwith (110 minutes).  Psychiatric Diagnostic Evaluation Services (Professional): 09208  x 1 Neuropsychological Testing Evaluation Services (Professional): 03867 x 1 Neuropsychological Testing Evaluation Services (Professional): 03866 x 1 Neuropsychological Test Administration and Scoring: [phone] x 1 Neuropsychological Test Administration and Scoring: [phone] x 3  This report was generated using voice recognition software. While this document has been carefully reviewed, transcription errors may be present. I apologize in advance for any inconvenience. Please contact me if further clarification is needed.            Renda Beckwith, Psy.D.             Neuropsychologist  "

## 2024-11-25 ENCOUNTER — Other Ambulatory Visit (HOSPITAL_COMMUNITY): Payer: Self-pay

## 2024-11-26 ENCOUNTER — Other Ambulatory Visit (HOSPITAL_COMMUNITY): Payer: Self-pay

## 2024-11-26 MED ORDER — IBUPROFEN 600 MG PO TABS
600.0000 mg | ORAL_TABLET | ORAL | 1 refills | Status: DC | PRN
  Filled 2024-11-26: qty 30, 30d supply, fill #0

## 2024-11-26 MED ORDER — IBUPROFEN 600 MG PO TABS
600.0000 mg | ORAL_TABLET | Freq: Three times a day (TID) | ORAL | 1 refills | Status: AC | PRN
  Filled 2024-11-26 – 2024-11-27 (×2): qty 30, 10d supply, fill #0

## 2024-11-27 ENCOUNTER — Other Ambulatory Visit (HOSPITAL_COMMUNITY): Payer: Self-pay

## 2024-11-29 ENCOUNTER — Ambulatory Visit: Payer: Self-pay | Admitting: Psychology

## 2024-11-29 ENCOUNTER — Ambulatory Visit: Payer: Self-pay | Admitting: Physician Assistant

## 2024-11-29 DIAGNOSIS — F0670 Mild neurocognitive disorder due to known physiological condition without behavioral disturbance: Secondary | ICD-10-CM

## 2024-11-29 NOTE — Progress Notes (Signed)
" ° °  NEUROPSYCHOLOGY FEEDBACK SESSION [place]. [facility]  [place] Department of Neurology  Date of Feedback Session: 11/29/2024  REASON FOR REFERRAL   Julie Mann is an 79 year old, right-handed, White female with 16 years of formal education. She was referred for neuropsychological evaluation by Camie Sevin, PA-C, to assess current neurocognitive functioning, document potential cognitive deficits, and assist with treatment planning. This is her first neuropsychological evaluation.  FEEDBACK   Patient completed a comprehensive neuropsychological evaluation on 11/21/2024. Please refer to that encounter for the full report and recommendations. Briefly, results indicated primary deficits in learning/memory, with some scattered variability across other cognitive domains. 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 At present, the clinical picture is concerning for an underlying neurodegenerative process, such as Alzheimer's disease. Once MRI results are available, it will be helpful to determine whether they correlate with this level of memory impairment. It will also be important to assess for cerebrovascular changes or other intracranial abnormalities that might contribute to cognitive symptoms. Other relevant background factors, while likely not the primary drivers of her memory difficulties, may exacerbate symptoms. These include chronic mood symptoms, sleep disturbance, and pain.   Today, the patient was accompanied by her husband. They were provided verbal feedback regarding the findings and impression during this visit, and their questions were answered. A copy of the report was provided at the conclusion of the visit.  DISPOSITION   Patient will follow up with the referring provider, Ms. Wertman. She should return for repeat neuropsychological testing in  12-18 months to monitor her course and assist with diagnosis and treatment planning.  SERVICE   This feedback session was conducted by Renda Beckwith, Psy.D. [place] unit of 03867 (45 minutes) was billed for Dr. Beckwith' time spent in preparing, conducting, and documenting the current feedback session.  This report was generated using voice recognition software. While this document has been carefully reviewed, transcription errors may be present. I apologize in advance for any inconvenience. Please contact me if further clarification is needed.  "

## 2024-12-07 ENCOUNTER — Other Ambulatory Visit (HOSPITAL_COMMUNITY): Payer: Self-pay

## 2024-12-07 ENCOUNTER — Other Ambulatory Visit: Payer: Self-pay

## 2024-12-07 MED ORDER — LORAZEPAM 1 MG PO TABS
0.5000 mg | ORAL_TABLET | ORAL | 0 refills | Status: AC | PRN
  Filled 2024-12-07 – 2024-12-09 (×2): qty 30, 30d supply, fill #0

## 2024-12-09 ENCOUNTER — Other Ambulatory Visit (HOSPITAL_COMMUNITY): Payer: Self-pay

## 2025-01-08 ENCOUNTER — Ambulatory Visit: Admitting: Internal Medicine

## 2025-01-13 ENCOUNTER — Ambulatory Visit: Admitting: Physician Assistant

## 2025-01-23 ENCOUNTER — Ambulatory Visit: Payer: Self-pay | Admitting: Physician Assistant

## 2025-01-27 ENCOUNTER — Ambulatory Visit (HOSPITAL_COMMUNITY): Admitting: Internal Medicine
